# Patient Record
Sex: Male | Born: 1935 | Race: White | Hispanic: No | Marital: Married | Smoking: Former smoker
Health system: Southern US, Community
[De-identification: ages and names within clinical notes are randomized; demographics above are authoritative.]

## PROBLEM LIST (undated history)

## (undated) DIAGNOSIS — G4733 Obstructive sleep apnea (adult) (pediatric): Secondary | ICD-10-CM

## (undated) DIAGNOSIS — C8590 Non-Hodgkin lymphoma, unspecified, unspecified site: Secondary | ICD-10-CM

## (undated) DIAGNOSIS — G4730 Sleep apnea, unspecified: Secondary | ICD-10-CM

## (undated) DIAGNOSIS — H8110 Benign paroxysmal vertigo, unspecified ear: Secondary | ICD-10-CM

## (undated) DIAGNOSIS — N35919 Unspecified urethral stricture, male, unspecified site: Secondary | ICD-10-CM

## (undated) DIAGNOSIS — E119 Type 2 diabetes mellitus without complications: Secondary | ICD-10-CM

## (undated) DIAGNOSIS — F419 Anxiety disorder, unspecified: Secondary | ICD-10-CM

## (undated) DIAGNOSIS — R590 Localized enlarged lymph nodes: Secondary | ICD-10-CM

## (undated) DIAGNOSIS — D649 Anemia, unspecified: Secondary | ICD-10-CM

## (undated) DIAGNOSIS — E785 Hyperlipidemia, unspecified: Secondary | ICD-10-CM

## (undated) DIAGNOSIS — I10 Essential (primary) hypertension: Secondary | ICD-10-CM

## (undated) HISTORY — PX: PROSTATE SURGERY: SHX751

## (undated) HISTORY — PX: TONSILLECTOMY: SUR1361

## (undated) HISTORY — PX: BACK SURGERY: SHX140

---

## 2011-02-15 ENCOUNTER — Encounter: Payer: Self-pay | Admitting: *Deleted

## 2011-02-15 ENCOUNTER — Emergency Department (HOSPITAL_BASED_OUTPATIENT_CLINIC_OR_DEPARTMENT_OTHER)
Admission: EM | Admit: 2011-02-15 | Discharge: 2011-02-15 | Disposition: A | Discharge status: Home / Self Care | Payer: Medicare Other | Attending: Emergency Medicine | Admitting: Emergency Medicine

## 2011-02-15 DIAGNOSIS — F411 Generalized anxiety disorder: Secondary | ICD-10-CM | POA: Insufficient documentation

## 2011-02-15 DIAGNOSIS — I10 Essential (primary) hypertension: Secondary | ICD-10-CM | POA: Insufficient documentation

## 2011-02-15 DIAGNOSIS — N390 Urinary tract infection, site not specified: Secondary | ICD-10-CM | POA: Insufficient documentation

## 2011-02-15 DIAGNOSIS — E785 Hyperlipidemia, unspecified: Secondary | ICD-10-CM | POA: Insufficient documentation

## 2011-02-15 DIAGNOSIS — R300 Dysuria: Secondary | ICD-10-CM | POA: Insufficient documentation

## 2011-02-15 DIAGNOSIS — Z79899 Other long term (current) drug therapy: Secondary | ICD-10-CM | POA: Insufficient documentation

## 2011-02-15 HISTORY — DX: Hyperlipidemia, unspecified: E78.5

## 2011-02-15 HISTORY — DX: Anxiety disorder, unspecified: F41.9

## 2011-02-15 HISTORY — DX: Essential (primary) hypertension: I10

## 2011-02-15 LAB — URINE MICROSCOPIC-ADD ON

## 2011-02-15 LAB — URINALYSIS, ROUTINE W REFLEX MICROSCOPIC
Glucose, UA: NEGATIVE mg/dL
Ketones, ur: NEGATIVE mg/dL
Nitrite: POSITIVE — AB
Specific Gravity, Urine: 1.021 (ref 1.005–1.030)
pH: 5.5 (ref 5.0–8.0)

## 2011-02-15 MED ORDER — CIPROFLOXACIN HCL 500 MG PO TABS: 500.0000 mg | ORAL_TABLET | Freq: Two times a day (BID) | ORAL | Status: AC

## 2011-02-15 MED ORDER — CIPROFLOXACIN HCL 500 MG PO TABS
500.0000 mg | ORAL_TABLET | Freq: Once | ORAL | Status: AC
  Administered 2011-02-15: 500 mg via ORAL
  Filled 2011-02-15: qty 1

## 2011-02-15 NOTE — ED Notes (Signed)
Pt c/o painful urination.  

## 2011-02-15 NOTE — ED Provider Notes (Signed)
History     CSN: [id number] Arrival date & time: 02/15/2011 10:22 PM   First MD Initiated Contact with Patient 02/15/11 2243      Chief Complaint  Patient presents with  . Dysuria    (Consider location/radiation/quality/duration/timing/severity/associated sxs/prior treatment) HPI This is an 77 year old white male with a one-day history of burning with urination. He said he has some mild symptoms yesterday but they worsened today. He had a fever to 124 this afternoon which was relieved with an over-the-counter antipyretic the symptoms are moderate to severe. He has a history of urinary tract infections in the past but with the use of decreased in frequency following prostate surgery. He has had chills but no nausea, vomiting, diarrhea, flank pain, back pain or mental status changes.  Past Medical History  Diagnosis Date  . Hypertension   . Hyperlipemia   . Anxiety     Past Surgical History  Procedure Date  . Back surgery   . Prostate surgery   . Tonsillectomy     History reviewed. No pertinent family history.  History  Substance Use Topics  . Smoking status: Never Smoker   . Smokeless tobacco: Not on file  . Alcohol Use: No      Review of Systems  All other systems reviewed and are negative.    Allergies  Caffeine; Morphine and related; and Penicillins  Home Medications   Current Outpatient Rx  Name Route Sig Dispense Refill  . AMLODIPINE BESYLATE 10 MG PO TABS Oral Take 5 mg by mouth daily.      . ASPIRIN 81 MG PO TABS Oral Take 81 mg by mouth daily.      Marland Kitchen CLORAZEPATE DIPOTASSIUM 7.5 MG PO TABS Oral Take 7.5 mg by mouth daily.      Marland Kitchen LISINOPRIL 20 MG PO TABS Oral Take 20 mg by mouth daily.      Marland Kitchen METOPROLOL TARTRATE 50 MG PO TABS Oral Take 25 mg by mouth 2 (two) times daily.      Carma Leaven M PLUS PO TABS Oral Take 1 tablet by mouth daily.      . NYSTATIN 100000 UNIT/GM EX CREA Topical Apply 1 application topically 2 (two) times daily as needed. For jock  itch      . SIMVASTATIN PO Oral Take 1 tablet by mouth daily.        BP 130/57  Pulse 78  Temp(Src) 99 F (37.2 C) (Oral)  Resp 18  Ht 5\' 7"  (1.702 m)  Wt 180 lb (81.647 kg)  BMI 28.19 kg/m2  SpO2 100%  Physical Exam General: Well-developed, well-nourished male in no acute distress; appears younger than age of record HENT: normocephalic, atraumatic Eyes: pupils equal round and reactive to light; extraocular muscles intact Neck: supple Heart: regular rate and rhythm Lungs: clear to auscultation bilaterally Abdomen: soft; nontender; nondistended;  bowel sounds present GU: no CVA tenderness Extremities: No deformity; full range of motion; pulses normal; no edema Neurologic: Awake, alert and oriented; motor function intact in all extremities and symmetric; no facial droop Skin: Warm and dry Psychiatric: Normal mood and affect    ED Course  Procedures (including critical care time)     MDM   Nursing notes and vitals signs, including pulse oximetry, reviewed.  Summary of this visit's results, reviewed by myself:  Labs:  Results for orders placed during the hospital encounter of 02/15/11  URINALYSIS, ROUTINE W REFLEX MICROSCOPIC      Component Value Range   Color, Urine AMBER (*)  YELLOW    Appearance TURBID (*) CLEAR    Specific Gravity, Urine 1.021  1.005 - 1.030    pH 5.5  5.0 - 8.0    Glucose, UA NEGATIVE  NEGATIVE (mg/dL)   Hgb urine dipstick LARGE (*) NEGATIVE    Bilirubin Urine NEGATIVE  NEGATIVE    Ketones, ur NEGATIVE  NEGATIVE (mg/dL)   Protein, ur 161 (*) NEGATIVE (mg/dL)   Urobilinogen, UA 0.2  0.0 - 1.0 (mg/dL)   Nitrite POSITIVE (*) NEGATIVE    Leukocytes, UA LARGE (*) NEGATIVE   URINE MICROSCOPIC-ADD ON      Component Value Range   Squamous Epithelial / LPF FEW (*) RARE    WBC, UA TOO NUMEROUS TO COUNT  <3 (WBC/hpf)   RBC / HPF TOO NUMEROUS TO COUNT  <3 (RBC/hpf)   Bacteria, UA MANY (*) RARE              Hanley Seamen, MD 02/15/11  [phone]

## 2011-02-18 LAB — URINE CULTURE: Culture  Setup Time: 201211030637

## 2011-02-19 NOTE — ED Notes (Signed)
+   Urine Patient treated with Cipro-sensitive to same-chart appended per protocol MD. 

## 2014-01-05 ENCOUNTER — Encounter (HOSPITAL_COMMUNITY): Payer: Self-pay | Admitting: Emergency Medicine

## 2014-01-05 ENCOUNTER — Emergency Department (HOSPITAL_COMMUNITY): Payer: Medicare Other

## 2014-01-05 ENCOUNTER — Inpatient Hospital Stay (HOSPITAL_COMMUNITY)
Admission: EM | Admit: 2014-01-05 | Discharge: 2014-01-12 | DRG: 193 | Disposition: A | Discharge status: Home Health | Payer: Medicare Other | Attending: Internal Medicine | Admitting: Internal Medicine

## 2014-01-05 DIAGNOSIS — T380X5A Adverse effect of glucocorticoids and synthetic analogues, initial encounter: Secondary | ICD-10-CM | POA: Diagnosis not present

## 2014-01-05 DIAGNOSIS — E883 Tumor lysis syndrome: Secondary | ICD-10-CM | POA: Diagnosis not present

## 2014-01-05 DIAGNOSIS — N058 Unspecified nephritic syndrome with other morphologic changes: Secondary | ICD-10-CM | POA: Diagnosis present

## 2014-01-05 DIAGNOSIS — C8589 Other specified types of non-Hodgkin lymphoma, extranodal and solid organ sites: Secondary | ICD-10-CM | POA: Diagnosis present

## 2014-01-05 DIAGNOSIS — Z7982 Long term (current) use of aspirin: Secondary | ICD-10-CM

## 2014-01-05 DIAGNOSIS — R7402 Elevation of levels of lactic acid dehydrogenase (LDH): Secondary | ICD-10-CM | POA: Diagnosis present

## 2014-01-05 DIAGNOSIS — R740 Nonspecific elevation of levels of transaminase and lactic acid dehydrogenase [LDH]: Secondary | ICD-10-CM

## 2014-01-05 DIAGNOSIS — E785 Hyperlipidemia, unspecified: Secondary | ICD-10-CM | POA: Diagnosis present

## 2014-01-05 DIAGNOSIS — N183 Chronic kidney disease, stage 3 unspecified: Secondary | ICD-10-CM | POA: Diagnosis present

## 2014-01-05 DIAGNOSIS — M7989 Other specified soft tissue disorders: Secondary | ICD-10-CM | POA: Diagnosis present

## 2014-01-05 DIAGNOSIS — D72829 Elevated white blood cell count, unspecified: Secondary | ICD-10-CM | POA: Diagnosis present

## 2014-01-05 DIAGNOSIS — E119 Type 2 diabetes mellitus without complications: Secondary | ICD-10-CM

## 2014-01-05 DIAGNOSIS — D6959 Other secondary thrombocytopenia: Secondary | ICD-10-CM | POA: Diagnosis present

## 2014-01-05 DIAGNOSIS — J90 Pleural effusion, not elsewhere classified: Secondary | ICD-10-CM | POA: Diagnosis present

## 2014-01-05 DIAGNOSIS — E875 Hyperkalemia: Secondary | ICD-10-CM | POA: Diagnosis present

## 2014-01-05 DIAGNOSIS — N35919 Unspecified urethral stricture, male, unspecified site: Secondary | ICD-10-CM | POA: Diagnosis present

## 2014-01-05 DIAGNOSIS — F411 Generalized anxiety disorder: Secondary | ICD-10-CM | POA: Diagnosis present

## 2014-01-05 DIAGNOSIS — Z79899 Other long term (current) drug therapy: Secondary | ICD-10-CM | POA: Diagnosis not present

## 2014-01-05 DIAGNOSIS — G4733 Obstructive sleep apnea (adult) (pediatric): Secondary | ICD-10-CM | POA: Diagnosis present

## 2014-01-05 DIAGNOSIS — J9601 Acute respiratory failure with hypoxia: Secondary | ICD-10-CM | POA: Diagnosis present

## 2014-01-05 DIAGNOSIS — R634 Abnormal weight loss: Secondary | ICD-10-CM | POA: Diagnosis present

## 2014-01-05 DIAGNOSIS — T8089XA Other complications following infusion, transfusion and therapeutic injection, initial encounter: Secondary | ICD-10-CM | POA: Diagnosis not present

## 2014-01-05 DIAGNOSIS — R0902 Hypoxemia: Secondary | ICD-10-CM

## 2014-01-05 DIAGNOSIS — C8590 Non-Hodgkin lymphoma, unspecified, unspecified site: Secondary | ICD-10-CM

## 2014-01-05 DIAGNOSIS — R7401 Elevation of levels of liver transaminase levels: Secondary | ICD-10-CM | POA: Diagnosis present

## 2014-01-05 DIAGNOSIS — J189 Pneumonia, unspecified organism: Secondary | ICD-10-CM | POA: Diagnosis present

## 2014-01-05 DIAGNOSIS — Y849 Medical procedure, unspecified as the cause of abnormal reaction of the patient, or of later complication, without mention of misadventure at the time of the procedure: Secondary | ICD-10-CM | POA: Diagnosis not present

## 2014-01-05 DIAGNOSIS — I129 Hypertensive chronic kidney disease with stage 1 through stage 4 chronic kidney disease, or unspecified chronic kidney disease: Secondary | ICD-10-CM | POA: Diagnosis present

## 2014-01-05 DIAGNOSIS — E1129 Type 2 diabetes mellitus with other diabetic kidney complication: Secondary | ICD-10-CM | POA: Diagnosis present

## 2014-01-05 DIAGNOSIS — J9600 Acute respiratory failure, unspecified whether with hypoxia or hypercapnia: Secondary | ICD-10-CM | POA: Diagnosis present

## 2014-01-05 DIAGNOSIS — I4891 Unspecified atrial fibrillation: Secondary | ICD-10-CM | POA: Diagnosis present

## 2014-01-05 DIAGNOSIS — C8330 Diffuse large B-cell lymphoma, unspecified site: Secondary | ICD-10-CM | POA: Diagnosis present

## 2014-01-05 DIAGNOSIS — N179 Acute kidney failure, unspecified: Secondary | ICD-10-CM | POA: Diagnosis present

## 2014-01-05 DIAGNOSIS — Z6829 Body mass index (BMI) 29.0-29.9, adult: Secondary | ICD-10-CM

## 2014-01-05 DIAGNOSIS — R0602 Shortness of breath: Secondary | ICD-10-CM | POA: Diagnosis present

## 2014-01-05 DIAGNOSIS — I10 Essential (primary) hypertension: Secondary | ICD-10-CM

## 2014-01-05 HISTORY — DX: Unspecified urethral stricture, male, unspecified site: N35.919

## 2014-01-05 HISTORY — DX: Type 2 diabetes mellitus without complications: E11.9

## 2014-01-05 HISTORY — DX: Anemia, unspecified: D64.9

## 2014-01-05 HISTORY — DX: Localized enlarged lymph nodes: R59.0

## 2014-01-05 HISTORY — DX: Obstructive sleep apnea (adult) (pediatric): G47.33

## 2014-01-05 HISTORY — DX: Benign paroxysmal vertigo, unspecified ear: H81.10

## 2014-01-05 HISTORY — DX: Non-Hodgkin lymphoma, unspecified, unspecified site: C85.90

## 2014-01-05 LAB — COMPREHENSIVE METABOLIC PANEL
ALK PHOS: 100 U/L (ref 39–117)
ALT: 20 U/L (ref 0–53)
ANION GAP: 13 (ref 5–15)
AST: 75 U/L — ABNORMAL HIGH (ref 0–37)
Albumin: 2.3 g/dL — ABNORMAL LOW (ref 3.5–5.2)
BILIRUBIN TOTAL: 0.4 mg/dL (ref 0.3–1.2)
BUN: 38 mg/dL — AB (ref 6–23)
CO2: 23 meq/L (ref 19–32)
Calcium: 8.4 mg/dL (ref 8.4–10.5)
Chloride: 101 mEq/L (ref 96–112)
Creatinine, Ser: 2.23 mg/dL — ABNORMAL HIGH (ref 0.50–1.35)
GFR, EST AFRICAN AMERICAN: 29 mL/min — AB (ref >90)
GFR, EST NON AFRICAN AMERICAN: 25 mL/min — AB (ref >90)
GLUCOSE: 135 mg/dL — AB (ref 70–99)
POTASSIUM: 5.5 meq/L — AB (ref 3.7–5.3)
Sodium: 137 mEq/L (ref 137–147)
TOTAL PROTEIN: 6 g/dL (ref 6.0–8.3)

## 2014-01-05 LAB — CBC WITH DIFFERENTIAL/PLATELET
BASOS ABS: 0.2 10*3/uL — AB (ref 0.0–0.1)
Basophils Relative: 1 % (ref 0–1)
EOS PCT: 1 % (ref 0–5)
Eosinophils Absolute: 0.2 10*3/uL (ref 0.0–0.7)
HCT: 35.3 % — ABNORMAL LOW (ref 39.0–52.0)
Hemoglobin: 11.8 g/dL — ABNORMAL LOW (ref 13.0–17.0)
LYMPHS ABS: 7.3 10*3/uL — AB (ref 0.7–4.0)
Lymphocytes Relative: 35 % (ref 12–46)
MCH: 29.4 pg (ref 26.0–34.0)
MCHC: 33.4 g/dL (ref 30.0–36.0)
MCV: 87.8 fL (ref 78.0–100.0)
MONO ABS: 1.7 10*3/uL — AB (ref 0.1–1.0)
Monocytes Relative: 8 % (ref 3–12)
Neutro Abs: 11.5 10*3/uL — ABNORMAL HIGH (ref 1.7–7.7)
Neutrophils Relative %: 55 % (ref 43–77)
PLATELETS: 156 10*3/uL (ref 150–400)
RBC: 4.02 MIL/uL — ABNORMAL LOW (ref 4.22–5.81)
RDW: 15 % (ref 11.5–15.5)
WBC: 20.9 10*3/uL — ABNORMAL HIGH (ref 4.0–10.5)

## 2014-01-05 LAB — PRO B NATRIURETIC PEPTIDE: PRO B NATRI PEPTIDE: 343.7 pg/mL (ref 0–450)

## 2014-01-05 LAB — APTT: aPTT: 26 seconds (ref 24–37)

## 2014-01-05 LAB — BLOOD GAS, ARTERIAL
ACID-BASE EXCESS: 0.4 mmol/L (ref 0.0–2.0)
BICARBONATE: 24.5 meq/L — AB (ref 20.0–24.0)
Drawn by: 232811
FIO2: 0.21 %
O2 Saturation: 87.2 %
PCO2 ART: 40 mmHg (ref 35.0–45.0)
PO2 ART: 54.2 mmHg — AB (ref 80.0–100.0)
Patient temperature: 98.6
TCO2: 22.3 mmol/L (ref 0–100)
pH, Arterial: 7.405 (ref 7.350–7.450)

## 2014-01-05 LAB — PROTIME-INR
INR: 1.17 (ref 0.00–1.49)
Prothrombin Time: 14.9 seconds (ref 11.6–15.2)

## 2014-01-05 NOTE — ED Provider Notes (Signed)
CSN: [id number]     Arrival date & time 01/05/14  1952 History   First MD Initiated Contact with Patient 01/05/14 2014     Chief Complaint  Patient presents with  . Weakness  . Shortness of Breath     (Consider location/radiation/quality/duration/timing/severity/associated sxs/prior Treatment) HPI Patient reports he was having abdominal pain. During the course of his evaluation he had a CT of his abdomen  done in [facility] and he reports he had lymphadenopathy in his abdomen and also in his chest. He had a biopsy done of the lymph nodes in his abdomen which showed he has diffuse large B cell lymphoma. He reports he just got his diagnosis yesterday. He reports having night sweats,  progressive weakness to the point he is having trouble walking, dyspnea on exertion and  even dyspnea at rest. He has had decreased appetite. He has had abdominal distention. He denies nausea, vomiting, or diarrhea. He has a mild cough and did cough up some yellow mucus today once. He had a fever last week up to 100.2, he denies chills. He denies dysuria. He states he does have trouble starting to urinate. He had a TURP done in 2001. Patient states he has his first oncology appointment tomorrow with Dr. Alvy Bimler    PCP Dr Shirlean Schlein in [facility]  Past Medical History  Diagnosis Date  . Hypertension   . Hyperlipemia   . Anxiety   . Lymphadenopathy, abdominal   . Urethral stricture   . Anemia   . BPPV (benign paroxysmal positional vertigo)   . Lymphoma   . Diabetes mellitus without complication   . Obstructive sleep apnea    Past Surgical History  Procedure Laterality Date  . Back surgery    . Prostate surgery    . Tonsillectomy     History reviewed. No pertinent family history. History  Substance Use Topics  . Smoking status: Never Smoker   . Smokeless tobacco: Not on file  . Alcohol Use: No  lives at home Lives with spouse  Review of Systems  All other systems reviewed and are  negative.     Allergies  Caffeine; Morphine and related; and Penicillins  Home Medications   Prior to Admission medications   Medication Sig Start Date End Date Taking? Authorizing Provider  amLODipine (NORVASC) 10 MG tablet Take 5 mg by mouth daily.     Yes Historical Provider, MD  aspirin 81 MG tablet Take 81 mg by mouth daily.     Yes Historical Provider, MD  clorazepate (TRANXENE) 7.5 MG tablet Take 7.5 mg by mouth 2 (two) times daily as needed for anxiety or sleep.   Yes Historical Provider, MD  clotrimazole-betamethasone (LOTRISONE) cream Apply 1 application topically 2 (two) times daily.   Yes Historical Provider, MD  lisinopril (PRINIVIL,ZESTRIL) 20 MG tablet Take 20 mg by mouth daily.     Yes Historical Provider, MD  metoprolol (LOPRESSOR) 50 MG tablet Take 25 mg by mouth 2 (two) times daily.     Yes Historical Provider, MD  simvastatin (ZOCOR) 20 MG tablet Take 20 mg by mouth daily.   Yes Historical Provider, MD  traMADol (ULTRAM) 50 MG tablet Take 50 mg by mouth every 6 (six) hours as needed for moderate pain.   Yes Historical Provider, MD   BP 106/53  Pulse 85  Temp(Src) 98.2 F (36.8 C) (Oral)  Resp 19  Ht 5\' 7"  (1.702 m)  Wt 188 lb (85.276 kg)  BMI 29.44 kg/m2  SpO2 90%  Vital signs normal   Physical Exam  Nursing note and vitals reviewed. Constitutional: He is oriented to person, place, and time. He appears well-developed and well-nourished.  Non-toxic appearance. He does not appear ill. No distress.  HENT:  Head: Normocephalic and atraumatic.  Right Ear: External ear normal.  Left Ear: External ear normal.  Nose: Nose normal. No mucosal edema or rhinorrhea.  Mouth/Throat: Oropharynx is clear and moist and mucous membranes are normal. No dental abscesses or uvula swelling.  Eyes: Conjunctivae and EOM are normal. Pupils are equal, round, and reactive to light.  Neck: Normal range of motion and full passive range of motion without pain. Neck supple.   Cardiovascular: Normal rate, regular rhythm and normal heart sounds.  Exam reveals no gallop and no friction rub.   No murmur heard. Pulmonary/Chest: He is in respiratory distress. He has decreased breath sounds. He has no wheezes. He has no rhonchi. He has no rales. He exhibits no tenderness and no crepitus.  Patient's pulse ox varies from 89-91% while talking and at rest  Abdominal: Soft. Normal appearance and bowel sounds are normal. He exhibits distension. There is no tenderness. There is no rebound and no guarding.  Musculoskeletal: Normal range of motion. He exhibits no edema and no tenderness.  Moves all extremities well.   Neurological: He is alert and oriented to person, place, and time. He has normal strength. No cranial nerve deficit.  Skin: Skin is warm, dry and intact. No rash noted. No erythema. No pallor.  Psychiatric: He has a normal mood and affect. His speech is normal and behavior is normal. His mood appears not anxious.    ED Course  Procedures (including critical care time)  Medications - No data to display   D/W Radiology, he is able to see the AP CT from 8/31. States no need to repeat the AP CT, we were going to do CT angio chest but patient now has renal insufficiency. He was able to have contrast with his CT done on the 31st so the renal insuffiencey must be new. CT scan of chest without contrast ordered.   Pt placed on [place] oxygen 2 lpm [place].   23:28 Dr Posey Pronto, admit to tele, team 8  Pt and family given test results. Pt is feeling better on [place] oxygen. They are unaware of his renal insufficiency, but were aware of him having anemia. His son has a problem list that doesn't list renal insuffiencey.   Labs Review Results for orders placed during the hospital encounter of 01/05/14  CBC WITH DIFFERENTIAL      Result Value Ref Range   WBC 20.9 (*) 4.0 - 10.5 K/uL   RBC 4.02 (*) 4.22 - 5.81 MIL/uL   Hemoglobin 11.8 (*) 13.0 - 17.0 g/dL   HCT 35.3 (*) 39.0 - 52.0 %    MCV 87.8  78.0 - 100.0 fL   MCH 29.4  26.0 - 34.0 pg   MCHC 33.4  30.0 - 36.0 g/dL   RDW 15.0  11.5 - 15.5 %   Platelets 156  150 - 400 K/uL   Neutrophils Relative % 55  43 - 77 %   Lymphocytes Relative 35  12 - 46 %   Monocytes Relative 8  3 - 12 %   Eosinophils Relative 1  0 - 5 %   Basophils Relative 1  0 - 1 %   Neutro Abs 11.5 (*) 1.7 - 7.7 K/uL   Lymphs Abs 7.3 (*) 0.7 - 4.0  K/uL   Monocytes Absolute 1.7 (*) 0.1 - 1.0 K/uL   Eosinophils Absolute 0.2  0.0 - 0.7 K/uL   Basophils Absolute 0.2 (*) 0.0 - 0.1 K/uL   RBC Morphology POLYCHROMASIA PRESENT     WBC Morphology VACUOLATED NEUTROPHILS     Smear Review LARGE PLATELETS PRESENT    COMPREHENSIVE METABOLIC PANEL      Result Value Ref Range   Sodium 137  137 - 147 mEq/L   Potassium 5.5 (*) 3.7 - 5.3 mEq/L   Chloride 101  96 - 112 mEq/L   CO2 23  19 - 32 mEq/L   Glucose, Bld 135 (*) 70 - 99 mg/dL   BUN 38 (*) 6 - 23 mg/dL   Creatinine, Ser 2.23 (*) 0.50 - 1.35 mg/dL   Calcium 8.4  8.4 - 10.5 mg/dL   Total Protein 6.0  6.0 - 8.3 g/dL   Albumin 2.3 (*) 3.5 - 5.2 g/dL   AST 75 (*) 0 - 37 U/L   ALT 20  0 - 53 U/L   Alkaline Phosphatase 100  39 - 117 U/L   Total Bilirubin 0.4  0.3 - 1.2 mg/dL   GFR calc non Af Amer 25 (*) >90 mL/min   GFR calc Af Amer 29 (*) >90 mL/min   Anion gap 13  5 - 15  APTT      Result Value Ref Range   aPTT 26  24 - 37 seconds  PROTIME-INR      Result Value Ref Range   Prothrombin Time 14.9  11.6 - 15.2 seconds   INR 1.17  0.00 - 1.49  BLOOD GAS, ARTERIAL      Result Value Ref Range   FIO2 0.21     Delivery systems ROOM AIR     pH, Arterial 7.405  7.350 - 7.450   pCO2 arterial 40.0  35.0 - 45.0 mmHg   pO2, Arterial 54.2 (*) 80.0 - 100.0 mmHg   Bicarbonate 24.5 (*) 20.0 - 24.0 mEq/L   TCO2 22.3  0 - 100 mmol/L   Acid-Base Excess 0.4  0.0 - 2.0 mmol/L   O2 Saturation 87.2     Patient temperature 98.6     Collection site RIGHT RADIAL     Drawn by [id number]     Sample type ARTERIAL     Allens  test (pass/fail) PASS  PASS   Laboratory interpretation all normal except hypoxia on his blood gas on room air, renal insufficiency with mild hyperkalemia, leukocytosis     Imaging Review Dg Chest 2 View  01/05/2014   CLINICAL DATA:  Shortness of breath.  EXAM: CHEST  2 VIEW  COMPARISON:  None available.  FINDINGS: The cardiac silhouette, mediastinal and hilar contours are somewhat prominent. The upper mediastinum is slightly widened and the right pulmonary hila is prominent. There are small bilateral pleural effusions and overlying atelectasis. No edema or pneumothorax. The bony thorax is grossly intact.  IMPRESSION: Prominent superior mediastinal contours and prominent right hilum. Chest CT may be helpful for further evaluation.  Bilateral pleural effusions and bibasilar atelectasis.   Electronically Signed   By: Kalman Jewels M.D.   On: 01/05/2014 22:32   Ct Chest Wo Contrast  01/05/2014   CLINICAL DATA:  Chest pain.  Recent diagnosis of lymphoma.  EXAM: CT CHEST WITHOUT CONTRAST  TECHNIQUE: Multidetector CT imaging of the chest was performed following the standard protocol without IV contrast.  COMPARISON:  None.  FINDINGS: THORACIC INLET/BODY WALL:  Adenopathy at  the thoracic inlet, with a node posterior to the left IJ measuring 15 mm short axis. Bilateral axillary lymphadenopathy, with a solid node on the left measuring 22 mm in short axis on image 18.  MEDIASTINUM:  No cardiomegaly or pericardial effusion. Diffuse mediastinal lymphadenopathy, present in all stations. Index node in the sub- carina region measures 28 mm short axis, larger than on abdominal imaging. Index left anterior juxtadiaphragmatic node is also larger at 14 mm (previously 10 mm). Bilateral hilar lymphadenopathy. Negative esophagus. Diffuse atherosclerosis, including the coronary arteries. No evidence of acute vascular finding.  LUNG WINDOWS:  Small, layering water density pleural effusions bilaterally. There is atelectasis in  both lower lobes.  7 mm nodule in the right middle lobe. This will be re-assesed at surveillance imaging.  Spiculated density at the right apex favors pleural/parenchymal scarring.  No evidence of edema, pneumothorax, or pneumonia.  UPPER ABDOMEN:  Diffuse adenopathy in the upper abdominal ligaments. No definitive progression since recent abdominal CT. The spleen is partially imaged but clearly enlarged.  OSSEOUS:  No acute fracture.  No suspicious lytic or blastic lesions.  IMPRESSION: 1. Diffuse lymphoma, with intrathoracic progression since 12/13/2013. 2. Small bilateral pleural effusions with lower lobe atelectasis.   Electronically Signed   By: [name] M.D.   On: 01/05/2014 23:03     EKG Interpretation   Date/Time:  Wednesday January 05 2014 20:22:00 EDT Ventricular Rate:  76 PR Interval:  153 QRS Duration: 86 QT Interval:  377 QTC Calculation: 424 R Axis:   6 Text Interpretation:  Sinus rhythm Baseline wander in lead(s) III aVF  Otherwise within normal limits No old tracing to compare Confirmed by  Julez Huseby  MD-I, Shantel Wesely ([id number]) on 01/05/2014 10:55:03 PM      MDM   Final diagnoses:  Hypoxia  Pleural effusion  Lymphoma    Plan admission   Rolland Porter, MD, Alanson Aly, MD 01/05/14 2354

## 2014-01-05 NOTE — ED Notes (Signed)
Pt presents with generalized weakness  Pt is a cancer pt and family states he has become progressively more weak  Decreased appetite  Pt has been becoming more short of breath over the past week

## 2014-01-06 ENCOUNTER — Inpatient Hospital Stay (HOSPITAL_COMMUNITY): Payer: Medicare Other

## 2014-01-06 DIAGNOSIS — I4891 Unspecified atrial fibrillation: Secondary | ICD-10-CM | POA: Diagnosis present

## 2014-01-06 DIAGNOSIS — R0602 Shortness of breath: Secondary | ICD-10-CM

## 2014-01-06 DIAGNOSIS — J9600 Acute respiratory failure, unspecified whether with hypoxia or hypercapnia: Secondary | ICD-10-CM

## 2014-01-06 DIAGNOSIS — M7989 Other specified soft tissue disorders: Secondary | ICD-10-CM

## 2014-01-06 DIAGNOSIS — I369 Nonrheumatic tricuspid valve disorder, unspecified: Secondary | ICD-10-CM

## 2014-01-06 DIAGNOSIS — N35919 Unspecified urethral stricture, male, unspecified site: Secondary | ICD-10-CM | POA: Diagnosis present

## 2014-01-06 DIAGNOSIS — E119 Type 2 diabetes mellitus without complications: Secondary | ICD-10-CM | POA: Diagnosis present

## 2014-01-06 DIAGNOSIS — I10 Essential (primary) hypertension: Secondary | ICD-10-CM | POA: Diagnosis present

## 2014-01-06 DIAGNOSIS — C8330 Diffuse large B-cell lymphoma, unspecified site: Secondary | ICD-10-CM | POA: Diagnosis present

## 2014-01-06 DIAGNOSIS — N183 Chronic kidney disease, stage 3 unspecified: Secondary | ICD-10-CM | POA: Diagnosis present

## 2014-01-06 DIAGNOSIS — G4733 Obstructive sleep apnea (adult) (pediatric): Secondary | ICD-10-CM | POA: Diagnosis present

## 2014-01-06 DIAGNOSIS — J9601 Acute respiratory failure with hypoxia: Secondary | ICD-10-CM | POA: Diagnosis present

## 2014-01-06 LAB — COMPREHENSIVE METABOLIC PANEL
ALBUMIN: 2.1 g/dL — AB (ref 3.5–5.2)
ALK PHOS: 99 U/L (ref 39–117)
ALT: 18 U/L (ref 0–53)
AST: 66 U/L — ABNORMAL HIGH (ref 0–37)
Anion gap: 12 (ref 5–15)
BUN: 41 mg/dL — ABNORMAL HIGH (ref 6–23)
CO2: 23 mEq/L (ref 19–32)
Calcium: 8.1 mg/dL — ABNORMAL LOW (ref 8.4–10.5)
Chloride: 104 mEq/L (ref 96–112)
Creatinine, Ser: 2.14 mg/dL — ABNORMAL HIGH (ref 0.50–1.35)
GFR calc Af Amer: 31 mL/min — ABNORMAL LOW (ref >90)
GFR calc non Af Amer: 26 mL/min — ABNORMAL LOW (ref >90)
Glucose, Bld: 95 mg/dL (ref 70–99)
POTASSIUM: 4.6 meq/L (ref 3.7–5.3)
Sodium: 139 mEq/L (ref 137–147)
TOTAL PROTEIN: 5.8 g/dL — AB (ref 6.0–8.3)
Total Bilirubin: 0.4 mg/dL (ref 0.3–1.2)

## 2014-01-06 LAB — CBC WITH DIFFERENTIAL/PLATELET
Basophils Absolute: 0.2 10*3/uL — ABNORMAL HIGH (ref 0.0–0.1)
Basophils Relative: 1 % (ref 0–1)
EOS ABS: 0.4 10*3/uL (ref 0.0–0.7)
Eosinophils Relative: 2 % (ref 0–5)
HCT: 33.1 % — ABNORMAL LOW (ref 39.0–52.0)
Hemoglobin: 11.1 g/dL — ABNORMAL LOW (ref 13.0–17.0)
Lymphocytes Relative: 42 % (ref 12–46)
Lymphs Abs: 8.7 10*3/uL — ABNORMAL HIGH (ref 0.7–4.0)
MCH: 28.8 pg (ref 26.0–34.0)
MCHC: 33.5 g/dL (ref 30.0–36.0)
MCV: 86 fL (ref 78.0–100.0)
Monocytes Absolute: 2.3 10*3/uL — ABNORMAL HIGH (ref 0.1–1.0)
Monocytes Relative: 11 % (ref 3–12)
NEUTROS PCT: 44 % (ref 43–77)
Neutro Abs: 9 10*3/uL — ABNORMAL HIGH (ref 1.7–7.7)
PLATELETS: 160 10*3/uL (ref 150–400)
RBC: 3.85 MIL/uL — ABNORMAL LOW (ref 4.22–5.81)
RDW: 15.1 % (ref 11.5–15.5)
WBC: 20.6 10*3/uL — ABNORMAL HIGH (ref 4.0–10.5)

## 2014-01-06 LAB — PROTIME-INR
INR: 1.15 (ref 0.00–1.49)
Prothrombin Time: 14.7 seconds (ref 11.6–15.2)

## 2014-01-06 LAB — TROPONIN I

## 2014-01-06 LAB — PATHOLOGIST SMEAR REVIEW

## 2014-01-06 LAB — STREP PNEUMONIAE URINARY ANTIGEN: STREP PNEUMO URINARY ANTIGEN: NEGATIVE

## 2014-01-06 LAB — HEMOGLOBIN A1C
HEMOGLOBIN A1C: 6.4 % — AB (ref <5.7)
MEAN PLASMA GLUCOSE: 137 mg/dL — AB (ref <117)

## 2014-01-06 MED ORDER — TECHNETIUM TO 99M ALBUMIN AGGREGATED
5.0000 | Freq: Once | INTRAVENOUS | Status: AC | PRN
  Administered 2014-01-06: 5 via INTRAVENOUS

## 2014-01-06 MED ORDER — TRAMADOL HCL 50 MG PO TABS
50.0000 mg | ORAL_TABLET | Freq: Four times a day (QID) | ORAL | Status: DC | PRN
  Administered 2014-01-06 – 2014-01-11 (×2): 50 mg via ORAL
  Filled 2014-01-06 (×2): qty 1

## 2014-01-06 MED ORDER — ENSURE COMPLETE PO LIQD: 237.0000 mL | ORAL | Status: DC

## 2014-01-06 MED ORDER — ACETAMINOPHEN 325 MG PO TABS: 650.0000 mg | ORAL_TABLET | Freq: Four times a day (QID) | ORAL | Status: DC | PRN

## 2014-01-06 MED ORDER — CHLORHEXIDINE GLUCONATE 0.12 % MT SOLN
15.0000 mL | Freq: Two times a day (BID) | OROMUCOSAL | Status: DC
  Administered 2014-01-06 – 2014-01-12 (×13): 15 mL via OROMUCOSAL
  Filled 2014-01-06 (×18): qty 15

## 2014-01-06 MED ORDER — ASPIRIN 81 MG PO CHEW
81.0000 mg | CHEWABLE_TABLET | Freq: Every day | ORAL | Status: DC
  Administered 2014-01-06 – 2014-01-07 (×2): 81 mg via ORAL
  Filled 2014-01-06 (×2): qty 1

## 2014-01-06 MED ORDER — DILTIAZEM LOAD VIA INFUSION
10.0000 mg | Freq: Once | INTRAVENOUS | Status: AC
  Administered 2014-01-06: 10 mg via INTRAVENOUS
  Filled 2014-01-06: qty 10

## 2014-01-06 MED ORDER — LEVOFLOXACIN IN D5W 750 MG/150ML IV SOLN
750.0000 mg | INTRAVENOUS | Status: DC
  Administered 2014-01-06 – 2014-01-10 (×3): 750 mg via INTRAVENOUS
  Filled 2014-01-06 (×4): qty 150

## 2014-01-06 MED ORDER — TECHNETIUM TC 99M DIETHYLENETRIAME-PENTAACETIC ACID: 41.0000 | Freq: Once | INTRAVENOUS | Status: AC | PRN

## 2014-01-06 MED ORDER — AMLODIPINE BESYLATE 5 MG PO TABS
5.0000 mg | ORAL_TABLET | Freq: Every day | ORAL | Status: DC
  Administered 2014-01-06: 5 mg via ORAL
  Filled 2014-01-06 (×2): qty 1

## 2014-01-06 MED ORDER — ONDANSETRON HCL 4 MG PO TABS: 4.0000 mg | ORAL_TABLET | Freq: Four times a day (QID) | ORAL | Status: DC | PRN

## 2014-01-06 MED ORDER — METOPROLOL TARTRATE 25 MG PO TABS
25.0000 mg | ORAL_TABLET | Freq: Two times a day (BID) | ORAL | Status: DC
  Administered 2014-01-06 – 2014-01-10 (×9): 25 mg via ORAL
  Filled 2014-01-06 (×10): qty 1

## 2014-01-06 MED ORDER — HEPARIN SODIUM (PORCINE) 5000 UNIT/ML IJ SOLN
5000.0000 [IU] | Freq: Three times a day (TID) | INTRAMUSCULAR | Status: DC
  Administered 2014-01-06 – 2014-01-09 (×9): 5000 [IU] via SUBCUTANEOUS
  Filled 2014-01-06 (×13): qty 1

## 2014-01-06 MED ORDER — SODIUM CHLORIDE 0.9 % IJ SOLN
3.0000 mL | Freq: Two times a day (BID) | INTRAMUSCULAR | Status: DC
  Administered 2014-01-08 – 2014-01-10 (×3): 3 mL via INTRAVENOUS

## 2014-01-06 MED ORDER — ATORVASTATIN CALCIUM 10 MG PO TABS
10.0000 mg | ORAL_TABLET | Freq: Every day | ORAL | Status: DC
  Administered 2014-01-06 – 2014-01-07 (×2): 10 mg via ORAL
  Filled 2014-01-06 (×3): qty 1

## 2014-01-06 MED ORDER — SIMVASTATIN 20 MG PO TABS
20.0000 mg | ORAL_TABLET | Freq: Every day | ORAL | Status: DC
  Filled 2014-01-06: qty 1

## 2014-01-06 MED ORDER — ONDANSETRON HCL 4 MG/2ML IJ SOLN: 4.0000 mg | Freq: Four times a day (QID) | INTRAMUSCULAR | Status: DC | PRN

## 2014-01-06 MED ORDER — SODIUM CHLORIDE 0.9 % IV SOLN
INTRAVENOUS | Status: DC
  Administered 2014-01-06: 15:00:00 via INTRAVENOUS

## 2014-01-06 MED ORDER — ALLOPURINOL 300 MG PO TABS
300.0000 mg | ORAL_TABLET | Freq: Every day | ORAL | Status: DC
  Administered 2014-01-06 – 2014-01-12 (×7): 300 mg via ORAL
  Filled 2014-01-06 (×7): qty 1

## 2014-01-06 MED ORDER — DILTIAZEM HCL 100 MG IV SOLR
5.0000 mg/h | INTRAVENOUS | Status: DC
  Administered 2014-01-06: 5 mg/h via INTRAVENOUS
  Filled 2014-01-06: qty 100

## 2014-01-06 MED ORDER — ACETAMINOPHEN 650 MG RE SUPP: 650.0000 mg | Freq: Four times a day (QID) | RECTAL | Status: DC | PRN

## 2014-01-06 MED ORDER — CLORAZEPATE DIPOTASSIUM 7.5 MG PO TABS: 7.5000 mg | ORAL_TABLET | Freq: Two times a day (BID) | ORAL | Status: DC | PRN

## 2014-01-06 MED ORDER — CETYLPYRIDINIUM CHLORIDE 0.05 % MT LIQD
7.0000 mL | Freq: Two times a day (BID) | OROMUCOSAL | Status: DC
  Administered 2014-01-06 – 2014-01-12 (×13): 7 mL via OROMUCOSAL

## 2014-01-06 MED ORDER — ENSURE COMPLETE PO LIQD
237.0000 mL | ORAL | Status: DC
Start: 2014-01-06 — End: 2014-01-12
  Administered 2014-01-06 – 2014-01-09 (×3): 237 mL via ORAL

## 2014-01-06 NOTE — H&P (Signed)
[facility] History and Physical  Patient: Erik Ramirez  PYP:[id number]  DOB: 13-Jan-1926  DOS: the patient was seen and examined on 01/05/2014 PCP: No primary provider on file.  Chief Complaint: Shortness of breath  HPI: Erik Ramirez is a 80 y.o. male with Past medical history of hypertension, dyslipidemia, bladder outlet obstruction with prostate surgery, diabetes mellitus, sleep apnea on CPAP. Patient presents with complaints of shortness of breath progressively worsening over last 2 days associated with cough and fever. Patient mentions that since last 5-6 weeks he has been having diffuse abdominal pain with distention. Along with that he had weight loss, night sweats, low-grade fever. With this he went to see his PCP was alert and CT scan of his abdomen at which time he was found to have multiple lymph nodes and which were suspicious for lymphoma. He underwent a biopsy on August 31 which resulted in diffuse large B-cell lymphoma and patient was scheduled to see oncology at [facility]. Since last 3-4 days the patient has been having progressively worsening shortness of breath. Patient denies any chest pain chest heaviness chest tightness dizziness lightheadedness. Patient has chronic leg swelling which is unchanged and patient denies any leg tenderness. Patient also denies any recent change in his medication. No orthopnea or PND he is also reported.  The patient is coming from home. And at his baseline independent for most of his ADL.  Review of Systems: as mentioned in the history of present illness.  A Comprehensive review of the other systems is negative.  Past Medical History  Diagnosis Date  . Hypertension   . Hyperlipemia   . Anxiety   . Lymphadenopathy, abdominal   . Urethral stricture   . Anemia   . BPPV (benign paroxysmal positional vertigo)   . Lymphoma   . Diabetes mellitus without complication   . Obstructive sleep apnea    Past Surgical History   Procedure Laterality Date  . Back surgery    . Prostate surgery    . Tonsillectomy     Social History:  reports that he has never smoked. He does not have any smokeless tobacco history on file. He reports that he does not drink alcohol or use illicit drugs.  Allergies  Allergen Reactions  . Caffeine Other (See Comments)    Makes patient feel like he's going to die  . Morphine And Related Other (See Comments)    excitability  . Penicillins Rash    History reviewed. No pertinent family history.  Prior to Admission medications   Medication Sig Start Date End Date Taking? Authorizing Provider  amLODipine (NORVASC) 10 MG tablet Take 5 mg by mouth daily.     Yes Historical Provider, MD  aspirin 81 MG tablet Take 81 mg by mouth daily.     Yes Historical Provider, MD  clorazepate (TRANXENE) 7.5 MG tablet Take 7.5 mg by mouth 2 (two) times daily as needed for anxiety or sleep.   Yes Historical Provider, MD  clotrimazole-betamethasone (LOTRISONE) cream Apply 1 application topically 2 (two) times daily.   Yes Historical Provider, MD  lisinopril (PRINIVIL,ZESTRIL) 20 MG tablet Take 20 mg by mouth daily.     Yes Historical Provider, MD  metoprolol (LOPRESSOR) 50 MG tablet Take 25 mg by mouth 2 (two) times daily.     Yes Historical Provider, MD  simvastatin (ZOCOR) 20 MG tablet Take 20 mg by mouth daily.   Yes Historical Provider, MD  traMADol (ULTRAM) 50 MG tablet Take 50 mg by mouth every  6 (six) hours as needed for moderate pain.   Yes Historical Provider, MD    Physical Exam: Filed Vitals:   01/05/14 2004  BP: 106/53  Pulse: 85  Temp: 98.2 F (36.8 C)  TempSrc: Oral  Resp: 19  Height: 5\' 7"  (1.702 m)  Weight: 85.276 kg (188 lb)  SpO2: 90%    General: Alert, Awake and Oriented to Time, Place and Person. Appear in mild distress Eyes: PERRL ENT: Oral Mucosa clear moist. Neck: no  JVD Cardiovascular: S1 and S2 Present, aortic systolic  Murmur, Peripheral Pulses  Present Respiratory: Bilateral Air entry equal and Decreased, Clear to Auscultation,  no Crackles, no  wheezes Abdomen: Bowel Sound Present , Soft and distended, Non tender Skin:  no  Rash Extremities:  bilateral  Pedal edema,  no  calf tenderness Neurologic: Grossly no focal neuro deficit.  Labs on Admission:  CBC:  Recent Labs Lab 01/05/14 2142  WBC 20.9*  NEUTROABS 11.5*  HGB 11.8*  HCT 35.3*  MCV 87.8  PLT 156    CMP     Component Value Date/Time   NA 137 01/05/2014 2142   K 5.5* 01/05/2014 2142   CL 101 01/05/2014 2142   CO2 23 01/05/2014 2142   GLUCOSE 135* 01/05/2014 2142   BUN 38* 01/05/2014 2142   CREATININE 2.23* 01/05/2014 2142   CALCIUM 8.4 01/05/2014 2142   PROT 6.0 01/05/2014 2142   ALBUMIN 2.3* 01/05/2014 2142   AST 75* 01/05/2014 2142   ALT 20 01/05/2014 2142   ALKPHOS 100 01/05/2014 2142   BILITOT 0.4 01/05/2014 2142   GFRNONAA 25* 01/05/2014 2142   GFRAA 29* 01/05/2014 2142    No results found for this basename: LIPASE, AMYLASE,  in the last 168 hours No results found for this basename: AMMONIA,  in the last 168 hours  No results found for this basename: CKTOTAL, CKMB, CKMBINDEX, TROPONINI,  in the last 168 hours BNP (last 3 results)  Recent Labs  01/05/14 2142  PROBNP 343.7    Radiological Exams on Admission: Dg Chest 2 View  01/05/2014   CLINICAL DATA:  Shortness of breath.  EXAM: CHEST  2 VIEW  COMPARISON:  None available.  FINDINGS: The cardiac silhouette, mediastinal and hilar contours are somewhat prominent. The upper mediastinum is slightly widened and the right pulmonary hila is prominent. There are small bilateral pleural effusions and overlying atelectasis. No edema or pneumothorax. The bony thorax is grossly intact.  IMPRESSION: Prominent superior mediastinal contours and prominent right hilum. Chest CT may be helpful for further evaluation.  Bilateral pleural effusions and bibasilar atelectasis.   Electronically Signed   By: Kalman Jewels M.D.    On: 01/05/2014 22:32   Ct Chest Wo Contrast  01/05/2014   CLINICAL DATA:  Chest pain.  Recent diagnosis of lymphoma.  EXAM: CT CHEST WITHOUT CONTRAST  TECHNIQUE: Multidetector CT imaging of the chest was performed following the standard protocol without IV contrast.  COMPARISON:  None.  FINDINGS: THORACIC INLET/BODY WALL:  Adenopathy at the thoracic inlet, with a node posterior to the left IJ measuring 15 mm short axis. Bilateral axillary lymphadenopathy, with a solid node on the left measuring 22 mm in short axis on image 18.  MEDIASTINUM:  No cardiomegaly or pericardial effusion. Diffuse mediastinal lymphadenopathy, present in all stations. Index node in the sub- carina region measures 28 mm short axis, larger than on abdominal imaging. Index left anterior juxtadiaphragmatic node is also larger at 14 mm (previously 10 mm). Bilateral hilar  lymphadenopathy. Negative esophagus. Diffuse atherosclerosis, including the coronary arteries. No evidence of acute vascular finding.  LUNG WINDOWS:  Small, layering water density pleural effusions bilaterally. There is atelectasis in both lower lobes.  7 mm nodule in the right middle lobe. This will be re-assesed at surveillance imaging.  Spiculated density at the right apex favors pleural/parenchymal scarring.  No evidence of edema, pneumothorax, or pneumonia.  UPPER ABDOMEN:  Diffuse adenopathy in the upper abdominal ligaments. No definitive progression since recent abdominal CT. The spleen is partially imaged but clearly enlarged.  OSSEOUS:  No acute fracture.  No suspicious lytic or blastic lesions.  IMPRESSION: 1. Diffuse lymphoma, with intrathoracic progression since 12/13/2013. 2. Small bilateral pleural effusions with lower lobe atelectasis.   Electronically Signed   By: [name] M.D.   On: 01/05/2014 23:03    EKG: Independently reviewed. normal sinus rhythm, nonspecific ST and T waves changes. Assessment/Plan Principal Problem:   Acute respiratory  failure with hypoxia Active Problems:   Hypertension   Urethral stricture   Diabetes mellitus without complication   Obstructive sleep apnea   DLBCL (diffuse large B cell lymphoma)   1. Acute respiratory failure with hypoxia  the patient is presenting with complaints of shortness of breath on exertion. He is found to have bilateral pleural effusion with worsening lymphadenopathy on a CT scan. He is recently diagnosed with diffuse large recent fall not on any treatment at present. With this probable etiology of patient's current presentation is worsening lymphoma along with pleural effusions. Due to the presence of fever cough and shortness of breath I would treat him with IV levofloxacin and follow sputum cultures as well as urine antigens. Possibility of PE: Not be ruled out although patient denies any chest pain chest heaviness, is not tachycardic, no new leg swelling reported. We get VQ scan as well as lower extremity Doppler in the morning as well as an echocardiogram in the morning. Oncology will be consulted for patient's diffuse large B-cell lymphoma for further management. Patient will be kept n.p.o. for any procedure in the morning.  2. hypertension. Continue with his home medications. Stable at present.  3. acute kidney injury. In May patient's serum creatinine was 1 today patient's serum creatinine is 2+. Patient's son mentions that patient did have elevated serum creatinine in the past. Also patient had renal ultrasound done in the past. At present I would continue to monitor patient's serum creatinine hold lisinopril and if there is no further improvement in patient's intake and then obtain renal ultrasound for further workup.  4. dyslipidemia Continue simvastatin.  5.Anxiety. Continue home medication.  Consults:  Oncology in morning   DVT Prophylaxis: subcutaneous Heparin Nutrition:  n.p.o.   Code Status:  full  Family Communication:  family  was present at  bedside, opportunity was given to ask question and all questions were answered satisfactorily at the time of interview. Disposition: Admitted to inpatient in telemetry unit.  Author: Berle Mull, MD [facility] Pager: [phone]   If 7PM-7AM, please contact night-coverage [email or web address] Password [email or web address]

## 2014-01-06 NOTE — Progress Notes (Signed)
  Echocardiogram 2D Echocardiogram has been performed.  Erik Ramirez M 01/06/2014, 3:04 PM

## 2014-01-06 NOTE — Consult Note (Signed)
[facility]  Telephone:([phone]) [phone] [phone]     ID: Erik Ramirez DOB: 10/17/1925  Erik#: [id number]  QTM#:[id number]  Ramirez Care Team: Valaria Good. [name], MD as PCP - General (Internal Medicine) PCP: [name], MD GYN: SU:  OTHER MD:  CHIEF COMPLAINT: "large B-cell lymphoma"  CURRENT TREATMENT: awaiting initial therapy   HPI: Erik Ramirez developed abdominal pain and distension sometime mid August 2015. Erik Ramirez thought this was diverticular disease. Eventually Erik Ramirez brought these concerns to Dr [name] and per Ramirez's report and chart review a CT of Erik abd/pelvis was obtained which showed significant adenopathy. Biopsy 12/13/2013 at [facility] reportedly showed a large cell, B-cell lymphoma. Erik Ramirez has developed "B" symptoms including unexplained fatigue, fevers and drenching sweats. Erik Ramirez presented to [facility] 01/05/2014 with worsening shortness of breath. Workup here included a chest CT 01/05/2014 which showed diffuse upper abdominal and mediastinal adenopathy, with bilateral pleural effusions. V/Q scan was low probability for PE and bilateral LE dopples were negative for DVT. Echo 01/06/2014 showed an EF or 55-60%. Erik Ramirez developed A-fib 01/06/2014 and has been started on cardizem. Other problems include acute renal injury, DM, HTN, dyslipidemia with atherosclerosis noted on CT scans and OSA requiring norcturnal C-pap.  Erik Ramirez was evaluated 01/06/2014 with multiple family memebrs in Erik room  REVIEW OF SYSTEMS: Short of breath with any activity but no cough, phlegm production or pleurisy; no CP/P; no unusual headaches, visual changes, N/V. No change in bowel/ bladder habits. Abd pain controlled on current meds  PAST MEDICAL HISTORY:;  Past Medical History  Diagnosis Date  . Hypertension   . Hyperlipemia   . Anxiety   . Lymphadenopathy, abdominal   . Urethral stricture   . Anemia   . BPPV (benign paroxysmal positional vertigo)   . Lymphoma   .  Diabetes mellitus without complication   . Obstructive sleep apnea     PAST SURGICAL HISTORY: Past Surgical History  Procedure Laterality Date  . Back surgery    . Prostate surgery    . Tonsillectomy      FAMILY HISTORY History reviewed. No pertinent family history. Ramirez's father had a "muscle disease" and died from unclear causes age 45. Ramirez's mother died from heartproblems age 68. Erik Ramirez had 1 brother, 5 sisters. 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 Erik Ramirez and his wife Erik Ramirez have ben married 58 years. Son Erik Ramirez lives in [place] and works in Press photographer. Son Erik Ramirez lives in [place] and is retired (disabled). Son Erik Ramirez runs a maintenance shop in [place], where Erik Ramirez lives. Son Erik Ramirez works in telephones in [place]. Erik Ramirez's wife, Erik Ramirez, is an Therapist, sports and worked for [employer] and [employer] many years. She now works for [employer].Erik Ramirez has 8 gch and 6 ggch. 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 Erik Ramirez tells me "if they can't do anything for me" Erik Ramirez would not want resuscitation or life support. His wife Erik Ramirez is his health care POA, with son Erik Ramirez listed second.   HEALTH MAINTENANCE: History  Substance Use Topics  . Smoking status: Never Smoker   . Smokeless tobacco: Not on file  . Alcohol Use: No   Allergies  Allergen Reactions  . Caffeine Other (See Comments)    Makes Ramirez feel like Erik Ramirez's going to die  . Morphine And Related Other (See Comments)    excitability  . Penicillins Rash  Current Facility-Administered Medications  Medication Dose Route Frequency Provider Last Rate Last Dose  . 0.9 %  sodium chloride infusion   Intravenous Continuous [name], MD 50 mL/hr at 01/06/14 1505    . acetaminophen (TYLENOL) tablet 650 mg  650 mg Oral Q6H PRN Berle Mull, MD       Or  . acetaminophen (TYLENOL) suppository 650 mg  650 mg Rectal Q6H PRN Berle Mull, MD       . amLODipine (NORVASC) tablet 5 mg  5 mg Oral Daily Berle Mull, MD   5 mg at 01/06/14 1207  . antiseptic oral rinse (CPC / CETYLPYRIDINIUM CHLORIDE 0.05%) solution 7 mL  7 mL Mouth Rinse q12n4p Berle Mull, MD   7 mL at 01/06/14 1600  . aspirin chewable tablet 81 mg  81 mg Oral Daily Berle Mull, MD   81 mg at 01/06/14 1207  . atorvastatin (LIPITOR) tablet 10 mg  10 mg Oral q1800 [name], MD   10 mg at 01/06/14 1758  . chlorhexidine (PERIDEX) 0.12 % solution 15 mL  15 mL Mouth Rinse BID Berle Mull, MD   15 mL at 01/06/14 1206  . clorazepate (TRANXENE) tablet 7.5 mg  7.5 mg Oral BID PRN Berle Mull, MD      . diltiazem (CARDIZEM) 100 mg in dextrose 5 % 100 mL (1 mg/mL) infusion  5-15 mg/hr Intravenous Continuous [name], MD 5 mL/hr at 01/06/14 1743 5 mg/hr at 01/06/14 1743  . feeding supplement (ENSURE COMPLETE) (ENSURE COMPLETE) liquid 237 mL  237 mL Oral Q24H Hazle Coca, RD   237 mL at 01/06/14 1208  . heparin injection 5,000 Units  5,000 Units Subcutaneous 3 times per day Berle Mull, MD   5,000 Units at 01/06/14 1409  . levofloxacin (LEVAQUIN) IVPB 750 mg  750 mg Intravenous Q48H Dorrene German, RPH   750 mg at 01/06/14 0157  . metoprolol tartrate (LOPRESSOR) tablet 25 mg  25 mg Oral BID Berle Mull, MD   25 mg at 01/06/14 1207  . ondansetron (ZOFRAN) tablet 4 mg  4 mg Oral Q6H PRN Berle Mull, MD       Or  . ondansetron (ZOFRAN) injection 4 mg  4 mg Intravenous Q6H PRN Berle Mull, MD      . sodium chloride 0.9 % injection 3 mL  3 mL Intravenous Q12H Berle Mull, MD      . technetium TC 54M diethylenetriame-pentaacetic acid (DTPA) injection 41 milli Curie  41 milli Curie Intravenous Once PRN Medication Radiologist, MD      . traMADol (ULTRAM) tablet 50 mg  50 mg Oral Q6H PRN Berle Mull, MD   50 mg at 01/06/14 0057    OBJECTIVE: elderly white male examined in bed Filed Vitals:   01/06/14 1736  BP: 108/77  Pulse: 124  Temp: 97.4 F (36.3 C)    Resp: 20     Body mass index is 29.12 kg/(m^2).    ECOG FS:2 - Symptomatic, <50% confined to bed  Full exam pending  LAB RESULTS:  CMP     Component Value Date/Time   NA 139 01/06/2014 0645   K 4.6 01/06/2014 0645   CL 104 01/06/2014 0645   CO2 23 01/06/2014 0645   GLUCOSE 95 01/06/2014 0645   BUN 41* 01/06/2014 0645   CREATININE 2.14* 01/06/2014 0645   CALCIUM 8.1* 01/06/2014 0645   PROT 5.8* 01/06/2014 0645   ALBUMIN 2.1* 01/06/2014 0645   AST 66* 01/06/2014  0645   ALT 18 01/06/2014 0645   ALKPHOS 99 01/06/2014 0645   BILITOT 0.4 01/06/2014 0645   GFRNONAA 26* 01/06/2014 0645   GFRAA 31* 01/06/2014 0645    I No results found for this basename: SPEP, UPEP,  kappa and lambda light chains    Lab Results  Component Value Date   WBC 20.6* 01/06/2014   NEUTROABS 9.0* 01/06/2014   HGB 11.1* 01/06/2014   HCT 33.1* 01/06/2014   MCV 86.0 01/06/2014   PLT 160 01/06/2014    @LASTCHEMISTRY @  No results found for this basename: LABCA2    No components found with this basename: LABCA125     Recent Labs Lab 01/06/14 0645  INR 1.15    Urinalysis    Component Value Date/Time   COLORURINE AMBER* 02/15/2011 2216   APPEARANCEUR TURBID* 02/15/2011 2216   LABSPEC 1.021 02/15/2011 2216   PHURINE 5.5 02/15/2011 [place] 02/15/2011 2216   HGBUR LARGE* 02/15/2011 2216   BILIRUBINUR NEGATIVE 02/15/2011 2216   KETONESUR NEGATIVE 02/15/2011 2216   PROTEINUR 100* 02/15/2011 2216   UROBILINOGEN 0.2 02/15/2011 2216   NITRITE POSITIVE* 02/15/2011 2216   LEUKOCYTESUR LARGE* 02/15/2011 2216    STUDIES: Dg Chest 2 View  01/05/2014   CLINICAL DATA:  Shortness of breath.  EXAM: CHEST  2 VIEW  COMPARISON:  None available.  FINDINGS: Erik cardiac silhouette, mediastinal and hilar contours are somewhat prominent. Erik upper mediastinum is slightly widened and Erik right pulmonary hila is prominent. There are small bilateral pleural effusions and overlying atelectasis. No edema or pneumothorax. Erik  bony thorax is grossly intact.  IMPRESSION: Prominent superior mediastinal contours and prominent right hilum. Chest CT may be helpful for further evaluation.  Bilateral pleural effusions and bibasilar atelectasis.   Electronically Signed   By: Kalman Jewels M.D.   On: 01/05/2014 22:32   Ct Chest Wo Contrast  01/05/2014   CLINICAL DATA:  Chest pain.  Recent diagnosis of lymphoma.  EXAM: CT CHEST WITHOUT CONTRAST  TECHNIQUE: Multidetector CT imaging of Erik chest was performed following Erik standard protocol without IV contrast.  COMPARISON:  None.  FINDINGS: THORACIC INLET/BODY WALL:  Adenopathy at Erik thoracic inlet, with a node posterior to Erik left IJ measuring 15 mm short axis. Bilateral axillary lymphadenopathy, with a solid node on Erik left measuring 22 mm in short axis on image 18.  MEDIASTINUM:  No cardiomegaly or pericardial effusion. Diffuse mediastinal lymphadenopathy, present in all stations. Index node in Erik sub- carina region measures 28 mm short axis, larger than on abdominal imaging. Index left anterior juxtadiaphragmatic node is also larger at 14 mm (previously 10 mm). Bilateral hilar lymphadenopathy. Negative esophagus. Diffuse atherosclerosis, including Erik coronary arteries. No evidence of acute vascular finding.  LUNG WINDOWS:  Small, layering water density pleural effusions bilaterally. There is atelectasis in both lower lobes.  7 mm nodule in Erik right middle lobe. This will be re-assesed at surveillance imaging.  Spiculated density at Erik right apex favors pleural/parenchymal scarring.  No evidence of edema, pneumothorax, or pneumonia.  UPPER ABDOMEN:  Diffuse adenopathy in Erik upper abdominal ligaments. No definitive progression since recent abdominal CT. Erik spleen is partially imaged but clearly enlarged.  OSSEOUS:  No acute fracture.  No suspicious lytic or blastic lesions.  IMPRESSION: 1. Diffuse lymphoma, with intrathoracic progression since 12/13/2013. 2. Small bilateral pleural  effusions with lower lobe atelectasis.   Electronically Signed   By: [name] M.D.   On: 01/05/2014 23:03  Nm Pulmonary Perf And Vent  01/06/2014   CLINICAL DATA:  Shortness of breath question pulmonary embolism, history hypertension, diabetes, lymphoma  EXAM: NUCLEAR MEDICINE VENTILATION - PERFUSION LUNG SCAN  TECHNIQUE: Ventilation images were obtained in multiple projections using inhaled aerosol technetium 99 M DTPA. Perfusion images were obtained in multiple projections after intravenous injection of Tc-13m MAA.  RADIOPHARMACEUTICALS:  41.0 mCi Tc-4m DTPA aerosol inhalation and 5.0 mCi Tc-28m MAA IV  COMPARISON:  None; correlation chest radiograph and CT chest exams of 01/05/2014  FINDINGS: Ventilation: Mild central airway deposition of tracer. Minimal peripheral regularity of peripheral ventilation in both lungs.  Perfusion: Irregular areas of nonsegmental diminished perfusion at Erik lateral and posterolateral RIGHT lung and at Erik posterior LEFT lung, corresponding to pleural effusions identified on chest CT. Small subsegmental perfusion defect RIGHT lower lobe. No additional segmental or subsegmental perfusion defects identified.  Chest CT demonstrates small to moderate-sized pleural effusions bilaterally larger on RIGHT with compressive atelectasis of BILATERAL lower lobes also greater on RIGHT.  IMPRESSION: BILATERAL pleural effusions causing compression of Erik lungs bilaterally.  Single subsegmental perfusion defect RIGHT lower lobe.  Findings represent a low probability for pulmonary embolism.   Electronically Signed   By: Lavonia Dana M.D.   On: 01/06/2014 12:19    ASSESSMENT: 80 y.o. [name] s/p lymph node biopsy reportedly showing diffuse large B-cell lymphoma  PLAN: I spent approximately one hour with Erik Ramirez and family reviewing his situation. They understand I do not have a copy of his pathology report and so do not feel I can initiate therapy. We will try to get that  report in AM. We discussed Erik fact that diffuse large B-cell lymphomas, if that is indeed what we are dealing with, are potentially curable and require chemotherapy and immunotherapy. We discussed sme of Erik potential side effects, complications and toxicities of these agents.  I will obtain baseline labs in AM and start allopurinol. We may be able to start treatment tomorrow.  Erik Ramirez has a good understanding of Erik overall plan. Erik Ramirez agrees with it. Erik Ramirez knows Erik goal of treatment in her case is cure. Marland Kitchen    Chauncey Cruel, MD   01/06/2014 7:19 PM

## 2014-01-06 NOTE — Progress Notes (Addendum)
Progress Note   Erik Ramirez CHY:[id number] DOB: 1925/10/06 DOA: 01/05/2014 PCP: [name], MD   Brief Narrative:   Erik Ramirez is an 80 y.o. male with PMH of hypertension, dyslipidemia, diabetes, OSA on nocturnal CPAP and recently diagnosed large B cell lymphoma who was admitted on 01/05/14 with chief complaint of a two-day history of progressively worsening dyspnea associated with cough and fever. Upon initial evaluation in the ED, the patient was found to have bilateral pleural effusions with worsening lymphadenopathy on CT scan without contrast. Oncology consultation is pending. On 01/06/14, the patient went into atrial fibrillation with RVR and a Cardizem drip was initiated.  Assessment/Plan:   Principal Problem:   Acute respiratory failure with hypoxia  Broad differential with progressive lymphoma/lymphadenopathy, community-acquired pneumonia, pulmonary embolism: The differential.  Oncology consultation requested to evaluate lymphoma.  On empiric antibiotics to address possible community-acquired pneumonia. Strep pneumonia/Legionella and sputum cultures requested.  VQ scan and lower extremity Dopplers negative, so pulmonary embolism ruled out. 2-D echo ordered as well.  Continue supportive care with oxygen as needed to maintain saturations.  Active Problems:   Atrial fibrillation with rapid ventricular response  Patient is being monitored on telemetry, and on 01/06/14, he converted from in NSR to atrial fibrillation with RVR. Cardizem 10 mg bolus followed by continuous infusion ordered.  Cycle cardiac markers q. 6 hours x3.  Continue aspirin therapy for now, consider therapeutic anticoagulation.    Stage III chronic kidney disease  Current GFR 25-26.    Hypertension  Continue Norvasc and Lopressor. Lisinopril on hold secondary to impaired renal function.    Diabetes mellitus with renal complications  Diet controlled, borderline per patient.  Check  hemoglobin A1c.    Obstructive sleep apnea  Continue nocturnal CPAP.    DLBCL (diffuse large B cell lymphoma)  Oncology consultation requested.    Dyslipidemia  Continue simvastatin      DVT Prophylaxis  Continue heparin.  Code Status: Full. Family Communication: Wife and son at bedside. Disposition Plan: Home when stable.   IV Access:    Peripheral IV   Procedures and diagnostic studies:   Dg Chest 2 View 01/05/2014: Prominent superior mediastinal contours and prominent right hilum. Chest CT may be helpful for further evaluation.  Bilateral pleural effusions and bibasilar atelectasis.     Ct Chest Wo Contrast 01/05/2014: 1. Diffuse lymphoma, with intrathoracic progression since 12/13/2013. 2. Small bilateral pleural effusions with lower lobe atelectasis.   Electronically Signed   By: [name] M.D.   On: 01/05/2014 23:03   Nm Pulmonary Perf And Vent 01/06/2014: BILATERAL pleural effusions causing compression of the lungs bilaterally.  Single subsegmental perfusion defect RIGHT lower lobe.  Findings represent a low probability for pulmonary embolism.      Medical Consultants:    Dr. [name], Oncology.   Other Consultants:    None.   Anti-Infectives:    Levaquin 01/05/14--->  Subjective:    Erik Ramirez says he is a little less short of breath.  No current complaints of abdominal pain.  Last BM was 24-48 hours ago.  No nausea or vomiting.  No chest pain.   Objective:    Filed Vitals:   01/06/14 0030 01/06/14 0039 01/06/14 0119 01/06/14 0550  BP:  111/61  120/66  Pulse: 72 73 72 75  Temp:  98.3 F (36.8 C)  98.1 F (36.7 C)  TempSrc:  Oral  Oral  Resp:  20 20 21   Height:  5\' 7"  (1.702 m)  Weight:  84.369 kg (186 lb)    SpO2:  95% 95% 98%    Intake/Output Summary (Last 24 hours) at 01/06/14 1228 Last data filed at 01/06/14 1000  Gross per 24 hour  Intake    240 ml  Output      0 ml  Net    240 ml    Exam: Gen:  NAD,  weak Cardiovascular:  RRR, No M/R/G Respiratory:  Lungs CTAB, diminished in the bases Gastrointestinal:  Abdomen soft, NT/ND, + BS Extremities:  Trace edema   Data Reviewed:    Labs: Basic Metabolic Panel:  Recent Labs Lab 01/05/14 2142 01/06/14 0645  NA 137 139  K 5.5* 4.6  CL 101 104  CO2 23 23  GLUCOSE 135* 95  BUN 38* 41*  CREATININE 2.23* 2.14*  CALCIUM 8.4 8.1*   GFR Estimated Creatinine Clearance: 26.2 ml/min (by C-G formula based on Cr of 2.14). Liver Function Tests:  Recent Labs Lab 01/05/14 2142 01/06/14 0645  AST 75* 66*  ALT 20 18  ALKPHOS 100 99  BILITOT 0.4 0.4  PROT 6.0 5.8*  ALBUMIN 2.3* 2.1*   Coagulation profile  Recent Labs Lab 01/05/14 2142 01/06/14 0645  INR 1.17 1.15    CBC:  Recent Labs Lab 01/05/14 2142 01/06/14 0645  WBC 20.9* 20.6*  NEUTROABS 11.5* 9.0*  HGB 11.8* 11.1*  HCT 35.3* 33.1*  MCV 87.8 86.0  PLT 156 160   BNP (last 3 results)  Recent Labs  01/05/14 2142  PROBNP 343.7   Microbiology No results found for this or any previous visit (from the past 240 hour(s)).   Medications:   . amLODipine  5 mg Oral Daily  . antiseptic oral rinse  7 mL Mouth Rinse q12n4p  . aspirin  81 mg Oral Daily  . chlorhexidine  15 mL Mouth Rinse BID  . feeding supplement (ENSURE COMPLETE)  237 mL Oral Q24H  . heparin  5,000 Units Subcutaneous 3 times per day  . levofloxacin (LEVAQUIN) IV  750 mg Intravenous Q48H  . metoprolol  25 mg Oral BID  . simvastatin  20 mg Oral Daily  . sodium chloride  3 mL Intravenous Q12H   Continuous Infusions:   Time spent: 35 minutes with > 50% of time discussing current diagnostic test results, clinical impression and plan of care.    LOS: 1 day   [name]  [facility] Pager [phone]. If unable to reach me by pager, please call my cell phone at [phone].  *Please refer to [email or web address], password [email or web address] to get updated schedule on who will round on this patient, as hospitalists  switch teams weekly. If 7PM-7AM, please contact night-coverage at [email or web address], password [email or web address] for any overnight needs.  01/06/2014, 12:28 PM

## 2014-01-06 NOTE — Progress Notes (Signed)
Utilization review completed.  

## 2014-01-06 NOTE — Progress Notes (Signed)
Patient converted to A.fibrillation with RVR and was confirmed by EKG. Patient asymptomatic. VS obtained. BP 108/77, pulse 120s-140s, respiration 20, temp 97.4, and oxygen saturation 94% on 2 L/M [place]. MD was paged with these findings.  Cardizem gtt ordered and started with bolus and rate at 5mg /hr. Will continue to monitor patient.

## 2014-01-06 NOTE — Progress Notes (Signed)
ANTIBIOTIC CONSULT NOTE - INITIAL  Pharmacy Consult for Levaquin Indication: Acute respiratory failure with hypoxia  Allergies  Allergen Reactions  . Caffeine Other (See Comments)    Makes patient feel like he's going to die  . Morphine And Related Other (See Comments)    excitability  . Penicillins Rash    Patient Measurements: Height: 5\' 7"  (170.2 cm) Weight: 186 lb (84.369 kg) IBW/kg (Calculated) : 66.1   Vital Signs: Temp: 98.3 F (36.8 C) (09/24 0039) Temp src: Oral (09/24 0039) BP: 111/61 mmHg (09/24 0039) Pulse Rate: 72 (09/24 0119) Intake/Output from previous day:   Intake/Output from this shift:    Labs:  Recent Labs  01/05/14 2142  WBC 20.9*  HGB 11.8*  PLT 156  CREATININE 2.23*   Estimated Creatinine Clearance: 25.1 ml/min (by C-G formula based on Cr of 2.23). No results found for this basename: VANCOTROUGH, VANCOPEAK, VANCORANDOM, GENTTROUGH, GENTPEAK, GENTRANDOM, TOBRATROUGH, TOBRAPEAK, TOBRARND, AMIKACINPEAK, AMIKACINTROU, AMIKACIN,  in the last 72 hours   Microbiology: No results found for this or any previous visit (from the past 720 hour(s)).  Medical History: Past Medical History  Diagnosis Date  . Hypertension   . Hyperlipemia   . Anxiety   . Lymphadenopathy, abdominal   . Urethral stricture   . Anemia   . BPPV (benign paroxysmal positional vertigo)   . Lymphoma   . Diabetes mellitus without complication   . Obstructive sleep apnea     Medications:  Scheduled:  . amLODipine  5 mg Oral Daily  . antiseptic oral rinse  7 mL Mouth Rinse q12n4p  . aspirin  81 mg Oral Daily  . chlorhexidine  15 mL Mouth Rinse BID  . heparin  5,000 Units Subcutaneous 3 times per day  . levofloxacin (LEVAQUIN) IV  750 mg Intravenous Q48H  . metoprolol  25 mg Oral BID  . simvastatin  20 mg Oral Daily  . sodium chloride  3 mL Intravenous Q12H   Infusions:   Assessment: 14 yoM c/o SOB associated with cough and fever. Levaquin per Rx for acute  respiratory failure with hypoxia.   Goal of Therapy:  Treat infection  Plan:   Levaquin 750mg  IV q48h  F/u Scr/cultures as needed  Lawana Pai R 01/06/2014,4:58 AM

## 2014-01-06 NOTE — Progress Notes (Signed)
VASCULAR LAB PRELIMINARY  PRELIMINARY  PRELIMINARY  PRELIMINARY  Bilateral lower extremity venous duplex  completed.    Preliminary report:  Bilateral:  No evidence of DVT, superficial thrombosis, or Baker's Cyst.    Shaneeka Scarboro, RVT 01/06/2014, 9:30 AM

## 2014-01-06 NOTE — Progress Notes (Signed)
INITIAL NUTRITION ASSESSMENT  DOCUMENTATION CODES Per approved criteria  -Not Applicable   INTERVENTION: -Recommend one Ensure Complete daily (strawberry or vanilla) -Provided nutrition supplement coupons -Continue to recommend regular diet to encourage PO intake -RD to continue to monitor  NUTRITION DIAGNOSIS: Inadequate oral intake related to abd pain/decreased appetite as evidenced by decreased PO intake, wt loss.   Goal: Pt to meet >/= 90% of their estimated nutrition needs    Monitor:  Total protein/energy intake, labs, weights, GI profile  Reason for Assessment: MST  80 y.o. male  Admitting Dx: Acute respiratory failure with hypoxia  ASSESSMENT: Erik Ramirez is a 80 y.o. male with Past medical history of hypertension, dyslipidemia, bladder outlet obstruction with prostate surgery, diabetes mellitus, sleep apnea on CPAP. Patient presents with complaints of shortness of breath progressively worsening over last 2 days associated with cough and fever  -Pt reported decreased appetite for past 5-6 weeks d/t feelings of early satiety and abd distension.  -Diet recall indicates pt would consume 2-3 meals daily; however had been decreasing portion sizes and amount of foods he would eat at each meal. Wife had been encouraging Boost if pt did not have appetite for full meals -Endorsed weight loss. Usual body weight around 195 lbs. Was unable to determine when weight loss began, he noted it was likely over 1-2 months. -Pt eating well during admit, consumed 100% of breakfast -MD noted pt possible for lymphoma. Will order vanilla or strawberry Ensure once daily as pt is allergic to caffeine -Provided pt's wife with supplement coupon to assist with compliance to supplement regimen upon d/c   Height: Ht Readings from Last 1 Encounters:  01/06/14 5\' 7"  (1.702 m)    Weight: Wt Readings from Last 1 Encounters:  01/06/14 186 lb (84.369 kg)    Ideal Body Weight: 148 lbs  % Ideal  Body Weight: 125%  Wt Readings from Last 10 Encounters:  01/06/14 186 lb (84.369 kg)  02/15/11 180 lb (81.647 kg)    Usual Body Weight: 195 lb  % Usual Body Weight: 95%  BMI:  Body mass index is 29.12 kg/(m^2). overweight  Estimated Nutritional Needs: Kcal: 1700-1900 Protein: 85-100 gram Fluid: >/=1700 ml/daily  Skin: WDL  Diet Order: General  EDUCATION NEEDS: -Education needs addressed   Intake/Output Summary (Last 24 hours) at 01/06/14 1132 Last data filed at 01/06/14 1000  Gross per 24 hour  Intake    240 ml  Output      0 ml  Net    240 ml    Last BM: 9/23   Labs:   Recent Labs Lab 01/05/14 2142 01/06/14 0645  NA 137 139  K 5.5* 4.6  CL 101 104  CO2 23 23  BUN 38* 41*  CREATININE 2.23* 2.14*  CALCIUM 8.4 8.1*  GLUCOSE 135* 95    CBG (last 3)  No results found for this basename: GLUCAP,  in the last 72 hours  Scheduled Meds: . amLODipine  5 mg Oral Daily  . antiseptic oral rinse  7 mL Mouth Rinse q12n4p  . aspirin  81 mg Oral Daily  . chlorhexidine  15 mL Mouth Rinse BID  . feeding supplement (ENSURE COMPLETE)  237 mL Oral Q24H  . heparin  5,000 Units Subcutaneous 3 times per day  . levofloxacin (LEVAQUIN) IV  750 mg Intravenous Q48H  . metoprolol  25 mg Oral BID  . simvastatin  20 mg Oral Daily  . sodium chloride  3 mL Intravenous Q12H  Continuous Infusions:   Past Medical History  Diagnosis Date  . Hypertension   . Hyperlipemia   . Anxiety   . Lymphadenopathy, abdominal   . Urethral stricture   . Anemia   . BPPV (benign paroxysmal positional vertigo)   . Lymphoma   . Diabetes mellitus without complication   . Obstructive sleep apnea     Past Surgical History  Procedure Laterality Date  . Back surgery    . Prostate surgery    . Tonsillectomy      Chester [place] [employer] Clinical Dietitian [phone]

## 2014-01-06 NOTE — Progress Notes (Signed)
Spoke with Pt in regards to CPAP.  Pt stated that he would have RN notify RT after night time meds are administered.  RN notified to call RT.  RT to monitor and assess as needed.

## 2014-01-06 NOTE — Progress Notes (Addendum)
Patient has not voided since last night and urine sample needed to be obtained. After attempting to stand to urinate with no results, I bladder scanned patient which yielded a max of 159cc.  Patient has been NPO since last night. Diet has been advanced.  Will continue to monitor patient.

## 2014-01-07 ENCOUNTER — Other Ambulatory Visit: Payer: Self-pay | Admitting: Oncology

## 2014-01-07 DIAGNOSIS — I4891 Unspecified atrial fibrillation: Secondary | ICD-10-CM

## 2014-01-07 DIAGNOSIS — C8299 Follicular lymphoma, unspecified, extranodal and solid organ sites: Secondary | ICD-10-CM

## 2014-01-07 LAB — CBC WITH DIFFERENTIAL/PLATELET
BASOS ABS: 0.2 10*3/uL — AB (ref 0.0–0.1)
Basophils Relative: 1 % (ref 0–1)
Eosinophils Absolute: 0.4 10*3/uL (ref 0.0–0.7)
Eosinophils Relative: 2 % (ref 0–5)
HEMATOCRIT: 35.9 % — AB (ref 39.0–52.0)
Hemoglobin: 11.6 g/dL — ABNORMAL LOW (ref 13.0–17.0)
LYMPHS ABS: 7.5 10*3/uL — AB (ref 0.7–4.0)
LYMPHS PCT: 43 % (ref 12–46)
MCH: 28.5 pg (ref 26.0–34.0)
MCHC: 32.3 g/dL (ref 30.0–36.0)
MCV: 88.2 fL (ref 78.0–100.0)
MONO ABS: 1.6 10*3/uL — AB (ref 0.1–1.0)
Monocytes Relative: 9 % (ref 3–12)
NEUTROS ABS: 7.8 10*3/uL — AB (ref 1.7–7.7)
Neutrophils Relative %: 45 % (ref 43–77)
Platelets: 148 10*3/uL — ABNORMAL LOW (ref 150–400)
RBC: 4.07 MIL/uL — ABNORMAL LOW (ref 4.22–5.81)
RDW: 15.1 % (ref 11.5–15.5)
WBC: 17.5 10*3/uL — ABNORMAL HIGH (ref 4.0–10.5)

## 2014-01-07 LAB — COMPREHENSIVE METABOLIC PANEL
ALT: 18 U/L (ref 0–53)
ANION GAP: 12 (ref 5–15)
AST: 55 U/L — ABNORMAL HIGH (ref 0–37)
Albumin: 2 g/dL — ABNORMAL LOW (ref 3.5–5.2)
Alkaline Phosphatase: 112 U/L (ref 39–117)
BUN: 39 mg/dL — AB (ref 6–23)
CO2: 24 mEq/L (ref 19–32)
CREATININE: 1.8 mg/dL — AB (ref 0.50–1.35)
Calcium: 8.2 mg/dL — ABNORMAL LOW (ref 8.4–10.5)
Chloride: 102 mEq/L (ref 96–112)
GFR calc Af Amer: 38 mL/min — ABNORMAL LOW (ref >90)
GFR calc non Af Amer: 33 mL/min — ABNORMAL LOW (ref >90)
Glucose, Bld: 108 mg/dL — ABNORMAL HIGH (ref 70–99)
POTASSIUM: 4.7 meq/L (ref 3.7–5.3)
Sodium: 138 mEq/L (ref 137–147)
TOTAL PROTEIN: 5.6 g/dL — AB (ref 6.0–8.3)
Total Bilirubin: 0.4 mg/dL (ref 0.3–1.2)

## 2014-01-07 LAB — HEPATITIS B SURFACE ANTIGEN: HEP B S AG: NEGATIVE

## 2014-01-07 LAB — TROPONIN I: Troponin I: <0.3 ng/mL (ref <0.30)

## 2014-01-07 LAB — LEGIONELLA ANTIGEN, URINE: Legionella Antigen, Urine: NEGATIVE

## 2014-01-07 LAB — LACTATE DEHYDROGENASE: LDH: 354 U/L — ABNORMAL HIGH (ref 94–250)

## 2014-01-07 LAB — GLUCOSE, CAPILLARY
GLUCOSE-CAPILLARY: 138 mg/dL — AB (ref 70–99)
GLUCOSE-CAPILLARY: 198 mg/dL — AB (ref 70–99)
Glucose-Capillary: 130 mg/dL — ABNORMAL HIGH (ref 70–99)

## 2014-01-07 LAB — HEPATITIS B CORE ANTIBODY, IGM: Hep B C IgM: NONREACTIVE

## 2014-01-07 MED ORDER — ASPIRIN 81 MG PO CHEW
325.0000 mg | CHEWABLE_TABLET | Freq: Every day | ORAL | Status: DC
  Administered 2014-01-09 – 2014-01-10 (×2): 325 mg via ORAL
  Administered 2014-01-11: 324 mg via ORAL
  Filled 2014-01-07 (×6): qty 4

## 2014-01-07 MED ORDER — DILTIAZEM HCL ER COATED BEADS 120 MG PO CP24
120.0000 mg | ORAL_CAPSULE | Freq: Every day | ORAL | Status: DC
  Administered 2014-01-07 – 2014-01-12 (×6): 120 mg via ORAL
  Filled 2014-01-07 (×6): qty 1

## 2014-01-07 MED ORDER — SODIUM CHLORIDE 0.9 % IJ SOLN: 3.0000 mL | INTRAMUSCULAR | Status: DC | PRN

## 2014-01-07 MED ORDER — SODIUM CHLORIDE 0.9 % IV SOLN
Freq: Once | INTRAVENOUS | Status: AC
  Administered 2014-01-07: 15:00:00 via INTRAVENOUS

## 2014-01-07 MED ORDER — ALTEPLASE 2 MG IJ SOLR
2.0000 mg | Freq: Once | INTRAMUSCULAR | Status: AC | PRN
  Filled 2014-01-07: qty 2

## 2014-01-07 MED ORDER — DIPHENHYDRAMINE HCL 50 MG/ML IJ SOLN
25.0000 mg | Freq: Once | INTRAMUSCULAR | Status: AC | PRN
  Administered 2014-01-07: 25 mg via INTRAVENOUS

## 2014-01-07 MED ORDER — ACETAMINOPHEN 325 MG PO TABS
650.0000 mg | ORAL_TABLET | Freq: Once | ORAL | Status: AC
  Administered 2014-01-07: 650 mg via ORAL
  Filled 2014-01-07: qty 2

## 2014-01-07 MED ORDER — DIPHENHYDRAMINE HCL 50 MG/ML IJ SOLN
50.0000 mg | Freq: Once | INTRAMUSCULAR | Status: AC | PRN
  Administered 2014-01-07: 25 mg via INTRAVENOUS

## 2014-01-07 MED ORDER — SODIUM CHLORIDE 0.9 % IV SOLN
Freq: Once | INTRAVENOUS | Status: AC | PRN
Start: 2014-01-07 — End: 2014-01-07

## 2014-01-07 MED ORDER — METHYLPREDNISOLONE SODIUM SUCC 125 MG IJ SOLR
125.0000 mg | Freq: Once | INTRAMUSCULAR | Status: AC | PRN
  Administered 2014-01-07: 125 mg via INTRAVENOUS
  Filled 2014-01-07: qty 2

## 2014-01-07 MED ORDER — EPINEPHRINE HCL 1 MG/ML IJ SOLN
0.5000 mg | Freq: Once | INTRAMUSCULAR | Status: AC | PRN
  Filled 2014-01-07: qty 1

## 2014-01-07 MED ORDER — HEPARIN SOD (PORK) LOCK FLUSH 100 UNIT/ML IV SOLN
250.0000 [IU] | Freq: Once | INTRAVENOUS | Status: AC | PRN
  Filled 2014-01-07: qty 3

## 2014-01-07 MED ORDER — SODIUM CHLORIDE 0.9 % IJ SOLN: 10.0000 mL | INTRAMUSCULAR | Status: DC | PRN

## 2014-01-07 MED ORDER — HEPARIN SOD (PORK) LOCK FLUSH 100 UNIT/ML IV SOLN
500.0000 [IU] | Freq: Once | INTRAVENOUS | Status: AC | PRN
  Filled 2014-01-07: qty 5

## 2014-01-07 MED ORDER — EPINEPHRINE HCL 0.1 MG/ML IJ SOSY
0.2500 mg | PREFILLED_SYRINGE | Freq: Once | INTRAMUSCULAR | Status: AC | PRN
  Administered 2014-01-07: 0.25 mg via INTRAVENOUS
  Filled 2014-01-07: qty 10

## 2014-01-07 MED ORDER — DIPHENHYDRAMINE HCL 25 MG PO CAPS
25.0000 mg | ORAL_CAPSULE | Freq: Once | ORAL | Status: AC
  Administered 2014-01-07: 25 mg via ORAL
  Filled 2014-01-07: qty 1

## 2014-01-07 MED ORDER — EPINEPHRINE HCL 0.1 MG/ML IJ SOSY
0.2500 mg | PREFILLED_SYRINGE | Freq: Once | INTRAMUSCULAR | Status: AC | PRN
Start: 2014-01-07 — End: 2014-01-07
  Administered 2014-01-07: 0.25 mg via INTRAVENOUS
  Filled 2014-01-07: qty 10

## 2014-01-07 MED ORDER — INSULIN ASPART 100 UNIT/ML ~~LOC~~ SOLN
0.0000 [IU] | Freq: Three times a day (TID) | SUBCUTANEOUS | Status: DC
  Administered 2014-01-07 (×2): 3 [IU] via SUBCUTANEOUS
  Administered 2014-01-08 (×3): 7 [IU] via SUBCUTANEOUS
  Administered 2014-01-09: 3 [IU] via SUBCUTANEOUS
  Administered 2014-01-09: 4 [IU] via SUBCUTANEOUS
  Administered 2014-01-09: 7 [IU] via SUBCUTANEOUS
  Administered 2014-01-10: 3 [IU] via SUBCUTANEOUS
  Administered 2014-01-10: 4 [IU] via SUBCUTANEOUS
  Administered 2014-01-10: 7 [IU] via SUBCUTANEOUS
  Administered 2014-01-11: 3 [IU] via SUBCUTANEOUS
  Administered 2014-01-11: 4 [IU] via SUBCUTANEOUS
  Administered 2014-01-11: 7 [IU] via SUBCUTANEOUS
  Administered 2014-01-12: 11 [IU] via SUBCUTANEOUS
  Administered 2014-01-12: 4 [IU] via SUBCUTANEOUS

## 2014-01-07 MED ORDER — ALBUTEROL SULFATE (2.5 MG/3ML) 0.083% IN NEBU
2.5000 mg | INHALATION_SOLUTION | Freq: Once | RESPIRATORY_TRACT | Status: AC | PRN
  Administered 2014-01-07: 2.5 mg via RESPIRATORY_TRACT

## 2014-01-07 MED ORDER — FAMOTIDINE IN NACL 20-0.9 MG/50ML-% IV SOLN
20.0000 mg | Freq: Once | INTRAVENOUS | Status: AC | PRN
  Administered 2014-01-07: 20 mg via INTRAVENOUS
  Filled 2014-01-07: qty 50

## 2014-01-07 MED ORDER — FAMOTIDINE IN NACL 20-0.9 MG/50ML-% IV SOLN
20.0000 mg | Freq: Once | INTRAVENOUS | Status: AC
  Administered 2014-01-07: 20 mg via INTRAVENOUS
  Filled 2014-01-07: qty 50

## 2014-01-07 MED ORDER — SODIUM CHLORIDE 0.9 % IV SOLN
375.0000 mg/m2 | Freq: Once | INTRAVENOUS | Status: AC
  Administered 2014-01-07: 800 mg via INTRAVENOUS
  Filled 2014-01-07: qty 80

## 2014-01-07 MED ORDER — PREDNISONE 50 MG PO TABS
60.0000 mg | ORAL_TABLET | Freq: Every day | ORAL | Status: AC
  Administered 2014-01-07 – 2014-01-10 (×4): 60 mg via ORAL
  Filled 2014-01-07 (×5): qty 1

## 2014-01-07 NOTE — Progress Notes (Signed)
Patient's reaction now fully resolved.  Rituxan resumed at 18:34 per Dr. Virgie Dad order.  Rituxan only to run at 50 mg/hr and will complete in approximately 14 hours.  Inpatient Oncology RN, Lottie Dawson, will follow me at 7 pm and assume monitoring of patient until at least 11 pm.

## 2014-01-07 NOTE — Progress Notes (Signed)
Pt converted to NSR around 2140. Current HR 75-77. Pt remains on Cardizem drip at 5mg /hr.

## 2014-01-07 NOTE — Progress Notes (Signed)
Rituxan was initiated at 50 mg/hr at 1613.  Patient's vitals prior to Rituxan were stable.  After 30 minutes, vitals were still stable and Rituxan was titrated to 100 mg/hr.  At the end of 30 minutes, patient's condition rapidly declined.  He had acute onset of rigors and upper respiratory expiratory wheezing.  Patient's heart rate also increased into the 120's-130's.  Rituxan stopped immediately and Normal saline infusion resumed.  Hypersensitivity protocol initiated.  Patient given 20 mg Pepcid, 25 mg Benadryl, 0.25 mg Epinephrine, 125 mg Solumedrol, and Albuterol inhaler.  Dr. Jana Hakim arrived during early portion of reaction and remained at patient's bedside.  Reaction did not resolve.  Administered another 0.25 mg Epinephrine and 25 mg IV Benadryl per protocol.  Reaction slowly subsiding.  Rigors now resolved but wheezing continues.  Dr. Jana Hakim stated to wait 30 minutes to 1 hour to see if reaction will resolve.  If it does, will restart Rituxan at very slow rate for longer infusion.  If reaction does not resolve soon, will not resume Rituxan.  This RN will remain at bedside until reaction resolves completely.  Zandra Abts [facility]  01/07/2014  6:06 PM

## 2014-01-07 NOTE — Progress Notes (Signed)
Wylan Dang   DOB:1975/04/04   HK#:[id number]   VFI#:[id number]  Subjective: patient is feeling better today; still very SOB with short walks; no pain; family in room   Objective: elderly white man examined in bed Filed Vitals:   01/07/14 0538  BP: 132/54  Pulse: 73  Temp: 97.6 F (36.4 C)  Resp: 16    Body mass index is 29.94 kg/(m^2).  Intake/Output Summary (Last 24 hours) at 01/07/14 1058 Last data filed at 01/07/14 0835  Gross per 24 hour  Intake 1208.75 ml  Output      0 ml  Net 1208.75 ml     CBG (last 3)  No results found for this basename: GLUCAP,  in the last 72 hours   Labs:  Lab Results  Component Value Date   WBC 17.5* 01/07/2014   HGB 11.6* 01/07/2014   HCT 35.9* 01/07/2014   MCV 88.2 01/07/2014   PLT 148* 01/07/2014   NEUTROABS 7.8* 01/07/2014    @LASTCHEMISTRY @  Urine Studies No results found for this basename: UACOL, UAPR, USPG, UPH, UTP, UGL, UKET, UBIL, UHGB, UNIT, UROB, ULEU, UEPI, UWBC, URBC, UBAC, CAST, CRYS, UCOM, BILUA,  in the last 72 hours  Basic Metabolic Panel:  Recent Labs Lab 01/05/14 2142 01/06/14 0645 01/07/14 0438  NA 137 139 138  K 5.5* 4.6 4.7  CL 101 104 102  CO2 23 23 24   GLUCOSE 135* 95 108*  BUN 38* 41* 39*  CREATININE 2.23* 2.14* 1.80*  CALCIUM 8.4 8.1* 8.2*   GFR Estimated Creatinine Clearance: 31.5 ml/min (by C-G formula based on Cr of 1.8). Liver Function Tests:  Recent Labs Lab 01/05/14 2142 01/06/14 0645 01/07/14 0438  AST 75* 66* 55*  ALT 20 18 18   ALKPHOS 100 99 112  BILITOT 0.4 0.4 0.4  PROT 6.0 5.8* 5.6*  ALBUMIN 2.3* 2.1* 2.0*   No results found for this basename: LIPASE, AMYLASE,  in the last 168 hours No results found for this basename: AMMONIA,  in the last 168 hours Coagulation profile  Recent Labs Lab 01/05/14 2142 01/06/14 0645  INR 1.17 1.15    CBC:  Recent Labs Lab 01/05/14 2142 01/06/14 0645 01/07/14 0438  WBC 20.9* 20.6* 17.5*  NEUTROABS 11.5* 9.0* 7.8*  HGB 11.8*  11.1* 11.6*  HCT 35.3* 33.1* 35.9*  MCV 87.8 86.0 88.2  PLT 156 160 148*   Cardiac Enzymes:  Recent Labs Lab 01/06/14 1709 01/06/14 2310 01/07/14 0438  TROPONINI <0.30 <0.30 <0.30   BNP: No components found with this basename: POCBNP,  CBG: No results found for this basename: GLUCAP,  in the last 168 hours D-Dimer No results found for this basename: DDIMER,  in the last 72 hours Hgb A1c  Recent Labs  01/06/14 0445  HGBA1C 6.4*   Lipid Profile No results found for this basename: CHOL, HDL, LDLCALC, TRIG, CHOLHDL, LDLDIRECT,  in the last 72 hours Thyroid function studies No results found for this basename: TSH, T4TOTAL, FREET3, T3FREE, THYROIDAB,  in the last 72 hours Anemia work up No results found for this basename: VITAMINB12, FOLATE, FERRITIN, TIBC, IRON, RETICCTPCT,  in the last 72 hours Microbiology No results found for this or any previous visit (from the past 240 hour(s)).    Studies:  Dg Chest 2 View  01/05/2014   CLINICAL DATA:  Shortness of breath.  EXAM: CHEST  2 VIEW  COMPARISON:  None available.  FINDINGS: The cardiac silhouette, mediastinal and hilar contours are somewhat prominent. The upper mediastinum is slightly  widened and the right pulmonary hila is prominent. There are small bilateral pleural effusions and overlying atelectasis. No edema or pneumothorax. The bony thorax is grossly intact.  IMPRESSION: Prominent superior mediastinal contours and prominent right hilum. Chest CT may be helpful for further evaluation.  Bilateral pleural effusions and bibasilar atelectasis.   Electronically Signed   By: Kalman Jewels M.D.   On: 01/05/2014 22:32   Ct Chest Wo Contrast  01/05/2014   CLINICAL DATA:  Chest pain.  Recent diagnosis of lymphoma.  EXAM: CT CHEST WITHOUT CONTRAST  TECHNIQUE: Multidetector CT imaging of the chest was performed following the standard protocol without IV contrast.  COMPARISON:  None.  FINDINGS: THORACIC INLET/BODY WALL:  Adenopathy at  the thoracic inlet, with a node posterior to the left IJ measuring 15 mm short axis. Bilateral axillary lymphadenopathy, with a solid node on the left measuring 22 mm in short axis on image 18.  MEDIASTINUM:  No cardiomegaly or pericardial effusion. Diffuse mediastinal lymphadenopathy, present in all stations. Index node in the sub- carina region measures 28 mm short axis, larger than on abdominal imaging. Index left anterior juxtadiaphragmatic node is also larger at 14 mm (previously 10 mm). Bilateral hilar lymphadenopathy. Negative esophagus. Diffuse atherosclerosis, including the coronary arteries. No evidence of acute vascular finding.  LUNG WINDOWS:  Small, layering water density pleural effusions bilaterally. There is atelectasis in both lower lobes.  7 mm nodule in the right middle lobe. This will be re-assesed at surveillance imaging.  Spiculated density at the right apex favors pleural/parenchymal scarring.  No evidence of edema, pneumothorax, or pneumonia.  UPPER ABDOMEN:  Diffuse adenopathy in the upper abdominal ligaments. No definitive progression since recent abdominal CT. The spleen is partially imaged but clearly enlarged.  OSSEOUS:  No acute fracture.  No suspicious lytic or blastic lesions.  IMPRESSION: 1. Diffuse lymphoma, with intrathoracic progression since 12/13/2013. 2. Small bilateral pleural effusions with lower lobe atelectasis.   Electronically Signed   By: [name] M.D.   On: 01/05/2014 23:03   Nm Pulmonary Perf And Vent  01/06/2014   CLINICAL DATA:  Shortness of breath question pulmonary embolism, history hypertension, diabetes, lymphoma  EXAM: NUCLEAR MEDICINE VENTILATION - PERFUSION LUNG SCAN  TECHNIQUE: Ventilation images were obtained in multiple projections using inhaled aerosol technetium 99 M DTPA. Perfusion images were obtained in multiple projections after intravenous injection of Tc-49m MAA.  RADIOPHARMACEUTICALS:  41.0 mCi Tc-58m DTPA aerosol inhalation and 5.0 mCi  Tc-7m MAA IV  COMPARISON:  None; correlation chest radiograph and CT chest exams of 01/05/2014  FINDINGS: Ventilation: Mild central airway deposition of tracer. Minimal peripheral regularity of peripheral ventilation in both lungs.  Perfusion: Irregular areas of nonsegmental diminished perfusion at the lateral and posterolateral RIGHT lung and at the posterior LEFT lung, corresponding to pleural effusions identified on chest CT. Small subsegmental perfusion defect RIGHT lower lobe. No additional segmental or subsegmental perfusion defects identified.  Chest CT demonstrates small to moderate-sized pleural effusions bilaterally larger on RIGHT with compressive atelectasis of BILATERAL lower lobes also greater on RIGHT.  IMPRESSION: BILATERAL pleural effusions causing compression of the lungs bilaterally.  Single subsegmental perfusion defect RIGHT lower lobe.  Findings represent a low probability for pulmonary embolism.   Electronically Signed   By: Lavonia Dana M.D.   On: 01/06/2014 12:19    Assessment: 80 y.o. [name] with a new diagnosis of diffuse large-cell, B-cell non-Hodgkin's lymphoma    Plan: I have obtained a copy of the apth report  and given it to the patient for his records. At this point I think it is best to start treatment, which has 3 components:  1-- steroids: will start those today; will need to monitors glc closely  2--rituximab: will give that today as well  3--chemotherapy--will hold off on chemo until 01/10/2014 (modified [facility])  I have discussed the possible side effects, toxicities and complications with Mr Sharrar and his son; he understands the plan and agrees to it.  I will ask one of my partners to drop by and see Mr Holtsclaw tomorrow just to make sure there are no problems from his treatment.  Discussed w Dr Humberto Leep, MD 01/07/2014  10:58 AM

## 2014-01-07 NOTE — Progress Notes (Signed)
Dr. Tyrell Antonio notified of patient's reaction to Rituxan and resolution of reaction.  Also notified that Rituxan has been restarted at a slow rate and to continue until at least 10 am tomorrow.  Zandra Abts [facility]  01/07/2014  7:07 PM

## 2014-01-07 NOTE — Progress Notes (Signed)
Progress Note   Erik Ramirez WNI:[id number] DOB: 1925-11-03 DOA: 01/05/2014 PCP: [name], MD   Brief Narrative:   Erik Ramirez is an 80 y.o. male with PMH of hypertension, dyslipidemia, diabetes, OSA on nocturnal CPAP and recently diagnosed large B cell lymphoma who was admitted on 01/05/14 with chief complaint of a two-day history of progressively worsening dyspnea associated with cough and fever. Upon initial evaluation in the ED, the patient was found to have bilateral pleural effusions with worsening lymphadenopathy on CT scan without contrast. Oncology consultation is pending. On 01/06/14, the patient went into atrial fibrillation with RVR and a Cardizem drip was initiated.   Assessment/Plan:      Acute respiratory failure with hypoxia  Broad differential with progressive lymphoma/lymphadenopathy, community-acquired pneumonia, pulmonary embolism: The differential.  On empiric antibiotics to address possible community-acquired pneumonia. Strep pneumonia negative/Legionella pending.  Continue with Levaquin day, 2.  VQ scan and lower extremity Dopplers negative, so pulmonary embolism ruled out.   2-D echo: Ef 55 %, diastolic dysfunction grade 1.   Continue supportive care with oxygen as needed to maintain saturations.  Repeat chest x ray 9-26 to follow up effusion.      Atrial fibrillation with rapid ventricular response  Converted to sinus rhythm. Rate controlled. Transition from Cardizem Gtt to oral Cardizem.    will monitor on telemetry for 24 hours.   Continue aspirin therapy for now, consider therapeutic anticoagulation. Check chest x ray might need thoracentesis, continue with aspirin for now.     Stage III chronic kidney disease  Current GFR 25-26.  Cr trending down; 2 ---1.8  No prior records.   Strict I and o. Hold IV fluids to avoid pulmonary edema.     Hypertension  Continue  Lopressor. Lisinopril on hold secondary to impaired renal  function.  DC Norvasc, patient now on Cardizem.     Diabetes mellitus with renal complications  Diet controlled, borderline per patient.  hemoglobin A1c 6.4    Obstructive sleep apnea  Continue nocturnal CPAP.    DLBCL (diffuse large B cell lymphoma)  Dr Jana Hakim following. Plan to start prednisone and rituximab today.   CHOP on Monday.     Dyslipidemia  Continue simvastatin      DVT Prophylaxis  Continue heparin.  Code Status: Full. Family Communication: Wife and son at bedside. Disposition Plan: Home when stable.   IV Access:    Peripheral IV   Procedures and diagnostic studies:   Dg Chest 2 View 01/05/2014: Prominent superior mediastinal contours and prominent right hilum. Chest CT may be helpful for further evaluation.  Bilateral pleural effusions and bibasilar atelectasis.     Ct Chest Wo Contrast 01/05/2014: 1. Diffuse lymphoma, with intrathoracic progression since 12/13/2013. 2. Small bilateral pleural effusions with lower lobe atelectasis.   Electronically Signed   By: [name] M.D.   On: 01/05/2014 23:03   Nm Pulmonary Perf And Vent 01/06/2014: BILATERAL pleural effusions causing compression of the lungs bilaterally.  Single subsegmental perfusion defect RIGHT lower lobe.  Findings represent a low probability for pulmonary embolism.      Medical Consultants:    Dr. [name], Oncology.   Other Consultants:    None.   Anti-Infectives:    Levaquin 01/05/14--->  Subjective:    Erik Ramirez feeling better, was able to ambulate on the hall today. Got SOB after walking. Breathing better.   Objective:    Filed Vitals:   01/06/14 1736 01/06/14 2109 01/06/14 2128 01/07/14 3818  BP: 108/77 108/70  132/54  Pulse: 124 112 72 73  Temp: 97.4 F (36.3 C) 98 F (36.7 C)  97.6 F (36.4 C)  TempSrc: Oral Oral  Oral  Resp: 20 20 20 16   Height:      Weight:    86.728 kg (191 lb 3.2 oz)  SpO2: 94% 94% 95% 90%    Intake/Output Summary  (Last 24 hours) at 01/07/14 1241 Last data filed at 01/07/14 0835  Gross per 24 hour  Intake 1208.75 ml  Output      0 ml  Net 1208.75 ml    Exam: Gen:  NAD, weak Cardiovascular:  RRR, No M/R/G Respiratory:  Lungs CTAB, diminished in the bases Gastrointestinal:  Abdomen soft, NT/ND, + BS Extremities:  Trace edema   Data Reviewed:    Labs: Basic Metabolic Panel:  Recent Labs Lab 01/05/14 2142 01/06/14 0645 01/07/14 0438  NA 137 139 138  K 5.5* 4.6 4.7  CL 101 104 102  CO2 23 23 24   GLUCOSE 135* 95 108*  BUN 38* 41* 39*  CREATININE 2.23* 2.14* 1.80*  CALCIUM 8.4 8.1* 8.2*   GFR Estimated Creatinine Clearance: 31.5 ml/min (by C-G formula based on Cr of 1.8). Liver Function Tests:  Recent Labs Lab 01/05/14 2142 01/06/14 0645 01/07/14 0438  AST 75* 66* 55*  ALT 20 18 18   ALKPHOS 100 99 112  BILITOT 0.4 0.4 0.4  PROT 6.0 5.8* 5.6*  ALBUMIN 2.3* 2.1* 2.0*   Coagulation profile  Recent Labs Lab 01/05/14 2142 01/06/14 0645  INR 1.17 1.15    CBC:  Recent Labs Lab 01/05/14 2142 01/06/14 0645 01/07/14 0438  WBC 20.9* 20.6* 17.5*  NEUTROABS 11.5* 9.0* 7.8*  HGB 11.8* 11.1* 11.6*  HCT 35.3* 33.1* 35.9*  MCV 87.8 86.0 88.2  PLT 156 160 148*   BNP (last 3 results)  Recent Labs  01/05/14 2142  PROBNP 343.7   Microbiology No results found for this or any previous visit (from the past 240 hour(s)).   Medications:   . allopurinol  300 mg Oral Daily  . antiseptic oral rinse  7 mL Mouth Rinse q12n4p  . [START ON 01/08/2014] aspirin  325 mg Oral Daily  . atorvastatin  10 mg Oral q1800  . chlorhexidine  15 mL Mouth Rinse BID  . diltiazem  120 mg Oral Daily  . feeding supplement (ENSURE COMPLETE)  237 mL Oral Q24H  . heparin  5,000 Units Subcutaneous 3 times per day  . insulin aspart  0-20 Units Subcutaneous TID WC  . levofloxacin (LEVAQUIN) IV  750 mg Intravenous Q48H  . metoprolol  25 mg Oral BID  . predniSONE  60 mg Oral Q breakfast  .  sodium chloride  3 mL Intravenous Q12H   Continuous Infusions:   Time spent: 35 minutes with > 50% of time discussing current diagnostic test results, clinical impression and plan of care.    LOS: 2 days   Niel Hummer A  [facility] Pager [phone]  *Please refer to [email or web address], password [email or web address] to get updated schedule on who will round on this patient, as hospitalists switch teams weekly. If 7PM-7AM, please contact night-coverage at [email or web address], password [email or web address] for any overnight needs.  01/07/2014, 12:41 PM

## 2014-01-07 NOTE — Evaluation (Signed)
Physical Therapy Evaluation Patient Details Name: Erik Ramirez MRN: [id number] DOB: 04-Jun-1925 Today's Date: 01/07/2014   History of Present Illness  80 y.o. male with PMHx of hypertension, dyslipidemia, diabetes, OSA on nocturnal CPAP and recently diagnosed large B cell lymphoma who was admitted on 01/05/14 with progressively worsening dyspnea.  Upon workup pt found to have bilateral pleural effusions with worsening lymphadenopathy on CT scan without contrast.  On 01/06/14, the patient went into atrial fibrillation with RVR, Cardizem drip was initiated, and pt currently rate controlled.  V/Q scan and dopplers negative.  Clinical Impression  Pt currently with functional limitations due to the deficits listed below (see PT Problem List). Pt will benefit from skilled PT to increase their independence and safety with mobility to allow discharge to the venue listed below.  Pt ambulated in hallway with occasional increased anterior lean and speed requiring assist to correct and avoid fall.  Recommend nsg ambulate with pt using gait belt and pt agreeable not to get up alone for safety.  Son present on evaluation and reports pt will have 24/7 assist upon return home.     Follow Up Recommendations Home health PT;Supervision for mobility/OOB    Equipment Recommendations  Rolling walker with 5" wheels (to be further assessed)    Recommendations for Other Services       Precautions / Restrictions Precautions Precautions: Fall      Mobility  Bed Mobility Overal bed mobility: Needs Assistance Bed Mobility: Supine to Sit;Sit to Supine     Supine to sit: Min assist Sit to supine: Supervision   General bed mobility comments: slight assist for trunk upright  Transfers Overall transfer level: Needs assistance Equipment used: None Transfers: Sit to/from Stand Sit to Stand: Min guard         General transfer comment: verbal cues for safe technique  Ambulation/Gait Ambulation/Gait  assistance: Min assist Ambulation Distance (Feet): 400 Feet Assistive device: None Gait Pattern/deviations: Step-through pattern;Trunk flexed Gait velocity: varying pace   General Gait Details: pt tends to increase speed with anterior lean occasionally requiring min assist to correct and cues to slow down, presents with decreased LE control during gait however pt reports close to baseline  Stairs            Wheelchair Mobility    Modified Rankin (Stroke Patients Only)       Balance Overall balance assessment:  (denies falls)                                           Pertinent Vitals/Pain Pain Assessment: No/denies pain SpO2 on room air during gait at lowest 89% Reapplied O2 [place] upon return to room HR in 90's during gait.    Home Living Family/patient expects to be discharged to:: Private residence Living Arrangements: Spouse/significant other Available Help at Discharge: Family;Available 24 hours/day Type of Home: House Home Access: Level entry     Home Layout: Laundry or work area in [place]: Bedside commode      Prior Function Level of Independence: Independent               Hand Dominance        Extremity/Trunk Assessment               Lower Extremity Assessment: Generalized weakness         Communication   Communication: No difficulties  Cognition  Arousal/Alertness: Awake/alert Behavior During Therapy: WFL for tasks assessed/performed Overall Cognitive Status: Within Functional Limits for tasks assessed                      General Comments      Exercises        Assessment/Plan    PT Assessment Patient needs continued PT services  PT Diagnosis Generalized weakness   PT Problem List Decreased strength;Decreased activity tolerance;Decreased mobility  PT Treatment Interventions DME instruction;Gait training;Functional mobility training;Patient/family education;Therapeutic  activities;Therapeutic exercise;Balance training   PT Goals (Current goals can be found in the Care Plan section) Acute Rehab PT Goals PT Goal Formulation: With patient/family Time For Goal Achievement: 01/14/14 Potential to Achieve Goals: Good    Frequency Min 3X/week   Barriers to discharge        Co-evaluation               End of Session Equipment Utilized During Treatment: Gait belt Activity Tolerance: Patient limited by fatigue Patient left: in bed;with call bell/phone within reach;with bed alarm set           Time: 3903-0092 PT Time Calculation (min): 16 min   Charges:   PT Evaluation $Initial PT Evaluation Tier I: 1 Procedure PT Treatments $Gait Training: 8-22 mins   PT G Codes:          TRUE Garciamartinez,KATHrine E 01/07/2014, 1:28 PM Carmelia Bake, PT, DPT 01/07/2014 Pager: [phone]

## 2014-01-08 ENCOUNTER — Inpatient Hospital Stay (HOSPITAL_COMMUNITY): Payer: Medicare Other

## 2014-01-08 DIAGNOSIS — E875 Hyperkalemia: Secondary | ICD-10-CM

## 2014-01-08 DIAGNOSIS — R0602 Shortness of breath: Secondary | ICD-10-CM

## 2014-01-08 DIAGNOSIS — D72829 Elevated white blood cell count, unspecified: Secondary | ICD-10-CM

## 2014-01-08 LAB — GLUCOSE, CAPILLARY
GLUCOSE-CAPILLARY: 212 mg/dL — AB (ref 70–99)
GLUCOSE-CAPILLARY: 158 mg/dL — AB (ref 70–99)
Glucose-Capillary: 216 mg/dL — ABNORMAL HIGH (ref 70–99)
Glucose-Capillary: 201 mg/dL — ABNORMAL HIGH (ref 70–99)

## 2014-01-08 LAB — CBC WITH DIFFERENTIAL/PLATELET
BASOS PCT: 0 % (ref 0–1)
Basophils Absolute: 0 10*3/uL (ref 0.0–0.1)
EOS PCT: 0 % (ref 0–5)
Eosinophils Absolute: 0 10*3/uL (ref 0.0–0.7)
HEMATOCRIT: 34.6 % — AB (ref 39.0–52.0)
HEMOGLOBIN: 11.6 g/dL — AB (ref 13.0–17.0)
LYMPHS ABS: 2.3 10*3/uL (ref 0.7–4.0)
Lymphocytes Relative: 9 % — ABNORMAL LOW (ref 12–46)
MCH: 28.7 pg (ref 26.0–34.0)
MCHC: 33.5 g/dL (ref 30.0–36.0)
MCV: 85.6 fL (ref 78.0–100.0)
Monocytes Absolute: 0.8 10*3/uL (ref 0.1–1.0)
Monocytes Relative: 3 % (ref 3–12)
NEUTROS ABS: 22.4 10*3/uL — AB (ref 1.7–7.7)
Neutrophils Relative %: 88 % — ABNORMAL HIGH (ref 43–77)
Platelets: 64 10*3/uL — ABNORMAL LOW (ref 150–400)
RBC: 4.04 MIL/uL — AB (ref 4.22–5.81)
RDW: 15 % (ref 11.5–15.5)
WBC: 25.5 10*3/uL — ABNORMAL HIGH (ref 4.0–10.5)

## 2014-01-08 LAB — COMPREHENSIVE METABOLIC PANEL
ALBUMIN: 2 g/dL — AB (ref 3.5–5.2)
ALK PHOS: 138 U/L — AB (ref 39–117)
ALT: 21 U/L (ref 0–53)
ANION GAP: 12 (ref 5–15)
AST: 149 U/L — ABNORMAL HIGH (ref 0–37)
BILIRUBIN TOTAL: 0.3 mg/dL (ref 0.3–1.2)
BUN: 44 mg/dL — ABNORMAL HIGH (ref 6–23)
CHLORIDE: 101 meq/L (ref 96–112)
CO2: 21 mEq/L (ref 19–32)
Calcium: 8 mg/dL — ABNORMAL LOW (ref 8.4–10.5)
Creatinine, Ser: 1.62 mg/dL — ABNORMAL HIGH (ref 0.50–1.35)
GFR calc Af Amer: 43 mL/min — ABNORMAL LOW (ref >90)
GFR calc non Af Amer: 37 mL/min — ABNORMAL LOW (ref >90)
GLUCOSE: 258 mg/dL — AB (ref 70–99)
POTASSIUM: 5.7 meq/L — AB (ref 3.7–5.3)
SODIUM: 134 meq/L — AB (ref 137–147)
Total Protein: 5.9 g/dL — ABNORMAL LOW (ref 6.0–8.3)

## 2014-01-08 LAB — LACTATE DEHYDROGENASE: LDH: 1059 U/L — ABNORMAL HIGH (ref 94–250)

## 2014-01-08 LAB — BODY FLUID CELL COUNT WITH DIFFERENTIAL
EOS FL: 0 %
Lymphs, Fluid: 73 %
Monocyte-Macrophage-Serous Fluid: 22 % — ABNORMAL LOW (ref 50–90)
Neutrophil Count, Fluid: 5 % (ref 0–25)
Total Nucleated Cell Count, Fluid: 3604 cu mm — ABNORMAL HIGH (ref 0–1000)

## 2014-01-08 LAB — BASIC METABOLIC PANEL
Anion gap: 16 — ABNORMAL HIGH (ref 5–15)
BUN: 52 mg/dL — ABNORMAL HIGH (ref 6–23)
CO2: 20 mEq/L (ref 19–32)
Calcium: 8 mg/dL — ABNORMAL LOW (ref 8.4–10.5)
Chloride: 104 mEq/L (ref 96–112)
Creatinine, Ser: 1.89 mg/dL — ABNORMAL HIGH (ref 0.50–1.35)
GFR calc Af Amer: 36 mL/min — ABNORMAL LOW (ref >90)
GFR calc non Af Amer: 31 mL/min — ABNORMAL LOW (ref >90)
GLUCOSE: 207 mg/dL — AB (ref 70–99)
Potassium: 4.5 mEq/L (ref 3.7–5.3)
Sodium: 140 mEq/L (ref 137–147)

## 2014-01-08 LAB — URIC ACID: Uric Acid, Serum: 7.9 mg/dL — ABNORMAL HIGH (ref 4.0–7.8)

## 2014-01-08 LAB — HEMOGLOBIN A1C
Hgb A1c MFr Bld: 6.5 % — ABNORMAL HIGH (ref <5.7)
Mean Plasma Glucose: 140 mg/dL — ABNORMAL HIGH (ref <117)

## 2014-01-08 MED ORDER — SODIUM POLYSTYRENE SULFONATE 15 GM/60ML PO SUSP
15.0000 g | Freq: Once | ORAL | Status: AC
  Administered 2014-01-08: 15 g via ORAL
  Filled 2014-01-08: qty 60

## 2014-01-08 MED ORDER — SODIUM CHLORIDE 0.9 % IV SOLN
6.0000 mg | Freq: Once | INTRAVENOUS | Status: AC
  Administered 2014-01-08: 6 mg via INTRAVENOUS
  Filled 2014-01-08: qty 4

## 2014-01-08 NOTE — Progress Notes (Signed)
Back from thoracentesis, pt reports breathing easier.  02 at 3liters with sats of 95%

## 2014-01-08 NOTE — Progress Notes (Signed)
Around 0320, patient's HR was up to 130's sustained for about 15 min, sinus tachy. No respiratory distress noted. No wheezing or rigor when auscultated. Patient was diaphoretic, that caused for his peripheral IV to come off. IV Rituxan was interrupted from 0330 until a new  IV site was placed and resumed at 0445. Chemo RN Adline Peals was notified. Patient remained alert with some confusion, no complaints of pain, not in respiratory distress. He did on the other hand, complained of difficulty voiding, unable to start urine. Bladder scan done and revealed 570 ml. Kathline Magic NP was made aware, In and out cath done without difficulty. Patient is now resting in bed, IV chemotherapy ongoing, Vital signs stable, HR at 70-80's. We will continue to monitor patient.

## 2014-01-08 NOTE — Progress Notes (Signed)
Progress Note   Erik Ramirez AJG:[id number] DOB: 04-21-1925 DOA: 01/05/2014 PCP: [name], MD   Brief Narrative:   Erik Ramirez is an 80 y.o. male with PMH of hypertension, dyslipidemia, diabetes, OSA on nocturnal CPAP and recently diagnosed large B cell lymphoma who was admitted on 01/05/14 with chief complaint of a two-day history of progressively worsening dyspnea associated with cough and fever. Upon initial evaluation in the ED, the patient was found to have bilateral pleural effusions with worsening lymphadenopathy on CT scan without contrast. Oncology consultation is pending. On 01/06/14, the patient went into atrial fibrillation with RVR and a Cardizem drip was initiated.   Assessment/Plan:      Acute respiratory failure with hypoxia  Broad differential with progressive lymphoma/lymphadenopathy, community-acquired pneumonia, pulmonary embolism: The differential.  On empiric antibiotics to address possible community-acquired pneumonia. Strep pneumonia negative/Legionella negative.  Continue with Levaquin day, 3.  VQ scan and lower extremity Dopplers negative, so pulmonary embolism ruled out.   2-D echo: Ef 55 %, diastolic dysfunction grade 1.   Continue supportive care with oxygen as needed to maintain saturations.  Repeat chest x ray 9-26 showed worsening left opacification and  pleural effusion. Requiring more oxygen, 5 L.  IR to do thoracentesis today.   Hyperkalemia: Will give one dose of kayexalate. Repeat B-met tonight.     Atrial fibrillation with rapid ventricular response  Converted to sinus rhythm. Rate controlled. Transition from Cardizem Gtt to oral Cardizem.    will monitor on telemetry for 24 hours.   Continue aspirin therapy for now, consider therapeutic anticoagulation.    Stage III chronic kidney disease  Current GFR 25-26.  Cr trending down; 2 ---1.8---1.6  No prior records.   Strict I and o. Hold IV fluids to avoid pulmonary edema.       Hypertension  Continue  Lopressor. Lisinopril on hold secondary to impaired renal function.  DC Norvasc, patient now on Cardizem.     Diabetes mellitus with renal complications  Diet controlled, borderline per patient.  hemoglobin A1c 6.4    Obstructive sleep apnea  Continue nocturnal CPAP.    DLBCL (diffuse large B cell lymphoma)  Dr Jana Hakim following. Received prednisone and rituximab 9-25.   He developed reaction to rituximab. He received IV solumedrol, Pepcid, epinephrine. Reaction resolved after that.   He finished slow infusion of rituximab 9-26.  CHOP on Monday.     Dyslipidemia  Will hold statin due to increase LFT.       DVT Prophylaxis  Continue heparin.  Code Status: Full. Family Communication: Wife and son at bedside. Disposition Plan: Home when stable.   IV Access:    Peripheral IV   Procedures and diagnostic studies:   Dg Chest 2 View 01/05/2014: Prominent superior mediastinal contours and prominent right hilum. Chest CT may be helpful for further evaluation.  Bilateral pleural effusions and bibasilar atelectasis.     Ct Chest Wo Contrast 01/05/2014: 1. Diffuse lymphoma, with intrathoracic progression since 12/13/2013. 2. Small bilateral pleural effusions with lower lobe atelectasis.   Electronically Signed   By: [name] M.D.   On: 01/05/2014 23:03   Nm Pulmonary Perf And Vent 01/06/2014: BILATERAL pleural effusions causing compression of the lungs bilaterally.  Single subsegmental perfusion defect RIGHT lower lobe.  Findings represent a low probability for pulmonary embolism.      Medical Consultants:    Dr. [name], Oncology.   Other Consultants:    None.   Anti-Infectives:    Levaquin  01/05/14--->  Subjective:    Erik Ramirez over all feels better than when he came. He is SOB. Denies abdominal pain.   Objective:    Filed Vitals:   01/08/14 0203 01/08/14 0228 01/08/14 0606 01/08/14 1100  BP: 118/85 129/69  128/75   Pulse: 125 89 78   Temp: 97.7 F (36.5 C) 97.8 F (36.6 C) 97.8 F (36.6 C)   TempSrc: Oral Axillary Oral   Resp:  20 22   Height:      Weight:   85.276 kg (188 lb)   SpO2: 93% 92% 91% 95%    Intake/Output Summary (Last 24 hours) at 01/08/14 1150 Last data filed at 01/08/14 1035  Gross per 24 hour  Intake    860 ml  Output    600 ml  Net    260 ml    Exam: Gen:  NAD, weak Cardiovascular:  RRR, No M/R/G Respiratory:   Diminished bilaterally.  Gastrointestinal:  Abdomen soft, NT/ND, + BS Extremities:  Trace edema   Data Reviewed:    Labs: Basic Metabolic Panel:  Recent Labs Lab 01/05/14 2142 01/06/14 0645 01/07/14 0438 01/08/14 0517  NA 137 139 138 134*  K 5.5* 4.6 4.7 5.7*  CL 101 104 102 101  CO2 _0 GLUCOSE 135* 95 108* 258*  BUN 38* 41* 39* 44*  CREATININE 2.23* 2.14* 1.80* 1.62*  CALCIUM 8.4 8.1* 8.2* 8.0*   GFR Estimated Creatinine Clearance: 34.8 ml/min (by C-G formula based on Cr of 1.62). Liver Function Tests:  Recent Labs Lab 01/05/14 2142 01/06/14 0645 01/07/14 0438 01/08/14 0517  AST 75* 66* 55* 149*  ALT _1 ALKPHOS 100 99 112 138*  BILITOT 0.4 0.4 0.4 0.3  PROT 6.0 5.8* 5.6* 5.9*  ALBUMIN 2.3* 2.1* 2.0* 2.0*   Coagulation profile  Recent Labs Lab 01/05/14 2142 01/06/14 0645  INR 1.17 1.15    CBC:  Recent Labs Lab 01/05/14 2142 01/06/14 0645 01/07/14 0438 01/08/14 0517  WBC 20.9* 20.6* 17.5* 25.5*  NEUTROABS 11.5* 9.0* 7.8* 22.4*  HGB 11.8* 11.1* 11.6* 11.6*  HCT 35.3* 33.1* 35.9* 34.6*  MCV 87.8 86.0 88.2 85.6  PLT 156 160 148* 64*   BNP (last 3 results)  Recent Labs  01/05/14 2142  PROBNP 343.7   Microbiology No results found for this or any previous visit (from the past 240 hour(s)).   Medications:   . allopurinol  300 mg Oral Daily  . antiseptic oral rinse  7 mL Mouth Rinse q12n4p  . aspirin  325 mg Oral Daily  . atorvastatin  10 mg Oral q1800  . chlorhexidine  15 mL  Mouth Rinse BID  . diltiazem  120 mg Oral Daily  . feeding supplement (ENSURE COMPLETE)  237 mL Oral Q24H  . heparin  5,000 Units Subcutaneous 3 times per day  . insulin aspart  0-20 Units Subcutaneous TID WC  . levofloxacin (LEVAQUIN) IV  750 mg Intravenous Q48H  . metoprolol  25 mg Oral BID  . predniSONE  60 mg Oral Q breakfast  . rasburicase (ELITEK) IV infusion  6 mg Intravenous Once  . sodium chloride  3 mL Intravenous Q12H   Continuous Infusions:   Time spent: 35 minutes with > 50% of time discussing current diagnostic test results, clinical impression and plan of care.    LOS: 3 days   Niel Hummer A  [facility] Pager [phone]  *Please refer to [email or web address], password [email or web address] to get updated  schedule on who will round on this patient, as hospitalists switch teams weekly. If 7PM-7AM, please contact night-coverage at [email or web address], password [email or web address] for any overnight needs.  01/08/2014, 11:50 AM

## 2014-01-08 NOTE — Procedures (Addendum)
Small to moderate effusion on left, moderate effusion on right Successful US guided right thoracentesis. Yielded 1 L of bloody pleural fluid. Pt tolerated procedure well. No immediate complications.  Specimen was sent for labs. CXR ordered.  Ascencion Dike PA-C 01/08/2014 12:31 PM

## 2014-01-08 NOTE — Progress Notes (Signed)
Pt refuses CPAP at this time. He will call RT if he wants it later.

## 2014-01-08 NOTE — Progress Notes (Signed)
SUBJECTIVE: Breathing continues to be labored and a  OBJECTIVE PHYSICAL EXAMINATION: ECOG PERFORMANCE STATUS: 3 - Symptomatic, >50% confined to bed  Filed Vitals:   01/08/14 0606  BP: 128/75  Pulse: 78  Temp: 97.8 F (36.6 C)  Resp: 22   Filed Weights   01/06/14 0039 01/07/14 0538 01/08/14 0606  Weight: 186 lb (84.369 kg) 191 lb 3.2 oz (86.728 kg) 188 lb (85.276 kg)    GENERAL:alert, shortness of breath even to talk SKIN: skin color, texture, turgor are normal, no rashes or significant lesions EYES: normal, Conjunctiva are pink and non-injected, sclera clear OROPHARYNX:no exudate, no erythema and lips, buccal mucosa, and tongue normal  NECK: supple, thyroid normal size, non-tender, without nodularity LYMPH:  no palpable lymphadenopathy in the cervical, axillary or inguinal LUNGS: Diminished breath sounds in both lung bases and fine crackles HEART: regular rate & rhythm and no murmurs and no lower extremity edema ABDOMEN:abdomen soft, non-tender and normal bowel sounds Musculoskeletal:no cyanosis of digits and no clubbing  NEURO: alert & oriented x 3 with fluent speech, no focal motor/sensory deficits  LABORATORY DATA:  I have reviewed the data as listed @LASTCHEMISTRY @  Lab Results  Component Value Date   WBC 25.5* 01/08/2014   HGB 11.6* 01/08/2014   HCT 34.6* 01/08/2014   MCV 85.6 01/08/2014   PLT 64* 01/08/2014   NEUTROABS 22.4* 01/08/2014    ASSESSMENT AND PLAN:  1. persistent shortness of breath: Reviewed the chest x-ray from this morning with the patient, it appears that the pleural effusions appear to be worse than on 01/05/2014. I recommended that he should undergo thoracentesis. Patient appears to be in agreement. I will request interventional radiology for their assistance in thoracentesis for therapeutic purposes.  2. diffuse large B-cell lymphoma: Status post Rituxan and prednisone. With the plan to initiate CHOP chemotherapy on Monday. Patient had Rituxan  infusion reaction. Infusion rate had to be slowed down but he was able to tolerate the rest of the treatment.  3. hyperkalemia with elevated uric acid of 7.9. I would like to give him a dose of Rasburicase today.  4. leukocytosis: Related to prednisone therapy.  5. thrombocytopenia: Probably related to lymphoma but it had declined since yesterday. We will need to watch and monitor this for the time being. Transfuse platelets if needed for bleeding symptoms.

## 2014-01-08 NOTE — Progress Notes (Signed)
RN placed patient on his CPAP. RT checked CPAP setting. Pressure is 10 cmH2O. Sterile water added to water chamber. Patient seems to be tolerating well. 5 liters oxygen is bleed into tubing. RT will continue to monitor.

## 2014-01-09 ENCOUNTER — Inpatient Hospital Stay (HOSPITAL_COMMUNITY): Payer: Medicare Other

## 2014-01-09 DIAGNOSIS — R7989 Other specified abnormal findings of blood chemistry: Secondary | ICD-10-CM

## 2014-01-09 DIAGNOSIS — N189 Chronic kidney disease, unspecified: Secondary | ICD-10-CM

## 2014-01-09 LAB — CBC WITH DIFFERENTIAL/PLATELET
BASOS ABS: 0 10*3/uL (ref 0.0–0.1)
Basophils Relative: 0 % (ref 0–1)
EOS PCT: 0 % (ref 0–5)
Eosinophils Absolute: 0 10*3/uL (ref 0.0–0.7)
HEMATOCRIT: 30.8 % — AB (ref 39.0–52.0)
Hemoglobin: 10.4 g/dL — ABNORMAL LOW (ref 13.0–17.0)
Lymphocytes Relative: 10 % — ABNORMAL LOW (ref 12–46)
Lymphs Abs: 1.7 10*3/uL (ref 0.7–4.0)
MCH: 28.6 pg (ref 26.0–34.0)
MCHC: 33.8 g/dL (ref 30.0–36.0)
MCV: 84.6 fL (ref 78.0–100.0)
MONO ABS: 0.7 10*3/uL (ref 0.1–1.0)
Monocytes Relative: 4 % (ref 3–12)
NEUTROS ABS: 14.6 10*3/uL — AB (ref 1.7–7.7)
Neutrophils Relative %: 86 % — ABNORMAL HIGH (ref 43–77)
Platelets: 62 10*3/uL — ABNORMAL LOW (ref 150–400)
RBC: 3.64 MIL/uL — ABNORMAL LOW (ref 4.22–5.81)
RDW: 15.1 % (ref 11.5–15.5)
WBC: 17 10*3/uL — ABNORMAL HIGH (ref 4.0–10.5)

## 2014-01-09 LAB — GLUCOSE, CAPILLARY
Glucose-Capillary: 191 mg/dL — ABNORMAL HIGH (ref 70–99)
Glucose-Capillary: 184 mg/dL — ABNORMAL HIGH (ref 70–99)
Glucose-Capillary: 244 mg/dL — ABNORMAL HIGH (ref 70–99)
Glucose-Capillary: 183 mg/dL — ABNORMAL HIGH (ref 70–99)

## 2014-01-09 LAB — COMPREHENSIVE METABOLIC PANEL
ALBUMIN: 2 g/dL — AB (ref 3.5–5.2)
ALT: 19 U/L (ref 0–53)
AST: 137 U/L — ABNORMAL HIGH (ref 0–37)
Alkaline Phosphatase: 121 U/L — ABNORMAL HIGH (ref 39–117)
Anion gap: 13 (ref 5–15)
BUN: 58 mg/dL — ABNORMAL HIGH (ref 6–23)
CALCIUM: 7.9 mg/dL — AB (ref 8.4–10.5)
CO2: 23 mEq/L (ref 19–32)
CREATININE: 1.92 mg/dL — AB (ref 0.50–1.35)
Chloride: 107 mEq/L (ref 96–112)
GFR calc Af Amer: 35 mL/min — ABNORMAL LOW (ref >90)
GFR calc non Af Amer: 30 mL/min — ABNORMAL LOW (ref >90)
Glucose, Bld: 217 mg/dL — ABNORMAL HIGH (ref 70–99)
Potassium: 4.8 mEq/L (ref 3.7–5.3)
Sodium: 143 mEq/L (ref 137–147)
Total Bilirubin: 0.4 mg/dL (ref 0.3–1.2)
Total Protein: 5.6 g/dL — ABNORMAL LOW (ref 6.0–8.3)

## 2014-01-09 LAB — LACTATE DEHYDROGENASE: LDH: 826 U/L — AB (ref 94–250)

## 2014-01-09 LAB — URIC ACID: URIC ACID, SERUM: 2.8 mg/dL — AB (ref 4.0–7.8)

## 2014-01-09 MED ORDER — SODIUM CHLORIDE 0.9 % IV SOLN
INTRAVENOUS | Status: DC
  Administered 2014-01-09 – 2014-01-12 (×2): via INTRAVENOUS

## 2014-01-09 NOTE — Progress Notes (Addendum)
Progress Note   Jennie Bolar ONG:[id number] DOB: 06-22-1925 DOA: 01/05/2014 PCP: [name], MD   Brief Narrative:   Erik Ramirez is an 80 y.o. male with PMH of hypertension, dyslipidemia, diabetes, OSA on nocturnal CPAP and recently diagnosed large B cell lymphoma who was admitted on 01/05/14 with chief complaint of a two-day history of progressively worsening dyspnea associated with cough and fever. Upon initial evaluation in the ED, the patient was found to have bilateral pleural effusions with worsening lymphadenopathy on CT scan without contrast.  On 01/06/14, the patient went into atrial fibrillation with RVR and a Cardizem drip was initiated. Oncology consulted.   Patient was transition from IV Cardizem to oral. He converted to sinus rhythm. Oncology consulted, and patient was started on Rituximab.He developed reaction to rituximab infusion. He received IV solumedrol, Pepcid, epinephrine. Reaction resolved after that. He was able to complete rituximab infusion at low rate. Due to increase uric acid  he received Rasburicase.   On 9-26 patient  developed worsening respiratory failure. Requiring  5 L of oxygen. . A chest x ray showed worsening left opacification and  pleural effusion. He underwent left side thoracentesis, which improved hypoxic respiratory failure.   Assessment/Plan:      Acute respiratory failure with hypoxia  Broad differential with progressive lymphoma/lymphadenopathy, community-acquired pneumonia, pulmonary embolism: The differential.  On empiric antibiotics to address possible community-acquired pneumonia. Strep pneumonia negative/Legionella negative.  Continue with Levaquin day, 4.  VQ scan and lower extremity Dopplers negative, so pulmonary embolism ruled out.   2-D echo: Ef 55 %, diastolic dysfunction grade 1.   Continue supportive care with oxygen as needed to maintain saturations.  Repeat chest x ray 9-26 showed worsening left opacification and   pleural effusion. Requiring more oxygen, 5 L.  Patient S/P  thoracentesis 9-26 yielding 1 L of bloody pleural fluid. Pleural fluid culture no growth to date. Cytology pending.   Will repeat Chest xray on 9-28.     Atrial fibrillation with rapid ventricular response  Converted to sinus rhythm. Rate controlled. Transition from Cardizem Gtt to oral Cardizem.    Will monitor on telemetry.   Continue aspirin therapy for now, consider therapeutic anticoagulation.    Stage III chronic kidney disease  Current GFR 25-26.  Cr trend; 2 ---1.8---1.6 --1.9  No prior records. Will request record.   Strict I and o. gentle hydration.     Hypertension  Continue  Lopressor. Lisinopril on hold secondary to impaired renal function.  DC Norvasc, patient now on Cardizem.     Diabetes mellitus with renal complications  Diet controlled, borderline per patient.  hemoglobin A1c 6.4    Obstructive sleep apnea  Continue nocturnal CPAP.    DLBCL (diffuse large B cell lymphoma)  Dr Jana Hakim following. Received prednisone and rituximab 9-25.   He developed reaction to rituximab. He received IV solumedrol, Pepcid, epinephrine. Reaction resolved after that.   He finished slow infusion of rituximab 9-26.  He received Rasburicase due to increase uric acid.   CHOP on Monday.     Dyslipidemia  Will hold statin due to increase LFT.       DVT Prophylaxis  Addendum: hold  Heparin due to thrombocytopenia, start SCD. Resume heparin when ok by oncologist.   Hyperkalemia: resolved after Kayexalate.    Code Status: Full. Family Communication: Wife and son at bedside. Disposition Plan: Home when stable.   IV Access:    Peripheral IV   Procedures and diagnostic studies:   Dg  Chest 2 View 01/05/2014: Prominent superior mediastinal contours and prominent right hilum. Chest CT may be helpful for further evaluation.  Bilateral pleural effusions and bibasilar atelectasis.     Ct Chest Wo  Contrast 01/05/2014: 1. Diffuse lymphoma, with intrathoracic progression since 12/13/2013. 2. Small bilateral pleural effusions with lower lobe atelectasis.   Electronically Signed   By: [name] M.D.   On: 01/05/2014 23:03   Nm Pulmonary Perf And Vent 01/06/2014: BILATERAL pleural effusions causing compression of the lungs bilaterally.  Single subsegmental perfusion defect RIGHT lower lobe.  Findings represent a low probability for pulmonary embolism.      Medical Consultants:    Dr. [name], Oncology.   Other Consultants:    None.   Anti-Infectives:    Levaquin 01/05/14--->  Subjective:    Erik Ramirez feels better. He is breathing better after thoracentesis.  He denies chest pain, abdominal pain.   Objective:    Filed Vitals:   01/08/14 2051 01/08/14 2126 01/09/14 0624 01/09/14 0626  BP: 153/72   143/63  Pulse: 75 75  72  Temp: 97.5 F (36.4 C)   97.6 F (36.4 C)  TempSrc: Oral   Oral  Resp: 20 20  18   Height:      Weight:   83.734 kg (184 lb 9.6 oz)   SpO2: 94% 96%  94%    Intake/Output Summary (Last 24 hours) at 01/09/14 0955 Last data filed at 01/09/14 0850  Gross per 24 hour  Intake   1110 ml  Output    400 ml  Net    710 ml    Exam: Gen:  NAD, weak Cardiovascular:  RRR, No M/R/G Respiratory:   Diminished bilaterally. No wheezing. Bilateral fine crackles/  Gastrointestinal:  Abdomen soft, NT/ND, + BS Extremities:  Trace edema   Data Reviewed:    Labs: Basic Metabolic Panel:  Recent Labs Lab 01/06/14 0645 01/07/14 0438 01/08/14 0517 01/08/14 1703 01/09/14 0435  NA 139 138 134* 140 143  K 4.6 4.7 5.7* 4.5 4.8  CL 104 102 101 104 107  CO2 23 24 21 20 23   GLUCOSE 95 108* 258* 207* 217*  BUN 41* 39* 44* 52* 58*  CREATININE 2.14* 1.80* 1.62* 1.89* 1.92*  CALCIUM 8.1* 8.2* 8.0* 8.0* 7.9*   GFR Estimated Creatinine Clearance: 29.1 ml/min (by C-G formula based on Cr of 1.92). Liver Function Tests:  Recent Labs Lab  01/05/14 2142 01/06/14 0645 01/07/14 0438 01/08/14 0517 01/09/14 0435  AST 75* 66* 55* 149* 137*  ALT 20 18 18 21 19   ALKPHOS 100 99 112 138* 121*  BILITOT 0.4 0.4 0.4 0.3 0.4  PROT 6.0 5.8* 5.6* 5.9* 5.6*  ALBUMIN 2.3* 2.1* 2.0* 2.0* 2.0*   Coagulation profile  Recent Labs Lab 01/05/14 2142 01/06/14 0645  INR 1.17 1.15    CBC:  Recent Labs Lab 01/05/14 2142 01/06/14 0645 01/07/14 0438 01/08/14 0517 01/09/14 0435  WBC 20.9* 20.6* 17.5* 25.5* 17.0*  NEUTROABS 11.5* 9.0* 7.8* 22.4* 14.6*  HGB 11.8* 11.1* 11.6* 11.6* 10.4*  HCT 35.3* 33.1* 35.9* 34.6* 30.8*  MCV 87.8 86.0 88.2 85.6 84.6  PLT 156 160 148* 64* 62*   BNP (last 3 results)  Recent Labs  01/05/14 2142  PROBNP 343.7   Microbiology Recent Results (from the past 240 hour(s))  BODY FLUID CULTURE     Status: None   Collection Time    01/08/14 12:33 PM      Result Value Ref Range Status  Specimen Description PLEURAL RIGHT   Final   Special Requests NONE   Final   Gram Stain     Final   Value: FEW WBC PRESENT,BOTH PMN AND MONONUCLEAR     NO ORGANISMS SEEN     Performed at [facility]   Culture PENDING   Incomplete   Report Status PENDING   Incomplete     Medications:   . allopurinol  300 mg Oral Daily  . antiseptic oral rinse  7 mL Mouth Rinse q12n4p  . aspirin  325 mg Oral Daily  . chlorhexidine  15 mL Mouth Rinse BID  . diltiazem  120 mg Oral Daily  . feeding supplement (ENSURE COMPLETE)  237 mL Oral Q24H  . heparin  5,000 Units Subcutaneous 3 times per day  . insulin aspart  0-20 Units Subcutaneous TID WC  . levofloxacin (LEVAQUIN) IV  750 mg Intravenous Q48H  . metoprolol  25 mg Oral BID  . predniSONE  60 mg Oral Q breakfast  . sodium chloride  3 mL Intravenous Q12H   Continuous Infusions:   Time spent: 35 minutes with > 50% of time discussing current diagnostic test results, clinical impression and plan of care.    LOS: 4 days   Niel Hummer A  [facility] Pager [phone]  *Please refer to [email or web address], password [email or web address] to get updated schedule on who will round on this patient, as hospitalists switch teams weekly. If 7PM-7AM, please contact night-coverage at [email or web address], password [email or web address] for any overnight needs.  01/09/2014, 9:55 AM

## 2014-01-09 NOTE — Progress Notes (Signed)
ANTIBIOTIC CONSULT NOTE - FOLLOW-UP  Pharmacy Consult for Levaquin Indication: Acute respiratory failure with hypoxia  Assessment 87 yoM c/o SOB. PMH DLBCL, DM.  Noted to have bilateral pleural effusions and lymphadenopathy on CT.  Levaquin per Rx for acute respiratory failure with hypoxia. CXR showed increasing L pleural effusion and thoracentesis done 9/26.  9/24 >> levaquin >>  Tmax: except for fever 100.5 during Rituxan infusion, afebrile throughout admission WBCs: elevated & vacillating (lymphoma) Renal: presented with AKI; was resolving but now trending upward again.  CrCl now 29 ml/min CG  9/25 sputum: no result 9/26 pleural fluid: no org on Gm stain; Cx pending  Goals of Therapy Eradication of infection Appropriate renal dosing of antibiotics   Plan  Continue Levaquin 750 mg IV q48hr.  Breathing appears to have improved after thoracentesis.  If other parameters remain stable, would consider switching to PO before next dose    Allergies  Allergen Reactions  . Rituximab Other (See Comments)    Developed wheezing & rigors when infusion rate increased to 100 mg/hr.  Tolerates rate of 50 mg/hr.  . Caffeine Other (See Comments)    Makes patient feel like he's going to die  . Morphine And Related Other (See Comments)    excitability  . Penicillins Rash    Patient Measurements: Height: 5\' 7"  (170.2 cm) Weight: 184 lb 9.6 oz (83.734 kg) IBW/kg (Calculated) : 66.1   Vital Signs: Temp: 97.6 F (36.4 C) (09/27 0626) Temp src: Oral (09/27 0626) BP: 143/63 mmHg (09/27 0626) Pulse Rate: 72 (09/27 0626) Intake/Output from previous day: 09/26 0701 - 09/27 0700 In: 750 [P.O.:600; IV Piggyback:150] Out: -  Intake/Output from this shift: Total I/O In: 360 [P.O.:360] Out: -   Labs:  Recent Labs  01/07/14 0438 01/08/14 0517 01/08/14 1703 01/09/14 0435  WBC 17.5* 25.5*  --  17.0*  HGB 11.6* 11.6*  --  10.4*  PLT 148* 64*  --  62*  CREATININE 1.80* 1.62* 1.89*  1.92*   Estimated Creatinine Clearance: 29.1 ml/min (by C-G formula based on Cr of 1.92). No results found for this basename: VANCOTROUGH, Corlis Leak, VANCORANDOM, [place], [place], GENTRANDOM, [place], TOBRAPEAK, TOBRARND, AMIKACINPEAK, AMIKACINTROU, AMIKACIN,  in the last 72 hours   Microbiology: Recent Results (from the past 720 hour(s))  BODY FLUID CULTURE     Status: None   Collection Time    01/08/14 12:33 PM      Result Value Ref Range Status   Specimen Description PLEURAL RIGHT   Final   Special Requests NONE   Final   Gram Stain     Final   Value: FEW WBC PRESENT,BOTH PMN AND MONONUCLEAR     NO ORGANISMS SEEN     Performed at [facility]   Culture PENDING   Incomplete   Report Status PENDING   Incomplete    Medical History: Past Medical History  Diagnosis Date  . Hypertension   . Hyperlipemia   . Anxiety   . Lymphadenopathy, abdominal   . Urethral stricture   . Anemia   . BPPV (benign paroxysmal positional vertigo)   . Lymphoma   . Diabetes mellitus without complication   . Obstructive sleep apnea     Medications:  Scheduled:  . allopurinol  300 mg Oral Daily  . antiseptic oral rinse  7 mL Mouth Rinse q12n4p  . aspirin  325 mg Oral Daily  . chlorhexidine  15 mL Mouth Rinse BID  . diltiazem  120 mg Oral Daily  .  feeding supplement (ENSURE COMPLETE)  237 mL Oral Q24H  . heparin  5,000 Units Subcutaneous 3 times per day  . insulin aspart  0-20 Units Subcutaneous TID WC  . levofloxacin (LEVAQUIN) IV  750 mg Intravenous Q48H  . metoprolol  25 mg Oral BID  . predniSONE  60 mg Oral Q breakfast  . sodium chloride  3 mL Intravenous Q12H   Infusions:    Reuel Boom, PharmD Pager: [phone] 01/09/2014, 8:07 AM

## 2014-01-09 NOTE — Progress Notes (Signed)
SUBJECTIVE: Breathing has improved markedly after 1 L right-sided thoracentesis  OBJECTIVE: Awake alert oriented PHYSICAL EXAMINATION: ECOG PERFORMANCE STATUS: 3 - Symptomatic, >50% confined to bed  Filed Vitals:   01/09/14 0626  BP: 143/63  Pulse: 72  Temp: 97.6 F (36.4 C)  Resp: 18   Filed Weights   01/07/14 0538 01/08/14 0606 01/09/14 0624  Weight: 191 lb 3.2 oz (86.728 kg) 188 lb (85.276 kg) 184 lb 9.6 oz (83.734 kg)    GENERAL:alert, mild distress and much more comfortable than before SKIN: skin color, texture, turgor are normal, no rashes or significant lesions EYES: normal, Conjunctiva are pink and non-injected, sclera clear OROPHARYNX:no exudate, no erythema and lips, buccal mucosa, and tongue normal  LUNGS: Diminished breath sounds at both lung bases HEART: regular rate & rhythm and no murmurs and no lower extremity edema ABDOMEN:abdomen soft, non-tender and normal bowel sounds Musculoskeletal:no cyanosis of digits and no clubbing  NEURO: alert & oriented x 3 with fluent speech, no focal motor/sensory deficits  LABORATORY DATA:  I have reviewed the data as listed CMP     Component Value Date/Time   NA 143 01/09/2014 0435   K 4.8 01/09/2014 0435   CL 107 01/09/2014 0435   CO2 23 01/09/2014 0435   GLUCOSE 217* 01/09/2014 0435   BUN 58* 01/09/2014 0435   CREATININE 1.92* 01/09/2014 0435   CALCIUM 7.9* 01/09/2014 0435   PROT 5.6* 01/09/2014 0435   ALBUMIN 2.0* 01/09/2014 0435   AST 137* 01/09/2014 0435   ALT 19 01/09/2014 0435   ALKPHOS 121* 01/09/2014 0435   BILITOT 0.4 01/09/2014 0435   GFRNONAA 30* 01/09/2014 0435   GFRAA 35* 01/09/2014 0435   Lab Results  Component Value Date   WBC 17.0* 01/09/2014   HGB 10.4* 01/09/2014   HCT 30.8* 01/09/2014   MCV 84.6 01/09/2014   PLT 62* 01/09/2014   NEUTROABS 14.6* 01/09/2014    ASSESSMENT AND PLAN: 1. diffuse large B-cell lymphoma: Status post Rituxan and prednisone. Patient developed tumor lysis after treatment. I gave him  Rasburicase and that improved his uric acid level. His potassium level was brought down by Kayexalate. His LDH is trending downwards in the 800s. 2. chronic renal failure: His creatinine continued to climb upwards. He will need to be gently hydrated. I discussed with him that if his creatinine clearance drops below 10, then Cytoxan dose may need to be decreased. 3. elevated AST: This will need to be monitored daily. 4.Thrombocytopenia: Unchanged from yesterday platelets remaining at 62. 5. Leukocytosis: Secondary to being on prednisone. Once the patient received CHOP chemotherapy, he will need to start him daily Neupogen injections. Dr. Jana Hakim or Dr. Alvy Bimler and will be assuming his care tomorrow. Please do not hesitate to call with any questions or concerns.

## 2014-01-09 NOTE — Progress Notes (Signed)
Patient refuses CPAP tonight.  Patient aware to have RN call RT if he changes his mind.

## 2014-01-10 DIAGNOSIS — D696 Thrombocytopenia, unspecified: Secondary | ICD-10-CM

## 2014-01-10 DIAGNOSIS — Z5111 Encounter for antineoplastic chemotherapy: Secondary | ICD-10-CM

## 2014-01-10 LAB — CBC WITH DIFFERENTIAL/PLATELET
BASOS ABS: 0 10*3/uL (ref 0.0–0.1)
BASOS PCT: 0 % (ref 0–1)
EOS PCT: 0 % (ref 0–5)
Eosinophils Absolute: 0 10*3/uL (ref 0.0–0.7)
HEMATOCRIT: 32.8 % — AB (ref 39.0–52.0)
Hemoglobin: 10.8 g/dL — ABNORMAL LOW (ref 13.0–17.0)
Lymphocytes Relative: 10 % — ABNORMAL LOW (ref 12–46)
Lymphs Abs: 1.4 10*3/uL (ref 0.7–4.0)
MCH: 28.5 pg (ref 26.0–34.0)
MCHC: 32.9 g/dL (ref 30.0–36.0)
MCV: 86.5 fL (ref 78.0–100.0)
MONOS PCT: 6 % (ref 3–12)
Monocytes Absolute: 0.9 10*3/uL (ref 0.1–1.0)
Neutro Abs: 11.6 10*3/uL — ABNORMAL HIGH (ref 1.7–7.7)
Neutrophils Relative %: 83 % — ABNORMAL HIGH (ref 43–77)
Platelets: 57 10*3/uL — ABNORMAL LOW (ref 150–400)
RBC: 3.79 MIL/uL — ABNORMAL LOW (ref 4.22–5.81)
RDW: 15.5 % (ref 11.5–15.5)
WBC: 13.9 10*3/uL — ABNORMAL HIGH (ref 4.0–10.5)

## 2014-01-10 LAB — COMPREHENSIVE METABOLIC PANEL
ALT: 21 U/L (ref 0–53)
ANION GAP: 12 (ref 5–15)
AST: 50 U/L — AB (ref 0–37)
Albumin: 2.1 g/dL — ABNORMAL LOW (ref 3.5–5.2)
Alkaline Phosphatase: 122 U/L — ABNORMAL HIGH (ref 39–117)
BUN: 53 mg/dL — AB (ref 6–23)
CALCIUM: 8.1 mg/dL — AB (ref 8.4–10.5)
CO2: 22 mEq/L (ref 19–32)
Chloride: 106 mEq/L (ref 96–112)
Creatinine, Ser: 1.59 mg/dL — ABNORMAL HIGH (ref 0.50–1.35)
GFR, EST AFRICAN AMERICAN: 44 mL/min — AB (ref >90)
GFR, EST NON AFRICAN AMERICAN: 38 mL/min — AB (ref >90)
Glucose, Bld: 187 mg/dL — ABNORMAL HIGH (ref 70–99)
Potassium: 4.2 mEq/L (ref 3.7–5.3)
Sodium: 140 mEq/L (ref 137–147)
Total Bilirubin: 0.5 mg/dL (ref 0.3–1.2)
Total Protein: 5.9 g/dL — ABNORMAL LOW (ref 6.0–8.3)

## 2014-01-10 LAB — GLUCOSE, CAPILLARY
GLUCOSE-CAPILLARY: 210 mg/dL — AB (ref 70–99)
Glucose-Capillary: 146 mg/dL — ABNORMAL HIGH (ref 70–99)
Glucose-Capillary: 178 mg/dL — ABNORMAL HIGH (ref 70–99)
Glucose-Capillary: 210 mg/dL — ABNORMAL HIGH (ref 70–99)

## 2014-01-10 LAB — BETA 2 MICROGLOBULIN, SERUM: BETA 2 MICROGLOBULIN: 12.5 mg/L — AB (ref <=2.51)

## 2014-01-10 LAB — URIC ACID: URIC ACID, SERUM: 1.9 mg/dL — AB (ref 4.0–7.8)

## 2014-01-10 LAB — LACTATE DEHYDROGENASE: LDH: 439 U/L — AB (ref 94–250)

## 2014-01-10 MED ORDER — ALTEPLASE 2 MG IJ SOLR
2.0000 mg | Freq: Once | INTRAMUSCULAR | Status: AC | PRN
  Filled 2014-01-10: qty 2

## 2014-01-10 MED ORDER — METOPROLOL TARTRATE 25 MG PO TABS
25.0000 mg | ORAL_TABLET | Freq: Two times a day (BID) | ORAL | Status: DC
  Administered 2014-01-10 – 2014-01-12 (×4): 25 mg via ORAL
  Filled 2014-01-10 (×5): qty 1

## 2014-01-10 MED ORDER — METOPROLOL TARTRATE 50 MG PO TABS
50.0000 mg | ORAL_TABLET | Freq: Two times a day (BID) | ORAL | Status: DC
  Filled 2014-01-10: qty 1

## 2014-01-10 MED ORDER — SODIUM CHLORIDE 0.9 % IV SOLN
400.0000 mg/m2 | Freq: Once | INTRAVENOUS | Status: AC
  Administered 2014-01-10: 800 mg via INTRAVENOUS
  Filled 2014-01-10: qty 40

## 2014-01-10 MED ORDER — SODIUM CHLORIDE 0.9 % IV SOLN
Freq: Once | INTRAVENOUS | Status: AC
  Administered 2014-01-10: 16 mg via INTRAVENOUS
  Filled 2014-01-10: qty 8

## 2014-01-10 MED ORDER — VINCRISTINE SULFATE CHEMO INJECTION 1 MG/ML
1.0000 mg | Freq: Once | INTRAVENOUS | Status: AC
  Administered 2014-01-10: 1 mg via INTRAVENOUS
  Filled 2014-01-10: qty 1

## 2014-01-10 MED ORDER — HEPARIN SOD (PORK) LOCK FLUSH 100 UNIT/ML IV SOLN: 250.0000 [IU] | Freq: Once | INTRAVENOUS | Status: AC | PRN

## 2014-01-10 MED ORDER — DOXORUBICIN HCL CHEMO IV INJECTION 2 MG/ML
25.0000 mg/m2 | Freq: Once | INTRAVENOUS | Status: AC
  Administered 2014-01-10: 50 mg via INTRAVENOUS
  Filled 2014-01-10: qty 25

## 2014-01-10 MED ORDER — HEPARIN SOD (PORK) LOCK FLUSH 100 UNIT/ML IV SOLN: 500.0000 [IU] | Freq: Once | INTRAVENOUS | Status: AC | PRN

## 2014-01-10 MED ORDER — SODIUM CHLORIDE 0.9 % IJ SOLN: 3.0000 mL | INTRAMUSCULAR | Status: DC | PRN

## 2014-01-10 MED ORDER — SODIUM CHLORIDE 0.9 % IJ SOLN: 10.0000 mL | INTRAMUSCULAR | Status: DC | PRN

## 2014-01-10 MED ORDER — SODIUM CHLORIDE 0.9 % IV SOLN
Freq: Once | INTRAVENOUS | Status: AC
  Administered 2014-01-10: 15:00:00 via INTRAVENOUS

## 2014-01-10 MED ORDER — HOT PACK MISC ONCOLOGY
1.0000 | Freq: Once | Status: AC | PRN
  Filled 2014-01-10: qty 1

## 2014-01-10 MED ORDER — COLD PACK MISC ONCOLOGY
1.0000 | Freq: Once | Status: AC | PRN
  Filled 2014-01-10: qty 1

## 2014-01-10 NOTE — Progress Notes (Signed)
Erik Ramirez   DOB:12/25/1925   HM#:[id number]   ZMO#:[id number]  Subjective:. Weekend events reviewed. I was here Fri Pm while pt had his Rituxan reaction. He was able however to complete the infusion at a slower rate. He then had a R thoracentesis SAT, with good clinical response (improved breathing). Over the weekend he had a "mini-tumor lysis" with rise in LDH and uric acid, and received rasburicase. His platelets also dropped and his lovenox was stopped. He tells me this AM he "feels better." Is eating breakfast in bed with da in law in room   Objective: elderly white man examined in bed Filed Vitals:   01/10/14 0602  BP: 149/80  Pulse: 68  Temp: 97.8 F (36.6 C)  Resp: 20    Body mass index is 28.09 kg/(m^2).  Intake/Output Summary (Last 24 hours) at 01/10/14 0816 Last data filed at 01/10/14 0300  Gross per 24 hour  Intake    930 ml  Output   1000 ml  Net    -70 ml   Lungs no rales or wheezes, auscultated anterolaterally Heart RRR abd soft, NT, +BS  CBG (last 3)   Recent Labs  01/09/14 1643 01/09/14 2241 01/10/14 0734  GLUCAP 244* 183* 146*     Labs:  Lab Results  Component Value Date   WBC 13.9* 01/10/2014   HGB 10.8* 01/10/2014   HCT 32.8* 01/10/2014   MCV 86.5 01/10/2014   PLT 57* 01/10/2014   NEUTROABS 11.6* 01/10/2014    @LASTCHEMISTRY @  Urine Studies No results found for this basename: UACOL, UAPR, USPG, UPH, UTP, UGL, UKET, UBIL, UHGB, UNIT, UROB, ULEU, UEPI, UWBC, URBC, UBAC, CAST, CRYS, UCOM, BILUA,  in the last 72 hours  Basic Metabolic Panel:  Recent Labs Lab 01/07/14 0438 01/08/14 0517 01/08/14 1703 01/09/14 0435 01/10/14 0432  NA 138 134* 140 143 140  K 4.7 5.7* 4.5 4.8 4.2  CL 102 101 104 107 106  CO2 24 21 20 23 22   GLUCOSE 108* 258* 207* 217* 187*  BUN 39* 44* 52* 58* 53*  CREATININE 1.80* 1.62* 1.89* 1.92* 1.59*  CALCIUM 8.2* 8.0* 8.0* 7.9* 8.1*   GFR Estimated Creatinine Clearance: 34.7 ml/min (by C-G formula based on Cr of  1.59). Liver Function Tests:  Recent Labs Lab 01/06/14 0645 01/07/14 0438 01/08/14 0517 01/09/14 0435 01/10/14 0432  AST 66* 55* 149* 137* 50*  ALT 18 18 21 19 21   ALKPHOS 99 112 138* 121* 122*  BILITOT 0.4 0.4 0.3 0.4 0.5  PROT 5.8* 5.6* 5.9* 5.6* 5.9*  ALBUMIN 2.1* 2.0* 2.0* 2.0* 2.1*   No results found for this basename: LIPASE, AMYLASE,  in the last 168 hours No results found for this basename: AMMONIA,  in the last 168 hours Coagulation profile  Recent Labs Lab 01/05/14 2142 01/06/14 0645  INR 1.17 1.15    CBC:  Recent Labs Lab 01/06/14 0645 01/07/14 0438 01/08/14 0517 01/09/14 0435 01/10/14 0432  WBC 20.6* 17.5* 25.5* 17.0* 13.9*  NEUTROABS 9.0* 7.8* 22.4* 14.6* 11.6*  HGB 11.1* 11.6* 11.6* 10.4* 10.8*  HCT 33.1* 35.9* 34.6* 30.8* 32.8*  MCV 86.0 88.2 85.6 84.6 86.5  PLT 160 148* 64* 62* 57*   Cardiac Enzymes:  Recent Labs Lab 01/06/14 1709 01/06/14 2310 01/07/14 0438  TROPONINI <0.30 <0.30 <0.30   BNP: No components found with this basename: POCBNP,  CBG:  Recent Labs Lab 01/09/14 0726 01/09/14 1148 01/09/14 1643 01/09/14 2241 01/10/14 0734  GLUCAP 191* 184* 244* 183* 146*  D-Dimer No results found for this basename: DDIMER,  in the last 72 hours Hgb A1c  Recent Labs  01/08/14 0517  HGBA1C 6.5*   Lipid Profile No results found for this basename: CHOL, HDL, LDLCALC, TRIG, CHOLHDL, LDLDIRECT,  in the last 72 hours Thyroid function studies No results found for this basename: TSH, T4TOTAL, FREET3, T3FREE, THYROIDAB,  in the last 72 hours Anemia work up No results found for this basename: VITAMINB12, FOLATE, FERRITIN, TIBC, IRON, RETICCTPCT,  in the last 72 hours Microbiology Recent Results (from the past 240 hour(s))  BODY FLUID CULTURE     Status: None   Collection Time    01/08/14 12:33 PM      Result Value Ref Range Status   Specimen Description PLEURAL RIGHT   Final   Special Requests NONE   Final   Gram Stain     Final    Value: FEW WBC PRESENT,BOTH PMN AND MONONUCLEAR     NO ORGANISMS SEEN     Performed at [facility]   Culture     Final   Value: NO GROWTH 1 DAY     Performed at [facility] PENDING   Incomplete   Results for Erik Ramirez, Erik Ramirez (MRN [id number]) as of 01/10/2014 08:05  Ref. Range 01/08/2014 05:17 01/09/2014 04:35 01/10/2014 04:32  Uric Acid, Serum Latest Range: 4.0-7.8 mg/dL 7.9 (H) 2.8 (L) 1.9 (L)   Results for Erik Ramirez, Erik Ramirez (MRN [id number]) as of 01/10/2014 08:05  Ref. Range 01/07/2014 04:38 01/08/2014 05:17 01/09/2014 04:35 01/10/2014 04:32  LDH Latest Range: 94-250 U/L 354 (H) 1059 (H) 826 (H) 439 (H)  Results for Erik Ramirez, Erik Ramirez (MRN [id number]) as of 01/10/2014 08:05  Ref. Range 01/09/2014 07:26 01/09/2014 11:48 01/09/2014 16:43 01/09/2014 22:41 01/10/2014 07:34  Glucose-Capillary Latest Range: 70-99 mg/dL 191 (H) 184 (H) 244 (H) 183 (H) 146 (H)    Studies:  Dg Chest 1 View  01/08/2014   CLINICAL DATA:  Post thoracentesis.  EXAM: CHEST - 1 VIEW  COMPARISON:  01/08/2014  FINDINGS: Improved aeration at the right lung base consistent with a recent thoracentesis. There is decreased right pleural fluid. Persistent densities at the left lung base suggest pleural fluid and atelectasis. Heart size is grossly stable. The trachea is midline.  IMPRESSION: Negative for a pneumothorax following the right thoracentesis. Decreased right pleural effusion.  Left basilar densities are compatible with pleural fluid.   Electronically Signed   By: Markus Daft M.D.   On: 01/08/2014 13:48   Dg Chest 2 View  01/09/2014   CLINICAL DATA:  Pleural effusion.  EXAM: CHEST  2 VIEW  COMPARISON:  January 08, 2014.  FINDINGS: Stable cardiomediastinal silhouette. No pneumothorax is noted. Minimal right pleural effusion is noted. Otherwise right lung is clear. Stable mild left pleural effusion is noted with possible underlying atelectasis.  IMPRESSION: Stable bilateral pleural effusions with left  greater than right.   Electronically Signed   By: Sabino Dick M.D.   On: 01/09/2014 15:46   Dg Chest 2 View  01/08/2014   CLINICAL DATA:  Followup effusion. Cough, shortness of breath and weakness.  EXAM: CHEST  2 VIEW  COMPARISON:  01/05/2014 as well as chest CT 01/05/2014  FINDINGS: Lungs are hypoinflated with persistent hazy right basilar opacification compatible with known small effusion and atelectasis. Slight worsening opacification the left base compatible known moderate effusions/atelectasis. Cannot completely exclude infection in the lung bases. Prominence of the right hilar region compatible with known adenopathy in  this patient with a history of lymphoma. Cardiomediastinal silhouette and remainder the exam is unchanged.  IMPRESSION: Stable right base opacification compatible with known small effusion and associated atelectasis. Slight worsening left base opacification compatible with moderate left effusion and atelectasis. Cannot exclude infection in the lung bases.  Prominent right hilum compatible is known adenopathy in this patient with history of lymphoma.   Electronically Signed   By: Marin Olp M.D.   On: 01/08/2014 10:18   US Thoracentesis Asp Pleural Space W/img Guide  01/08/2014   CLINICAL DATA:  Shortness of breath, bilateral pleural effusions. History of lymphoma. Request diagnostic and therapeutic thoracentesis.  EXAM: ULTRASOUND GUIDED RIGHT THORACENTESIS  COMPARISON:  None  PROCEDURE: An ultrasound guided thoracentesis was thoroughly discussed with the patient and questions answered. The benefits, risks, alternatives and complications were also discussed. The patient understands and wishes to proceed with the procedure. Written consent was obtained.  Ultrasound of the chest finds small to moderate size left pleural effusion and a moderate to large right pleural effusion. Right side is chosen for thoracentesis.  Ultrasound was performed to localize and mark an adequate pocket of  fluid in the right chest. The area was then prepped and draped in the normal sterile fashion. 1% Lidocaine was used for local anesthesia. Under ultrasound guidance a 19 gauge Yueh catheter was introduced. Thoracentesis was performed. The catheter was removed and a dressing applied.  Complications:  None immediate  FINDINGS: A total of approximately 1 L of bloody pleural fluid was removed. A fluid sample wassent for laboratory analysis.  IMPRESSION: Successful ultrasound guided right thoracentesis yielding 1 L of pleural fluid.  Read by: Ascencion Dike PA-C   Electronically Signed   By: Daryll Brod M.D.   On: 01/08/2014 12:59    Assessment: 80 y.o. [name] with a new diagnosis of diffuse large-cell, B-cell non-Hodgkin's lymphoma (1) rituximab given 01/07/2014, with initia; infusion reaction but able to receive entire dose that evening; to be repeated every 21 days (2) prednisone started 01/07/2014, continu total 5 days (3) dose-reduced CHOP to proceed 01/10/2014    Plan: I have obtained a copy of the apth report and given it to the patient for his records. At this point I think it is best to start treatment, which has 3 components:  1-- steroids: will start those today; will need to monitors glc closely  2--rituximab: will give that today as well  3--chemotherapy--will hold off on chemo until 01/10/2014 (modified CHOP)  OTHER PROBLEMS: (a) thrombocytopenia: would hold anticoagulation for next 48 hours or so if OK w cardiology; his platelet count is likely to fall furthe with today's chemo; there is no contraindication to platelet transfusion if bleeding becomes an issue  (b) tumor lysis syndrome: very favorable initial reaction to treatment; need to continue to monitor LDH, uric acid, electrolytes daily  (c) AKI: creatinin is improving  (d) A-fib-- has converted to SR-- further management per cardiology  Today I again discussed the Dx, Px and Rx with the patient and his da-in-law. The  goal of treatment in his case is cure; however we will not be able to give him full-dose CHOP and that will affect %s. He is aware of the possible toxicities, side effects and complications of these agents. He agrees to proceed.  Hopefully if he becmoes more ambulatory by [facility] or so we might be looking at d/c with close outpatient follow-up in our clinic by the end of this week  Appreciate Drs Tyrell Antonio and Zamora's  help!  Chauncey Cruel, MD 01/10/2014  8:16 AM

## 2014-01-10 NOTE — Progress Notes (Signed)
Physical Therapy Treatment Patient Details Name: Erik Ramirez MRN: [id number] DOB: [birth year] Today's Date: 01/10/2014    History of Present Illness 80 y.o. male with PMHx of hypertension, dyslipidemia, diabetes, OSA on nocturnal CPAP and recently diagnosed large B cell lymphoma who was admitted on 01/05/14 with progressively worsening dyspnea.  Upon workup pt found to have bilateral pleural effusions with worsening lymphadenopathy on CT scan without contrast.  On 01/06/14, the patient went into atrial fibrillation with RVR, Cardizem drip was initiated, and pt currently rate controlled.  V/Q scan and dopplers negative.  01/08/14 pt had thoracentesis.    PT Comments    *Pt ambulated 400' with min hand held assist for balance, he is declining trial of RW.  SaO2 95% on 6L O2 walking. HR 60. **  Follow Up Recommendations  Home health PT;Supervision for mobility/OOB     Equipment Recommendations  Rolling walker with 5" wheels (to be further assessed)    Recommendations for Other Services       Precautions / Restrictions Precautions Precautions: Fall Restrictions Weight Bearing Restrictions: No    Mobility  Bed Mobility Overal bed mobility: Needs Assistance Bed Mobility: Supine to Sit;Sit to Supine     Supine to sit: Min assist     General bed mobility comments: slight assist for trunk upright  Transfers Overall transfer level: Needs assistance Equipment used: None Transfers: Sit to/from Stand Sit to Stand: Min guard         General transfer comment: verbal cues for safe technique  Ambulation/Gait Ambulation/Gait assistance: Min assist;Min guard Ambulation Distance (Feet): 400 Feet Assistive device: 1 person hand held assist Gait Pattern/deviations: Step-through pattern Gait velocity: varying pace   General Gait Details: intermittent min HHA for balance, pt tended to lean left, pt refusing RW   Stairs            Wheelchair Mobility    Modified Rankin  (Stroke Patients Only)       Balance Overall balance assessment: Needs assistance   Sitting balance-Leahy Scale: Good       Standing balance-Leahy Scale: Fair                      Cognition Arousal/Alertness: Awake/alert Behavior During Therapy: WFL for tasks assessed/performed Overall Cognitive Status: Within Functional Limits for tasks assessed                      Exercises      General Comments        Pertinent Vitals/Pain Pain Assessment: No/denies pain    Home Living                      Prior Function            PT Goals (current goals can now be found in the care plan section) Acute Rehab PT Goals PT Goal Formulation: With patient/family Time For Goal Achievement: 01/14/14 Potential to Achieve Goals: Good Progress towards PT goals: Progressing toward goals    Frequency  Min 3X/week    PT Plan Current plan remains appropriate    Co-evaluation             End of Session Equipment Utilized During Treatment: Gait belt;Oxygen Activity Tolerance: Patient limited by fatigue Patient left: with call bell/phone within reach;in chair;with family/visitor present     Time: 3754-3606 PT Time Calculation (min): 19 min  Charges:  $Gait Training: 8-22 mins  G Codes:      Philomena Doheny 01/10/2014, 3:23 PM [phone]

## 2014-01-10 NOTE — Progress Notes (Signed)
Report called to Charlsie Merles, RN.  Pt taken to 1328 via bed.

## 2014-01-10 NOTE — Progress Notes (Signed)
Patient continues to refuse nocturnal CPAP. No equipment in room at this time. Patient states that his family has taken his home equipment back home since he has not been using it this admission. RN aware.

## 2014-01-10 NOTE — Progress Notes (Signed)
Progress Note   Erik Ramirez LGX:[id number] DOB: November 29, 1925 DOA: 01/05/2014 PCP: [name], MD   Brief Narrative:   Erik Ramirez is an 80 y.o. male with PMH of hypertension, dyslipidemia, diabetes, OSA on nocturnal CPAP and recently diagnosed large B cell lymphoma who was admitted on 01/05/14 with chief complaint of a two-day history of progressively worsening dyspnea associated with cough and fever. Upon initial evaluation in the ED, the patient was found to have bilateral pleural effusions with worsening lymphadenopathy on CT scan without contrast.  On 01/06/14, the patient went into atrial fibrillation with RVR and a Cardizem drip was initiated. Oncology consulted.   Patient was transition from IV Cardizem to oral. He converted to sinus rhythm. Oncology consulted, and patient was started on Rituximab.He developed reaction to rituximab infusion. He received IV solumedrol, Pepcid, epinephrine. Reaction resolved after that. He was able to complete rituximab infusion at low rate. Due to increase uric acid  he received Rasburicase.   On 9-26 patient  developed worsening respiratory failure. Requiring  5 L of oxygen. . A chest x ray showed worsening left opacification and  pleural effusion. He underwent left side thoracentesis, which improved hypoxic respiratory failure.   Assessment/Plan:      Acute respiratory failure with hypoxia  Broad differential with progressive lymphoma/lymphadenopathy, community-acquired pneumonia, pulmonary embolism: The differential.  On empiric antibiotics to address possible community-acquired pneumonia. Strep pneumonia negative/Legionella negative.  Continue with Levaquin day, 4.  VQ scan and lower extremity Dopplers negative, so pulmonary embolism ruled out.   2-D echo: Ef 55 %, diastolic dysfunction grade 1.   Continue supportive care with oxygen as needed to maintain saturations.  Repeat chest x ray 9-26 showed worsening left opacification and   pleural effusion. Requiring more oxygen, 5 L.  Patient S/P  thoracentesis 9-26 yielding 1 L of bloody pleural fluid. Pleural fluid culture no growth to date. Cytology pending.   Repeat chest x-ray on 01/09/2014 showing stable bilateral pleural effusions, left greater than right    Atrial fibrillation with rapid ventricular response  Converted to sinus rhythm. Rate controlled. Transition from Cardizem Gtt to oral Cardizem.    Will monitor on telemetry.   Continue aspirin therapy for now, not candidate for anticoagulation given thrombocytopenia    Stage III chronic kidney disease  Current GFR 25-26.  Cr trend; 2 ---1.8---1.6 --1.9  No prior records. Will request record.   Strict I and o. gentle hydration.     Hypertension  Continue  Lopressor. Lisinopril on hold secondary to impaired renal function.  DC Norvasc, patient now on Cardizem.     Diabetes mellitus with renal complications  Diet controlled, borderline per patient.  hemoglobin A1c 6.4    Obstructive sleep apnea  Continue nocturnal CPAP.    DLBCL (diffuse large B cell lymphoma)  Dr Erik Ramirez following. Received prednisone and rituximab 9-25.   He developed reaction to rituximab. He received IV solumedrol, Pepcid, epinephrine. Reaction resolved after that.   He finished slow infusion of rituximab 9-26.  He received Rasburicase due to increase uric acid.   Plan for chemotherapy on 01/02/2014     Dyslipidemia  Will hold statin due to increase LFT.       DVT Prophylaxis  Addendum: hold  Heparin due to thrombocytopenia, start SCD. Resume heparin when ok by oncologist.   Hyperkalemia: resolved after Kayexalate.    Code Status: Full. Family Communication: Wife and son at bedside. Disposition Plan: Home when stable.   IV Access:  Peripheral IV   Procedures and diagnostic studies:   Dg Chest 2 View 01/05/2014: Prominent superior mediastinal contours and prominent right hilum. Chest CT may be  helpful for further evaluation.  Bilateral pleural effusions and bibasilar atelectasis.     Ct Chest Wo Contrast 01/05/2014: 1. Diffuse lymphoma, with intrathoracic progression since 12/13/2013. 2. Small bilateral pleural effusions with lower lobe atelectasis.   Electronically Signed   By: Erik Ramirez M.D.   On: 01/05/2014 23:03   Nm Pulmonary Perf And Vent 01/06/2014: BILATERAL pleural effusions causing compression of the lungs bilaterally.  Single subsegmental perfusion defect RIGHT lower lobe.  Findings represent a low probability for pulmonary embolism.      Medical Consultants:    Dr. Heath Ramirez, Oncology.   Other Consultants:    None.   Anti-Infectives:    Levaquin 01/05/14--->  Subjective:    Erik Ramirez feels better. He is breathing better after thoracentesis.  He denies chest pain, abdominal pain.   Objective:    Filed Vitals:   01/09/14 2242 01/10/14 0602 01/10/14 1029 01/10/14 1100  BP: 158/56 149/80  164/75  Pulse: 70 68  65  Temp: 97.6 F (36.4 C) 97.8 F (36.6 C)  97.6 F (36.4 C)  TempSrc: Oral Oral  Oral  Resp: 18 20  16   Height:    5\' 7"  (1.702 m)  Weight:  81.375 kg (179 lb 6.4 oz) 87.227 kg (192 lb 4.8 oz) 84.006 kg (185 lb 3.2 oz)  SpO2: 95% 92%  94%    Intake/Output Summary (Last 24 hours) at 01/10/14 1245 Last data filed at 01/10/14 1100  Gross per 24 hour  Intake   1170 ml  Output    900 ml  Net    270 ml    Exam: Gen:  NAD, weak Cardiovascular:  RRR, No M/R/G Respiratory:   Diminished bilaterally. No wheezing. Bilateral fine crackles/  Gastrointestinal:  Abdomen soft, NT/ND, + BS Extremities:  Trace edema   Data Reviewed:    Labs: Basic Metabolic Panel:  Recent Labs Lab 01/07/14 0438 01/08/14 0517 01/08/14 1703 01/09/14 0435 01/10/14 0432  NA 138 134* 140 143 140  K 4.7 5.7* 4.5 4.8 4.2  CL 102 101 104 107 106  CO2 24 21 20 23 22   GLUCOSE 108* 258* 207* 217* 187*  BUN 39* 44* 52* 58* 53*  CREATININE 1.80*  1.62* 1.89* 1.92* 1.59*  CALCIUM 8.2* 8.0* 8.0* 7.9* 8.1*   GFR Estimated Creatinine Clearance: 35.2 ml/min (by C-G formula based on Cr of 1.59). Liver Function Tests:  Recent Labs Lab 01/06/14 0645 01/07/14 0438 01/08/14 0517 01/09/14 0435 01/10/14 0432  AST 66* 55* 149* 137* 50*  ALT 18 18 21 19 21   ALKPHOS 99 112 138* 121* 122*  BILITOT 0.4 0.4 0.3 0.4 0.5  PROT 5.8* 5.6* 5.9* 5.6* 5.9*  ALBUMIN 2.1* 2.0* 2.0* 2.0* 2.1*   Coagulation profile  Recent Labs Lab 01/05/14 2142 01/06/14 0645  INR 1.17 1.15    CBC:  Recent Labs Lab 01/06/14 0645 01/07/14 0438 01/08/14 0517 01/09/14 0435 01/10/14 0432  WBC 20.6* 17.5* 25.5* 17.0* 13.9*  NEUTROABS 9.0* 7.8* 22.4* 14.6* 11.6*  HGB 11.1* 11.6* 11.6* 10.4* 10.8*  HCT 33.1* 35.9* 34.6* 30.8* 32.8*  MCV 86.0 88.2 85.6 84.6 86.5  PLT 160 148* 64* 62* 57*   BNP (last 3 results)  Recent Labs  01/05/14 2142  PROBNP 343.7   Microbiology Recent Results (from the past 240 hour(s))  BODY FLUID CULTURE  Status: None   Collection Time    01/08/14 12:33 PM      Result Value Ref Range Status   Specimen Description PLEURAL RIGHT   Final   Special Requests NONE   Final   Gram Stain     Final   Value: FEW WBC PRESENT,BOTH PMN AND MONONUCLEAR     NO ORGANISMS SEEN     Performed at [facility]   Culture     Final   Value: NO GROWTH 2 DAYS     Performed at [facility]   Report Status PENDING   Incomplete     Medications:   . sodium chloride   Intravenous Once  . allopurinol  300 mg Oral Daily  . antiseptic oral rinse  7 mL Mouth Rinse q12n4p  . aspirin  325 mg Oral Daily  . chlorhexidine  15 mL Mouth Rinse BID  . cyclophosphamide  400 mg/m2 (Treatment Plan Actual) Intravenous Once  . diltiazem  120 mg Oral Daily  . DOXOrubicin  25 mg/m2 (Treatment Plan Actual) Intravenous Once  . feeding supplement (ENSURE COMPLETE)  237 mL Oral Q24H  . insulin aspart  0-20 Units Subcutaneous TID WC  .  levofloxacin (LEVAQUIN) IV  750 mg Intravenous Q48H  . metoprolol  50 mg Oral BID  . ondansetron (ZOFRAN) with dexamethasone (DECADRON) IV   Intravenous Once  . predniSONE  60 mg Oral Q breakfast  . sodium chloride  3 mL Intravenous Q12H  . vinCRIStine (ONCOVIN) CHEMO IV infusion  1 mg Intravenous Once   Continuous Infusions: . sodium chloride 40 mL/hr at 01/09/14 1832    Time spent: 25 min    LOS: 5 days   Kelvin Cellar  [facility] Pager [phone]  *Please refer to [email or web address], password [email or web address] to get updated schedule on who will round on this patient, as hospitalists switch teams weekly. If 7PM-7AM, please contact night-coverage at [email or web address], password [email or web address] for any overnight needs.  01/10/2014, 12:45 PM

## 2014-01-11 ENCOUNTER — Inpatient Hospital Stay (HOSPITAL_COMMUNITY): Payer: Medicare Other

## 2014-01-11 DIAGNOSIS — E883 Tumor lysis syndrome: Secondary | ICD-10-CM

## 2014-01-11 LAB — COMPREHENSIVE METABOLIC PANEL
ALT: 37 U/L (ref 0–53)
AST: 47 U/L — AB (ref 0–37)
Albumin: 2.2 g/dL — ABNORMAL LOW (ref 3.5–5.2)
Alkaline Phosphatase: 119 U/L — ABNORMAL HIGH (ref 39–117)
Anion gap: 11 (ref 5–15)
BUN: 44 mg/dL — ABNORMAL HIGH (ref 6–23)
CHLORIDE: 100 meq/L (ref 96–112)
CO2: 24 mEq/L (ref 19–32)
Calcium: 7.7 mg/dL — ABNORMAL LOW (ref 8.4–10.5)
Creatinine, Ser: 1.14 mg/dL (ref 0.50–1.35)
GFR calc Af Amer: 66 mL/min — ABNORMAL LOW (ref >90)
GFR calc non Af Amer: 57 mL/min — ABNORMAL LOW (ref >90)
Glucose, Bld: 179 mg/dL — ABNORMAL HIGH (ref 70–99)
Potassium: 4.6 mEq/L (ref 3.7–5.3)
Sodium: 135 mEq/L — ABNORMAL LOW (ref 137–147)
TOTAL PROTEIN: 5.6 g/dL — AB (ref 6.0–8.3)
Total Bilirubin: 0.5 mg/dL (ref 0.3–1.2)

## 2014-01-11 LAB — BODY FLUID CULTURE: Culture: NO GROWTH

## 2014-01-11 LAB — CBC WITH DIFFERENTIAL/PLATELET
BASOS ABS: 0 10*3/uL (ref 0.0–0.1)
BASOS PCT: 0 % (ref 0–1)
EOS ABS: 0 10*3/uL (ref 0.0–0.7)
Eosinophils Relative: 0 % (ref 0–5)
HEMATOCRIT: 31.1 % — AB (ref 39.0–52.0)
Hemoglobin: 10.6 g/dL — ABNORMAL LOW (ref 13.0–17.0)
Lymphocytes Relative: 10 % — ABNORMAL LOW (ref 12–46)
Lymphs Abs: 1.2 10*3/uL (ref 0.7–4.0)
MCH: 28.9 pg (ref 26.0–34.0)
MCHC: 34.1 g/dL (ref 30.0–36.0)
MCV: 84.7 fL (ref 78.0–100.0)
MONO ABS: 0.7 10*3/uL (ref 0.1–1.0)
Monocytes Relative: 6 % (ref 3–12)
NEUTROS ABS: 9.7 10*3/uL — AB (ref 1.7–7.7)
Neutrophils Relative %: 84 % — ABNORMAL HIGH (ref 43–77)
Platelets: 68 10*3/uL — ABNORMAL LOW (ref 150–400)
RBC: 3.67 MIL/uL — ABNORMAL LOW (ref 4.22–5.81)
RDW: 15.2 % (ref 11.5–15.5)
WBC: 11.6 10*3/uL — ABNORMAL HIGH (ref 4.0–10.5)

## 2014-01-11 LAB — URIC ACID: Uric Acid, Serum: 1.8 mg/dL — ABNORMAL LOW (ref 4.0–7.8)

## 2014-01-11 LAB — GLUCOSE, CAPILLARY
GLUCOSE-CAPILLARY: 167 mg/dL — AB (ref 70–99)
Glucose-Capillary: 145 mg/dL — ABNORMAL HIGH (ref 70–99)
Glucose-Capillary: 219 mg/dL — ABNORMAL HIGH (ref 70–99)
Glucose-Capillary: 210 mg/dL — ABNORMAL HIGH (ref 70–99)

## 2014-01-11 LAB — LACTATE DEHYDROGENASE: LDH: 313 U/L — AB (ref 94–250)

## 2014-01-11 MED ORDER — VANCOMYCIN HCL IN DEXTROSE 1-5 GM/200ML-% IV SOLN
1000.0000 mg | INTRAVENOUS | Status: AC
  Administered 2014-01-12: 1000 mg via INTRAVENOUS
  Filled 2014-01-11: qty 200

## 2014-01-11 MED ORDER — PREDNISONE 50 MG PO TABS
60.0000 mg | ORAL_TABLET | Freq: Every day | ORAL | Status: AC
  Administered 2014-01-11: 60 mg via ORAL
  Filled 2014-01-11: qty 1

## 2014-01-11 MED ORDER — ONDANSETRON HCL 8 MG PO TABS
8.0000 mg | ORAL_TABLET | Freq: Two times a day (BID) | ORAL | Status: DC
  Administered 2014-01-11 – 2014-01-12 (×3): 8 mg via ORAL
  Filled 2014-01-11 (×4): qty 1

## 2014-01-11 NOTE — Progress Notes (Signed)
Erik Ramirez   DOB:Aug 16, 1925   TM#:[id number]   GXQ#:[id number]  Subjective: Erik Ramirez tolerated yesterday's chemotherapy w/o event-- speifically denies N/V, phlebitis. He has been drinking quite a bit and goig to the BR to urinate frequently-- denies dizzyness or gait imbalance. Has some soreness L upper anterior chest, no other pain. BM yesterday per his account. No family in room   Objective: elderly white man examined in bed Filed Vitals:   01/11/14 0547  BP: 172/75  Pulse: 62  Temp:   Resp: 16    Body mass index is 29.16 kg/(m^2).  Intake/Output Summary (Last 24 hours) at 01/11/14 0819 Last data filed at 01/10/14 2258  Gross per 24 hour  Intake    565 ml  Output   1750 ml  Net  -1185 ml   Lungs no rales or rhonchi, auscultated anterolaterally Heart RRR, no murmur appreciated abd soft, NT, +BS  CBG (last 3)   Recent Labs  01/10/14 1657 01/10/14 2118 01/11/14 0722  GLUCAP 210* 210* 145*     Labs:  Lab Results  Component Value Date   WBC 11.6* 01/11/2014   HGB 10.6* 01/11/2014   HCT 31.1* 01/11/2014   MCV 84.7 01/11/2014   PLT 68* 01/11/2014   NEUTROABS 9.7* 01/11/2014    @LASTCHEMISTRY @  Urine Studies No results found for this basename: UACOL, UAPR, USPG, UPH, UTP, UGL, UKET, UBIL, UHGB, UNIT, UROB, ULEU, UEPI, UWBC, URBC, UBAC, CAST, CRYS, UCOM, BILUA,  in the last 72 hours  Basic Metabolic Panel:  Recent Labs Lab 01/08/14 0517 01/08/14 1703 01/09/14 0435 01/10/14 0432 01/11/14 0347  NA 134* 140 143 140 135*  K 5.7* 4.5 4.8 4.2 4.6  CL 101 104 107 106 100  CO2 21 20 23 22 24   GLUCOSE 258* 207* 217* 187* 179*  BUN 44* 52* 58* 53* 44*  CREATININE 1.62* 1.89* 1.92* 1.59* 1.14  CALCIUM 8.0* 8.0* 7.9* 8.1* 7.7*   GFR Estimated Creatinine Clearance: 49.3 ml/min (by C-G formula based on Cr of 1.14). Liver Function Tests:  Recent Labs Lab 01/07/14 0438 01/08/14 0517 01/09/14 0435 01/10/14 0432 01/11/14 0347  AST 55* 149* 137* 50* 47*  ALT 18  21 19 21  37  ALKPHOS 112 138* 121* 122* 119*  BILITOT 0.4 0.3 0.4 0.5 0.5  PROT 5.6* 5.9* 5.6* 5.9* 5.6*  ALBUMIN 2.0* 2.0* 2.0* 2.1* 2.2*   No results found for this basename: LIPASE, AMYLASE,  in the last 168 hours No results found for this basename: AMMONIA,  in the last 168 hours Coagulation profile  Recent Labs Lab 01/05/14 2142 01/06/14 0645  INR 1.17 1.15    CBC:  Recent Labs Lab 01/07/14 0438 01/08/14 0517 01/09/14 0435 01/10/14 0432 01/11/14 0347  WBC 17.5* 25.5* 17.0* 13.9* 11.6*  NEUTROABS 7.8* 22.4* 14.6* 11.6* 9.7*  HGB 11.6* 11.6* 10.4* 10.8* 10.6*  HCT 35.9* 34.6* 30.8* 32.8* 31.1*  MCV 88.2 85.6 84.6 86.5 84.7  PLT 148* 64* 62* 57* 68*   Cardiac Enzymes:  Recent Labs Lab 01/06/14 1709 01/06/14 2310 01/07/14 0438  TROPONINI <0.30 <0.30 <0.30   BNP: No components found with this basename: POCBNP,  CBG:  Recent Labs Lab 01/10/14 0734 01/10/14 1230 01/10/14 1657 01/10/14 2118 01/11/14 0722  GLUCAP 146* 178* 210* 210* 145*   D-Dimer No results found for this basename: DDIMER,  in the last 72 hours Hgb A1c No results found for this basename: HGBA1C,  in the last 72 hours Lipid Profile No results found for this basename:  CHOL, HDL, LDLCALC, TRIG, CHOLHDL, LDLDIRECT,  in the last 72 hours Thyroid function studies No results found for this basename: TSH, T4TOTAL, FREET3, T3FREE, THYROIDAB,  in the last 72 hours Anemia work up No results found for this basename: VITAMINB12, FOLATE, FERRITIN, TIBC, IRON, RETICCTPCT,  in the last 72 hours Microbiology Recent Results (from the past 240 hour(s))  BODY FLUID CULTURE     Status: None   Collection Time    01/08/14 12:33 PM      Result Value Ref Range Status   Specimen Description PLEURAL RIGHT   Final   Special Requests NONE   Final   Gram Stain     Final   Value: FEW WBC PRESENT,BOTH PMN AND MONONUCLEAR     NO ORGANISMS SEEN     Performed at [facility]   Culture     Final    Value: NO GROWTH 2 DAYS     Performed at [facility] PENDING   Incomplete  Results for Erik, Ramirez (MRN [id number]) as of 01/11/2014 08:21  Ref. Range 01/10/2014 07:34 01/10/2014 12:30 01/10/2014 16:57 01/10/2014 21:18 01/11/2014 07:22  Glucose-Capillary Latest Range: 70-99 mg/dL 146 (H) 178 (H) 210 (H) 210 (H) 145 (H)  Results for Erik, Ramirez (MRN [id number]) as of 01/11/2014 08:21  Ref. Range 01/08/2014 05:17 01/09/2014 04:35 01/10/2014 04:32 01/11/2014 03:47  Uric Acid, Serum Latest Range: 4.0-7.8 mg/dL 7.9 (H) 2.8 (L) 1.9 (L) 1.8 (L)  Results for Erik, Ramirez (MRN [id number]) as of 01/11/2014 08:21  Ref. Range 01/07/2014 04:38 01/08/2014 05:17 01/09/2014 04:35 01/10/2014 04:32 01/11/2014 03:47  LDH Latest Range: 94-250 U/L 354 (H) 1059 (H) 826 (H) 439 (H) 313 (H)  Results for Erik, Ramirez (MRN [id number]) as of 01/11/2014 08:21  Ref. Range 01/07/2014 04:38 01/08/2014 05:17 01/09/2014 04:35 01/10/2014 04:32 01/11/2014 03:47  Platelets Latest Range: 150-400 K/uL 148 (L) 64 (L) 62 (L) 57 (L) 68 (L)   . Studies:  Dg Chest 2 View  01/09/2014   CLINICAL DATA:  Pleural effusion.  EXAM: CHEST  2 VIEW  COMPARISON:  January 08, 2014.  FINDINGS: Stable cardiomediastinal silhouette. No pneumothorax is noted. Minimal right pleural effusion is noted. Otherwise right lung is clear. Stable mild left pleural effusion is noted with possible underlying atelectasis.  IMPRESSION: Stable bilateral pleural effusions with left greater than right.   Electronically Signed   By: Erik Ramirez M.D.   On: 01/09/2014 15:46    Assessment: 80 y.o. [name] with a new diagnosis of diffuse large-cell, B-cell non-Hodgkin's lymphoma (1) rituximab given 01/07/2014, with initial infusion reaction but able to receive entire dose that evening; to be repeated every 21 days (2) prednisone started 01/07/2014, last dose 01/11/2014-- cbg's acceptable (on SSI) (3) dose-reduced CHOP 01/10/2014--to be repeated every  21 days  OTHER PROBLEMS: (a) thrombocytopenia: would hold anticoagulation for next 48 hours or so if OK w cardiology; his platelet count is likely to fall furthe with today's chemo; there is no contraindication to platelet transfusion if bleeding becomes an issue  (b) tumor lysis syndrome: very favorable initial reaction to treatment; need to continue to monitor LDH, uric acid, electrolytes daily  (c) AKI: creatinin is improving with gentle hydration  (d) A-fib-- has converted to SR-- further management per cardiology  Plan:  Patient is day 2 cycle 1 CHOP chemotherapy; completes prednisone today. I have d/c'd his levaquin, as all Cxs are negative and he is AF and non-neutropenic. Have written for PT eval. Have  written for port placement if it can be done with 60K platelets. Have written for CXR to assess effusions. Otherwise if patient is sufficiently amblatory and capable of ADL he could be d/c's tomorrow at hospitalist's discretion. I will arrange for follow up at the cancer center next week to check counts and set him up for his next cycle of chemotherapy.  Appreciate Drs Tyrell Antonio and Zamora's help!  Chauncey Cruel, MD 01/11/2014  8:19 AM

## 2014-01-11 NOTE — Consult Note (Signed)
Reason for consult: port a cath placement  Referring Physician(s): Dr. Darnell Level Magrinat  History of Present Illness: Erik Ramirez is a 80 y.o. male with recently diagnosed NHL (diffuse large B cell lymphoma). Request has been made by oncology for port a cath placement for chemotherapy. PMH also sig for recent Afib, HTN, DM, OSA (on CPAP), CKD stage III, bilat pleural effusions.  Past Medical History  Diagnosis Date  . Hypertension   . Hyperlipemia   . Anxiety   . Lymphadenopathy, abdominal   . Urethral stricture   . Anemia   . BPPV (benign paroxysmal positional vertigo)   . Lymphoma   . Diabetes mellitus without complication   . Obstructive sleep apnea     Past Surgical History  Procedure Laterality Date  . Back surgery    . Prostate surgery    . Tonsillectomy      Allergies: Rituximab; Caffeine; Morphine and related; and Penicillins  Medications: Prior to Admission medications   Medication Sig Start Date End Date Taking? Authorizing Provider  amLODipine (NORVASC) 10 MG tablet Take 5 mg by mouth daily.     Yes Historical Provider, MD  aspirin 81 MG tablet Take 81 mg by mouth daily.     Yes Historical Provider, MD  clorazepate (TRANXENE) 7.5 MG tablet Take 7.5 mg by mouth 2 (two) times daily as needed for anxiety or sleep.   Yes Historical Provider, MD  clotrimazole-betamethasone (LOTRISONE) cream Apply 1 application topically 2 (two) times daily.   Yes Historical Provider, MD  lisinopril (PRINIVIL,ZESTRIL) 20 MG tablet Take 20 mg by mouth daily.     Yes Historical Provider, MD  metoprolol (LOPRESSOR) 50 MG tablet Take 25 mg by mouth 2 (two) times daily.     Yes Historical Provider, MD  simvastatin (ZOCOR) 20 MG tablet Take 20 mg by mouth daily.   Yes Historical Provider, MD  traMADol (ULTRAM) 50 MG tablet Take 50 mg by mouth every 6 (six) hours as needed for moderate pain.   Yes Historical Provider, MD    History reviewed. No pertinent family history.  History    Social History  . Marital Status: Married    Spouse Name: N/A    Number of Children: N/A  . Years of Education: N/A   Social History Main Topics  . Smoking status: Never Smoker   . Smokeless tobacco: None  . Alcohol Use: No  . Drug Use: No  . Sexual Activity: No   Other Topics Concern  . None   Social History Narrative  . None       Review of Systems    Constitutional: Positive for fatigue. Negative for fever and chills.  Respiratory:       Occ cough, dyspnea  Cardiovascular:       Occ left lateral chest discomfort   Gastrointestinal: Negative for nausea, vomiting, abdominal pain and blood in stool.  Genitourinary: Negative for hematuria.  Musculoskeletal: Negative for back pain.  Neurological: Negative for headaches.  Hematological: Bruises/bleeds easily.    Vital Signs: BP 172/75  Pulse 62  Temp(Src) 97.8 F (36.6 C) (Oral)  Resp 16  Ht 5\' 7"  (1.702 m)  Wt 186 lb 3.2 oz (84.46 kg)  BMI 29.16 kg/m2  SpO2 94%  Physical Exam  Constitutional: He is oriented to person, place, and time. He appears well-developed and well-nourished.  Cardiovascular: Normal rate and regular rhythm.   Pulmonary/Chest: Effort normal.  Dim BS bases, left greater than right  Abdominal: Soft. Bowel  sounds are normal. There is no tenderness.  Neurological: He is alert and oriented to person, place, and time.    Imaging: Dg Chest 1 View  01/08/2014   CLINICAL DATA:  Post thoracentesis.  EXAM: CHEST - 1 VIEW  COMPARISON:  01/08/2014  FINDINGS: Improved aeration at the right lung base consistent with a recent thoracentesis. There is decreased right pleural fluid. Persistent densities at the left lung base suggest pleural fluid and atelectasis. Heart size is grossly stable. The trachea is midline.  IMPRESSION: Negative for a pneumothorax following the right thoracentesis. Decreased right pleural effusion.  Left basilar densities are compatible with pleural fluid.   Electronically Signed    By: Markus Daft M.D.   On: 01/08/2014 13:48   Dg Chest 2 View  01/11/2014   CLINICAL DATA:  Shortness of breath.  Follow-up effusions.  EXAM: CHEST  2 VIEW  COMPARISON:  01/09/2014  FINDINGS: Heart is upper limits normal in size. Bilateral pleural effusions noted, left greater than right, similar to prior study. Bibasilar atelectasis. No acute bony abnormality.  IMPRESSION: Stable bilateral effusions, left slightly greater than right. Bibasilar atelectasis.   Electronically Signed   By: Rolm Baptise M.D.   On: 01/11/2014 12:04   Dg Chest 2 View  01/09/2014   CLINICAL DATA:  Pleural effusion.  EXAM: CHEST  2 VIEW  COMPARISON:  January 08, 2014.  FINDINGS: Stable cardiomediastinal silhouette. No pneumothorax is noted. Minimal right pleural effusion is noted. Otherwise right lung is clear. Stable mild left pleural effusion is noted with possible underlying atelectasis.  IMPRESSION: Stable bilateral pleural effusions with left greater than right.   Electronically Signed   By: Sabino Dick M.D.   On: 01/09/2014 15:46   Dg Chest 2 View  01/08/2014   CLINICAL DATA:  Followup effusion. Cough, shortness of breath and weakness.  EXAM: CHEST  2 VIEW  COMPARISON:  01/05/2014 as well as chest CT 01/05/2014  FINDINGS: Lungs are hypoinflated with persistent hazy right basilar opacification compatible with known small effusion and atelectasis. Slight worsening opacification the left base compatible known moderate effusions/atelectasis. Cannot completely exclude infection in the lung bases. Prominence of the right hilar region compatible with known adenopathy in this patient with a history of lymphoma. Cardiomediastinal silhouette and remainder the exam is unchanged.  IMPRESSION: Stable right base opacification compatible with known small effusion and associated atelectasis. Slight worsening left base opacification compatible with moderate left effusion and atelectasis. Cannot exclude infection in the lung bases.   Prominent right hilum compatible is known adenopathy in this patient with history of lymphoma.   Electronically Signed   By: Marin Olp M.D.   On: 01/08/2014 10:18   Dg Chest 2 View  01/05/2014   CLINICAL DATA:  Shortness of breath.  EXAM: CHEST  2 VIEW  COMPARISON:  None available.  FINDINGS: The cardiac silhouette, mediastinal and hilar contours are somewhat prominent. The upper mediastinum is slightly widened and the right pulmonary hila is prominent. There are small bilateral pleural effusions and overlying atelectasis. No edema or pneumothorax. The bony thorax is grossly intact.  IMPRESSION: Prominent superior mediastinal contours and prominent right hilum. Chest CT may be helpful for further evaluation.  Bilateral pleural effusions and bibasilar atelectasis.   Electronically Signed   By: Kalman Jewels M.D.   On: 01/05/2014 22:32   Ct Chest Wo Contrast  01/05/2014   CLINICAL DATA:  Chest pain.  Recent diagnosis of lymphoma.  EXAM: CT CHEST WITHOUT CONTRAST  TECHNIQUE:  Multidetector CT imaging of the chest was performed following the standard protocol without IV contrast.  COMPARISON:  None.  FINDINGS: THORACIC INLET/BODY WALL:  Adenopathy at the thoracic inlet, with a node posterior to the left IJ measuring 15 mm short axis. Bilateral axillary lymphadenopathy, with a solid node on the left measuring 22 mm in short axis on image 18.  MEDIASTINUM:  No cardiomegaly or pericardial effusion. Diffuse mediastinal lymphadenopathy, present in all stations. Index node in the sub- carina region measures 28 mm short axis, larger than on abdominal imaging. Index left anterior juxtadiaphragmatic node is also larger at 14 mm (previously 10 mm). Bilateral hilar lymphadenopathy. Negative esophagus. Diffuse atherosclerosis, including the coronary arteries. No evidence of acute vascular finding.  LUNG WINDOWS:  Small, layering water density pleural effusions bilaterally. There is atelectasis in both lower lobes.  7 mm  nodule in the right middle lobe. This will be re-assesed at surveillance imaging.  Spiculated density at the right apex favors pleural/parenchymal scarring.  No evidence of edema, pneumothorax, or pneumonia.  UPPER ABDOMEN:  Diffuse adenopathy in the upper abdominal ligaments. No definitive progression since recent abdominal CT. The spleen is partially imaged but clearly enlarged.  OSSEOUS:  No acute fracture.  No suspicious lytic or blastic lesions.  IMPRESSION: 1. Diffuse lymphoma, with intrathoracic progression since 12/13/2013. 2. Small bilateral pleural effusions with lower lobe atelectasis.   Electronically Signed   By: [name] M.D.   On: 01/05/2014 23:03   Nm Pulmonary Perf And Vent  01/06/2014   CLINICAL DATA:  Shortness of breath question pulmonary embolism, history hypertension, diabetes, lymphoma  EXAM: NUCLEAR MEDICINE VENTILATION - PERFUSION LUNG SCAN  TECHNIQUE: Ventilation images were obtained in multiple projections using inhaled aerosol technetium 99 M DTPA. Perfusion images were obtained in multiple projections after intravenous injection of Tc-93m MAA.  RADIOPHARMACEUTICALS:  41.0 mCi Tc-24m DTPA aerosol inhalation and 5.0 mCi Tc-17m MAA IV  COMPARISON:  None; correlation chest radiograph and CT chest exams of 01/05/2014  FINDINGS: Ventilation: Mild central airway deposition of tracer. Minimal peripheral regularity of peripheral ventilation in both lungs.  Perfusion: Irregular areas of nonsegmental diminished perfusion at the lateral and posterolateral RIGHT lung and at the posterior LEFT lung, corresponding to pleural effusions identified on chest CT. Small subsegmental perfusion defect RIGHT lower lobe. No additional segmental or subsegmental perfusion defects identified.  Chest CT demonstrates small to moderate-sized pleural effusions bilaterally larger on RIGHT with compressive atelectasis of BILATERAL lower lobes also greater on RIGHT.  IMPRESSION: BILATERAL pleural effusions  causing compression of the lungs bilaterally.  Single subsegmental perfusion defect RIGHT lower lobe.  Findings represent a low probability for pulmonary embolism.   Electronically Signed   By: Lavonia Dana M.D.   On: 01/06/2014 12:19   US Thoracentesis Asp Pleural Space W/img Guide  01/08/2014   CLINICAL DATA:  Shortness of breath, bilateral pleural effusions. History of lymphoma. Request diagnostic and therapeutic thoracentesis.  EXAM: ULTRASOUND GUIDED RIGHT THORACENTESIS  COMPARISON:  None  PROCEDURE: An ultrasound guided thoracentesis was thoroughly discussed with the patient and questions answered. The benefits, risks, alternatives and complications were also discussed. The patient understands and wishes to proceed with the procedure. Written consent was obtained.  Ultrasound of the chest finds small to moderate size left pleural effusion and a moderate to large right pleural effusion. Right side is chosen for thoracentesis.  Ultrasound was performed to localize and mark an adequate pocket of fluid in the right chest. The area was then prepped  and draped in the normal sterile fashion. 1% Lidocaine was used for local anesthesia. Under ultrasound guidance a 19 gauge Yueh catheter was introduced. Thoracentesis was performed. The catheter was removed and a dressing applied.  Complications:  None immediate  FINDINGS: A total of approximately 1 L of bloody pleural fluid was removed. A fluid sample wassent for laboratory analysis.  IMPRESSION: Successful ultrasound guided right thoracentesis yielding 1 L of pleural fluid.  Read by: Ascencion Dike PA-C   Electronically Signed   By: Daryll Brod M.D.   On: 01/08/2014 12:59    Labs: Lab Results  Component Value Date   WBC 11.6* 01/11/2014   HCT 31.1* 01/11/2014   MCV 84.7 01/11/2014   PLT 68* 01/11/2014   NA 135* 01/11/2014   K 4.6 01/11/2014   CL 100 01/11/2014   CO2 24 01/11/2014   GLUCOSE 179* 01/11/2014   BUN 44* 01/11/2014   CREATININE 1.14 01/11/2014    CALCIUM 7.7* 01/11/2014   PROT 5.6* 01/11/2014   ALBUMIN 2.2* 01/11/2014   AST 47* 01/11/2014   ALT 37 01/11/2014   ALKPHOS 119* 01/11/2014   BILITOT 0.5 01/11/2014   GFRNONAA 57* 01/11/2014   GFRAA 66* 01/11/2014   INR 1.15 01/06/2014    Assessment and Plan: Garvis Mccomas is a 80 y.o. male with recently diagnosed NHL (diffuse large B cell lymphoma). Request has been made by oncology for port a cath placement for chemotherapy. Pt ate lunch today and will receive IV conscious sedation therefore will schedule case for 9/30.   Details/risks of procedure d/w pt/family with their understanding and consent. Pt with thrombocytopenia, currently 68k. Will recheck in am.          I spent a total of 20 minutes face to face in clinical consultation, greater than 50% of which was counseling/coordinating care for port a cath placement.   Signed: Autumn Messing 01/11/2014, 2:24 PM

## 2014-01-11 NOTE — Care Management Note (Signed)
CARE MANAGEMENT NOTE 01/11/2014  Patient:  Erik Ramirez,Erik Ramirez   Account Number:  [id number]  Date Initiated:  01/11/2014  Documentation initiated by:  Marney Doctor  Subjective/Objective Assessment:   80 yo admitted with dyspnea.  recently diagnosed large B cell lymphoma who was admitted on 01/05/14     Action/Plan:   From home with wife   Anticipated DC Date:  01/12/2014   Anticipated DC Plan:  [place]  CM consult      Choice offered to / List presented to:  C-1 Patient        [place] arranged  [facility]      [place].   Status of service:  In process, will continue to follow Medicare Important Message given?   (If response is "NO", the following Medicare IM given date fields will be blank) Date Medicare IM given:   Medicare IM given by:   Date Additional Medicare IM given:   Additional Medicare IM given by:    Discharge Disposition:    Per UR Regulation:  Reviewed for med. necessity/level of care/duration of stay  If discussed at [place] of Stay Meetings, dates discussed:   01/11/2014    Comments:  01/11/14 Marney Doctor RN,BSN,NCM PT recommended HHPT and RW.  Pt given choice and he chose [facility].  He also states that he doesn't need a walker because his wife has one he can use.  [facility] contacted and given referral.  Unsure at this time if pt will need home O2.  CM will continue to follow.

## 2014-01-11 NOTE — Progress Notes (Signed)
Pt placed on CPAP QHS per orders.  Pt brought in his home equipment.  Pts home equipment appeared to be in good working order with no frays or cuts in the cord.  The Pt has been on O2 while in the hospital and an adapter was added to the Pt's set-up to allo for O2 bleed in to maintain O2 saturation.  Pt's machine was plugged into a red outlet and did not spark when it was plugged in.  Service response notified.

## 2014-01-11 NOTE — Progress Notes (Signed)
Progress Note   Erik Ramirez XVQ:[id number] DOB: [birth year] DOA: 01/05/2014 PCP: [name], MD   Brief Narrative:   Erik Ramirez is an 80 y.o. male with PMH of hypertension, dyslipidemia, diabetes, OSA on nocturnal CPAP and recently diagnosed large B cell lymphoma who was admitted on 01/05/14 with chief complaint of a two-day history of progressively worsening dyspnea associated with cough and fever. Upon initial evaluation in the ED, the patient was found to have bilateral pleural effusions with worsening lymphadenopathy on CT scan without contrast.  On 01/06/14, the patient went into atrial fibrillation with RVR and a Cardizem drip was initiated. Oncology consulted.   Patient was transition from IV Cardizem to oral. He converted to sinus rhythm. Oncology consulted, and patient was started on Rituximab.He developed reaction to rituximab infusion. He received IV solumedrol, Pepcid, epinephrine. Reaction resolved after that. He was able to complete rituximab infusion at low rate. Due to increase uric acid  he received Rasburicase.   On 9-26 patient  developed worsening respiratory failure. Requiring  5 L of oxygen. . A chest x ray showed worsening left opacification and  pleural effusion. He underwent left side thoracentesis, which improved hypoxic respiratory failure.   Assessment/Plan:      Acute respiratory failure with hypoxia  Broad differential with progressive lymphoma/lymphadenopathy, community-acquired pneumonia, pulmonary embolism: The differential.  On empiric antibiotics to address possible community-acquired pneumonia. Strep pneumonia negative/Legionella negative.    VQ scan and lower extremity Dopplers negative, so pulmonary embolism ruled out.   2-D echo: Ef 55 %, diastolic dysfunction grade 1.   Continue supportive care with oxygen as needed to maintain saturations.  Repeat chest x ray 9-26 showed worsening left opacification and  pleural effusion. Requiring more  oxygen, 5 L.  Patient S/P  thoracentesis 9-26 yielding 1 L of bloody pleural fluid. Pleural fluid culture no growth to date.   Repeat chest x-ray on 01/09/2014 showing stable bilateral pleural effusions, left greater than right    Atrial fibrillation with rapid ventricular response  Converted to sinus rhythm. Rate controlled. Transition from Cardizem Gtt to oral Cardizem.    Will monitor on telemetry.   Continue aspirin therapy for now, not candidate for anticoagulation given thrombocytopenia    Stage III chronic kidney disease  Current GFR 25-26.  Cr trend; 2 ---1.8---1.6 --1.9  Patient tolerating PO intake    Hypertension  Continue  Lopressor. Lisinopril on hold secondary to impaired renal function.  DC Norvasc, patient now on Cardizem.     Diabetes mellitus with renal complications  Diet controlled, borderline per patient.  hemoglobin A1c 6.4    Obstructive sleep apnea  Continue nocturnal CPAP.    DLBCL (diffuse large B cell lymphoma)  Dr Jana Hakim following. Received prednisone and rituximab 9-25.   He developed reaction to rituximab. He received IV solumedrol, Pepcid, epinephrine. Reaction resolved after that.   He finished slow infusion of rituximab 9-26.  He received Rasburicase due to increase uric acid.   Plan for chemotherapy on 01/02/2014     Dyslipidemia  Will hold statin due to increase LFT.       DVT Prophylaxis  Addendum: hold  Heparin due to thrombocytopenia, start SCD. Resume heparin when ok by oncologist.   Hyperkalemia: resolved after Kayexalate.    Code Status: Full. Family Communication: Wife and son at bedside. Disposition Plan: Home when stable.   IV Access:    Peripheral IV   Procedures and diagnostic studies:   Dg Chest 2 View 01/05/2014: Prominent  superior mediastinal contours and prominent right hilum. Chest CT may be helpful for further evaluation.  Bilateral pleural effusions and bibasilar atelectasis.     Ct Chest  Wo Contrast 01/05/2014: 1. Diffuse lymphoma, with intrathoracic progression since 12/13/2013. 2. Small bilateral pleural effusions with lower lobe atelectasis.   Electronically Signed   By: [name] M.D.   On: 01/05/2014 23:03   Nm Pulmonary Perf And Vent 01/06/2014: BILATERAL pleural effusions causing compression of the lungs bilaterally.  Single subsegmental perfusion defect RIGHT lower lobe.  Findings represent a low probability for pulmonary embolism.      Medical Consultants:    Dr. [name], Oncology.   Other Consultants:    None.   Anti-Infectives:    Levaquin 01/05/14--->01/11/2014  Subjective:   Patient undergoing chemotherapy on 01/10/2014, tolerating it well.   Objective:    Filed Vitals:   01/10/14 1100 01/10/14 1520 01/10/14 2046 01/11/14 0547  BP: 164/75  172/90 172/75  Pulse: 65 60 66 62  Temp: 97.6 F (36.4 C)  97.8 F (36.6 C)   TempSrc: Oral  Oral   Resp: 16  16 16   Height: 5\' 7"  (1.702 m)     Weight: 84.006 kg (185 lb 3.2 oz)   84.46 kg (186 lb 3.2 oz)  SpO2: 94% 95% 96% 94%    Intake/Output Summary (Last 24 hours) at 01/11/14 1200 Last data filed at 01/11/14 0842  Gross per 24 hour  Intake    565 ml  Output   2050 ml  Net  -1485 ml    Exam: Gen:  NAD, weak Cardiovascular:  RRR, No M/R/G Respiratory:   Diminished bilaterally. No wheezing. Bilateral fine crackles/  Gastrointestinal:  Abdomen soft, NT/ND, + BS Extremities:  Trace edema   Data Reviewed:    Labs: Basic Metabolic Panel:  Recent Labs Lab 01/08/14 0517 01/08/14 1703 01/09/14 0435 01/10/14 0432 01/11/14 0347  NA 134* 140 143 140 135*  K 5.7* 4.5 4.8 4.2 4.6  CL 101 104 107 106 100  CO2 21 20 23 22 24   GLUCOSE 258* 207* 217* 187* 179*  BUN 44* 52* 58* 53* 44*  CREATININE 1.62* 1.89* 1.92* 1.59* 1.14  CALCIUM 8.0* 8.0* 7.9* 8.1* 7.7*   GFR Estimated Creatinine Clearance: 49.3 ml/min (by C-G formula based on Cr of 1.14). Liver Function Tests:  Recent  Labs Lab 01/07/14 0438 01/08/14 0517 01/09/14 0435 01/10/14 0432 01/11/14 0347  AST 55* 149* 137* 50* 47*  ALT 18 21 19 21  37  ALKPHOS 112 138* 121* 122* 119*  BILITOT 0.4 0.3 0.4 0.5 0.5  PROT 5.6* 5.9* 5.6* 5.9* 5.6*  ALBUMIN 2.0* 2.0* 2.0* 2.1* 2.2*   Coagulation profile  Recent Labs Lab 01/05/14 2142 01/06/14 0645  INR 1.17 1.15    CBC:  Recent Labs Lab 01/07/14 0438 01/08/14 0517 01/09/14 0435 01/10/14 0432 01/11/14 0347  WBC 17.5* 25.5* 17.0* 13.9* 11.6*  NEUTROABS 7.8* 22.4* 14.6* 11.6* 9.7*  HGB 11.6* 11.6* 10.4* 10.8* 10.6*  HCT 35.9* 34.6* 30.8* 32.8* 31.1*  MCV 88.2 85.6 84.6 86.5 84.7  PLT 148* 64* 62* 57* 68*   BNP (last 3 results)  Recent Labs  01/05/14 2142  PROBNP 343.7   Microbiology Recent Results (from the past 240 hour(s))  BODY FLUID CULTURE     Status: None   Collection Time    01/08/14 12:33 PM      Result Value Ref Range Status   Specimen Description PLEURAL RIGHT   Final  Special Requests NONE   Final   Gram Stain     Final   Value: FEW WBC PRESENT,BOTH PMN AND MONONUCLEAR     NO ORGANISMS SEEN     Performed at [facility]   Culture     Final   Value: NO GROWTH 3 DAYS     Performed at [facility]   Report Status 01/11/2014 FINAL   Final     Medications:   . allopurinol  300 mg Oral Daily  . antiseptic oral rinse  7 mL Mouth Rinse q12n4p  . aspirin  325 mg Oral Daily  . chlorhexidine  15 mL Mouth Rinse BID  . diltiazem  120 mg Oral Daily  . feeding supplement (ENSURE COMPLETE)  237 mL Oral Q24H  . insulin aspart  0-20 Units Subcutaneous TID WC  . metoprolol  25 mg Oral BID  . ondansetron  8 mg Oral Q12H  . sodium chloride  3 mL Intravenous Q12H   Continuous Infusions: . sodium chloride 40 mL/hr at 01/09/14 1832    Time spent: 25 min    LOS: 6 days   Kelvin Cellar  [facility] Pager [phone]  *Please refer to [email or web address], password [email or web address] to get updated schedule on who will round  on this patient, as hospitalists switch teams weekly. If 7PM-7AM, please contact night-coverage at [email or web address], password [email or web address] for any overnight needs.  01/11/2014, 12:00 PM

## 2014-01-12 ENCOUNTER — Telehealth: Payer: Self-pay | Admitting: Oncology

## 2014-01-12 ENCOUNTER — Telehealth: Payer: Self-pay | Admitting: *Deleted

## 2014-01-12 ENCOUNTER — Other Ambulatory Visit: Payer: Self-pay | Admitting: Oncology

## 2014-01-12 ENCOUNTER — Inpatient Hospital Stay (HOSPITAL_COMMUNITY): Payer: Medicare Other

## 2014-01-12 DIAGNOSIS — C8590 Non-Hodgkin lymphoma, unspecified, unspecified site: Secondary | ICD-10-CM

## 2014-01-12 DIAGNOSIS — D6959 Other secondary thrombocytopenia: Secondary | ICD-10-CM

## 2014-01-12 DIAGNOSIS — E119 Type 2 diabetes mellitus without complications: Secondary | ICD-10-CM

## 2014-01-12 LAB — GLUCOSE, CAPILLARY
Glucose-Capillary: 157 mg/dL — ABNORMAL HIGH (ref 70–99)
Glucose-Capillary: 268 mg/dL — ABNORMAL HIGH (ref 70–99)

## 2014-01-12 LAB — CBC WITH DIFFERENTIAL/PLATELET
Basophils Absolute: 0 10*3/uL (ref 0.0–0.1)
Basophils Relative: 0 % (ref 0–1)
Eosinophils Absolute: 0 10*3/uL (ref 0.0–0.7)
Eosinophils Relative: 0 % (ref 0–5)
HEMATOCRIT: 31.4 % — AB (ref 39.0–52.0)
HEMOGLOBIN: 10.6 g/dL — AB (ref 13.0–17.0)
LYMPHS PCT: 7 % — AB (ref 12–46)
Lymphs Abs: 0.7 10*3/uL (ref 0.7–4.0)
MCH: 28.7 pg (ref 26.0–34.0)
MCHC: 33.8 g/dL (ref 30.0–36.0)
MCV: 85.1 fL (ref 78.0–100.0)
MONO ABS: 1.1 10*3/uL — AB (ref 0.1–1.0)
MONOS PCT: 10 % (ref 3–12)
NEUTROS ABS: 8.5 10*3/uL — AB (ref 1.7–7.7)
NEUTROS PCT: 83 % — AB (ref 43–77)
Platelets: 79 10*3/uL — ABNORMAL LOW (ref 150–400)
RBC: 3.69 MIL/uL — ABNORMAL LOW (ref 4.22–5.81)
RDW: 15.2 % (ref 11.5–15.5)
WBC: 10.3 10*3/uL (ref 4.0–10.5)

## 2014-01-12 LAB — COMPREHENSIVE METABOLIC PANEL
ALK PHOS: 100 U/L (ref 39–117)
ALT: 31 U/L (ref 0–53)
AST: 29 U/L (ref 0–37)
Albumin: 2.1 g/dL — ABNORMAL LOW (ref 3.5–5.2)
Anion gap: 10 (ref 5–15)
BILIRUBIN TOTAL: 0.6 mg/dL (ref 0.3–1.2)
BUN: 46 mg/dL — ABNORMAL HIGH (ref 6–23)
CO2: 24 meq/L (ref 19–32)
CREATININE: 1.24 mg/dL (ref 0.50–1.35)
Calcium: 7.9 mg/dL — ABNORMAL LOW (ref 8.4–10.5)
Chloride: 104 mEq/L (ref 96–112)
GFR calc Af Amer: 59 mL/min — ABNORMAL LOW (ref >90)
GFR, EST NON AFRICAN AMERICAN: 51 mL/min — AB (ref >90)
Glucose, Bld: 183 mg/dL — ABNORMAL HIGH (ref 70–99)
POTASSIUM: 4.8 meq/L (ref 3.7–5.3)
Sodium: 138 mEq/L (ref 137–147)
Total Protein: 5.3 g/dL — ABNORMAL LOW (ref 6.0–8.3)

## 2014-01-12 LAB — URIC ACID: URIC ACID, SERUM: 2.5 mg/dL — AB (ref 4.0–7.8)

## 2014-01-12 LAB — LACTATE DEHYDROGENASE: LDH: 269 U/L — ABNORMAL HIGH (ref 94–250)

## 2014-01-12 MED ORDER — ASPIRIN 325 MG PO TABS: 325.0000 mg | ORAL_TABLET | Freq: Every day | ORAL | Status: DC

## 2014-01-12 MED ORDER — DILTIAZEM HCL ER COATED BEADS 120 MG PO CP24: 120.0000 mg | ORAL_CAPSULE | Freq: Every day | ORAL | Status: DC

## 2014-01-12 MED ORDER — MIDAZOLAM HCL 2 MG/2ML IJ SOLN
INTRAMUSCULAR | Status: AC
  Filled 2014-01-12: qty 2

## 2014-01-12 MED ORDER — HEPARIN SOD (PORK) LOCK FLUSH 100 UNIT/ML IV SOLN
INTRAVENOUS | Status: AC
  Filled 2014-01-12: qty 5

## 2014-01-12 MED ORDER — HEPARIN SOD (PORK) LOCK FLUSH 100 UNIT/ML IV SOLN: 500.0000 [IU] | INTRAVENOUS | Status: DC | PRN

## 2014-01-12 MED ORDER — ENSURE COMPLETE PO LIQD: 237.0000 mL | ORAL | Status: DC

## 2014-01-12 MED ORDER — ALLOPURINOL 300 MG PO TABS: 300.0000 mg | ORAL_TABLET | Freq: Every day | ORAL | Status: DC

## 2014-01-12 MED ORDER — MIDAZOLAM HCL 2 MG/2ML IJ SOLN
INTRAMUSCULAR | Status: AC | PRN
Start: 2014-01-12 — End: 2014-01-12
  Administered 2014-01-12: 1 mg via INTRAVENOUS

## 2014-01-12 MED ORDER — LIDOCAINE-EPINEPHRINE (PF) 2 %-1:200000 IJ SOLN
INTRAMUSCULAR | Status: AC
  Filled 2014-01-12: qty 20

## 2014-01-12 MED ORDER — HEPARIN SOD (PORK) LOCK FLUSH 100 UNIT/ML IV SOLN
500.0000 [IU] | Freq: Once | INTRAVENOUS | Status: DC
  Filled 2014-01-12: qty 5

## 2014-01-12 MED ORDER — FENTANYL CITRATE 0.05 MG/ML IJ SOLN
INTRAMUSCULAR | Status: AC | PRN
  Administered 2014-01-12: 50 ug via INTRAVENOUS

## 2014-01-12 MED ORDER — FENTANYL CITRATE 0.05 MG/ML IJ SOLN
INTRAMUSCULAR | Status: AC
  Filled 2014-01-12: qty 2

## 2014-01-12 MED ORDER — ASPIRIN 325 MG PO TABS
325.0000 mg | ORAL_TABLET | Freq: Every day | ORAL | Status: DC
  Administered 2014-01-12: 325 mg via ORAL
  Filled 2014-01-12: qty 1

## 2014-01-12 MED ORDER — ONDANSETRON HCL 8 MG PO TABS: 8.0000 mg | ORAL_TABLET | Freq: Two times a day (BID) | ORAL | Status: DC

## 2014-01-12 MED ORDER — INSULIN ASPART 100 UNIT/ML ~~LOC~~ SOLN: 0.0000 [IU] | Freq: Three times a day (TID) | SUBCUTANEOUS | Status: DC

## 2014-01-12 NOTE — Progress Notes (Signed)
Nursing Discharge Summary  Patient ID: Erik Ramirez MRN: [id number] DOB/AGE: 78-Mar-1927 78 y.o.  Admit date: 01/05/2014 Discharge date: 01/12/2014  Discharged Condition: good  Disposition: 01-Home or Self Care  Follow-up Information   Follow up with CABEZA,YURI, MD In 1 week.   Specialty:  Internal Medicine   Contact information:   [address] [place] [address] [phone]       Follow up with Chauncey Cruel, MD In 1 week.   Specialty:  Oncology   Contact information:   [place] [address] [phone]       Prescriptions Given: Prescriptions given for medications. Follow up appointments discussed and medications discussed. Patient and son both verbalized understanding. Patient's son stated that the patient's wife and daughter both have diabetes and were familiar with sliding scale and giving insulin so didn't need education.   Means of Discharge: patient to be transported downstairs via wheelchair to be discharged home   Signed: Buel Ream 01/12/2014, 2:03 PM

## 2014-01-12 NOTE — Progress Notes (Signed)
Erik Ramirez   DOB:02-25-1926   XO#:[id number]   AYO#:[id number]  Subjective: Erik Ramirez did well with port placement this AM. He has had no nausea/ vomiting, fever, bleeding, or phlebitis. He feels he "is making progress." No family in room   Objective: elderly white man examined in bed Filed Vitals:   01/12/14 0919  BP: 132/65  Pulse: 55  Temp:   Resp: 13    Body mass index is 29.47 kg/(m^2).  Intake/Output Summary (Last 24 hours) at 01/12/14 1229 Last data filed at 01/11/14 1839  Gross per 24 hour  Intake    480 ml  Output    500 ml  Net    -20 ml   Lungs no rales or rhonchi, auscultated anterolaterally Heart RRR, no murmur appreciated abd soft, NT, +BS  CBG (last 3)   Recent Labs  01/11/14 2144 01/12/14 0748 01/12/14 1209  GLUCAP 210* 157* 268*     Labs:  Lab Results  Component Value Date   WBC 10.3 01/12/2014   HGB 10.6* 01/12/2014   HCT 31.4* 01/12/2014   MCV 85.1 01/12/2014   PLT 79* 01/12/2014   NEUTROABS 8.5* 01/12/2014    _0 @  Urine Studies No results found for this basename: UACOL, UAPR, USPG, UPH, UTP, UGL, UKET, UBIL, UHGB, UNIT, UROB, ULEU, UEPI, UWBC, URBC, UBAC, CAST, CRYS, UCOM, BILUA,  in the last 72 hours  Basic Metabolic Panel:  Recent Labs Lab 01/08/14 1703 01/09/14 0435 01/10/14 0432 01/11/14 0347 01/12/14 0418  NA 140 143 140 135* 138  K 4.5 4.8 4.2 4.6 4.8  CL 104 107 106 100 104  CO2 _1 GLUCOSE 207* 217* 187* 179* 183*  BUN 52* 58* 53* 44* 46*  CREATININE 1.89* 1.92* 1.59* 1.14 1.24  CALCIUM 8.0* 7.9* 8.1* 7.7* 7.9*   GFR Estimated Creatinine Clearance: 45.5 ml/min (by C-G formula based on Cr of 1.24). Liver Function Tests:  Recent Labs Lab 01/08/14 0517 01/09/14 0435 01/10/14 0432 01/11/14 0347 01/12/14 0418  AST 149* 137* 50* 47* 29  ALT _2 37 31  ALKPHOS 138* 121* 122* 119* 100  BILITOT 0.3 0.4 0.5 0.5 0.6  PROT 5.9* 5.6* 5.9* 5.6* 5.3*  ALBUMIN 2.0* 2.0* 2.1* 2.2* 2.1*   No  results found for this basename: LIPASE, AMYLASE,  in the last 168 hours No results found for this basename: AMMONIA,  in the last 168 hours Coagulation profile  Recent Labs Lab 01/05/14 2142 01/06/14 0645  INR 1.17 1.15    CBC:  Recent Labs Lab 01/08/14 0517 01/09/14 0435 01/10/14 0432 01/11/14 0347 01/12/14 0418  WBC 25.5* 17.0* 13.9* 11.6* 10.3  NEUTROABS 22.4* 14.6* 11.6* 9.7* 8.5*  HGB 11.6* 10.4* 10.8* 10.6* 10.6*  HCT 34.6* 30.8* 32.8* 31.1* 31.4*  MCV 85.6 84.6 86.5 84.7 85.1  PLT 64* 62* 57* 68* 79*   Cardiac Enzymes:  Recent Labs Lab 01/06/14 1709 01/06/14 2310 01/07/14 0438  TROPONINI <0.30 <0.30 <0.30   BNP: No components found with this basename: POCBNP,  CBG:  Recent Labs Lab 01/11/14 1207 01/11/14 1806 01/11/14 2144 01/12/14 0748 01/12/14 1209  GLUCAP 219* 167* 210* 157* 268*   D-Dimer No results found for this basename: DDIMER,  in the last 72 hours Hgb A1c No results found for this basename: HGBA1C,  in the last 72 hours Lipid Profile No results found for this basename: CHOL, HDL, LDLCALC, TRIG, CHOLHDL, LDLDIRECT,  in the last 72 hours Thyroid function studies No results found  for this basename: TSH, T4TOTAL, FREET3, T3FREE, THYROIDAB,  in the last 72 hours Anemia work up No results found for this basename: VITAMINB12, FOLATE, FERRITIN, TIBC, IRON, RETICCTPCT,  in the last 72 hours Microbiology Recent Results (from the past 240 hour(s))  BODY FLUID CULTURE     Status: None   Collection Time    01/08/14 12:33 PM      Result Value Ref Range Status   Specimen Description PLEURAL RIGHT   Final   Special Requests NONE   Final   Gram Stain     Final   Value: FEW WBC PRESENT,BOTH PMN AND MONONUCLEAR     NO ORGANISMS SEEN     Performed at [facility]   Culture     Final   Value: NO GROWTH 3 DAYS     Performed at [facility]   Report Status 01/11/2014 FINAL   Final   Studies:  Dg Chest 2 View  01/11/2014    CLINICAL DATA:  Shortness of breath.  Follow-up effusions.  EXAM: CHEST  2 VIEW  COMPARISON:  01/09/2014  FINDINGS: Heart is upper limits normal in size. Bilateral pleural effusions noted, left greater than right, similar to prior study. Bibasilar atelectasis. No acute bony abnormality.  IMPRESSION: Stable bilateral effusions, left slightly greater than right. Bibasilar atelectasis.   Electronically Signed   By: Erik Ramirez M.D.   On: 01/11/2014 12:04   Ir Fluoro Guide Cv Line Right  01/12/2014   INDICATION: History lymphoma. In need of intravenous access for chemotherapy administration  EXAM: IMPLANTED PORT A CATH PLACEMENT WITH ULTRASOUND AND FLUOROSCOPIC GUIDANCE  COMPARISON:  None.  MEDICATIONS: Vancomycin 1 gm IV; The antibiotic was administered within an appropriate time interval prior to skin puncture.  ANESTHESIA/SEDATION: Versed 1 mg IV; Fentanyl 50 mcg IV;  Total Moderate Sedation Time  30  minutes.  CONTRAST:  None  FLUOROSCOPY TIME:  24 seconds.  COMPLICATIONS: None immediate  PROCEDURE: The procedure, risks, benefits, and alternatives were explained to the patient. Questions regarding the procedure were encouraged and answered. The patient understands and consents to the procedure.  The right neck and chest were prepped with chlorhexidine in a sterile fashion, and a sterile drape was applied covering the operative field. Maximum barrier sterile technique with sterile gowns and gloves were used for the procedure. A timeout was performed prior to the initiation of the procedure. Local anesthesia was provided with 1% lidocaine with epinephrine.  After creating a small venotomy incision, a micropuncture kit was utilized to access the internal jugular vein under direct, real-time ultrasound guidance. Ultrasound image documentation was performed. The microwire was kinked to measure appropriate catheter length.  A subcutaneous port pocket was then created along the upper chest wall utilizing a combination  of sharp and blunt dissection. The pocket was irrigated with sterile saline. A single lumen ISP power injectable port was chosen for placement. The 8 Fr catheter was tunneled from the port pocket site to the venotomy incision. The port was placed in the pocket. The external catheter was trimmed to appropriate length. At the venotomy, an 8 Fr peel-away sheath was placed over a guidewire under fluoroscopic guidance. The catheter was then placed through the sheath and the sheath was removed. Final catheter positioning was confirmed and documented with a fluoroscopic spot radiograph. The port was accessed with a Huber needle, aspirated and flushed with heparinized saline.  The venotomy site was closed with an interrupted 4-0 Vicryl suture. The port pocket incision  was closed with interrupted 2-0 Vicryl suture and the skin was opposed with a running subcuticular 4-0 Vicryl suture. Dermabond and Steri-strips were applied to both incisions. Dressings were placed. The patient tolerated the procedure well without immediate post procedural complication.  FINDINGS: After catheter placement, the tip lies within the superior cavoatrial junction. The catheter aspirates and flushes normally and is ready for immediate use.  IMPRESSION: Successful placement of a right internal jugular approach power injectable Port-A-Cath. The catheter is ready for immediate use.   Electronically Signed   By: Sandi Mariscal M.D.   On: 01/12/2014 09:49   Ir US Guide Vasc Access Right  01/12/2014   INDICATION: History lymphoma. In need of intravenous access for chemotherapy administration  EXAM: IMPLANTED PORT A CATH PLACEMENT WITH ULTRASOUND AND FLUOROSCOPIC GUIDANCE  COMPARISON:  None.  MEDICATIONS: Vancomycin 1 gm IV; The antibiotic was administered within an appropriate time interval prior to skin puncture.  ANESTHESIA/SEDATION: Versed 1 mg IV; Fentanyl 50 mcg IV;  Total Moderate Sedation Time  30  minutes.  CONTRAST:  None  FLUOROSCOPY TIME:  24  seconds.  COMPLICATIONS: None immediate  PROCEDURE: The procedure, risks, benefits, and alternatives were explained to the patient. Questions regarding the procedure were encouraged and answered. The patient understands and consents to the procedure.  The right neck and chest were prepped with chlorhexidine in a sterile fashion, and a sterile drape was applied covering the operative field. Maximum barrier sterile technique with sterile gowns and gloves were used for the procedure. A timeout was performed prior to the initiation of the procedure. Local anesthesia was provided with 1% lidocaine with epinephrine.  After creating a small venotomy incision, a micropuncture kit was utilized to access the internal jugular vein under direct, real-time ultrasound guidance. Ultrasound image documentation was performed. The microwire was kinked to measure appropriate catheter length.  A subcutaneous port pocket was then created along the upper chest wall utilizing a combination of sharp and blunt dissection. The pocket was irrigated with sterile saline. A single lumen ISP power injectable port was chosen for placement. The 8 Fr catheter was tunneled from the port pocket site to the venotomy incision. The port was placed in the pocket. The external catheter was trimmed to appropriate length. At the venotomy, an 8 Fr peel-away sheath was placed over a guidewire under fluoroscopic guidance. The catheter was then placed through the sheath and the sheath was removed. Final catheter positioning was confirmed and documented with a fluoroscopic spot radiograph. The port was accessed with a Huber needle, aspirated and flushed with heparinized saline.  The venotomy site was closed with an interrupted 4-0 Vicryl suture. The port pocket incision was closed with interrupted 2-0 Vicryl suture and the skin was opposed with a running subcuticular 4-0 Vicryl suture. Dermabond and Steri-strips were applied to both incisions. Dressings were  placed. The patient tolerated the procedure well without immediate post procedural complication.  FINDINGS: After catheter placement, the tip lies within the superior cavoatrial junction. The catheter aspirates and flushes normally and is ready for immediate use.  IMPRESSION: Successful placement of a right internal jugular approach power injectable Port-A-Cath. The catheter is ready for immediate use.   Electronically Signed   By: Sandi Mariscal M.D.   On: 01/12/2014 09:49    Assessment: 80 y.o. [name] with a new diagnosis of diffuse large-cell, B-cell non-Hodgkin's lymphoma (1) rituximab given 01/07/2014, with initial infusion reaction but able to receive entire dose that evening; to be repeated every 21  days (2) prednisone started 01/07/2014, last dose 01/11/2014-- cbg's acceptable (on SSI) (3) dose-reduced CHOP 01/10/2014--to be repeated every 21 days  OTHER PROBLEMS: (a) thrombocytopenia: would hold anticoagulation for next 48 hours or so if OK w cardiology; his platelet count is likely to fall furthe with today's chemo; there is no contraindication to platelet transfusion if bleeding becomes an issue  (b) tumor lysis syndrome: very favorable initial reaction to treatment; LDH, uric acid normalizing  (c) AKI: creatinine has normalized  (d) A-fib-- has converted to SR-- further management per cardiology  Plan:  Patient is day 3 cycle 1 CHOP chemotherapy. Next chemotherapy 18 days from now  Plans for d/c noted. He should continue on allopurinol as outpatient. I have made him a return appt at the [facility] 2 PM OCT 7.  Appreciate Drs Tyrell Antonio and Zamora's help!  Chauncey Cruel, MD 01/12/2014  12:29 PM

## 2014-01-12 NOTE — Discharge Summary (Signed)
Physician Discharge Summary  Erik Ramirez VZC:[id number] DOB: [birth year] DOA: 01/05/2014  PCP: Erik Channel, MD  Admit date: 01/05/2014 Discharge date: 01/12/2014  Time spent: 35 minutes  Recommendations for Outpatient Follow-up:  1. Please follow up on blood sugars, he was hyperglycemic after receiving steroids during this hospitalization 2. Follow up on his respiratory status, undergoing thoracentesis during this hospitalization 3. Repeat BMP and CBC on hospital followup 4. Followup on his blood pressures, patient discharged on Cardizem and metoprolol 5. Patient was set up with home health services prior to discharge  Discharge Diagnoses:  Principal Problem:   Acute respiratory failure with hypoxia Active Problems:   Hypertension   Urethral stricture   Diabetes mellitus without complication   Obstructive sleep apnea   DLBCL (diffuse large B cell lymphoma)   CKD (chronic kidney disease), stage III   Atrial fibrillation with RVR   Discharge Condition: Stable/improved  Diet recommendation: Regular diet  Filed Weights   01/10/14 1100 01/11/14 0547 01/12/14 0420  Weight: 84.006 kg (185 lb 3.2 oz) 84.46 kg (186 lb 3.2 oz) 85.367 kg (188 lb 3.2 oz)    History of present illness:  Erik Ramirez is a 80 y.o. male with Past medical history of hypertension, dyslipidemia, bladder outlet obstruction with prostate surgery, diabetes mellitus, sleep apnea on CPAP.  Patient presents with complaints of shortness of breath progressively worsening over last 2 days associated with cough and fever.  Patient mentions that since last 5-6 weeks he has been having diffuse abdominal pain with distention. Along with that he had weight loss, night sweats, low-grade fever. With this he went to see his PCP was alert and CT scan of his abdomen at which time he was found to have multiple lymph nodes and which were suspicious for lymphoma.  He underwent a biopsy on August 31 which resulted in diffuse large  B-cell lymphoma and patient was scheduled to see oncology at [facility] long.  Since last 3-4 days the patient has been having progressively worsening shortness of breath. Patient denies any chest pain chest heaviness chest tightness dizziness lightheadedness.  Patient has chronic leg swelling which is unchanged and patient denies any leg tenderness.  Patient also denies any recent change in his medication.  No orthopnea or PND he is also reported.   Hospital Course:  Patient is a pleasant 80 year old gentleman with a past medical history of hypertension, dyslipidemia, diabetes, stroke for sleep apnea, large B cell lymphoma who was admitted to the medicine service on 01/05/2014 presenting with increasing shortness of breath associated with cough and fever. Initial workup revealed the presence of bilateral pleural effusions along with worsening lymphadenopathy seen on the CT scan. Hospitalization complicated by the development of atrial fibrillation with rapid ventricular response as he was treated with IV Cardizem initially, then transition to oral Cardizem after he converted to sinus rhythm. During this hospitalization he was seen and evaluated by medical oncology as he was started on Rituximab. After receiving IV steroids he was found to be hyperglycemic, or which she was given sliding scale insulin coverage. By 01/12/2014 he had been doing fairly well, was satting 93% on room air. Interventional radiology placing a right IJ Port-A-Cath with tip at the superior caval atrial junction, he tolerated procedure well there are no immediate complications. Home health services for RN and PT were set up prior to discharge.   Acute respiratory failure with hypoxia  Broad differential with progressive lymphoma/lymphadenopathy, community-acquired pneumonia, pulmonary embolism: The differential.  On empiric antibiotics to address possible  community-acquired pneumonia. Strep pneumonia negative/Legionella negative.  VQ  scan and lower extremity Dopplers negative 2-D echo: Ef 55 %, diastolic dysfunction grade 1.  Continue supportive care with oxygen as needed to maintain saturations.  Repeat chest x ray 9-26 showed worsening left opacification and pleural effusion. Requiring more oxygen, 5 L.  Patient S/P thoracentesis 9-26 yielding 1 L of bloody pleural fluid. Repeat chest x-ray on 01/09/2014 showing stable bilateral pleural effusions, left greater than right Atrial fibrillation with rapid ventricular response  Converted to sinus rhythm. Rate controlled. Will discharge on Cardizem and metoprolol  Continue 325 mg of aspirin therapy for now, not candidate for anticoagulation given thrombocytopenia Stage III chronic kidney disease  Current GFR 25-26.  Cr trend; 2 ---1.8---1.6 --1.9  Patient tolerating PO intake Hypertension  Continue Lopressor. Lisinopril stopped secondary to acute kidney injury DC Norvasc, patient now on Cardizem.  Diabetes mellitus with renal complications  Diet controlled, borderline per patient. Given the presence of hyperglycemia after the administration of IV steroids will discharge patient on sliding scale insulin. Obstructive sleep apnea  Continue nocturnal CPAP. DLBCL (diffuse large B cell lymphoma)  Dr Erik Ramirez following. Received prednisone and rituximab 9-25.  He developed reaction to rituximab. He received IV solumedrol, Pepcid, epinephrine. Reaction resolved after that.  He finished slow infusion of rituximab 9-26.  He received Rasburicase due to increase uric acid.  Patient to followup with medical oncology at the office  Procedures:  Right IJ Port-A-Cath placement performed on 01/12/2014 by interventional radiology  Consultations:  Medical oncology  Interventional radiology  Case manager  Social work  Discharge Exam: Filed Vitals:   01/12/14 0919  BP: 132/65  Pulse: 55  Temp:   Resp: 13    General: Patient states feeling fine, no acute distress,  tolerating by mouth intake Cardiovascular: Regular rate rhythm normal S1-S2 no murmurs rubs or gallops, has bilateral extremity pitting edema Respiratory: Has a few bibasilar crackles otherwise normal respiratory effort, off of supplemental oxygen Abdomen: Soft nontender nondistended  Discharge Instructions You were cared for by a hospitalist during your hospital stay. If you have any questions about your discharge medications or the care you received while you were in the hospital after you are discharged, you can call the unit and asked to speak with the hospitalist on call if the hospitalist that took care of you is not available. Once you are discharged, your primary care physician will handle any further medical issues. Please note that NO REFILLS for any discharge medications will be authorized once you are discharged, as it is imperative that you return to your primary care physician (or establish a relationship with a primary care physician if you do not have one) for your aftercare needs so that they can reassess your need for medications and monitor your lab values.  Discharge Instructions   Call MD for:  difficulty breathing, headache or visual disturbances    Complete by:  As directed      Call MD for:  extreme fatigue    Complete by:  As directed      Call MD for:  hives    Complete by:  As directed      Call MD for:  persistant dizziness or light-headedness    Complete by:  As directed      Call MD for:  persistant nausea and vomiting    Complete by:  As directed      Call MD for:  redness, tenderness, or signs of infection (pain, swelling,  redness, odor or green/yellow discharge around incision site)    Complete by:  As directed      Call MD for:  severe uncontrolled pain    Complete by:  As directed      Call MD for:  temperature >100.4    Complete by:  As directed      Diet - low sodium heart healthy    Complete by:  As directed      Increase activity slowly    Complete  by:  As directed      [place]    Complete by:  As directed   Chemotherapy Appointment - 2 hr     TREATMENT CONDITIONS    Complete by:  As directed   Notify the MD for the following lab values: ANC < 1500, Hemoglobin < 8, PLT < 100,000,  Creatinine > 1.5, urine output < 200 ml prior to cisplatin, Total Bili > 1.5, ALT & AST > 80. If labs are abnormal OR no lab data is available, or if patient has unstable vital signs: Temperature > 38.5, SBP > 180 or < 90, RR > 30 or HR > 100 MD must be notified and order obtained to begin chemotherapy.          Current Discharge Medication List    START taking these medications   Details  allopurinol (ZYLOPRIM) 300 MG tablet Take 1 tablet (300 mg total) by mouth daily. Qty: 30 tablet, Refills: 1   Associated Diagnoses: DLBCL (diffuse large B cell lymphoma)    diltiazem (CARDIZEM CD) 120 MG 24 hr capsule Take 1 capsule (120 mg total) by mouth daily. Qty: 30 capsule, Refills: 1    feeding supplement, ENSURE COMPLETE, (ENSURE COMPLETE) LIQD Take 237 mLs by mouth daily. Qty: 20 Bottle, Refills: 1    insulin aspart (NOVOLOG) 100 UNIT/ML injection Inject 0-20 Units into the skin 3 (three) times daily with meals. Qty: 10 mL, Refills: 11   Associated Diagnoses: DLBCL (diffuse large B cell lymphoma)    ondansetron (ZOFRAN) 8 MG tablet Take 1 tablet (8 mg total) by mouth every 12 (twelve) hours. Qty: 20 tablet, Refills: 0   Associated Diagnoses: DLBCL (diffuse large B cell lymphoma); Acute respiratory failure with hypoxia      CONTINUE these medications which have CHANGED   Details  aspirin 325 MG tablet Take 1 tablet (325 mg total) by mouth daily. Qty: 30 tablet, Refills: 0      CONTINUE these medications which have NOT CHANGED   Details  clorazepate (TRANXENE) 7.5 MG tablet Take 7.5 mg by mouth 2 (two) times daily as needed for anxiety or sleep.    clotrimazole-betamethasone (LOTRISONE) cream Apply 1 application topically 2 (two)  times daily.    metoprolol (LOPRESSOR) 50 MG tablet Take 25 mg by mouth 2 (two) times daily.      simvastatin (ZOCOR) 20 MG tablet Take 20 mg by mouth daily.    traMADol (ULTRAM) 50 MG tablet Take 50 mg by mouth every 6 (six) hours as needed for moderate pain.      STOP taking these medications     amLODipine (NORVASC) 10 MG tablet      lisinopril (PRINIVIL,ZESTRIL) 20 MG tablet        Allergies  Allergen Reactions  . Rituximab Other (See Comments)    Developed wheezing & rigors when infusion rate increased to 100 mg/hr.  Tolerates rate of 50 mg/hr.  . Caffeine Other (See Comments)    Makes patient  feel like he's going to die  . Morphine And Related Other (See Comments)    excitability  . Penicillins Rash   Follow-up Information   Follow up with CABEZA,YURI, MD In 1 week.   Specialty:  Internal Medicine   Contact information:   [address] [place] [address] [phone]       Follow up with Chauncey Cruel, MD In 1 week.   Specialty:  Oncology   Contact information:   [place] [address] [phone]        The results of significant diagnostics from this hospitalization (including imaging, microbiology, ancillary and laboratory) are listed below for reference.    Significant Diagnostic Studies: Dg Chest 1 View  01/08/2014   CLINICAL DATA:  Post thoracentesis.  EXAM: CHEST - 1 VIEW  COMPARISON:  01/08/2014  FINDINGS: Improved aeration at the right lung base consistent with a recent thoracentesis. There is decreased right pleural fluid. Persistent densities at the left lung base suggest pleural fluid and atelectasis. Heart size is grossly stable. The trachea is midline.  IMPRESSION: Negative for a pneumothorax following the right thoracentesis. Decreased right pleural effusion.  Left basilar densities are compatible with pleural fluid.   Electronically Signed   By: Markus Daft M.D.   On: 01/08/2014 13:48   Dg Chest 2  View  01/11/2014   CLINICAL DATA:  Shortness of breath.  Follow-up effusions.  EXAM: CHEST  2 VIEW  COMPARISON:  01/09/2014  FINDINGS: Heart is upper limits normal in size. Bilateral pleural effusions noted, left greater than right, similar to prior study. Bibasilar atelectasis. No acute bony abnormality.  IMPRESSION: Stable bilateral effusions, left slightly greater than right. Bibasilar atelectasis.   Electronically Signed   By: Rolm Baptise M.D.   On: 01/11/2014 12:04   Dg Chest 2 View  01/09/2014   CLINICAL DATA:  Pleural effusion.  EXAM: CHEST  2 VIEW  COMPARISON:  January 08, 2014.  FINDINGS: Stable cardiomediastinal silhouette. No pneumothorax is noted. Minimal right pleural effusion is noted. Otherwise right lung is clear. Stable mild left pleural effusion is noted with possible underlying atelectasis.  IMPRESSION: Stable bilateral pleural effusions with left greater than right.   Electronically Signed   By: Sabino Dick M.D.   On: 01/09/2014 15:46   Dg Chest 2 View  01/08/2014   CLINICAL DATA:  Followup effusion. Cough, shortness of breath and weakness.  EXAM: CHEST  2 VIEW  COMPARISON:  01/05/2014 as well as chest CT 01/05/2014  FINDINGS: Lungs are hypoinflated with persistent hazy right basilar opacification compatible with known small effusion and atelectasis. Slight worsening opacification the left base compatible known moderate effusions/atelectasis. Cannot completely exclude infection in the lung bases. Prominence of the right hilar region compatible with known adenopathy in this patient with a history of lymphoma. Cardiomediastinal silhouette and remainder the exam is unchanged.  IMPRESSION: Stable right base opacification compatible with known small effusion and associated atelectasis. Slight worsening left base opacification compatible with moderate left effusion and atelectasis. Cannot exclude infection in the lung bases.  Prominent right hilum compatible is known adenopathy in this  patient with history of lymphoma.   Electronically Signed   By: Marin Olp M.D.   On: 01/08/2014 10:18   Dg Chest 2 View  01/05/2014   CLINICAL DATA:  Shortness of breath.  EXAM: CHEST  2 VIEW  COMPARISON:  None available.  FINDINGS: The cardiac silhouette, mediastinal and hilar contours are somewhat prominent.  The upper mediastinum is slightly widened and the right pulmonary hila is prominent. There are small bilateral pleural effusions and overlying atelectasis. No edema or pneumothorax. The bony thorax is grossly intact.  IMPRESSION: Prominent superior mediastinal contours and prominent right hilum. Chest CT may be helpful for further evaluation.  Bilateral pleural effusions and bibasilar atelectasis.   Electronically Signed   By: Kalman Jewels M.D.   On: 01/05/2014 22:32   Ct Chest Wo Contrast  01/05/2014   CLINICAL DATA:  Chest pain.  Recent diagnosis of lymphoma.  EXAM: CT CHEST WITHOUT CONTRAST  TECHNIQUE: Multidetector CT imaging of the chest was performed following the standard protocol without IV contrast.  COMPARISON:  None.  FINDINGS: THORACIC INLET/BODY WALL:  Adenopathy at the thoracic inlet, with a node posterior to the left IJ measuring 15 mm short axis. Bilateral axillary lymphadenopathy, with a solid node on the left measuring 22 mm in short axis on image 18.  MEDIASTINUM:  No cardiomegaly or pericardial effusion. Diffuse mediastinal lymphadenopathy, present in all stations. Index node in the sub- carina region measures 28 mm short axis, larger than on abdominal imaging. Index left anterior juxtadiaphragmatic node is also larger at 14 mm (previously 10 mm). Bilateral hilar lymphadenopathy. Negative esophagus. Diffuse atherosclerosis, including the coronary arteries. No evidence of acute vascular finding.  LUNG WINDOWS:  Small, layering water density pleural effusions bilaterally. There is atelectasis in both lower lobes.  7 mm nodule in the right middle lobe. This will be re-assesed at  surveillance imaging.  Spiculated density at the right apex favors pleural/parenchymal scarring.  No evidence of edema, pneumothorax, or pneumonia.  UPPER ABDOMEN:  Diffuse adenopathy in the upper abdominal ligaments. No definitive progression since recent abdominal CT. The spleen is partially imaged but clearly enlarged.  OSSEOUS:  No acute fracture.  No suspicious lytic or blastic lesions.  IMPRESSION: 1. Diffuse lymphoma, with intrathoracic progression since 12/13/2013. 2. Small bilateral pleural effusions with lower lobe atelectasis.   Electronically Signed   By: [name] M.D.   On: 01/05/2014 23:03   Nm Pulmonary Perf And Vent  01/06/2014   CLINICAL DATA:  Shortness of breath question pulmonary embolism, history hypertension, diabetes, lymphoma  EXAM: NUCLEAR MEDICINE VENTILATION - PERFUSION LUNG SCAN  TECHNIQUE: Ventilation images were obtained in multiple projections using inhaled aerosol technetium 99 M DTPA. Perfusion images were obtained in multiple projections after intravenous injection of Tc-38mMAA.  RADIOPHARMACEUTICALS:  41.0 mCi Tc-964mTPA aerosol inhalation and 5.0 mCi Tc-996mA IV  COMPARISON:  None; correlation chest radiograph and CT chest exams of 01/05/2014  FINDINGS: Ventilation: Mild central airway deposition of tracer. Minimal peripheral regularity of peripheral ventilation in both lungs.  Perfusion: Irregular areas of nonsegmental diminished perfusion at the lateral and posterolateral RIGHT lung and at the posterior LEFT lung, corresponding to pleural effusions identified on chest CT. Small subsegmental perfusion defect RIGHT lower lobe. No additional segmental or subsegmental perfusion defects identified.  Chest CT demonstrates small to moderate-sized pleural effusions bilaterally larger on RIGHT with compressive atelectasis of BILATERAL lower lobes also greater on RIGHT.  IMPRESSION: BILATERAL pleural effusions causing compression of the lungs bilaterally.  Single  subsegmental perfusion defect RIGHT lower lobe.  Findings represent a low probability for pulmonary embolism.   Electronically Signed   By: MarLavonia DanaD.   On: 01/06/2014 12:19   Ir Fluoro Guide Cv Line Right  01/12/2014   INDICATION: History lymphoma. In need of intravenous access for chemotherapy administration  EXAM: IMPLANTED  PORT A CATH PLACEMENT WITH ULTRASOUND AND FLUOROSCOPIC GUIDANCE  COMPARISON:  None.  MEDICATIONS: Vancomycin 1 gm IV; The antibiotic was administered within an appropriate time interval prior to skin puncture.  ANESTHESIA/SEDATION: Versed 1 mg IV; Fentanyl 50 mcg IV;  Total Moderate Sedation Time  30  minutes.  CONTRAST:  None  FLUOROSCOPY TIME:  24 seconds.  COMPLICATIONS: None immediate  PROCEDURE: The procedure, risks, benefits, and alternatives were explained to the patient. Questions regarding the procedure were encouraged and answered. The patient understands and consents to the procedure.  The right neck and chest were prepped with chlorhexidine in a sterile fashion, and a sterile drape was applied covering the operative field. Maximum barrier sterile technique with sterile gowns and gloves were used for the procedure. A timeout was performed prior to the initiation of the procedure. Local anesthesia was provided with 1% lidocaine with epinephrine.  After creating a small venotomy incision, a micropuncture kit was utilized to access the internal jugular vein under direct, real-time ultrasound guidance. Ultrasound image documentation was performed. The microwire was kinked to measure appropriate catheter length.  A subcutaneous port pocket was then created along the upper chest wall utilizing a combination of sharp and blunt dissection. The pocket was irrigated with sterile saline. A single lumen ISP power injectable port was chosen for placement. The 8 Fr catheter was tunneled from the port pocket site to the venotomy incision. The port was placed in the pocket. The external  catheter was trimmed to appropriate length. At the venotomy, an 8 Fr peel-away sheath was placed over a guidewire under fluoroscopic guidance. The catheter was then placed through the sheath and the sheath was removed. Final catheter positioning was confirmed and documented with a fluoroscopic spot radiograph. The port was accessed with a Huber needle, aspirated and flushed with heparinized saline.  The venotomy site was closed with an interrupted 4-0 Vicryl suture. The port pocket incision was closed with interrupted 2-0 Vicryl suture and the skin was opposed with a running subcuticular 4-0 Vicryl suture. Dermabond and Steri-strips were applied to both incisions. Dressings were placed. The patient tolerated the procedure well without immediate post procedural complication.  FINDINGS: After catheter placement, the tip lies within the superior cavoatrial junction. The catheter aspirates and flushes normally and is ready for immediate use.  IMPRESSION: Successful placement of a right internal jugular approach power injectable Port-A-Cath. The catheter is ready for immediate use.   Electronically Signed   By: Sandi Mariscal M.D.   On: 01/12/2014 09:49   Ir US Guide Vasc Access Right  01/12/2014   INDICATION: History lymphoma. In need of intravenous access for chemotherapy administration  EXAM: IMPLANTED PORT A CATH PLACEMENT WITH ULTRASOUND AND FLUOROSCOPIC GUIDANCE  COMPARISON:  None.  MEDICATIONS: Vancomycin 1 gm IV; The antibiotic was administered within an appropriate time interval prior to skin puncture.  ANESTHESIA/SEDATION: Versed 1 mg IV; Fentanyl 50 mcg IV;  Total Moderate Sedation Time  30  minutes.  CONTRAST:  None  FLUOROSCOPY TIME:  24 seconds.  COMPLICATIONS: None immediate  PROCEDURE: The procedure, risks, benefits, and alternatives were explained to the patient. Questions regarding the procedure were encouraged and answered. The patient understands and consents to the procedure.  The right neck and  chest were prepped with chlorhexidine in a sterile fashion, and a sterile drape was applied covering the operative field. Maximum barrier sterile technique with sterile gowns and gloves were used for the procedure. A timeout was performed prior to the initiation of  the procedure. Local anesthesia was provided with 1% lidocaine with epinephrine.  After creating a small venotomy incision, a micropuncture kit was utilized to access the internal jugular vein under direct, real-time ultrasound guidance. Ultrasound image documentation was performed. The microwire was kinked to measure appropriate catheter length.  A subcutaneous port pocket was then created along the upper chest wall utilizing a combination of sharp and blunt dissection. The pocket was irrigated with sterile saline. A single lumen ISP power injectable port was chosen for placement. The 8 Fr catheter was tunneled from the port pocket site to the venotomy incision. The port was placed in the pocket. The external catheter was trimmed to appropriate length. At the venotomy, an 8 Fr peel-away sheath was placed over a guidewire under fluoroscopic guidance. The catheter was then placed through the sheath and the sheath was removed. Final catheter positioning was confirmed and documented with a fluoroscopic spot radiograph. The port was accessed with a Huber needle, aspirated and flushed with heparinized saline.  The venotomy site was closed with an interrupted 4-0 Vicryl suture. The port pocket incision was closed with interrupted 2-0 Vicryl suture and the skin was opposed with a running subcuticular 4-0 Vicryl suture. Dermabond and Steri-strips were applied to both incisions. Dressings were placed. The patient tolerated the procedure well without immediate post procedural complication.  FINDINGS: After catheter placement, the tip lies within the superior cavoatrial junction. The catheter aspirates and flushes normally and is ready for immediate use.   IMPRESSION: Successful placement of a right internal jugular approach power injectable Port-A-Cath. The catheter is ready for immediate use.   Electronically Signed   By: Sandi Mariscal M.D.   On: 01/12/2014 09:49   US Thoracentesis Asp Pleural Space W/img Guide  01/08/2014   CLINICAL DATA:  Shortness of breath, bilateral pleural effusions. History of lymphoma. Request diagnostic and therapeutic thoracentesis.  EXAM: ULTRASOUND GUIDED RIGHT THORACENTESIS  COMPARISON:  None  PROCEDURE: An ultrasound guided thoracentesis was thoroughly discussed with the patient and questions answered. The benefits, risks, alternatives and complications were also discussed. The patient understands and wishes to proceed with the procedure. Written consent was obtained.  Ultrasound of the chest finds small to moderate size left pleural effusion and a moderate to large right pleural effusion. Right side is chosen for thoracentesis.  Ultrasound was performed to localize and mark an adequate pocket of fluid in the right chest. The area was then prepped and draped in the normal sterile fashion. 1% Lidocaine was used for local anesthesia. Under ultrasound guidance a 19 gauge Yueh catheter was introduced. Thoracentesis was performed. The catheter was removed and a dressing applied.  Complications:  None immediate  FINDINGS: A total of approximately 1 L of bloody pleural fluid was removed. A fluid sample wassent for laboratory analysis.  IMPRESSION: Successful ultrasound guided right thoracentesis yielding 1 L of pleural fluid.  Read by: Ascencion Dike PA-C   Electronically Signed   By: Daryll Brod M.D.   On: 01/08/2014 12:59    Microbiology: Recent Results (from the past 240 hour(s))  BODY FLUID CULTURE     Status: None   Collection Time    01/08/14 12:33 PM      Result Value Ref Range Status   Specimen Description PLEURAL RIGHT   Final   Special Requests NONE   Final   Gram Stain     Final   Value: FEW WBC PRESENT,BOTH PMN AND  MONONUCLEAR     NO ORGANISMS SEEN  Performed at [facility]     Final   Value: NO GROWTH 3 DAYS     Performed at [facility]   Report Status 01/11/2014 FINAL   Final     Labs: Basic Metabolic Panel:  Recent Labs Lab 01/08/14 1703 01/09/14 0435 01/10/14 0432 01/11/14 0347 01/12/14 0418  NA 140 143 140 135* 138  K 4.5 4.8 4.2 4.6 4.8  CL 104 107 106 100 104  CO2 20 23 22 24 24   GLUCOSE 207* 217* 187* 179* 183*  BUN 52* 58* 53* 44* 46*  CREATININE 1.89* 1.92* 1.59* 1.14 1.24  CALCIUM 8.0* 7.9* 8.1* 7.7* 7.9*   Liver Function Tests:  Recent Labs Lab 01/08/14 0517 01/09/14 0435 01/10/14 0432 01/11/14 0347 01/12/14 0418  AST 149* 137* 50* 47* 29  ALT 21 19 21  37 31  ALKPHOS 138* 121* 122* 119* 100  BILITOT 0.3 0.4 0.5 0.5 0.6  PROT 5.9* 5.6* 5.9* 5.6* 5.3*  ALBUMIN 2.0* 2.0* 2.1* 2.2* 2.1*   No results found for this basename: LIPASE, AMYLASE,  in the last 168 hours No results found for this basename: AMMONIA,  in the last 168 hours CBC:  Recent Labs Lab 01/08/14 0517 01/09/14 0435 01/10/14 0432 01/11/14 0347 01/12/14 0418  WBC 25.5* 17.0* 13.9* 11.6* 10.3  NEUTROABS 22.4* 14.6* 11.6* 9.7* 8.5*  HGB 11.6* 10.4* 10.8* 10.6* 10.6*  HCT 34.6* 30.8* 32.8* 31.1* 31.4*  MCV 85.6 84.6 86.5 84.7 85.1  PLT 64* 62* 57* 68* 79*   Cardiac Enzymes:  Recent Labs Lab 01/06/14 1709 01/06/14 2310 01/07/14 0438  TROPONINI <0.30 <0.30 <0.30   BNP: BNP (last 3 results)  Recent Labs  01/05/14 2142  PROBNP 343.7   CBG:  Recent Labs Lab 01/11/14 0722 01/11/14 1207 01/11/14 1806 01/11/14 2144 01/12/14 0748  GLUCAP 145* 219* 167* 210* 157*       Signed:  Cariana Karge  [facility] 01/12/2014, 12:00 PM

## 2014-01-12 NOTE — Telephone Encounter (Signed)
per pof to sch appt per GM-sent emailt o MW to sch trmt-will call pt once reply

## 2014-01-12 NOTE — Discharge Instructions (Signed)

## 2014-01-12 NOTE — Care Management Note (Signed)
CARE MANAGEMENT NOTE 01/12/2014  Patient:  Bugge,Kylie   Account Number:  [id number]  Date Initiated:  01/11/2014  Documentation initiated by:  Marney Doctor  Subjective/Objective Assessment:   80 yo admitted with dyspnea.  recently diagnosed large B cell lymphoma who was admitted on 01/05/14     Action/Plan:   From home with wife   Anticipated DC Date:  01/12/2014   Anticipated DC Plan:  [place]  CM consult      Choice offered to / List presented to:  C-1 Patient   DME arranged  Vassie Moselle      DME agency  [place] arranged  [place].   Status of service:  Completed, signed off Medicare Important Message given?  YES (If response is "NO", the following Medicare IM given date fields will be blank) Date Medicare IM given:  01/12/2014 Medicare IM given by:  Marney Doctor Date Additional Medicare IM given:   Additional Medicare IM given by:    Discharge Disposition:    Per UR Regulation:  Reviewed for med. necessity/level of care/duration of stay  If discussed at [place] of Stay Meetings, dates discussed:   01/11/2014    Comments:  01/12/14 Marney Doctor RN,BSN,NCM Was called to room by pts RN due to son wanting to speak with me.  Son is asking for a script for a glucometer and strips.  When informed that it is over the counter and insurance no longer pays for glucometers son is insistent that pts insurance will pay for it.  Dr. Coralyn Pear wrote script for glucometer, strips and lancets, and this was given to the son.  I told the son that if insurance would not pay for it then he could get a very cheap one a [facility] for $10.  Pts son is also requesting that pt have the rolling walker that pt stated he didn't need yesterday. [facility] [employer] rep to deliver rolling walker and order placed in computer.  No other DC needs noted.  01/11/14 Marney Doctor RN,BSN,NCM PT  recommended HHPT and RW.  Pt given choice and he chose [facility].  He also states that he doesn't need a walker because his wife has one he can use.  [place] contacted and given referral.  Unsure at this time if pt will need home O2.  CM will continue to follow.

## 2014-01-12 NOTE — Telephone Encounter (Signed)
Per staff phone call and POF I have tried to schedule appts. Scheduler advised need of clarification of orders.  JMW

## 2014-01-12 NOTE — Procedures (Signed)
Successful placement of right IJ approach port-a-cath with tip at the superior caval atrial junction. The catheter is ready for immediate use. No immediate post procedural complications. 

## 2014-01-13 ENCOUNTER — Telehealth: Payer: Self-pay | Admitting: *Deleted

## 2014-01-13 NOTE — Telephone Encounter (Signed)
Per staff messages and POF I have scheduled appts. Advised scheduler of appts. Advised scheduler that the patient needs to be in chemo earlier   JMW

## 2014-01-13 NOTE — Telephone Encounter (Signed)
This RN spoke with pt's son as well as the wife and pt per call regarding concern for constipation.  Pt states he has been constipated since being admitted to the hospital but having some stool passing early am 9/30.  He is passing gas.  Per discussion this RN informed family of use of miralax for constipation and advised on how to use it including good hydration.  Labs reviewed and discussion of use of suppository reviewed as well.  Pt and family understand to call if concern continues or other issues arise.

## 2014-01-14 ENCOUNTER — Other Ambulatory Visit: Payer: Self-pay | Admitting: *Deleted

## 2014-01-14 ENCOUNTER — Telehealth: Payer: Self-pay | Admitting: *Deleted

## 2014-01-14 ENCOUNTER — Telehealth: Payer: Self-pay | Admitting: Oncology

## 2014-01-14 ENCOUNTER — Ambulatory Visit: Payer: Medicare Other

## 2014-01-14 ENCOUNTER — Other Ambulatory Visit (HOSPITAL_BASED_OUTPATIENT_CLINIC_OR_DEPARTMENT_OTHER): Payer: Medicare Other | Admitting: Nurse Practitioner

## 2014-01-14 ENCOUNTER — Ambulatory Visit (HOSPITAL_BASED_OUTPATIENT_CLINIC_OR_DEPARTMENT_OTHER): Payer: Medicare Other | Admitting: Nurse Practitioner

## 2014-01-14 ENCOUNTER — Telehealth: Payer: Self-pay | Admitting: Nurse Practitioner

## 2014-01-14 VITALS — BP 154/65 | HR 63 | Temp 98.1°F | Resp 18 | Ht 67.0 in | Wt 188.8 lb

## 2014-01-14 DIAGNOSIS — K1231 Oral mucositis (ulcerative) due to antineoplastic therapy: Secondary | ICD-10-CM

## 2014-01-14 DIAGNOSIS — R21 Rash and other nonspecific skin eruption: Secondary | ICD-10-CM

## 2014-01-14 DIAGNOSIS — C8330 Diffuse large B-cell lymphoma, unspecified site: Secondary | ICD-10-CM

## 2014-01-14 DIAGNOSIS — E8809 Other disorders of plasma-protein metabolism, not elsewhere classified: Secondary | ICD-10-CM

## 2014-01-14 DIAGNOSIS — D696 Thrombocytopenia, unspecified: Secondary | ICD-10-CM

## 2014-01-14 LAB — COMPREHENSIVE METABOLIC PANEL (CC13)
ALBUMIN: 2 g/dL — AB (ref 3.5–5.0)
ALK PHOS: 85 U/L (ref 40–150)
ALT: 28 U/L (ref 0–55)
AST: 32 U/L (ref 5–34)
Anion Gap: 4 mEq/L (ref 3–11)
BUN: 33.3 mg/dL — ABNORMAL HIGH (ref 7.0–26.0)
CO2: 29 mEq/L (ref 22–29)
Calcium: 8.1 mg/dL — ABNORMAL LOW (ref 8.4–10.4)
Chloride: 106 mEq/L (ref 98–109)
Creatinine: 1.1 mg/dL (ref 0.7–1.3)
Glucose: 146 mg/dl — ABNORMAL HIGH (ref 70–140)
POTASSIUM: 4.1 meq/L (ref 3.5–5.1)
SODIUM: 140 meq/L (ref 136–145)
TOTAL PROTEIN: 4.9 g/dL — AB (ref 6.4–8.3)
Total Bilirubin: 0.74 mg/dL (ref 0.20–1.20)

## 2014-01-14 LAB — CBC WITH DIFFERENTIAL/PLATELET
BASO%: 0.3 % (ref 0.0–2.0)
Basophils Absolute: 0 10*3/uL (ref 0.0–0.1)
EOS ABS: 0.1 10*3/uL (ref 0.0–0.5)
EOS%: 1.8 % (ref 0.0–7.0)
HCT: 33.1 % — ABNORMAL LOW (ref 38.4–49.9)
HGB: 10.5 g/dL — ABNORMAL LOW (ref 13.0–17.1)
LYMPH%: 10 % — ABNORMAL LOW (ref 14.0–49.0)
MCH: 28.2 pg (ref 27.2–33.4)
MCHC: 31.8 g/dL — AB (ref 32.0–36.0)
MCV: 88.8 fL (ref 79.3–98.0)
MONO#: 0.4 10*3/uL (ref 0.1–0.9)
MONO%: 4.9 % (ref 0.0–14.0)
NEUT%: 83 % — ABNORMAL HIGH (ref 39.0–75.0)
NEUTROS ABS: 6.1 10*3/uL (ref 1.5–6.5)
Platelets: 82 10*3/uL — ABNORMAL LOW (ref 140–400)
RBC: 3.73 10*6/uL — AB (ref 4.20–5.82)
RDW: 14.7 % — AB (ref 11.0–14.6)
WBC: 7.4 10*3/uL (ref 4.0–10.3)
lymph#: 0.7 10*3/uL — ABNORMAL LOW (ref 0.9–3.3)

## 2014-01-14 MED ORDER — MAGIC MOUTHWASH: 5.0000 mL | Freq: Four times a day (QID) | ORAL | Status: DC | PRN

## 2014-01-14 NOTE — Telephone Encounter (Signed)
cld & spoke to pt son and gave pt sch-son understood

## 2014-01-14 NOTE — Telephone Encounter (Signed)
Received call from son Enis Slipper with couple concerns :   1.  Pt had new portacath inserted on 01/12/14.   Per son, pt had small amount of blood noted around port site, no swelling, but has bruises.  Aaron Edelman wanted to know if it is normal. 2.  Pt developed red rashes - whelps in clusters from back of trunk to around both inner thighs, up to front and both arms.  Aaron Edelman stated pt denied pain nor itching. Cindee, NP notified.   Spoke with Aaron Edelman and instructed Aaron Edelman to bring pt in now for symptom management NP to evaluate pt.  Aaron Edelman voiced understanding.

## 2014-01-14 NOTE — Telephone Encounter (Signed)
per 10/2 pof sent 12:28pm. please do labs before seeing CB. pt already seen by CB and sent back to lab. no other orders or additiional pof's.

## 2014-01-16 ENCOUNTER — Emergency Department (HOSPITAL_COMMUNITY): Payer: Medicare Other

## 2014-01-16 ENCOUNTER — Encounter (HOSPITAL_COMMUNITY): Payer: Self-pay | Admitting: Emergency Medicine

## 2014-01-16 ENCOUNTER — Emergency Department (HOSPITAL_COMMUNITY)
Admission: EM | Admit: 2014-01-16 | Discharge: 2014-01-17 | Disposition: A | Discharge status: Home / Self Care | Payer: Medicare Other | Attending: Emergency Medicine | Admitting: Emergency Medicine

## 2014-01-16 DIAGNOSIS — Z862 Personal history of diseases of the blood and blood-forming organs and certain disorders involving the immune mechanism: Secondary | ICD-10-CM | POA: Diagnosis not present

## 2014-01-16 DIAGNOSIS — Z8572 Personal history of non-Hodgkin lymphomas: Secondary | ICD-10-CM | POA: Insufficient documentation

## 2014-01-16 DIAGNOSIS — E119 Type 2 diabetes mellitus without complications: Secondary | ICD-10-CM | POA: Diagnosis not present

## 2014-01-16 DIAGNOSIS — Z8669 Personal history of other diseases of the nervous system and sense organs: Secondary | ICD-10-CM | POA: Insufficient documentation

## 2014-01-16 DIAGNOSIS — F419 Anxiety disorder, unspecified: Secondary | ICD-10-CM | POA: Insufficient documentation

## 2014-01-16 DIAGNOSIS — Z79899 Other long term (current) drug therapy: Secondary | ICD-10-CM | POA: Diagnosis not present

## 2014-01-16 DIAGNOSIS — Z794 Long term (current) use of insulin: Secondary | ICD-10-CM | POA: Insufficient documentation

## 2014-01-16 DIAGNOSIS — Z880 Allergy status to penicillin: Secondary | ICD-10-CM | POA: Diagnosis not present

## 2014-01-16 DIAGNOSIS — E785 Hyperlipidemia, unspecified: Secondary | ICD-10-CM | POA: Insufficient documentation

## 2014-01-16 DIAGNOSIS — R509 Fever, unspecified: Secondary | ICD-10-CM | POA: Insufficient documentation

## 2014-01-16 DIAGNOSIS — I10 Essential (primary) hypertension: Secondary | ICD-10-CM | POA: Diagnosis not present

## 2014-01-16 DIAGNOSIS — Z7982 Long term (current) use of aspirin: Secondary | ICD-10-CM | POA: Diagnosis not present

## 2014-01-16 DIAGNOSIS — Z87438 Personal history of other diseases of male genital organs: Secondary | ICD-10-CM | POA: Insufficient documentation

## 2014-01-16 MED ORDER — SODIUM CHLORIDE 0.9 % IV SOLN
Freq: Once | INTRAVENOUS | Status: AC
  Administered 2014-01-17: 01:00:00 via INTRAVENOUS

## 2014-01-16 NOTE — ED Notes (Signed)
Patient reports that he has had a fever since around 2000.  He is a currently receiving chemotherapy treatments.  Reports frequent bowel movements, but states they are not loose.

## 2014-01-16 NOTE — ED Provider Notes (Signed)
CSN: [id number]     Arrival date & time 01/16/14  2306 History   First MD Initiated Contact with Patient 01/16/14 2326     Chief Complaint  Patient presents with  . Fever     (Consider location/radiation/quality/duration/timing/severity/associated sxs/prior Treatment) Patient is a 80 y.o. male presenting with fever. The history is provided by the patient and a relative.  Fever Max temp prior to arrival:  102 Temp source:  Oral Severity:  Moderate Onset quality:  Gradual Duration:  2 hours Timing:  Constant Progression:  Unchanged Chronicity:  New Relieved by:  Nothing Ineffective treatments:  None tried Associated symptoms: no chest pain, no chills, no confusion, no congestion, no cough, no diarrhea, no dysuria, no ear pain, no headaches, no myalgias, no nausea, no rash, no rhinorrhea, no somnolence, no sore throat and no vomiting   Risk factors: hx of cancer and immunosuppression     Past Medical History  Diagnosis Date  . Hypertension   . Hyperlipemia   . Anxiety   . Lymphadenopathy, abdominal   . Urethral stricture   . Anemia   . BPPV (benign paroxysmal positional vertigo)   . Lymphoma   . Diabetes mellitus without complication   . Obstructive sleep apnea    Past Surgical History  Procedure Laterality Date  . Back surgery    . Prostate surgery    . Tonsillectomy     History reviewed. No pertinent family history. History  Substance Use Topics  . Smoking status: Never Smoker   . Smokeless tobacco: Not on file  . Alcohol Use: No    Review of Systems  Constitutional: Positive for fever. Negative for chills.  HENT: Negative for congestion, ear pain, rhinorrhea and sore throat.   Respiratory: Negative for cough.   Cardiovascular: Negative for chest pain.  Gastrointestinal: Negative for nausea, vomiting and diarrhea.  Genitourinary: Negative for dysuria.  Musculoskeletal: Negative for myalgias.  Skin: Negative for rash.  Neurological: Negative for headaches.   Psychiatric/Behavioral: Negative for confusion.  All other systems reviewed and are negative.     Allergies  Rituximab; Caffeine; Morphine and related; and Penicillins  Home Medications   Prior to Admission medications   Medication Sig Start Date End Date Taking? Authorizing Provider  allopurinol (ZYLOPRIM) 300 MG tablet Take 1 tablet (300 mg total) by mouth daily. 01/12/14  Yes Kelvin Cellar, MD  Alum & Mag Hydroxide-Simeth (MAGIC MOUTHWASH) SOLN Take 5 mLs by mouth 4 (four) times daily as needed for mouth pain. 01/14/14  Yes Drue Second, NP  aspirin 325 MG tablet Take 1 tablet (325 mg total) by mouth daily. 01/12/14  Yes Kelvin Cellar, MD  clorazepate (TRANXENE) 7.5 MG tablet Take 7.5 mg by mouth 2 (two) times daily as needed for anxiety or sleep.   Yes Historical Provider, MD  diltiazem (CARDIZEM CD) 120 MG 24 hr capsule Take 1 capsule (120 mg total) by mouth daily. 01/12/14  Yes Kelvin Cellar, MD  feeding supplement, ENSURE COMPLETE, (ENSURE COMPLETE) LIQD Take 237 mLs by mouth daily. 01/12/14  Yes Kelvin Cellar, MD  insulin aspart (NOVOLOG) 100 UNIT/ML injection Inject 0-20 Units into the skin 3 (three) times daily with meals. 01/12/14  Yes Kelvin Cellar, MD  metoprolol (LOPRESSOR) 50 MG tablet Take 25 mg by mouth 2 (two) times daily.     Yes Historical Provider, MD  ondansetron (ZOFRAN) 8 MG tablet Take 1 tablet (8 mg total) by mouth every 12 (twelve) hours. 01/12/14  Yes Kelvin Cellar, MD  ondansetron [facility])  8 MG tablet Take 8 mg by mouth 2 (two) times daily as needed for nausea or vomiting.   Yes Historical Provider, MD  simvastatin (ZOCOR) 20 MG tablet Take 20 mg by mouth daily.   Yes Historical Provider, MD  traMADol (ULTRAM) 50 MG tablet Take 50 mg by mouth every 6 (six) hours as needed for moderate pain.   Yes Historical Provider, MD  clotrimazole-betamethasone (LOTRISONE) cream Apply 1 application topically 2 (two) times daily.    Historical Provider, MD  naproxen  (NAPROSYN) 375 MG tablet Take 1 tablet (375 mg total) by mouth 3 (three) times daily with meals. 01/17/14   Garald Balding, NP   BP 130/58  Pulse 75  Temp(Src) 99.3 F (37.4 C) (Oral)  Resp 18  Wt 185 lb (83.915 kg)  SpO2 92% Physical Exam  Nursing note and vitals reviewed. Constitutional: He is oriented to person, place, and time. He appears well-developed and well-nourished. No distress.  HENT:  Head: Normocephalic and atraumatic.  Mouth/Throat: Uvula is midline. Oropharyngeal exudate and posterior oropharyngeal erythema present.  Thrush present on tongue  Eyes: Pupils are equal, round, and reactive to light.  Neck: Normal range of motion.  Cardiovascular: Normal rate and regular rhythm.   Pulmonary/Chest: Effort normal and breath sounds normal. No respiratory distress. He has no wheezes. He has no rales. He exhibits no tenderness.  New POC R upper chest with surrounding bruising no erythremia, drainage for surgical sites   Abdominal: Soft. Bowel sounds are normal. He exhibits no distension. There is no tenderness.  Musculoskeletal: Normal range of motion. He exhibits no edema and no tenderness.  Lymphadenopathy:    He has no cervical adenopathy.  Neurological: He is alert and oriented to person, place, and time.  Skin: Skin is warm. No rash noted. No erythema. There is pallor.    ED Course  Procedures (including critical care time) Labs Review Labs Reviewed  CBC - Abnormal; Notable for the following:    WBC 3.9 (*)    RBC 3.19 (*)    Hemoglobin 9.2 (*)    HCT 27.0 (*)    Platelets 102 (*)    All other components within normal limits  URINALYSIS, ROUTINE W REFLEX MICROSCOPIC - Abnormal; Notable for the following:    Leukocytes, UA TRACE (*)    All other components within normal limits  I-STAT CHEM 8, ED - Abnormal; Notable for the following:    Glucose, Bld 116 (*)    Calcium, Ion 1.08 (*)    Hemoglobin 9.5 (*)    HCT 28.0 (*)    All other components within normal  limits  CULTURE, BLOOD (ROUTINE X 2)  CULTURE, BLOOD (ROUTINE X 2)  URINE MICROSCOPIC-ADD ON  DIFFERENTIAL    Imaging Review Dg Abd Acute W/chest  01/17/2014   CLINICAL DATA:  Sudden onset of fever late last night, and diarrhea several hours ago. Generalized abdominal discomfort. Patient on chemotherapy for current history of lymphoma. Initial encounter.  EXAM: ACUTE ABDOMEN SERIES (ABDOMEN 2 VIEW & CHEST 1 VIEW)  COMPARISON:  Chest radiograph performed 01/11/2014  FINDINGS: The lungs are well-aerated. A small left pleural effusion is noted. Pulmonary vascularity is at the upper limits of normal. No pneumothorax is seen. The cardiomediastinal silhouette is within normal limits. A right-sided chest port is noted ending about the mid SVC.  The visualized bowel gas pattern is unremarkable. Scattered stool and air are seen within the colon; there is no evidence of small bowel dilatation to suggest  obstruction. No free intra-abdominal air is identified on the provided upright view.  No acute osseous abnormalities are seen; the sacroiliac joints are unremarkable in appearance.  IMPRESSION: 1. Unremarkable bowel gas pattern; no free intra-abdominal air seen. 2. Small left pleural effusion noted. Lungs otherwise grossly clear.   Electronically Signed   By: Garald Balding M.D.   On: 01/17/2014 01:26     EKG Interpretation None     No specific cause for temp. Most likely tumor fever to call oncologist in am  MDM   Final diagnoses:  Fever of unknown origin         Garald Balding, NP 01/17/14 [phone]

## 2014-01-17 ENCOUNTER — Telehealth: Payer: Self-pay | Admitting: *Deleted

## 2014-01-17 ENCOUNTER — Encounter: Payer: Self-pay | Admitting: Nurse Practitioner

## 2014-01-17 DIAGNOSIS — R509 Fever, unspecified: Secondary | ICD-10-CM | POA: Diagnosis not present

## 2014-01-17 DIAGNOSIS — D696 Thrombocytopenia, unspecified: Secondary | ICD-10-CM | POA: Insufficient documentation

## 2014-01-17 DIAGNOSIS — E8809 Other disorders of plasma-protein metabolism, not elsewhere classified: Secondary | ICD-10-CM | POA: Insufficient documentation

## 2014-01-17 DIAGNOSIS — R21 Rash and other nonspecific skin eruption: Secondary | ICD-10-CM | POA: Insufficient documentation

## 2014-01-17 DIAGNOSIS — K1231 Oral mucositis (ulcerative) due to antineoplastic therapy: Secondary | ICD-10-CM | POA: Insufficient documentation

## 2014-01-17 LAB — I-STAT CHEM 8, ED
BUN: 22 mg/dL (ref 6–23)
Calcium, Ion: 1.08 mmol/L — ABNORMAL LOW (ref 1.13–1.30)
Chloride: 98 mEq/L (ref 96–112)
Creatinine, Ser: 0.9 mg/dL (ref 0.50–1.35)
GLUCOSE: 116 mg/dL — AB (ref 70–99)
HEMATOCRIT: 28 % — AB (ref 39.0–52.0)
HEMOGLOBIN: 9.5 g/dL — AB (ref 13.0–17.0)
Potassium: 3.9 mEq/L (ref 3.7–5.3)
Sodium: 137 mEq/L (ref 137–147)
TCO2: 29 mmol/L (ref 0–100)

## 2014-01-17 LAB — DIFFERENTIAL
BASOS ABS: 0 10*3/uL (ref 0.0–0.1)
Basophils Relative: 0 % (ref 0–1)
EOS PCT: 4 % (ref 0–5)
Eosinophils Absolute: 0.2 10*3/uL (ref 0.0–0.7)
LYMPHS PCT: 21 % (ref 12–46)
Lymphs Abs: 0.8 10*3/uL (ref 0.7–4.0)
Monocytes Absolute: 0.1 10*3/uL (ref 0.1–1.0)
Monocytes Relative: 3 % (ref 3–12)
Neutro Abs: 2.7 10*3/uL (ref 1.7–7.7)
Neutrophils Relative %: 72 % (ref 43–77)

## 2014-01-17 LAB — URINALYSIS, ROUTINE W REFLEX MICROSCOPIC
Bilirubin Urine: NEGATIVE
Glucose, UA: NEGATIVE mg/dL
Hgb urine dipstick: NEGATIVE
KETONES UR: NEGATIVE mg/dL
NITRITE: NEGATIVE
PROTEIN: NEGATIVE mg/dL
Specific Gravity, Urine: 1.019 (ref 1.005–1.030)
Urobilinogen, UA: 1 mg/dL (ref 0.0–1.0)
pH: 5.5 (ref 5.0–8.0)

## 2014-01-17 LAB — CBC
HCT: 27 % — ABNORMAL LOW (ref 39.0–52.0)
Hemoglobin: 9.2 g/dL — ABNORMAL LOW (ref 13.0–17.0)
MCH: 28.8 pg (ref 26.0–34.0)
MCHC: 34.1 g/dL (ref 30.0–36.0)
MCV: 84.6 fL (ref 78.0–100.0)
PLATELETS: 102 10*3/uL — AB (ref 150–400)
RBC: 3.19 MIL/uL — ABNORMAL LOW (ref 4.22–5.81)
RDW: 14.2 % (ref 11.5–15.5)
WBC: 3.9 10*3/uL — AB (ref 4.0–10.5)

## 2014-01-17 LAB — URINE MICROSCOPIC-ADD ON

## 2014-01-17 MED ORDER — HEPARIN SOD (PORK) LOCK FLUSH 100 UNIT/ML IV SOLN
500.0000 [IU] | Freq: Once | INTRAVENOUS | Status: AC
  Administered 2014-01-17: 500 [IU]
  Filled 2014-01-17: qty 5

## 2014-01-17 MED ORDER — NAPROXEN 375 MG PO TABS: 375.0000 mg | ORAL_TABLET | Freq: Three times a day (TID) | ORAL | Status: DC

## 2014-01-17 MED ORDER — NAPROXEN 375 MG PO TABS
375.0000 mg | ORAL_TABLET | Freq: Once | ORAL | Status: AC
  Administered 2014-01-17: 375 mg via ORAL
  Filled 2014-01-17: qty 1

## 2014-01-17 NOTE — ED Provider Notes (Signed)
I personally performed the services described in this documentation, which was scribed in my presence. The recorded information has been reviewed and is accurate.  Carlisle Beers, MD 01/17/14 [phone]

## 2014-01-17 NOTE — Telephone Encounter (Signed)
Called to follow up on ER visit last night. Son reports his temp was 100.6 and they were concerned and took him in. Today he is afebrile and feeling well. Was up with his therapist today and had good session. Son asking if OK to leave him alone couple hours/day for his mom to run errand. Suggested he always have someone there at present time until he is safe to ambulate without fall risk. Informed him of of bleeding risk due to low platelets with his chemo treatment, so would be best to not be alone. Confirmed appointment for 01/19/14.

## 2014-01-17 NOTE — Assessment & Plan Note (Signed)
Albumin remains low.  Patient was encouraged to push protein is much as possible. 

## 2014-01-17 NOTE — Assessment & Plan Note (Signed)
Patient was hospitalized on September 23 through 01/12/2014 with initial diagnosis of diffuse large B-cell lymphoma.  Patient received his initial cycle of rituximab only portion on 01/07/2014 while admitted  He experienced ahypersensitivity reaction to the rituximab which was managed per hypersensitivity protocol.  He received the CHOP portion of his chemotherapy separately on 01/08/2014; and tolerated this fairly well.  Patient is scheduled to return for labs and a followup visit on 01/19/2014.  He'll be scheduled for his next chemotherapy on 01/31/2014.

## 2014-01-17 NOTE — Assessment & Plan Note (Signed)
Patient does appear to developed some mild rash to his tongue.  He denies any oral sensitivity or pain whatsoever.  After extensive discussion with both patient and his son-the patient prefers to have the nystatin only swish and spit prescribed.

## 2014-01-17 NOTE — Discharge Instructions (Signed)
Fever, Adult A fever is a temperature of 100.4 F (38 C) or above.  HOME CARE  Take fever medicine as told by your doctor. Do not  take aspirin for fever if you are younger than 78 years of age.  If you are given antibiotic medicine, take it as told. Finish the medicine even if you start to feel better.  Rest.  Drink enough fluids to keep your pee (urine) clear or pale yellow. Do not drink alcohol.  Take a bath or shower with room temperature water. Do not use ice water or alcohol sponge baths.  Wear lightweight, loose clothes. GET HELP RIGHT AWAY IF:   You are short of breath or have trouble breathing.  You are very weak.  You are dizzy or you pass out (faint).  You are very thirsty or are making little or no urine.  You have new pain.  You throw up (vomit) or have watery poop (diarrhea).  You keep throwing up or having watery poop for more than 1 to 2 days.  You have a stiff neck or light bothers your eyes.  You have a skin rash.  You have a fever or problems (symptoms) that last for more than 2 to 3 days.  You have a fever and your problems quickly get worse.  You keep throwing up the fluids you drink.  You do not feel better after 3 days.  You have new problems. MAKE SURE YOU:   Understand these instructions.  Will watch your condition.  Will get help right away if you are not doing well or get worse. Document Released: 01/09/2008 Document Revised: 06/24/2011 Document Reviewed: 01/31/2011 [facility] Patient Information 2015 [place], [place]. This information is not intended to replace advice given to you by your health care provider. Make sure you discuss any questions you have with your health care provider. You have been given a prescription for Naprosyn to control fever  This is specific for "tumor fever"  Please call your oncologist later today for further direction

## 2014-01-17 NOTE — Progress Notes (Signed)
[place]   Chief Complaint  Patient presents with  . Rash    HPI: Erik Ramirez 80 y.o. male diagnosed with diffuse large B-cell lymphoma.  Currently undergoing R.-CHOP chemotherapy regimen.  Patient was admitted to the hospital from 01/05/2014 to 01/12/2014 with initial diagnosis of lymphoma.  He received his R.-CHOP chemotherapy while admitted; with the rituximab be given on day 1 and the CHOP portion of the chemotherapy will abate.  Patient did develop a significant rituximab I reaction; and had to receive the rituximab at a very slow rate throughout the day.  Patient called today requesting urgent care visit.  He is concerned that he has a rash around his newly inserted Port-A-Cath site.  He also has developed some rash/hives to his bilateral lower extremities and up to his trunk.  He denies any shortness of breath, airway issues, or throat issues.  He denies any recent nausea, vomiting, or diarrhea.  He denies any recent fever or chills.  He arrived to the [facility] with his son today.     Rash    CURRENT THERAPY: Upcoming Treatment Dates - NON-HODGKINS LYMPHOMA CHOP q21d Days with orders from any treatment category:  01/31/2014      SCHEDULING COMMUNICATION      ondansetron (ZOFRAN) IVPB 16 mg      Dexamethasone Sodium Phosphate (DECADRON) injection 20 mg      DOXOrubicin (ADRIAMYCIN) chemo injection 50 mg      vinCRIStine (ONCOVIN) 1 mg in sodium chloride 0.9 % 50 mL chemo infusion      cyclophosphamide (CYTOXAN) 800 mg in sodium chloride 0.9 % 250 mL chemo infusion      sodium chloride 0.9 % injection 10 mL      heparin lock flush 100 unit/mL      heparin lock flush 100 unit/mL      alteplase (CATHFLO ACTIVASE) injection 2 mg      sodium chloride 0.9 % injection 3 mL      Cold Pack 1 packet      Hot Pack 1 packet      0.9 %  sodium chloride infusion      TREATMENT CONDITIONS 02/21/2014      SCHEDULING COMMUNICATION      ondansetron (ZOFRAN)  IVPB 16 mg      Dexamethasone Sodium Phosphate (DECADRON) injection 20 mg      DOXOrubicin (ADRIAMYCIN) chemo injection 50 mg      vinCRIStine (ONCOVIN) 1 mg in sodium chloride 0.9 % 50 mL chemo infusion      cyclophosphamide (CYTOXAN) 800 mg in sodium chloride 0.9 % 250 mL chemo infusion      sodium chloride 0.9 % injection 10 mL      heparin lock flush 100 unit/mL      heparin lock flush 100 unit/mL      alteplase (CATHFLO ACTIVASE) injection 2 mg      sodium chloride 0.9 % injection 3 mL      Cold Pack 1 packet      Hot Pack 1 packet      0.9 %  sodium chloride infusion      TREATMENT CONDITIONS 03/14/2014      SCHEDULING COMMUNICATION      ondansetron (ZOFRAN) IVPB 16 mg      Dexamethasone Sodium Phosphate (DECADRON) injection 20 mg      DOXOrubicin (ADRIAMYCIN) chemo injection 50 mg      vinCRIStine (ONCOVIN) 1 mg in sodium chloride 0.9 % 50  mL chemo infusion      cyclophosphamide (CYTOXAN) 800 mg in sodium chloride 0.9 % 250 mL chemo infusion      sodium chloride 0.9 % injection 10 mL      heparin lock flush 100 unit/mL      heparin lock flush 100 unit/mL      alteplase (CATHFLO ACTIVASE) injection 2 mg      sodium chloride 0.9 % injection 3 mL      Cold Pack 1 packet      Hot Pack 1 packet      0.9 %  sodium chloride infusion      TREATMENT CONDITIONS    Review of Systems  Skin: Positive for rash.    Past Medical History  Diagnosis Date  . Hypertension   . Hyperlipemia   . Anxiety   . Lymphadenopathy, abdominal   . Urethral stricture   . Anemia   . BPPV (benign paroxysmal positional vertigo)   . Lymphoma   . Diabetes mellitus without complication   . Obstructive sleep apnea     Past Surgical History  Procedure Laterality Date  . Back surgery    . Prostate surgery    . Tonsillectomy      has Hypoxia; Acute respiratory failure with hypoxia; Hypertension; Urethral stricture; Diabetes mellitus without complication; Obstructive sleep apnea; DLBCL (diffuse  large B cell lymphoma); CKD (chronic kidney disease), stage III; Atrial fibrillation with RVR; Rash; Thrombocytopenia; Hypoalbuminemia; and Mucositis due to antineoplastic therapy on his problem list.     is allergic to rituximab; caffeine; morphine and related; and penicillins.    Medication List       This list is accurate as of: 01/14/14 11:59 PM.  Always use your most recent med list.               allopurinol 300 MG tablet  Commonly known as:  ZYLOPRIM  Take 1 tablet (300 mg total) by mouth daily.     aspirin 325 MG tablet  Take 1 tablet (325 mg total) by mouth daily.     clorazepate 7.5 MG tablet  Commonly known as:  TRANXENE  Take 7.5 mg by mouth 2 (two) times daily as needed for anxiety or sleep.     clotrimazole-betamethasone cream  Commonly known as:  LOTRISONE  Apply 1 application topically 2 (two) times daily.     diltiazem 120 MG 24 hr capsule  Commonly known as:  CARDIZEM CD  Take 1 capsule (120 mg total) by mouth daily.     feeding supplement (ENSURE COMPLETE) Liqd  Take 237 mLs by mouth daily.     insulin aspart 100 UNIT/ML injection  Commonly known as:  novoLOG  Inject 0-20 Units into the skin 3 (three) times daily with meals.     metoprolol 50 MG tablet  Commonly known as:  LOPRESSOR  Take 25 mg by mouth 2 (two) times daily.     ondansetron 8 MG tablet  Commonly known as:  ZOFRAN  Take 1 tablet (8 mg total) by mouth every 12 (twelve) hours.     simvastatin 20 MG tablet  Commonly known as:  ZOCOR  Take 20 mg by mouth daily.     traMADol 50 MG tablet  Commonly known as:  ULTRAM  Take 50 mg by mouth every 6 (six) hours as needed for moderate pain.         PHYSICAL EXAMINATION  Blood pressure 154/65, pulse 63, temperature 98.1 F (36.7 C), temperature source  Oral, resp. rate 18, height 5\' 7"  (1.702 m), weight 188 lb 12.8 oz (85.639 kg), SpO2 97.00%.  Physical Exam  Nursing note and vitals reviewed. Constitutional: He is oriented to  person, place, and time and well-developed, well-nourished, and in no distress.  HENT:  Head: Normocephalic and atraumatic.  Patient does have a thin white coating to his tongue.  Known oral lesions noted.  No specific sensitivity to mouth.  Eyes: Conjunctivae and EOM are normal. Pupils are equal, round, and reactive to light. Right eye exhibits no discharge. Left eye exhibits no discharge. No scleral icterus.  Neck: Normal range of motion. Neck supple. No JVD present. No tracheal deviation present. No thyromegaly present.  Cardiovascular: Normal rate, regular rhythm, normal heart sounds and intact distal pulses.   Pulmonary/Chest: No respiratory distress. He has no wheezes.  Abdominal: Soft. Bowel sounds are normal. He exhibits no distension. There is no tenderness. There is no rebound.  Musculoskeletal: Normal range of motion. He exhibits no edema and no tenderness.  Lymphadenopathy:    He has no cervical adenopathy.  Neurological: He is alert and oriented to person, place, and time. Gait normal.  Skin: Skin is warm and dry. Rash noted. No erythema.  Patient does have some resolving hives to his bilateral lower extremities; and has trace of these hives/rash to his lower back.  No evidence of scratching or infection noted.  Also, area around patient right upper chest Port-A-Cath does appear to have tiny petechiae surrounding the edges of the previous bandage.  No evidence of hematoma or infection.  Psychiatric: Affect normal.    LABORATORY DATA:. CBC  Lab Results  Component Value Date   WBC 3.9* 01/17/2014   RBC 3.19* 01/17/2014   HGB 9.5* 01/17/2014   HCT 28.0* 01/17/2014   PLT 102* 01/17/2014   MCV 84.6 01/17/2014   MCH 28.8 01/17/2014   MCHC 34.1 01/17/2014   RDW 14.2 01/17/2014   LYMPHSABS 0.8 01/17/2014   MONOABS 0.1 01/17/2014   EOSABS 0.2 01/17/2014   BASOSABS 0.0 01/17/2014     CMET  Lab Results  Component Value Date   NA 137 01/17/2014   K 3.9 01/17/2014   CL 98 01/17/2014    CO2 29 01/14/2014   GLUCOSE 116* 01/17/2014   BUN 22 01/17/2014   CREATININE 0.90 01/17/2014   CALCIUM 8.1* 01/14/2014   PROT 4.9* 01/14/2014   ALBUMIN 2.0* 01/14/2014   AST 32 01/14/2014   ALT 28 01/14/2014   ALKPHOS 85 01/14/2014   BILITOT 0.74 01/14/2014   GFRNONAA 51* 01/12/2014   GFRAA 59* 01/12/2014    ASSESSMENT/PLAN:    DLBCL (diffuse large B cell lymphoma)  Assessment & Plan Patient was hospitalized on September 23 through 01/12/2014 with initial diagnosis of diffuse large B-cell lymphoma.  Patient received his initial cycle of rituximab only portion on 01/07/2014 while admitted  He experienced ahypersensitivity reaction to the rituximab which was managed per hypersensitivity protocol.  He received the CHOP portion of his chemotherapy separately on 01/08/2014; and tolerated this fairly well.  Patient is scheduled to return for labs and a followup visit on 01/19/2014.  He'll be scheduled for his next chemotherapy on 01/31/2014.    Rash  Assessment & Plan Patient does have some very fine high is to his bilateral lower extremities; and a trace of hives/rash to his lower back.  Patient denies any pruritus with this rash/hives whatsoever.  Patient's son states that his hives have steadily improved over the day.  This could  very well be a chemotherapy-induced rash versus a lymphoma rash.  Patient was encouraged to try Benadryl 25 mg every 6 hours and Pepcid 20 mg every 12 hours to see if this helps.  Patient was once again advised to return to the emergency department if he develops any worsening hypersensitivity reactions whatsoever.   Thrombocytopenia  Assessment & Plan Patient's platelet count remains stable at 82.  Previously his platelet count was 79.  Patient was concerned regarding a rash around his Port-A-Cath site.  The closer inspection-it does appear that there is some petechiae around the bandage site of the Port-A-Cath.  No obvious hematoma; no infection noted on  exam.   Hypoalbuminemia  Assessment & Plan Albumin remains low.  Patient was encouraged to push protein is much as possible.   Mucositis due to antineoplastic therapy  Assessment & Plan Patient does appear to developed some mild rash to his tongue.  He denies any oral sensitivity or pain whatsoever.  After extensive discussion with both patient and his son-the patient prefers to have the nystatin only swish and spit prescribed.    Patient stated understanding of all instructions; and was in agreement with this plan of care. The patient knows to call the clinic with any problems, questions or concerns.   Review/collaboration with Dr. Jana Hakim regarding all aspects of patient's visit today.   Total time spent with patient was 40 minutes;  with greater than 90 percent of that time spent in face to face counseling regarding his symptoms, extensive discussion regarding lab results, mouth care, chemotherapy plan, and coordination of care and follow up.  Disclaimer: This note was dictated with voice recognition software. Similar sounding words can inadvertently be transcribed and may not be corrected upon review.   Drue Second, NP 01/17/2014

## 2014-01-17 NOTE — ED Notes (Signed)
Patient transported to X-ray 

## 2014-01-17 NOTE — Assessment & Plan Note (Signed)
Patient's platelet count remains stable at 82.  Previously his platelet count was 79.  Patient was concerned regarding a rash around his Port-A-Cath site.  The closer inspection-it does appear that there is some petechiae around the bandage site of the Port-A-Cath.  No obvious hematoma; no infection noted on exam.

## 2014-01-17 NOTE — Assessment & Plan Note (Signed)
Patient does have some very fine high is to his bilateral lower extremities; and a trace of hives/rash to his lower back.  Patient denies any pruritus with this rash/hives whatsoever.  Patient's son states that his hives have steadily improved over the day.  This could very well be a chemotherapy-induced rash versus a lymphoma rash.  Patient was encouraged to try Benadryl 25 mg every 6 hours and Pepcid 20 mg every 12 hours to see if this helps.  Patient was once again advised to return to the emergency department if he develops any worsening hypersensitivity reactions whatsoever.

## 2014-01-19 ENCOUNTER — Ambulatory Visit (HOSPITAL_BASED_OUTPATIENT_CLINIC_OR_DEPARTMENT_OTHER): Payer: Medicare Other | Admitting: Nurse Practitioner

## 2014-01-19 ENCOUNTER — Telehealth: Payer: Self-pay | Admitting: Oncology

## 2014-01-19 ENCOUNTER — Other Ambulatory Visit (HOSPITAL_BASED_OUTPATIENT_CLINIC_OR_DEPARTMENT_OTHER): Payer: Medicare Other

## 2014-01-19 ENCOUNTER — Encounter: Payer: Self-pay | Admitting: Nurse Practitioner

## 2014-01-19 ENCOUNTER — Other Ambulatory Visit: Payer: Self-pay | Admitting: Oncology

## 2014-01-19 VITALS — BP 153/54 | HR 70 | Temp 98.5°F | Resp 18 | Ht 67.0 in | Wt 184.2 lb

## 2014-01-19 DIAGNOSIS — C8330 Diffuse large B-cell lymphoma, unspecified site: Secondary | ICD-10-CM

## 2014-01-19 DIAGNOSIS — D630 Anemia in neoplastic disease: Secondary | ICD-10-CM | POA: Insufficient documentation

## 2014-01-19 DIAGNOSIS — C8590 Non-Hodgkin lymphoma, unspecified, unspecified site: Secondary | ICD-10-CM

## 2014-01-19 DIAGNOSIS — D696 Thrombocytopenia, unspecified: Secondary | ICD-10-CM

## 2014-01-19 DIAGNOSIS — E8809 Other disorders of plasma-protein metabolism, not elsewhere classified: Secondary | ICD-10-CM

## 2014-01-19 DIAGNOSIS — C8299 Follicular lymphoma, unspecified, extranodal and solid organ sites: Secondary | ICD-10-CM

## 2014-01-19 LAB — CBC WITH DIFFERENTIAL/PLATELET
BASO%: 0.4 % (ref 0.0–2.0)
Basophils Absolute: 0 10*3/uL (ref 0.0–0.1)
EOS%: 2.4 % (ref 0.0–7.0)
Eosinophils Absolute: 0.1 10*3/uL (ref 0.0–0.5)
HEMATOCRIT: 26.8 % — AB (ref 38.4–49.9)
HGB: 9 g/dL — ABNORMAL LOW (ref 13.0–17.1)
LYMPH#: 0.7 10*3/uL — AB (ref 0.9–3.3)
LYMPH%: 26.2 % (ref 14.0–49.0)
MCH: 28.3 pg (ref 27.2–33.4)
MCHC: 33.6 g/dL (ref 32.0–36.0)
MCV: 84.3 fL (ref 79.3–98.0)
MONO#: 0.2 10*3/uL (ref 0.1–0.9)
MONO%: 7.1 % (ref 0.0–14.0)
NEUT#: 1.6 10*3/uL (ref 1.5–6.5)
NEUT%: 63.9 % (ref 39.0–75.0)
Platelets: 109 10*3/uL — ABNORMAL LOW (ref 140–400)
RBC: 3.18 10*6/uL — AB (ref 4.20–5.82)
RDW: 14.1 % (ref 11.0–14.6)
WBC: 2.5 10*3/uL — ABNORMAL LOW (ref 4.0–10.3)

## 2014-01-19 LAB — COMPREHENSIVE METABOLIC PANEL (CC13)
ALK PHOS: 73 U/L (ref 40–150)
ALT: 20 U/L (ref 0–55)
AST: 22 U/L (ref 5–34)
Albumin: 2.1 g/dL — ABNORMAL LOW (ref 3.5–5.0)
Anion Gap: 4 mEq/L (ref 3–11)
BILIRUBIN TOTAL: 0.43 mg/dL (ref 0.20–1.20)
BUN: 19.3 mg/dL (ref 7.0–26.0)
CO2: 29 mEq/L (ref 22–29)
Calcium: 7.9 mg/dL — ABNORMAL LOW (ref 8.4–10.4)
Chloride: 103 mEq/L (ref 98–109)
Creatinine: 0.8 mg/dL (ref 0.7–1.3)
GLUCOSE: 135 mg/dL (ref 70–140)
Potassium: 4.1 mEq/L (ref 3.5–5.1)
Sodium: 136 mEq/L (ref 136–145)
Total Protein: 5 g/dL — ABNORMAL LOW (ref 6.4–8.3)

## 2014-01-19 LAB — URIC ACID (CC13): URIC ACID, SERUM: 2.4 mg/dL — AB (ref 2.6–7.4)

## 2014-01-19 LAB — LACTATE DEHYDROGENASE (CC13): LDH: 206 U/L (ref 125–245)

## 2014-01-19 NOTE — Telephone Encounter (Signed)
per pof to sch pt appt-sch & sent MW email to sch pt trmt-pt req to mail copy of sch

## 2014-01-19 NOTE — Progress Notes (Signed)
ID: Erik Ramirez OB: [birth year]  Erik#: [id number]  CSN#:[id number]  PCP: [name], MD GYN:   SU:  OTHER MD:  CHIEF COMPLAINT: large B-cell lymphoma CURRENT THERAPY: "mini-CHOP" and rituxan  HISTORY OF PRESENT ILLNESS: Erik Ramirez developed abdominal pain and distension sometime mid August 2015. He thought this was diverticular disease. Eventually he brought these concerns to Dr [name] and per patient's report and chart review a CT of the abd/pelvis was obtained which showed significant adenopathy. Biopsy 12/13/2013 at [facility] reportedly showed a large cell, B-cell lymphoma. The patient has developed "B" symptoms including unexplained fatigue, fevers and drenching sweats. He presented to [facility] 01/05/2014 with worsening shortness of breath. Workup here included a chest CT 01/05/2014 which showed diffuse upper abdominal and mediastinal adenopathy, with bilateral pleural effusions. V/Q scan was low probability for PE and bilateral LE dopples were negative for DVT. Echo 01/06/2014 showed an EF or 55-60%. He developed A-fib 01/06/2014 and has been started on cardizem. Other problems include acute renal injury, DM, HTN, dyslipidemia with atherosclerosis noted on CT scans and OSA requiring norcturnal C-pap.  INTERVAL HISTORY:  Erik Ramirez returns to clinic for follow up of his newly diagnosed b-cell lymphoma, accompanied by his son, Erik Ramirez. He was discharged from the hospital on 01/12/14, but then had to make a visit to the emergency department on 10/4 because of a fever. On 10/5, he visited our symptom management clinic with a rash around his port site and oral thrush. The rash is resolving, as well as the thrush now that he is using nystatin rinses.   REVIEW OF SYSTEMS: Today Erik Ramirez is afebrile, he denies chills. He was nauseous only once after treatment, and is chronically constipated. He is only using miralax, and I suggested he add stool softeners to his daily regimen. His appetite has  decreased, but he is taking in boost protein supplements daily. He is short of breath with exhaustion, and is working with physical therapy to regain his strength. He denies chest pain, palpitation, or cough. A detailed review of systems is otherwise noncontributory.   PAST MEDICAL HISTORY: Past Medical History  Diagnosis Date  . Hypertension   . Hyperlipemia   . Anxiety   . Lymphadenopathy, abdominal   . Urethral stricture   . Anemia   . BPPV (benign paroxysmal positional vertigo)   . Lymphoma   . Diabetes mellitus without complication   . Obstructive sleep apnea     PAST SURGICAL HISTORY: Past Surgical History  Procedure Laterality Date  . Back surgery    . Prostate surgery    . Tonsillectomy      FAMILY HISTORY History reviewed. No pertinent family history.  Patient's father had a "muscle disease" and died from unclear causes age 21. Patient's mother died from heartproblems age 78. The patient had 1 brother, 5 sisters. 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 He and his wife Erik Ramirez have ben married 84 years. Son Erik Ramirez lives in [place] and works in Press photographer. Son Erik Ramirez lives in [place] and is retired (disabled). Son Erik Ramirez runs a maintenance shop in [place], where he lives. Son Erik Ramirez works in telephones in [place]. Erik Ramirez's wife, Erik Ramirez, is an Therapist, sports and worked for [employer] many years. She now works for [employer].The patient has 8 gch and 6 ggch. He is a Psychologist, forensic    ADVANCED DIRECTIVES: in place. Erik Truett tells me "if they can't  do anything for me" he would not want resuscitation or life support. His wife Erik Ramirez is his health care POA, with son Erik Ramirez listed second.    HEALTH MAINTENANCE: History  Substance Use Topics  . Smoking status: Never Smoker   . Smokeless tobacco: Not on file  . Alcohol Use: No     Allergies  Allergen Reactions  . Rituximab Other (See Comments)     Developed wheezing & rigors when infusion rate increased to 100 mg/hr.  Tolerates rate of 50 mg/hr.  . Caffeine Other (See Comments)    Makes patient feel like he's going to die  . Morphine And Related Other (See Comments)    excitability  . Penicillins Rash    Current Outpatient Prescriptions  Medication Sig Dispense Refill  . allopurinol (ZYLOPRIM) 300 MG tablet Take 1 tablet (300 mg total) by mouth daily.  30 tablet  1  . aspirin 325 MG tablet Take 1 tablet (325 mg total) by mouth daily.  30 tablet  0  . clorazepate (TRANXENE) 7.5 MG tablet Take 7.5 mg by mouth 2 (two) times daily as needed for anxiety or sleep.      . clotrimazole-betamethasone (LOTRISONE) cream Apply 1 application topically 2 (two) times daily.      Marland Kitchen diltiazem (CARDIZEM CD) 120 MG 24 hr capsule Take 1 capsule (120 mg total) by mouth daily.  30 capsule  1  . feeding supplement, ENSURE COMPLETE, (ENSURE COMPLETE) LIQD Take 237 mLs by mouth daily.  20 Bottle  1  . insulin aspart (NOVOLOG) 100 UNIT/ML injection Inject 0-20 Units into the skin 3 (three) times daily with meals.  10 mL  11  . metoprolol (LOPRESSOR) 50 MG tablet Take 25 mg by mouth 2 (two) times daily.        . naproxen (NAPROSYN) 375 MG tablet Take 1 tablet (375 mg total) by mouth 3 (three) times daily with meals.  20 tablet  0  . nystatin (MYCOSTATIN) 100000 UNIT/ML suspension       . ondansetron (ZOFRAN) 8 MG tablet Take 1 tablet (8 mg total) by mouth every 12 (twelve) hours.  20 tablet  0  . simvastatin (ZOCOR) 20 MG tablet Take 20 mg by mouth daily.      . traMADol (ULTRAM) 50 MG tablet Take 50 mg by mouth every 6 (six) hours as needed for moderate pain.      Marland Kitchen ondansetron (ZOFRAN) 8 MG tablet Take 8 mg by mouth 2 (two) times daily as needed for nausea or vomiting.       No current facility-administered medications for this visit.    OBJECTIVE: Filed Vitals:   01/19/14 1513  BP: 153/54  Pulse: 70  Temp: 98.5 F (36.9 C)  Resp: 18     Body  mass index is 28.84 kg/(m^2).   ROS   Physical Exam  ECOG FS:1 - Symptomatic but completely ambulatory  PHYSICAL EXAM: White male who appears stated age  Skin: warm, dry, diffuse bruising on abdomen and bilateral forearms HEENT: sclerae anicteric, conjunctivae pink, oropharynx clear. No thrush or mucositis.  Lymph Nodes: No cervical or supraclavicular lymphadenopathy  Lungs: clear to auscultation bilaterally, no rales, wheezes, or rhonci  Heart: regular rate and rhythm  Abdomen: round, soft, non tender, positive bowel sounds  Musculoskeletal: No focal spinal tenderness, no peripheral edema, bilateral lower extremity weakness, but ambulated on own Neuro: non focal, well oriented, positive affect     LAB RESULTS:   Chemistry  Component Value Date/Time   NA 136 01/19/2014 1457   NA 137 01/17/2014 0021   K 4.1 01/19/2014 1457   K 3.9 01/17/2014 0021   CL 98 01/17/2014 0021   CO2 29 01/19/2014 1457   CO2 24 01/12/2014 0418   BUN 19.3 01/19/2014 1457   BUN 22 01/17/2014 0021   CREATININE 0.8 01/19/2014 1457   CREATININE 0.90 01/17/2014 0021      Component Value Date/Time   CALCIUM 7.9* 01/19/2014 1457   CALCIUM 7.9* 01/12/2014 0418   ALKPHOS 73 01/19/2014 1457   ALKPHOS 100 01/12/2014 0418   AST 22 01/19/2014 1457   AST 29 01/12/2014 0418   ALT 20 01/19/2014 1457   ALT 31 01/12/2014 0418   BILITOT 0.43 01/19/2014 1457   BILITOT 0.6 01/12/2014 0418     Lab Results  Component Value Date   WBC 2.5* 01/19/2014   HGB 9.0* 01/19/2014   HCT 26.8* 01/19/2014   MCV 84.3 01/19/2014   PLT 109* 01/19/2014    Urinalysis    Component Value Date/Time   COLORURINE YELLOW 01/17/2014 0029   APPEARANCEUR CLEAR 01/17/2014 0029   LABSPEC 1.019 01/17/2014 0029   PHURINE 5.5 01/17/2014 0029   GLUCOSEU NEGATIVE 01/17/2014 0029   HGBUR NEGATIVE 01/17/2014 0029   BILIRUBINUR NEGATIVE 01/17/2014 0029   KETONESUR NEGATIVE 01/17/2014 0029   PROTEINUR NEGATIVE 01/17/2014 0029   UROBILINOGEN 1.0 01/17/2014 0029    NITRITE NEGATIVE 01/17/2014 0029   LEUKOCYTESUR TRACE* 01/17/2014 0029    STUDIES: Dg Chest 1 View  01/08/2014   CLINICAL DATA:  Post thoracentesis.  EXAM: CHEST - 1 VIEW  COMPARISON:  01/08/2014  FINDINGS: Improved aeration at the right lung base consistent with a recent thoracentesis. There is decreased right pleural fluid. Persistent densities at the left lung base suggest pleural fluid and atelectasis. Heart size is grossly stable. The trachea is midline.  IMPRESSION: Negative for a pneumothorax following the right thoracentesis. Decreased right pleural effusion.  Left basilar densities are compatible with pleural fluid.   Electronically Signed   By: Markus Daft M.D.   On: 01/08/2014 13:48   Dg Chest 2 View  01/11/2014   CLINICAL DATA:  Shortness of breath.  Follow-up effusions.  EXAM: CHEST  2 VIEW  COMPARISON:  01/09/2014  FINDINGS: Heart is upper limits normal in size. Bilateral pleural effusions noted, left greater than right, similar to prior study. Bibasilar atelectasis. No acute bony abnormality.  IMPRESSION: Stable bilateral effusions, left slightly greater than right. Bibasilar atelectasis.   Electronically Signed   By: Rolm Baptise M.D.   On: 01/11/2014 12:04   Dg Chest 2 View  01/09/2014   CLINICAL DATA:  Pleural effusion.  EXAM: CHEST  2 VIEW  COMPARISON:  January 08, 2014.  FINDINGS: Stable cardiomediastinal silhouette. No pneumothorax is noted. Minimal right pleural effusion is noted. Otherwise right lung is clear. Stable mild left pleural effusion is noted with possible underlying atelectasis.  IMPRESSION: Stable bilateral pleural effusions with left greater than right.   Electronically Signed   By: Sabino Dick M.D.   On: 01/09/2014 15:46   Dg Chest 2 View  01/08/2014   CLINICAL DATA:  Followup effusion. Cough, shortness of breath and weakness.  EXAM: CHEST  2 VIEW  COMPARISON:  01/05/2014 as well as chest CT 01/05/2014  FINDINGS: Lungs are hypoinflated with persistent hazy right  basilar opacification compatible with known small effusion and atelectasis. Slight worsening opacification the left base compatible known moderate effusions/atelectasis. Cannot completely exclude  infection in the lung bases. Prominence of the right hilar region compatible with known adenopathy in this patient with a history of lymphoma. Cardiomediastinal silhouette and remainder the exam is unchanged.  IMPRESSION: Stable right base opacification compatible with known small effusion and associated atelectasis. Slight worsening left base opacification compatible with moderate left effusion and atelectasis. Cannot exclude infection in the lung bases.  Prominent right hilum compatible is known adenopathy in this patient with history of lymphoma.   Electronically Signed   By: Marin Olp M.D.   On: 01/08/2014 10:18   Dg Chest 2 View  01/05/2014   CLINICAL DATA:  Shortness of breath.  EXAM: CHEST  2 VIEW  COMPARISON:  None available.  FINDINGS: The cardiac silhouette, mediastinal and hilar contours are somewhat prominent. The upper mediastinum is slightly widened and the right pulmonary hila is prominent. There are small bilateral pleural effusions and overlying atelectasis. No edema or pneumothorax. The bony thorax is grossly intact.  IMPRESSION: Prominent superior mediastinal contours and prominent right hilum. Chest CT may be helpful for further evaluation.  Bilateral pleural effusions and bibasilar atelectasis.   Electronically Signed   By: Kalman Jewels M.D.   On: 01/05/2014 22:32   Ct Chest Wo Contrast  01/05/2014   CLINICAL DATA:  Chest pain.  Recent diagnosis of lymphoma.  EXAM: CT CHEST WITHOUT CONTRAST  TECHNIQUE: Multidetector CT imaging of the chest was performed following the standard protocol without IV contrast.  COMPARISON:  None.  FINDINGS: THORACIC INLET/BODY WALL:  Adenopathy at the thoracic inlet, with a node posterior to the left IJ measuring 15 mm short axis. Bilateral axillary  lymphadenopathy, with a solid node on the left measuring 22 mm in short axis on image 18.  MEDIASTINUM:  No cardiomegaly or pericardial effusion. Diffuse mediastinal lymphadenopathy, present in all stations. Index node in the sub- carina region measures 28 mm short axis, larger than on abdominal imaging. Index left anterior juxtadiaphragmatic node is also larger at 14 mm (previously 10 mm). Bilateral hilar lymphadenopathy. Negative esophagus. Diffuse atherosclerosis, including the coronary arteries. No evidence of acute vascular finding.  LUNG WINDOWS:  Small, layering water density pleural effusions bilaterally. There is atelectasis in both lower lobes.  7 mm nodule in the right middle lobe. This will be re-assesed at surveillance imaging.  Spiculated density at the right apex favors pleural/parenchymal scarring.  No evidence of edema, pneumothorax, or pneumonia.  UPPER ABDOMEN:  Diffuse adenopathy in the upper abdominal ligaments. No definitive progression since recent abdominal CT. The spleen is partially imaged but clearly enlarged.  OSSEOUS:  No acute fracture.  No suspicious lytic or blastic lesions.  IMPRESSION: 1. Diffuse lymphoma, with intrathoracic progression since 12/13/2013. 2. Small bilateral pleural effusions with lower lobe atelectasis.   Electronically Signed   By: [name] M.D.   On: 01/05/2014 23:03   Nm Pulmonary Perf And Vent  01/06/2014   CLINICAL DATA:  Shortness of breath question pulmonary embolism, history hypertension, diabetes, lymphoma  EXAM: NUCLEAR MEDICINE VENTILATION - PERFUSION LUNG SCAN  TECHNIQUE: Ventilation images were obtained in multiple projections using inhaled aerosol technetium 99 M DTPA. Perfusion images were obtained in multiple projections after intravenous injection of Tc-7mMAA.  RADIOPHARMACEUTICALS:  41.0 mCi Tc-959mTPA aerosol inhalation and 5.0 mCi Tc-993mA IV  COMPARISON:  None; correlation chest radiograph and CT chest exams of 01/05/2014   FINDINGS: Ventilation: Mild central airway deposition of tracer. Minimal peripheral regularity of peripheral ventilation in both lungs.  Perfusion: Irregular areas  of nonsegmental diminished perfusion at the lateral and posterolateral RIGHT lung and at the posterior LEFT lung, corresponding to pleural effusions identified on chest CT. Small subsegmental perfusion defect RIGHT lower lobe. No additional segmental or subsegmental perfusion defects identified.  Chest CT demonstrates small to moderate-sized pleural effusions bilaterally larger on RIGHT with compressive atelectasis of BILATERAL lower lobes also greater on RIGHT.  IMPRESSION: BILATERAL pleural effusions causing compression of the lungs bilaterally.  Single subsegmental perfusion defect RIGHT lower lobe.  Findings represent a low probability for pulmonary embolism.   Electronically Signed   By: Lavonia Dana M.D.   On: 01/06/2014 12:19   Ir Fluoro Guide Cv Line Right  01/12/2014   INDICATION: History lymphoma. In need of intravenous access for chemotherapy administration  EXAM: IMPLANTED PORT A CATH PLACEMENT WITH ULTRASOUND AND FLUOROSCOPIC GUIDANCE  COMPARISON:  None.  MEDICATIONS: Vancomycin 1 gm IV; The antibiotic was administered within an appropriate time interval prior to skin puncture.  ANESTHESIA/SEDATION: Versed 1 mg IV; Fentanyl 50 mcg IV;  Total Moderate Sedation Time  30  minutes.  CONTRAST:  None  FLUOROSCOPY TIME:  24 seconds.  COMPLICATIONS: None immediate  PROCEDURE: The procedure, risks, benefits, and alternatives were explained to the patient. Questions regarding the procedure were encouraged and answered. The patient understands and consents to the procedure.  The right neck and chest were prepped with chlorhexidine in a sterile fashion, and a sterile drape was applied covering the operative field. Maximum barrier sterile technique with sterile gowns and gloves were used for the procedure. A timeout was performed prior to the initiation  of the procedure. Local anesthesia was provided with 1% lidocaine with epinephrine.  After creating a small venotomy incision, a micropuncture kit was utilized to access the internal jugular vein under direct, real-time ultrasound guidance. Ultrasound image documentation was performed. The microwire was kinked to measure appropriate catheter length.  A subcutaneous port pocket was then created along the upper chest wall utilizing a combination of sharp and blunt dissection. The pocket was irrigated with sterile saline. A single lumen ISP power injectable port was chosen for placement. The 8 Fr catheter was tunneled from the port pocket site to the venotomy incision. The port was placed in the pocket. The external catheter was trimmed to appropriate length. At the venotomy, an 8 Fr peel-away sheath was placed over a guidewire under fluoroscopic guidance. The catheter was then placed through the sheath and the sheath was removed. Final catheter positioning was confirmed and documented with a fluoroscopic spot radiograph. The port was accessed with a Huber needle, aspirated and flushed with heparinized saline.  The venotomy site was closed with an interrupted 4-0 Vicryl suture. The port pocket incision was closed with interrupted 2-0 Vicryl suture and the skin was opposed with a running subcuticular 4-0 Vicryl suture. Dermabond and Steri-strips were applied to both incisions. Dressings were placed. The patient tolerated the procedure well without immediate post procedural complication.  FINDINGS: After catheter placement, the tip lies within the superior cavoatrial junction. The catheter aspirates and flushes normally and is ready for immediate use.  IMPRESSION: Successful placement of a right internal jugular approach power injectable Port-A-Cath. The catheter is ready for immediate use.   Electronically Signed   By: Sandi Mariscal M.D.   On: 01/12/2014 09:49   Ir US Guide Vasc Access Right  01/12/2014   INDICATION:  History lymphoma. In need of intravenous access for chemotherapy administration  EXAM: IMPLANTED PORT A CATH PLACEMENT WITH ULTRASOUND AND  FLUOROSCOPIC GUIDANCE  COMPARISON:  None.  MEDICATIONS: Vancomycin 1 gm IV; The antibiotic was administered within an appropriate time interval prior to skin puncture.  ANESTHESIA/SEDATION: Versed 1 mg IV; Fentanyl 50 mcg IV;  Total Moderate Sedation Time  30  minutes.  CONTRAST:  None  FLUOROSCOPY TIME:  24 seconds.  COMPLICATIONS: None immediate  PROCEDURE: The procedure, risks, benefits, and alternatives were explained to the patient. Questions regarding the procedure were encouraged and answered. The patient understands and consents to the procedure.  The right neck and chest were prepped with chlorhexidine in a sterile fashion, and a sterile drape was applied covering the operative field. Maximum barrier sterile technique with sterile gowns and gloves were used for the procedure. A timeout was performed prior to the initiation of the procedure. Local anesthesia was provided with 1% lidocaine with epinephrine.  After creating a small venotomy incision, a micropuncture kit was utilized to access the internal jugular vein under direct, real-time ultrasound guidance. Ultrasound image documentation was performed. The microwire was kinked to measure appropriate catheter length.  A subcutaneous port pocket was then created along the upper chest wall utilizing a combination of sharp and blunt dissection. The pocket was irrigated with sterile saline. A single lumen ISP power injectable port was chosen for placement. The 8 Fr catheter was tunneled from the port pocket site to the venotomy incision. The port was placed in the pocket. The external catheter was trimmed to appropriate length. At the venotomy, an 8 Fr peel-away sheath was placed over a guidewire under fluoroscopic guidance. The catheter was then placed through the sheath and the sheath was removed. Final catheter  positioning was confirmed and documented with a fluoroscopic spot radiograph. The port was accessed with a Huber needle, aspirated and flushed with heparinized saline.  The venotomy site was closed with an interrupted 4-0 Vicryl suture. The port pocket incision was closed with interrupted 2-0 Vicryl suture and the skin was opposed with a running subcuticular 4-0 Vicryl suture. Dermabond and Steri-strips were applied to both incisions. Dressings were placed. The patient tolerated the procedure well without immediate post procedural complication.  FINDINGS: After catheter placement, the tip lies within the superior cavoatrial junction. The catheter aspirates and flushes normally and is ready for immediate use.  IMPRESSION: Successful placement of a right internal jugular approach power injectable Port-A-Cath. The catheter is ready for immediate use.   Electronically Signed   By: Sandi Mariscal M.D.   On: 01/12/2014 09:49   Dg Abd Acute W/chest  01/17/2014   CLINICAL DATA:  Sudden onset of fever late last night, and diarrhea several hours ago. Generalized abdominal discomfort. Patient on chemotherapy for current history of lymphoma. Initial encounter.  EXAM: ACUTE ABDOMEN SERIES (ABDOMEN 2 VIEW & CHEST 1 VIEW)  COMPARISON:  Chest radiograph performed 01/11/2014  FINDINGS: The lungs are well-aerated. A small left pleural effusion is noted. Pulmonary vascularity is at the upper limits of normal. No pneumothorax is seen. The cardiomediastinal silhouette is within normal limits. A right-sided chest port is noted ending about the mid SVC.  The visualized bowel gas pattern is unremarkable. Scattered stool and air are seen within the colon; there is no evidence of small bowel dilatation to suggest obstruction. No free intra-abdominal air is identified on the provided upright view.  No acute osseous abnormalities are seen; the sacroiliac joints are unremarkable in appearance.  IMPRESSION: 1. Unremarkable bowel gas pattern; no  free intra-abdominal air seen. 2. Small left pleural effusion noted. Lungs otherwise  grossly clear.   Electronically Signed   By: Garald Balding M.D.   On: 01/17/2014 01:26   US Thoracentesis Asp Pleural Space W/img Guide  01/08/2014   CLINICAL DATA:  Shortness of breath, bilateral pleural effusions. History of lymphoma. Request diagnostic and therapeutic thoracentesis.  EXAM: ULTRASOUND GUIDED RIGHT THORACENTESIS  COMPARISON:  None  PROCEDURE: An ultrasound guided thoracentesis was thoroughly discussed with the patient and questions answered. The benefits, risks, alternatives and complications were also discussed. The patient understands and wishes to proceed with the procedure. Written consent was obtained.  Ultrasound of the chest finds small to moderate size left pleural effusion and a moderate to large right pleural effusion. Right side is chosen for thoracentesis.  Ultrasound was performed to localize and mark an adequate pocket of fluid in the right chest. The area was then prepped and draped in the normal sterile fashion. 1% Lidocaine was used for local anesthesia. Under ultrasound guidance a 19 gauge Yueh catheter was introduced. Thoracentesis was performed. The catheter was removed and a dressing applied.  Complications:  None immediate  FINDINGS: A total of approximately 1 L of bloody pleural fluid was removed. A fluid sample wassent for laboratory analysis.  IMPRESSION: Successful ultrasound guided right thoracentesis yielding 1 L of pleural fluid.  Read by: Ascencion Dike PA-C   Electronically Signed   By: Daryll Brod M.D.   On: 01/08/2014 12:59    ASSESSMENT: 80 y.o.. [name] with a new diagnosis of diffuse large-cell, B-cell non-Hodgkin's lymphoma   (1) rituximab started 01/07/2014, with initial infusion reaction but able to receive entire dose that evening; to be repeated every 21 days  (2) dose-reduced CHOP started 01/10/2014--to be repeated every 21 days   (3) tumor lysis syndrome  while hospitalized: very favorable initial reaction to treatment; LDH, uric acid normalizing  (4) AKI while hospitalized: creatinine has normalized   PLAN:  Obinna is gaining some of his strength back. The labs were reviewed in detail. His LDH and uric acid continue to normalize. His hgb is down to 9, but he is asymptomatic at this time. He has diffuse bruising, but his platelet count is rising.   We spent some time going over his diet, and he will push proteins to get his albumin level back up. I talked to him about nausea and constipation medications available to him and he understands   Roshawn will return in 2 weeks for the start of his next round of chemo. He will have dose-reduced CHOP on 10/19, with rituximab the follow day. On day 3 he will receive neulasta. Tasheem will be followed up the very next week  to ascertain his performance with these drugs, and we will continue the treatments every 3 weeks as tolerated. Triton and his son understand and agree with this plan. They have been encouraged to call with any issues that might arise before their next visit here.    Marcelino Duster, NP  01/19/2014 5:47 PM

## 2014-01-20 ENCOUNTER — Other Ambulatory Visit: Payer: Self-pay | Admitting: Oncology

## 2014-01-20 ENCOUNTER — Telehealth: Payer: Self-pay | Admitting: Oncology

## 2014-01-20 ENCOUNTER — Telehealth: Payer: Self-pay | Admitting: *Deleted

## 2014-01-20 LAB — HEPATITIS B CORE ANTIBODY, IGM: Hep B C IgM: NONREACTIVE

## 2014-01-20 LAB — HEPATITIS B SURFACE ANTIGEN: HEP B S AG: NEGATIVE

## 2014-01-20 NOTE — Telephone Encounter (Signed)
per pof to sch pt appt-per req mailed copy of sch

## 2014-01-20 NOTE — Telephone Encounter (Signed)
Per staff message and POF I have scheduled appts. Advised scheduler of appts. JMW  

## 2014-01-23 LAB — CULTURE, BLOOD (ROUTINE X 2)
CULTURE: NO GROWTH
Culture: NO GROWTH

## 2014-01-24 ENCOUNTER — Telehealth: Payer: Self-pay | Admitting: *Deleted

## 2014-01-24 ENCOUNTER — Other Ambulatory Visit: Payer: Self-pay | Admitting: *Deleted

## 2014-01-24 MED ORDER — CIPROFLOXACIN HCL 500 MG PO TABS
500.0000 mg | ORAL_TABLET | Freq: Two times a day (BID) | ORAL | Status: DC
Start: 2014-01-24 — End: 2014-01-25

## 2014-01-24 NOTE — Telephone Encounter (Signed)
NO ENTRY 

## 2014-01-24 NOTE — Telephone Encounter (Signed)
Call received from pt's son- Erik Ramirez stating per his visit this am- pt is complaining of " starting of a cold ".  Erik Ramirez is having sinus drainage- clear or yellow. Cough Mild sore throat Temperature ranging from 98.6 to 100.6.  Next scheduled appointment is 10/19.  This RN discussed use of OTC cold meds as pt's usual and need to push liquids.  This note will be given to MD for review for any additional recommendations.

## 2014-01-25 ENCOUNTER — Other Ambulatory Visit: Payer: Self-pay | Admitting: *Deleted

## 2014-01-25 MED ORDER — CIPROFLOXACIN HCL 500 MG PO TABS
500.0000 mg | ORAL_TABLET | Freq: Two times a day (BID) | ORAL | Status: DC
Start: 2014-01-25 — End: 2014-04-18

## 2014-01-27 ENCOUNTER — Other Ambulatory Visit: Payer: Self-pay | Admitting: *Deleted

## 2014-01-27 MED ORDER — LIDOCAINE-PRILOCAINE 2.5-2.5 % EX CREA: 1.0000 "application " | TOPICAL_CREAM | CUTANEOUS | Status: DC | PRN

## 2014-01-28 ENCOUNTER — Other Ambulatory Visit: Payer: Self-pay | Admitting: *Deleted

## 2014-01-28 DIAGNOSIS — C8330 Diffuse large B-cell lymphoma, unspecified site: Secondary | ICD-10-CM

## 2014-01-31 ENCOUNTER — Ambulatory Visit: Payer: No Typology Code available for payment source | Admitting: Nurse Practitioner

## 2014-01-31 ENCOUNTER — Other Ambulatory Visit: Payer: Self-pay | Admitting: *Deleted

## 2014-01-31 ENCOUNTER — Ambulatory Visit (HOSPITAL_BASED_OUTPATIENT_CLINIC_OR_DEPARTMENT_OTHER): Payer: Medicare Other

## 2014-01-31 ENCOUNTER — Ambulatory Visit (HOSPITAL_BASED_OUTPATIENT_CLINIC_OR_DEPARTMENT_OTHER): Payer: Medicare Other | Admitting: Adult Health

## 2014-01-31 ENCOUNTER — Other Ambulatory Visit (HOSPITAL_BASED_OUTPATIENT_CLINIC_OR_DEPARTMENT_OTHER): Payer: Medicare Other

## 2014-01-31 ENCOUNTER — Encounter: Payer: Self-pay | Admitting: Adult Health

## 2014-01-31 ENCOUNTER — Other Ambulatory Visit: Payer: No Typology Code available for payment source

## 2014-01-31 ENCOUNTER — Other Ambulatory Visit: Payer: Self-pay | Admitting: Adult Health

## 2014-01-31 VITALS — BP 134/64 | HR 71 | Temp 97.4°F | Resp 16

## 2014-01-31 VITALS — BP 135/64 | HR 87 | Temp 98.6°F | Resp 18 | Ht 67.0 in | Wt 173.7 lb

## 2014-01-31 DIAGNOSIS — I4891 Unspecified atrial fibrillation: Secondary | ICD-10-CM

## 2014-01-31 DIAGNOSIS — C8330 Diffuse large B-cell lymphoma, unspecified site: Secondary | ICD-10-CM

## 2014-01-31 DIAGNOSIS — Z5112 Encounter for antineoplastic immunotherapy: Secondary | ICD-10-CM | POA: Diagnosis not present

## 2014-01-31 DIAGNOSIS — D630 Anemia in neoplastic disease: Secondary | ICD-10-CM

## 2014-01-31 DIAGNOSIS — C8590 Non-Hodgkin lymphoma, unspecified, unspecified site: Secondary | ICD-10-CM

## 2014-01-31 DIAGNOSIS — Z5111 Encounter for antineoplastic chemotherapy: Secondary | ICD-10-CM

## 2014-01-31 LAB — COMPREHENSIVE METABOLIC PANEL (CC13)
ALK PHOS: 64 U/L (ref 40–150)
ALT: 18 U/L (ref 0–55)
ANION GAP: 8 meq/L (ref 3–11)
AST: 22 U/L (ref 5–34)
Albumin: 2.4 g/dL — ABNORMAL LOW (ref 3.5–5.0)
BILIRUBIN TOTAL: 0.5 mg/dL (ref 0.20–1.20)
BUN: 21.6 mg/dL (ref 7.0–26.0)
CO2: 27 meq/L (ref 22–29)
CREATININE: 1.1 mg/dL (ref 0.7–1.3)
Calcium: 8.8 mg/dL (ref 8.4–10.4)
Chloride: 104 mEq/L (ref 98–109)
GLUCOSE: 115 mg/dL (ref 70–140)
Potassium: 4 mEq/L (ref 3.5–5.1)
Sodium: 138 mEq/L (ref 136–145)
Total Protein: 5.8 g/dL — ABNORMAL LOW (ref 6.4–8.3)

## 2014-01-31 LAB — CBC WITH DIFFERENTIAL/PLATELET
BASO%: 0.5 % (ref 0.0–2.0)
BASOS ABS: 0 10*3/uL (ref 0.0–0.1)
EOS ABS: 0.1 10*3/uL (ref 0.0–0.5)
EOS%: 1 % (ref 0.0–7.0)
HEMATOCRIT: 32.9 % — AB (ref 38.4–49.9)
HEMOGLOBIN: 10.7 g/dL — AB (ref 13.0–17.1)
LYMPH%: 26.2 % (ref 14.0–49.0)
MCH: 27.5 pg (ref 27.2–33.4)
MCHC: 32.4 g/dL (ref 32.0–36.0)
MCV: 85 fL (ref 79.3–98.0)
MONO#: 1.3 10*3/uL — AB (ref 0.1–0.9)
MONO%: 19.2 % — AB (ref 0.0–14.0)
NEUT%: 53.1 % (ref 39.0–75.0)
NEUTROS ABS: 3.6 10*3/uL (ref 1.5–6.5)
PLATELETS: 255 10*3/uL (ref 140–400)
RBC: 3.87 10*6/uL — ABNORMAL LOW (ref 4.20–5.82)
RDW: 14.4 % (ref 11.0–14.6)
WBC: 6.8 10*3/uL (ref 4.0–10.3)
lymph#: 1.8 10*3/uL (ref 0.9–3.3)

## 2014-01-31 LAB — LACTATE DEHYDROGENASE (CC13): LDH: 212 U/L (ref 125–245)

## 2014-01-31 LAB — URIC ACID (CC13): URIC ACID, SERUM: 3.5 mg/dL (ref 2.6–7.4)

## 2014-01-31 MED ORDER — SODIUM CHLORIDE 0.9 % IJ SOLN
10.0000 mL | INTRAMUSCULAR | Status: DC | PRN
  Administered 2014-01-31: 10 mL
  Filled 2014-01-31: qty 10

## 2014-01-31 MED ORDER — ALLOPURINOL 300 MG PO TABS: 300.0000 mg | ORAL_TABLET | Freq: Every day | ORAL | Status: DC

## 2014-01-31 MED ORDER — DOXORUBICIN HCL CHEMO IV INJECTION 2 MG/ML
25.0000 mg/m2 | Freq: Once | INTRAVENOUS | Status: AC
Start: 2014-01-31 — End: 2014-01-31
  Administered 2014-01-31: 50 mg via INTRAVENOUS
  Filled 2014-01-31: qty 25

## 2014-01-31 MED ORDER — DEXAMETHASONE SODIUM PHOSPHATE 20 MG/5ML IJ SOLN
INTRAMUSCULAR | Status: AC
  Filled 2014-01-31: qty 5

## 2014-01-31 MED ORDER — DIPHENHYDRAMINE HCL 25 MG PO CAPS
25.0000 mg | ORAL_CAPSULE | Freq: Once | ORAL | Status: AC
  Administered 2014-01-31: 25 mg via ORAL

## 2014-01-31 MED ORDER — SODIUM CHLORIDE 0.9 % IV SOLN
400.0000 mg/m2 | Freq: Once | INTRAVENOUS | Status: AC
  Administered 2014-01-31: 800 mg via INTRAVENOUS
  Filled 2014-01-31: qty 40

## 2014-01-31 MED ORDER — SODIUM CHLORIDE 0.9 % IV SOLN: 100.0000 mg | Freq: Once | INTRAVENOUS | Status: DC

## 2014-01-31 MED ORDER — DILTIAZEM HCL ER COATED BEADS 120 MG PO CP24: 120.0000 mg | ORAL_CAPSULE | Freq: Every day | ORAL | Status: DC

## 2014-01-31 MED ORDER — SODIUM CHLORIDE 0.9 % IV SOLN
Freq: Once | INTRAVENOUS | Status: AC
  Administered 2014-01-31: 10:00:00 via INTRAVENOUS

## 2014-01-31 MED ORDER — FAMOTIDINE IN NACL 20-0.9 MG/50ML-% IV SOLN
20.0000 mg | Freq: Two times a day (BID) | INTRAVENOUS | Status: DC
  Administered 2014-01-31: 20 mg via INTRAVENOUS

## 2014-01-31 MED ORDER — DEXAMETHASONE SODIUM PHOSPHATE 20 MG/5ML IJ SOLN
20.0000 mg | Freq: Once | INTRAMUSCULAR | Status: AC
  Administered 2014-01-31: 20 mg via INTRAVENOUS

## 2014-01-31 MED ORDER — NYSTATIN 100000 UNIT/ML MT SUSP: 5.0000 mL | Freq: Four times a day (QID) | OROMUCOSAL | Status: DC

## 2014-01-31 MED ORDER — VINCRISTINE SULFATE CHEMO INJECTION 1 MG/ML
1.0000 mg | Freq: Once | INTRAVENOUS | Status: AC
  Administered 2014-01-31: 1 mg via INTRAVENOUS
  Filled 2014-01-31: qty 1

## 2014-01-31 MED ORDER — ACETAMINOPHEN 325 MG PO TABS
650.0000 mg | ORAL_TABLET | Freq: Once | ORAL | Status: AC
  Administered 2014-01-31: 650 mg via ORAL

## 2014-01-31 MED ORDER — ACETAMINOPHEN 325 MG PO TABS
ORAL_TABLET | ORAL | Status: AC
  Filled 2014-01-31: qty 2

## 2014-01-31 MED ORDER — DIPHENHYDRAMINE HCL 25 MG PO CAPS
ORAL_CAPSULE | ORAL | Status: AC
  Filled 2014-01-31: qty 1

## 2014-01-31 MED ORDER — SODIUM CHLORIDE 0.9 % IV SOLN
100.0000 mg | Freq: Once | INTRAVENOUS | Status: AC
  Administered 2014-01-31: 100 mg via INTRAVENOUS
  Filled 2014-01-31: qty 10

## 2014-01-31 MED ORDER — HEPARIN SOD (PORK) LOCK FLUSH 100 UNIT/ML IV SOLN
500.0000 [IU] | Freq: Once | INTRAVENOUS | Status: AC | PRN
  Administered 2014-01-31: 500 [IU]
  Filled 2014-01-31: qty 5

## 2014-01-31 MED ORDER — ONDANSETRON 16 MG/50ML IVPB (CHCC)
16.0000 mg | Freq: Once | INTRAVENOUS | Status: AC
  Administered 2014-01-31: 16 mg via INTRAVENOUS

## 2014-01-31 MED ORDER — FAMOTIDINE IN NACL 20-0.9 MG/50ML-% IV SOLN
INTRAVENOUS | Status: AC
  Filled 2014-01-31: qty 50

## 2014-01-31 MED ORDER — ONDANSETRON 16 MG/50ML IVPB (CHCC)
INTRAVENOUS | Status: AC
  Filled 2014-01-31: qty 16

## 2014-01-31 NOTE — Progress Notes (Signed)
1315 Rituxan infused at 50 mg/hr for entire 100 mg. 1435 pt states "I feel good, let's restart the medicine". VSS. Restarted at 50mg /hr.

## 2014-01-31 NOTE — Patient Instructions (Signed)
[facility] Discharge Instructions for Patients Receiving Chemotherapy  Today you received the following chemotherapy agents ADRIAMYCIN, VINCRISTINE, CYTOXAN, RITUXAN  To help prevent nausea and vomiting after your treatment, we encourage you to take your nausea medication ZOFRAN 8 MG STARTING AT 10 PM THEN EVALUATE IN AM IF NEEDED.   If you develop nausea and vomiting that is not controlled by your nausea medication, call the clinic.   BELOW ARE SYMPTOMS THAT SHOULD BE REPORTED IMMEDIATELY:  *FEVER GREATER THAN 100.5 F  *CHILLS WITH OR WITHOUT FEVER  NAUSEA AND VOMITING THAT IS NOT CONTROLLED WITH YOUR NAUSEA MEDICATION  *UNUSUAL SHORTNESS OF BREATH  *UNUSUAL BRUISING OR BLEEDING  TENDERNESS IN MOUTH AND THROAT WITH OR WITHOUT PRESENCE OF ULCERS  *URINARY PROBLEMS  *BOWEL PROBLEMS  UNUSUAL RASH Items with * indicate a potential emergency and should be followed up as soon as possible.  Feel free to call the clinic you have any questions or concerns. The clinic phone number is [phone].

## 2014-01-31 NOTE — Progress Notes (Signed)
ID: Lorenso Courier OB: Aug 26, 1925  MR#: [id number]  CSN#:[id number]  PCP: [name], MD GYN:   SU:  OTHER MD:  CHIEF COMPLAINT: large B-cell lymphoma CURRENT THERAPY: "mini-CHOP" and rituxan  HISTORY OF PRESENT ILLNESS: Mr Brecheisen developed abdominal pain and distension sometime mid August 2015. He thought this was diverticular disease. Eventually he brought these concerns to Dr [name] and per patient's report and chart review a CT of the abd/pelvis was obtained which showed significant adenopathy. Biopsy 12/13/2013 at [facility] reportedly showed a large cell, B-cell lymphoma. The patient has developed "B" symptoms including unexplained fatigue, fevers and drenching sweats. He presented to [facility] 01/05/2014 with worsening shortness of breath. Workup here included a chest CT 01/05/2014 which showed diffuse upper abdominal and mediastinal adenopathy, with bilateral pleural effusions. V/Q scan was low probability for PE and bilateral LE dopples were negative for DVT. Echo 01/06/2014 showed an EF or 55-60%. He developed A-fib 01/06/2014 and has been started on cardizem. Other problems include acute renal injury, DM, HTN, dyslipidemia with atherosclerosis noted on CT scans and OSA requiring norcturnal C-pap.  INTERVAL HISTORY:  Chao returns to clinic for follow up of his newly diagnosed b-cell lymphoma, accompanied by his son, [name]. He is here to receive cycle 2 day 1 of R-CHOP therapy.  He receives CHOP on day 1 and Rituximab on day 2, with neulasta on day 3 for granulocyte support.  He receives this on a 21 day cycle.    Keishaun is doing well today.  He does have mild intermittent numbness in his fingertips that usually occurs while lying down.  He does have constipation that he manages with Miralax and Colace daily.  He is drinking two boost high protein drinks per day and tells me that he is really pushing his protein intake.  He has occasional fatigue.  He does have occasional low  grade fevers.  This is longstanding and has been attributed to treatment in the past.  His son questions the reliability of his thermometers.  He does have some difficulty bathing and would like a shower chair to assist with bathing better since he does have difficulty with this.    REVIEW OF SYSTEMS: Please see below    PAST MEDICAL HISTORY: Past Medical History  Diagnosis Date  . Hypertension   . Hyperlipemia   . Anxiety   . Lymphadenopathy, abdominal   . Urethral stricture   . Anemia   . BPPV (benign paroxysmal positional vertigo)   . Lymphoma   . Diabetes mellitus without complication   . Obstructive sleep apnea     PAST SURGICAL HISTORY: Past Surgical History  Procedure Laterality Date  . Back surgery    . Prostate surgery    . Tonsillectomy      FAMILY HISTORY History reviewed. No pertinent family history.  Patient's father had a "muscle disease" and died from unclear causes age 82. Patient's mother died from heartproblems age 70. The patient had 1 brother, 5 sisters. 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 He and his wife [name] have ben married 76 years. Son Laine lives in [place] and works in Press photographer. Son [name] lives in [place] and is retired (disabled). Son [name] runs a maintenance shop in [place], where he lives. Son [name] works in telephones in [place]. [name]'s wife, [name], is an Therapist, sports and worked for [employer] many years. She now works  for [employer]The patient has 8 gch and 6 ggch. He is a Psychologist, forensic    ADVANCED DIRECTIVES: in place. Mr Taubman tells me "if they can't do anything for me" he would not want resuscitation or life support. His wife [name] is his health care POA, with son [name] listed second.    HEALTH MAINTENANCE: History  Substance Use Topics  . Smoking status: Never Smoker   . Smokeless tobacco: Not on file  . Alcohol Use: No     Allergies   Allergen Reactions  . Rituximab Other (See Comments)    Developed wheezing & rigors when infusion rate increased to 100 mg/hr.  Tolerates rate of 50 mg/hr.  . Caffeine Other (See Comments)    Makes patient feel like he's going to die  . Morphine And Related Other (See Comments)    excitability  . Penicillins Rash    Current Outpatient Prescriptions  Medication Sig Dispense Refill  . allopurinol (ZYLOPRIM) 300 MG tablet Take 1 tablet (300 mg total) by mouth daily.  30 tablet  1  . aspirin 325 MG tablet Take 1 tablet (325 mg total) by mouth daily.  30 tablet  0  . ciprofloxacin (CIPRO) 500 MG tablet Take 1 tablet (500 mg total) by mouth 2 (two) times daily.  10 tablet  0  . clorazepate (TRANXENE) 7.5 MG tablet Take 7.5 mg by mouth 2 (two) times daily as needed for anxiety or sleep.      . clotrimazole-betamethasone (LOTRISONE) cream Apply 1 application topically 2 (two) times daily.      Marland Kitchen diltiazem (CARDIZEM CD) 120 MG 24 hr capsule Take 1 capsule (120 mg total) by mouth daily.  30 capsule  1  . feeding supplement, ENSURE COMPLETE, (ENSURE COMPLETE) LIQD Take 237 mLs by mouth daily.  20 Bottle  1  . insulin aspart (NOVOLOG) 100 UNIT/ML injection Inject 0-20 Units into the skin 3 (three) times daily with meals.  10 mL  11  . lidocaine-prilocaine (EMLA) cream Apply 1 application topically as needed. APPLY TO PORT SITE 1 HOUR PRIOR TO THERAPY. DO NOT RUB IN. COVER WITH SARAN WRAP.  30 g  2  . metoprolol (LOPRESSOR) 50 MG tablet Take 25 mg by mouth 2 (two) times daily.        . naproxen (NAPROSYN) 375 MG tablet Take 1 tablet (375 mg total) by mouth 3 (three) times daily with meals.  20 tablet  0  . nystatin (MYCOSTATIN) 100000 UNIT/ML suspension       . ondansetron (ZOFRAN) 8 MG tablet Take 1 tablet (8 mg total) by mouth every 12 (twelve) hours.  20 tablet  0  . ondansetron (ZOFRAN) 8 MG tablet Take 8 mg by mouth 2 (two) times daily as needed for nausea or vomiting.      . simvastatin (ZOCOR)  20 MG tablet Take 20 mg by mouth daily.      . traMADol (ULTRAM) 50 MG tablet Take 50 mg by mouth every 6 (six) hours as needed for moderate pain.       No current facility-administered medications for this visit.    OBJECTIVE: Filed Vitals:   01/31/14 0853  BP: 135/64  Pulse: 87  Temp: 98.6 F (37 C)  Resp: 18     Body mass index is 27.2 kg/(m^2).   Review of Systems  Constitutional: Positive for fever, weight loss and malaise/fatigue.  HENT: Negative.   Eyes: Negative.   Respiratory: Negative.   Cardiovascular: Positive for palpitations and leg  swelling. Negative for chest pain, orthopnea and claudication.       H/o atrial fibrillation   Gastrointestinal: Positive for constipation. Negative for nausea, vomiting and abdominal pain.  Genitourinary: Negative.   Musculoskeletal: Negative.   Skin: Negative.   Neurological: Positive for tingling and weakness. Negative for dizziness, tremors, sensory change, speech change, focal weakness and seizures.  Endo/Heme/Allergies: Negative.      Physical Exam  Constitutional: He is oriented to person, place, and time and well-developed, well-nourished, and in no distress. No distress.  HENT:  Head: Normocephalic and atraumatic.  Mouth/Throat: Oropharynx is clear and moist. No oropharyngeal exudate.  Eyes: No scleral icterus.  Neck: Neck supple.  Cardiovascular: Normal rate, regular rhythm, normal heart sounds and intact distal pulses.  Exam reveals no gallop and no friction rub.   No murmur heard. Pulmonary/Chest: Breath sounds normal. He has no wheezes. He has no rales. He exhibits no tenderness.  Abdominal: Soft. Bowel sounds are normal. He exhibits no mass. There is no rebound and no guarding.  Musculoskeletal: Normal range of motion.  Lymphadenopathy:    He has no cervical adenopathy.    He has no axillary adenopathy.       Right: No inguinal adenopathy present.  Neurological: He is alert and oriented to person, place, and  time.  Skin: Skin is warm and dry. No rash noted. He is not diaphoretic. No erythema.    ECOG FS:1 - Symptomatic but completely ambulatory       LAB RESULTS:   Chemistry      Component Value Date/Time   NA 138 01/31/2014 0811   NA 137 01/17/2014 0021   K 4.0 01/31/2014 0811   K 3.9 01/17/2014 0021   CL 98 01/17/2014 0021   CO2 27 01/31/2014 0811   CO2 24 01/12/2014 0418   BUN 21.6 01/31/2014 0811   BUN 22 01/17/2014 0021   CREATININE 1.1 01/31/2014 0811   CREATININE 0.90 01/17/2014 0021      Component Value Date/Time   CALCIUM 8.8 01/31/2014 0811   CALCIUM 7.9* 01/12/2014 0418   ALKPHOS 64 01/31/2014 0811   ALKPHOS 100 01/12/2014 0418   AST 22 01/31/2014 0811   AST 29 01/12/2014 0418   ALT 18 01/31/2014 0811   ALT 31 01/12/2014 0418   BILITOT 0.50 01/31/2014 0811   BILITOT 0.6 01/12/2014 0418     Lab Results  Component Value Date   WBC 6.8 01/31/2014   HGB 10.7* 01/31/2014   HCT 32.9* 01/31/2014   MCV 85.0 01/31/2014   PLT 255 01/31/2014    Urinalysis    Component Value Date/Time   COLORURINE YELLOW 01/17/2014 0029   APPEARANCEUR CLEAR 01/17/2014 0029   LABSPEC 1.019 01/17/2014 0029   PHURINE 5.5 01/17/2014 0029   GLUCOSEU NEGATIVE 01/17/2014 0029   HGBUR NEGATIVE 01/17/2014 0029   BILIRUBINUR NEGATIVE 01/17/2014 0029   KETONESUR NEGATIVE 01/17/2014 0029   PROTEINUR NEGATIVE 01/17/2014 0029   UROBILINOGEN 1.0 01/17/2014 0029   NITRITE NEGATIVE 01/17/2014 0029   LEUKOCYTESUR TRACE* 01/17/2014 0029    STUDIES: Dg Chest 1 View  01/08/2014   CLINICAL DATA:  Post thoracentesis.  EXAM: CHEST - 1 VIEW  COMPARISON:  01/08/2014  FINDINGS: Improved aeration at the right lung base consistent with a recent thoracentesis. There is decreased right pleural fluid. Persistent densities at the left lung base suggest pleural fluid and atelectasis. Heart size is grossly stable. The trachea is midline.  IMPRESSION: Negative for a pneumothorax following the right thoracentesis. Decreased  right  pleural effusion.  Left basilar densities are compatible with pleural fluid.   Electronically Signed   By: Markus Daft M.D.   On: 01/08/2014 13:48   Dg Chest 2 View  01/11/2014   CLINICAL DATA:  Shortness of breath.  Follow-up effusions.  EXAM: CHEST  2 VIEW  COMPARISON:  01/09/2014  FINDINGS: Heart is upper limits normal in size. Bilateral pleural effusions noted, left greater than right, similar to prior study. Bibasilar atelectasis. No acute bony abnormality.  IMPRESSION: Stable bilateral effusions, left slightly greater than right. Bibasilar atelectasis.   Electronically Signed   By: Rolm Baptise M.D.   On: 01/11/2014 12:04   Dg Chest 2 View  01/09/2014   CLINICAL DATA:  Pleural effusion.  EXAM: CHEST  2 VIEW  COMPARISON:  January 08, 2014.  FINDINGS: Stable cardiomediastinal silhouette. No pneumothorax is noted. Minimal right pleural effusion is noted. Otherwise right lung is clear. Stable mild left pleural effusion is noted with possible underlying atelectasis.  IMPRESSION: Stable bilateral pleural effusions with left greater than right.   Electronically Signed   By: Sabino Dick M.D.   On: 01/09/2014 15:46   Dg Chest 2 View  01/08/2014   CLINICAL DATA:  Followup effusion. Cough, shortness of breath and weakness.  EXAM: CHEST  2 VIEW  COMPARISON:  01/05/2014 as well as chest CT 01/05/2014  FINDINGS: Lungs are hypoinflated with persistent hazy right basilar opacification compatible with known small effusion and atelectasis. Slight worsening opacification the left base compatible known moderate effusions/atelectasis. Cannot completely exclude infection in the lung bases. Prominence of the right hilar region compatible with known adenopathy in this patient with a history of lymphoma. Cardiomediastinal silhouette and remainder the exam is unchanged.  IMPRESSION: Stable right base opacification compatible with known small effusion and associated atelectasis. Slight worsening left base opacification  compatible with moderate left effusion and atelectasis. Cannot exclude infection in the lung bases.  Prominent right hilum compatible is known adenopathy in this patient with history of lymphoma.   Electronically Signed   By: Marin Olp M.D.   On: 01/08/2014 10:18   Dg Chest 2 View  01/05/2014   CLINICAL DATA:  Shortness of breath.  EXAM: CHEST  2 VIEW  COMPARISON:  None available.  FINDINGS: The cardiac silhouette, mediastinal and hilar contours are somewhat prominent. The upper mediastinum is slightly widened and the right pulmonary hila is prominent. There are small bilateral pleural effusions and overlying atelectasis. No edema or pneumothorax. The bony thorax is grossly intact.  IMPRESSION: Prominent superior mediastinal contours and prominent right hilum. Chest CT may be helpful for further evaluation.  Bilateral pleural effusions and bibasilar atelectasis.   Electronically Signed   By: Kalman Jewels M.D.   On: 01/05/2014 22:32   Ct Chest Wo Contrast  01/05/2014   CLINICAL DATA:  Chest pain.  Recent diagnosis of lymphoma.  EXAM: CT CHEST WITHOUT CONTRAST  TECHNIQUE: Multidetector CT imaging of the chest was performed following the standard protocol without IV contrast.  COMPARISON:  None.  FINDINGS: THORACIC INLET/BODY WALL:  Adenopathy at the thoracic inlet, with a node posterior to the left IJ measuring 15 mm short axis. Bilateral axillary lymphadenopathy, with a solid node on the left measuring 22 mm in short axis on image 18.  MEDIASTINUM:  No cardiomegaly or pericardial effusion. Diffuse mediastinal lymphadenopathy, present in all stations. Index node in the sub- carina region measures 28 mm short axis, larger than on abdominal imaging. Index left anterior juxtadiaphragmatic  node is also larger at 14 mm (previously 10 mm). Bilateral hilar lymphadenopathy. Negative esophagus. Diffuse atherosclerosis, including the coronary arteries. No evidence of acute vascular finding.  LUNG WINDOWS:  Small,  layering water density pleural effusions bilaterally. There is atelectasis in both lower lobes.  7 mm nodule in the right middle lobe. This will be re-assesed at surveillance imaging.  Spiculated density at the right apex favors pleural/parenchymal scarring.  No evidence of edema, pneumothorax, or pneumonia.  UPPER ABDOMEN:  Diffuse adenopathy in the upper abdominal ligaments. No definitive progression since recent abdominal CT. The spleen is partially imaged but clearly enlarged.  OSSEOUS:  No acute fracture.  No suspicious lytic or blastic lesions.  IMPRESSION: 1. Diffuse lymphoma, with intrathoracic progression since 12/13/2013. 2. Small bilateral pleural effusions with lower lobe atelectasis.   Electronically Signed   By: [name] M.D.   On: 01/05/2014 23:03   Nm Pulmonary Perf And Vent  01/06/2014   CLINICAL DATA:  Shortness of breath question pulmonary embolism, history hypertension, diabetes, lymphoma  EXAM: NUCLEAR MEDICINE VENTILATION - PERFUSION LUNG SCAN  TECHNIQUE: Ventilation images were obtained in multiple projections using inhaled aerosol technetium 99 M DTPA. Perfusion images were obtained in multiple projections after intravenous injection of Tc-53mMAA.  RADIOPHARMACEUTICALS:  41.0 mCi Tc-971mTPA aerosol inhalation and 5.0 mCi Tc-9950mA IV  COMPARISON:  None; correlation chest radiograph and CT chest exams of 01/05/2014  FINDINGS: Ventilation: Mild central airway deposition of tracer. Minimal peripheral regularity of peripheral ventilation in both lungs.  Perfusion: Irregular areas of nonsegmental diminished perfusion at the lateral and posterolateral RIGHT lung and at the posterior LEFT lung, corresponding to pleural effusions identified on chest CT. Small subsegmental perfusion defect RIGHT lower lobe. No additional segmental or subsegmental perfusion defects identified.  Chest CT demonstrates small to moderate-sized pleural effusions bilaterally larger on RIGHT with compressive  atelectasis of BILATERAL lower lobes also greater on RIGHT.  IMPRESSION: BILATERAL pleural effusions causing compression of the lungs bilaterally.  Single subsegmental perfusion defect RIGHT lower lobe.  Findings represent a low probability for pulmonary embolism.   Electronically Signed   By: MarLavonia DanaD.   On: 01/06/2014 12:19   Ir Fluoro Guide Cv Line Right  01/12/2014   INDICATION: History lymphoma. In need of intravenous access for chemotherapy administration  EXAM: IMPLANTED PORT A CATH PLACEMENT WITH ULTRASOUND AND FLUOROSCOPIC GUIDANCE  COMPARISON:  None.  MEDICATIONS: Vancomycin 1 gm IV; The antibiotic was administered within an appropriate time interval prior to skin puncture.  ANESTHESIA/SEDATION: Versed 1 mg IV; Fentanyl 50 mcg IV;  Total Moderate Sedation Time  30  minutes.  CONTRAST:  None  FLUOROSCOPY TIME:  24 seconds.  COMPLICATIONS: None immediate  PROCEDURE: The procedure, risks, benefits, and alternatives were explained to the patient. Questions regarding the procedure were encouraged and answered. The patient understands and consents to the procedure.  The right neck and chest were prepped with chlorhexidine in a sterile fashion, and a sterile drape was applied covering the operative field. Maximum barrier sterile technique with sterile gowns and gloves were used for the procedure. A timeout was performed prior to the initiation of the procedure. Local anesthesia was provided with 1% lidocaine with epinephrine.  After creating a small venotomy incision, a micropuncture kit was utilized to access the internal jugular vein under direct, real-time ultrasound guidance. Ultrasound image documentation was performed. The microwire was kinked to measure appropriate catheter length.  A subcutaneous port pocket was then created along the  upper chest wall utilizing a combination of sharp and blunt dissection. The pocket was irrigated with sterile saline. A single lumen ISP power injectable port was  chosen for placement. The 8 Fr catheter was tunneled from the port pocket site to the venotomy incision. The port was placed in the pocket. The external catheter was trimmed to appropriate length. At the venotomy, an 8 Fr peel-away sheath was placed over a guidewire under fluoroscopic guidance. The catheter was then placed through the sheath and the sheath was removed. Final catheter positioning was confirmed and documented with a fluoroscopic spot radiograph. The port was accessed with a Huber needle, aspirated and flushed with heparinized saline.  The venotomy site was closed with an interrupted 4-0 Vicryl suture. The port pocket incision was closed with interrupted 2-0 Vicryl suture and the skin was opposed with a running subcuticular 4-0 Vicryl suture. Dermabond and Steri-strips were applied to both incisions. Dressings were placed. The patient tolerated the procedure well without immediate post procedural complication.  FINDINGS: After catheter placement, the tip lies within the superior cavoatrial junction. The catheter aspirates and flushes normally and is ready for immediate use.  IMPRESSION: Successful placement of a right internal jugular approach power injectable Port-A-Cath. The catheter is ready for immediate use.   Electronically Signed   By: Sandi Mariscal M.D.   On: 01/12/2014 09:49   Ir US Guide Vasc Access Right  01/12/2014   INDICATION: History lymphoma. In need of intravenous access for chemotherapy administration  EXAM: IMPLANTED PORT A CATH PLACEMENT WITH ULTRASOUND AND FLUOROSCOPIC GUIDANCE  COMPARISON:  None.  MEDICATIONS: Vancomycin 1 gm IV; The antibiotic was administered within an appropriate time interval prior to skin puncture.  ANESTHESIA/SEDATION: Versed 1 mg IV; Fentanyl 50 mcg IV;  Total Moderate Sedation Time  30  minutes.  CONTRAST:  None  FLUOROSCOPY TIME:  24 seconds.  COMPLICATIONS: None immediate  PROCEDURE: The procedure, risks, benefits, and alternatives were explained to the  patient. Questions regarding the procedure were encouraged and answered. The patient understands and consents to the procedure.  The right neck and chest were prepped with chlorhexidine in a sterile fashion, and a sterile drape was applied covering the operative field. Maximum barrier sterile technique with sterile gowns and gloves were used for the procedure. A timeout was performed prior to the initiation of the procedure. Local anesthesia was provided with 1% lidocaine with epinephrine.  After creating a small venotomy incision, a micropuncture kit was utilized to access the internal jugular vein under direct, real-time ultrasound guidance. Ultrasound image documentation was performed. The microwire was kinked to measure appropriate catheter length.  A subcutaneous port pocket was then created along the upper chest wall utilizing a combination of sharp and blunt dissection. The pocket was irrigated with sterile saline. A single lumen ISP power injectable port was chosen for placement. The 8 Fr catheter was tunneled from the port pocket site to the venotomy incision. The port was placed in the pocket. The external catheter was trimmed to appropriate length. At the venotomy, an 8 Fr peel-away sheath was placed over a guidewire under fluoroscopic guidance. The catheter was then placed through the sheath and the sheath was removed. Final catheter positioning was confirmed and documented with a fluoroscopic spot radiograph. The port was accessed with a Huber needle, aspirated and flushed with heparinized saline.  The venotomy site was closed with an interrupted 4-0 Vicryl suture. The port pocket incision was closed with interrupted 2-0 Vicryl suture and the skin was  opposed with a running subcuticular 4-0 Vicryl suture. Dermabond and Steri-strips were applied to both incisions. Dressings were placed. The patient tolerated the procedure well without immediate post procedural complication.  FINDINGS: After catheter  placement, the tip lies within the superior cavoatrial junction. The catheter aspirates and flushes normally and is ready for immediate use.  IMPRESSION: Successful placement of a right internal jugular approach power injectable Port-A-Cath. The catheter is ready for immediate use.   Electronically Signed   By: Sandi Mariscal M.D.   On: 01/12/2014 09:49   Dg Abd Acute W/chest  01/17/2014   CLINICAL DATA:  Sudden onset of fever late last night, and diarrhea several hours ago. Generalized abdominal discomfort. Patient on chemotherapy for current history of lymphoma. Initial encounter.  EXAM: ACUTE ABDOMEN SERIES (ABDOMEN 2 VIEW & CHEST 1 VIEW)  COMPARISON:  Chest radiograph performed 01/11/2014  FINDINGS: The lungs are well-aerated. A small left pleural effusion is noted. Pulmonary vascularity is at the upper limits of normal. No pneumothorax is seen. The cardiomediastinal silhouette is within normal limits. A right-sided chest port is noted ending about the mid SVC.  The visualized bowel gas pattern is unremarkable. Scattered stool and air are seen within the colon; there is no evidence of small bowel dilatation to suggest obstruction. No free intra-abdominal air is identified on the provided upright view.  No acute osseous abnormalities are seen; the sacroiliac joints are unremarkable in appearance.  IMPRESSION: 1. Unremarkable bowel gas pattern; no free intra-abdominal air seen. 2. Small left pleural effusion noted. Lungs otherwise grossly clear.   Electronically Signed   By: Garald Balding M.D.   On: 01/17/2014 01:26   US Thoracentesis Asp Pleural Space W/img Guide  01/08/2014   CLINICAL DATA:  Shortness of breath, bilateral pleural effusions. History of lymphoma. Request diagnostic and therapeutic thoracentesis.  EXAM: ULTRASOUND GUIDED RIGHT THORACENTESIS  COMPARISON:  None  PROCEDURE: An ultrasound guided thoracentesis was thoroughly discussed with the patient and questions answered. The benefits, risks,  alternatives and complications were also discussed. The patient understands and wishes to proceed with the procedure. Written consent was obtained.  Ultrasound of the chest finds small to moderate size left pleural effusion and a moderate to large right pleural effusion. Right side is chosen for thoracentesis.  Ultrasound was performed to localize and mark an adequate pocket of fluid in the right chest. The area was then prepped and draped in the normal sterile fashion. 1% Lidocaine was used for local anesthesia. Under ultrasound guidance a 19 gauge Yueh catheter was introduced. Thoracentesis was performed. The catheter was removed and a dressing applied.  Complications:  None immediate  FINDINGS: A total of approximately 1 L of bloody pleural fluid was removed. A fluid sample wassent for laboratory analysis.  IMPRESSION: Successful ultrasound guided right thoracentesis yielding 1 L of pleural fluid.  Read by: Ascencion Dike PA-C   Electronically Signed   By: Daryll Brod M.D.   On: 01/08/2014 12:59    ASSESSMENT: 80 y.o.. [name] with a new diagnosis of diffuse large-cell, B-cell non-Hodgkin's lymphoma   (1) rituximab started 01/07/2014, with initial infusion reaction but able to receive entire dose that evening; to be repeated every 21 days  (2) dose-reduced CHOP started 01/10/2014--to be repeated every 21 days.  Patient has h/o rituximab reactions and will receive 144m of Rituximab on day 1, in addition to slow insfusion on day 2.  The plan is to re-stage Mr. Vitullo after three cycles of treatment.    (  3) tumor lysis syndrome while hospitalized: very favorable initial reaction to treatment; LDH, uric acid normalizing  (4) AKI while hospitalized: creatinine has normalized   PLAN:   Yordin is doing well today.  His lab work has improved.  His WBC count and platelets have normalized.  He does continue to have anemia, that is multifactorial and related to his disease along with his treatment.   This is improved today.  His albumin level, though low, is improved today and he is doing a good job with his protein intake at home.  I reviewed all of this in detail with the patient and his son.  He will proceed with treatment today.  He will return tomorrow for Rituximab and on Wednesday for Neulasta.  He will see heather in one week for labs and a nadir check.  He follows up with Dr. Jana Hakim at the beginning of cycle 3.  All of above was reviewed with Dr. Jana Hakim in detail.    Zev and his son understand and agree with this plan. They have been encouraged to call with any issues that might arise before their next visit here.    I spent 25 minutes counseling the patient face to face.  The total time spent in the appointment was 30 minutes.   Minette Headland, [place] [phone] 01/31/2014 8:58 AM

## 2014-01-31 NOTE — Progress Notes (Signed)
1425- reports feeling ' warm inside abdomen , lower" ,Denies any other symptoms. VSS.  Rituxan stopped. Oxygen 91 %.When pt takes deep breaths oxygen sat increased to 96%.

## 2014-02-01 ENCOUNTER — Ambulatory Visit (HOSPITAL_BASED_OUTPATIENT_CLINIC_OR_DEPARTMENT_OTHER): Payer: Medicare Other

## 2014-02-01 ENCOUNTER — Other Ambulatory Visit: Payer: No Typology Code available for payment source

## 2014-02-01 VITALS — BP 138/53 | HR 62 | Temp 96.9°F | Resp 17

## 2014-02-01 DIAGNOSIS — C8330 Diffuse large B-cell lymphoma, unspecified site: Secondary | ICD-10-CM

## 2014-02-01 DIAGNOSIS — Z5112 Encounter for antineoplastic immunotherapy: Secondary | ICD-10-CM

## 2014-02-01 MED ORDER — DIPHENHYDRAMINE HCL 25 MG PO CAPS
25.0000 mg | ORAL_CAPSULE | Freq: Once | ORAL | Status: AC
  Administered 2014-02-01: 25 mg via ORAL

## 2014-02-01 MED ORDER — FAMOTIDINE IN NACL 20-0.9 MG/50ML-% IV SOLN
20.0000 mg | Freq: Two times a day (BID) | INTRAVENOUS | Status: DC
Start: 2014-02-01 — End: 2014-02-01
  Administered 2014-02-01: 20 mg via INTRAVENOUS

## 2014-02-01 MED ORDER — DEXAMETHASONE 4 MG PO TABS
ORAL_TABLET | ORAL | Status: AC
  Filled 2014-02-01: qty 1

## 2014-02-01 MED ORDER — ACETAMINOPHEN 325 MG PO TABS
ORAL_TABLET | ORAL | Status: AC
  Filled 2014-02-01: qty 2

## 2014-02-01 MED ORDER — DEXAMETHASONE 4 MG PO TABS
4.0000 mg | ORAL_TABLET | Freq: Once | ORAL | Status: AC
  Administered 2014-02-01: 4 mg via ORAL

## 2014-02-01 MED ORDER — HEPARIN SOD (PORK) LOCK FLUSH 100 UNIT/ML IV SOLN
500.0000 [IU] | Freq: Once | INTRAVENOUS | Status: AC | PRN
  Administered 2014-02-01: 500 [IU]
  Filled 2014-02-01: qty 5

## 2014-02-01 MED ORDER — ACETAMINOPHEN 325 MG PO TABS
650.0000 mg | ORAL_TABLET | Freq: Once | ORAL | Status: AC
  Administered 2014-02-01: 650 mg via ORAL

## 2014-02-01 MED ORDER — FAMOTIDINE IN NACL 20-0.9 MG/50ML-% IV SOLN
INTRAVENOUS | Status: AC
  Filled 2014-02-01: qty 50

## 2014-02-01 MED ORDER — SODIUM CHLORIDE 0.9 % IV SOLN
700.0000 mg | Freq: Once | INTRAVENOUS | Status: AC
  Administered 2014-02-01: 700 mg via INTRAVENOUS
  Filled 2014-02-01: qty 70

## 2014-02-01 MED ORDER — DIPHENHYDRAMINE HCL 25 MG PO CAPS
ORAL_CAPSULE | ORAL | Status: AC
  Filled 2014-02-01: qty 1

## 2014-02-01 MED ORDER — SODIUM CHLORIDE 0.9 % IV SOLN
Freq: Once | INTRAVENOUS | Status: AC
  Administered 2014-02-01: 08:00:00 via INTRAVENOUS

## 2014-02-01 MED ORDER — SODIUM CHLORIDE 0.9 % IJ SOLN
10.0000 mL | INTRAMUSCULAR | Status: DC | PRN
  Administered 2014-02-01: 10 mL
  Filled 2014-02-01: qty 10

## 2014-02-01 NOTE — Progress Notes (Signed)
1330 Patient tolerated treatment with no complaints. VSS throughout rituxan infusion.

## 2014-02-01 NOTE — Patient Instructions (Signed)
[facility] Discharge Instructions for Patients Receiving Chemotherapy  Today you received the following chemotherapy agents rituxan.   To help prevent nausea and vomiting after your treatment, we encourage you to take your nausea medication as directed.     If you develop nausea and vomiting that is not controlled by your nausea medication, call the clinic.   BELOW ARE SYMPTOMS THAT SHOULD BE REPORTED IMMEDIATELY:  *FEVER GREATER THAN 100.5 F  *CHILLS WITH OR WITHOUT FEVER  NAUSEA AND VOMITING THAT IS NOT CONTROLLED WITH YOUR NAUSEA MEDICATION  *UNUSUAL SHORTNESS OF BREATH  *UNUSUAL BRUISING OR BLEEDING  TENDERNESS IN MOUTH AND THROAT WITH OR WITHOUT PRESENCE OF ULCERS  *URINARY PROBLEMS  *BOWEL PROBLEMS  UNUSUAL RASH Items with * indicate a potential emergency and should be followed up as soon as possible.  Feel free to call the clinic you have any questions or concerns. The clinic phone number is [phone].  

## 2014-02-02 ENCOUNTER — Other Ambulatory Visit: Payer: Self-pay | Admitting: *Deleted

## 2014-02-02 ENCOUNTER — Ambulatory Visit (HOSPITAL_BASED_OUTPATIENT_CLINIC_OR_DEPARTMENT_OTHER): Payer: Medicare Other

## 2014-02-02 ENCOUNTER — Telehealth: Payer: Self-pay

## 2014-02-02 VITALS — BP 128/58 | HR 64 | Temp 97.4°F

## 2014-02-02 DIAGNOSIS — C8330 Diffuse large B-cell lymphoma, unspecified site: Secondary | ICD-10-CM

## 2014-02-02 DIAGNOSIS — Z5189 Encounter for other specified aftercare: Secondary | ICD-10-CM

## 2014-02-02 MED ORDER — PEGFILGRASTIM INJECTION 6 MG/0.6ML
6.0000 mg | Freq: Once | SUBCUTANEOUS | Status: AC
  Administered 2014-02-02: 6 mg via SUBCUTANEOUS
  Filled 2014-02-02: qty 0.6

## 2014-02-02 NOTE — Telephone Encounter (Signed)
Patient denies and nausea or vomiting.  No change in bowel habits or appetite.  Denies any mouth sores.  Stated he was achy all over yesterday during his treatment but denies any pain at this time.  Understands to call with any temperature higher than 100.5.  Will call with any other questions or concerns.

## 2014-02-02 NOTE — Patient Instructions (Signed)
Pegfilgrastim injection What is this medicine? PEGFILGRASTIM (peg fil GRA stim) is a long-acting granulocyte colony-stimulating factor that stimulates the growth of neutrophils, a type of white blood cell important in the body's fight against infection. It is used to reduce the incidence of fever and infection in patients with certain types of cancer who are receiving chemotherapy that affects the bone marrow. This medicine may be used for other purposes; ask your health care provider or pharmacist if you have questions. COMMON BRAND NAME(S): Neulasta What should I tell my health care provider before I take this medicine? They need to know if you have any of these conditions: -latex allergy -ongoing radiation therapy -sickle cell disease -skin reactions to acrylic adhesives (On-Body Injector only) -an unusual or allergic reaction to pegfilgrastim, filgrastim, other medicines, foods, dyes, or preservatives -pregnant or trying to get pregnant -breast-feeding How should I use this medicine? This medicine is for injection under the skin. If you get this medicine at home, you will be taught how to prepare and give the pre-filled syringe or how to use the On-body Injector. Refer to the patient Instructions for Use for detailed instructions. Use exactly as directed. Take your medicine at regular intervals. Do not take your medicine more often than directed. It is important that you put your used needles and syringes in a special sharps container. Do not put them in a trash can. If you do not have a sharps container, call your pharmacist or healthcare provider to get one. Talk to your pediatrician regarding the use of this medicine in children. Special care may be needed. Overdosage: If you think you have taken too much of this medicine contact a poison control center or emergency room at once. NOTE: This medicine is only for you. Do not share this medicine with others. What if I miss a dose? It is  important not to miss your dose. Call your doctor or health care professional if you miss your dose. If you miss a dose due to an On-body Injector failure or leakage, a new dose should be administered as soon as possible using a single prefilled syringe for manual use. What may interact with this medicine? Interactions have not been studied. Give your health care provider a list of all the medicines, herbs, non-prescription drugs, or dietary supplements you use. Also tell them if you smoke, drink alcohol, or use illegal drugs. Some items may interact with your medicine. This list may not describe all possible interactions. Give your health care provider a list of all the medicines, herbs, non-prescription drugs, or dietary supplements you use. Also tell them if you smoke, drink alcohol, or use illegal drugs. Some items may interact with your medicine. What should I watch for while using this medicine? You may need blood work done while you are taking this medicine. If you are going to need a MRI, CT scan, or other procedure, tell your doctor that you are using this medicine (On-Body Injector only). What side effects may I notice from receiving this medicine? Side effects that you should report to your doctor or health care professional as soon as possible: -allergic reactions like skin rash, itching or hives, swelling of the face, lips, or tongue -dizziness -fever -pain, redness, or irritation at site where injected -pinpoint red spots on the skin -shortness of breath or breathing problems -stomach or side pain, or pain at the shoulder -swelling -tiredness -trouble passing urine Side effects that usually do not require medical attention (report to your doctor   or health care professional if they continue or are bothersome): -bone pain -muscle pain This list may not describe all possible side effects. Call your doctor for medical advice about side effects. You may report side effects to FDA at  1-800-FDA-1088. Where should I keep my medicine? Keep out of the reach of children. Store pre-filled syringes in a refrigerator between 2 and 8 degrees C (36 and 46 degrees F). Do not freeze. Keep in carton to protect from light. Throw away this medicine if it is left out of the refrigerator for more than 48 hours. Throw away any unused medicine after the expiration date. NOTE: This sheet is a summary. It may not cover all possible information. If you have questions about this medicine, talk to your doctor, pharmacist, or health care provider.  2015, [employer]. (2013-07-01 16:14:05)  

## 2014-02-03 ENCOUNTER — Telehealth: Payer: Self-pay | Admitting: *Deleted

## 2014-02-03 ENCOUNTER — Other Ambulatory Visit: Payer: Self-pay | Admitting: Nurse Practitioner

## 2014-02-03 NOTE — Telephone Encounter (Signed)
Pt's son and caregiver - [name] - inquired about obtaining a letter for reimbursement of flight tickets for him and his wife - purchased prior to father's diagnosis.  Above was a family trip that is being cancelled due to family's need to assist with care of father as well as care they have been providing for their mother with debilitating health care. 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  Please address to " to whom it may concern ".

## 2014-02-07 ENCOUNTER — Other Ambulatory Visit: Payer: Self-pay | Admitting: *Deleted

## 2014-02-07 MED ORDER — GLUCOSE BLOOD VI STRP: ORAL_STRIP | Status: DC

## 2014-02-08 ENCOUNTER — Other Ambulatory Visit (HOSPITAL_BASED_OUTPATIENT_CLINIC_OR_DEPARTMENT_OTHER): Payer: Medicare Other

## 2014-02-08 ENCOUNTER — Encounter: Payer: Self-pay | Admitting: Nurse Practitioner

## 2014-02-08 ENCOUNTER — Ambulatory Visit (HOSPITAL_BASED_OUTPATIENT_CLINIC_OR_DEPARTMENT_OTHER): Payer: Medicare Other | Admitting: Nurse Practitioner

## 2014-02-08 ENCOUNTER — Encounter: Payer: Self-pay | Admitting: *Deleted

## 2014-02-08 VITALS — BP 166/66 | HR 84 | Temp 98.1°F | Resp 18 | Ht 67.0 in | Wt 168.8 lb

## 2014-02-08 DIAGNOSIS — C8330 Diffuse large B-cell lymphoma, unspecified site: Secondary | ICD-10-CM

## 2014-02-08 DIAGNOSIS — C8590 Non-Hodgkin lymphoma, unspecified, unspecified site: Secondary | ICD-10-CM

## 2014-02-08 DIAGNOSIS — D649 Anemia, unspecified: Secondary | ICD-10-CM | POA: Insufficient documentation

## 2014-02-08 DIAGNOSIS — R21 Rash and other nonspecific skin eruption: Secondary | ICD-10-CM

## 2014-02-08 LAB — CBC WITH DIFFERENTIAL/PLATELET
BASO%: 1.6 % (ref 0.0–2.0)
Basophils Absolute: 0 10*3/uL (ref 0.0–0.1)
EOS%: 1.2 % (ref 0.0–7.0)
Eosinophils Absolute: 0 10*3/uL (ref 0.0–0.5)
HCT: 30.1 % — ABNORMAL LOW (ref 38.4–49.9)
HGB: 9.9 g/dL — ABNORMAL LOW (ref 13.0–17.1)
LYMPH%: 32.5 % (ref 14.0–49.0)
MCH: 27.2 pg (ref 27.2–33.4)
MCHC: 32.9 g/dL (ref 32.0–36.0)
MCV: 82.7 fL (ref 79.3–98.0)
MONO#: 0.3 10*3/uL (ref 0.1–0.9)
MONO%: 12.9 % (ref 0.0–14.0)
NEUT#: 1.3 10*3/uL — ABNORMAL LOW (ref 1.5–6.5)
NEUT%: 51.8 % (ref 39.0–75.0)
Platelets: 122 10*3/uL — ABNORMAL LOW (ref 140–400)
RBC: 3.64 10*6/uL — ABNORMAL LOW (ref 4.20–5.82)
RDW: 14 % (ref 11.0–14.6)
WBC: 2.6 10*3/uL — ABNORMAL LOW (ref 4.0–10.3)
lymph#: 0.8 10*3/uL — ABNORMAL LOW (ref 0.9–3.3)

## 2014-02-08 MED ORDER — MICONAZOLE NITRATE 2 % EX CREA
1.0000 | TOPICAL_CREAM | Freq: Two times a day (BID) | CUTANEOUS | Status: DC
Start: 2014-02-08 — End: 2014-05-10

## 2014-02-08 MED ORDER — NYSTATIN 100000 UNIT/GM EX CREA: 1.0000 "application " | TOPICAL_CREAM | Freq: Two times a day (BID) | CUTANEOUS | Status: DC

## 2014-02-08 NOTE — Progress Notes (Signed)
ID: Erik Ramirez OB: August 05, 1925  Erik#: [id number]  CSN#:[id number]  PCP: Erik Channel, MD GYN:   SU:  OTHER MD:  Erik Ramirez: large B-cell lymphoma CURRENT THERAPY: "mini-CHOP" and rituxan  HISTORY OF PRESENT ILLNESS: Erik Erik Ramirez developed abdominal pain and distension sometime mid August 2015. He thought this was diverticular disease. Eventually he brought these concerns to Erik [facility] and per patient's report and chart review a CT of the abd/pelvis was obtained which showed significant adenopathy. Biopsy 12/13/2013 at [facility] reportedly showed a large cell, B-cell lymphoma. The patient has developed "B" symptoms including unexplained fatigue, fevers and drenching sweats. He presented to [facility] 01/05/2014 with worsening shortness of breath. Workup here included a chest CT 01/05/2014 which showed diffuse upper abdominal and mediastinal adenopathy, with bilateral pleural effusions. V/Q scan was low probability for PE and bilateral LE dopples were negative for DVT. Echo 01/06/2014 showed an EF or 55-60%. He developed A-fib 01/06/2014 and has been started on cardizem. Other problems include acute renal injury, DM, HTN, dyslipidemia with atherosclerosis noted on CT scans and OSA requiring norcturnal C-pap.  INTERVAL HISTORY:  Erik Ramirez returns to clinic for follow up of his newly diagnosed b-cell lymphoma, accompanied by his son, Erik Ramirez. Today is receive cycle 2 day 8 of R-CHOP therapy.  He receives CHOP on day 1 and Rituximab on day 2, with neulasta on day 3 for granulocyte support.  He receives this on a 21 day cycle.    He tolerated treatment this past week moderately well. He felt warm while the rituxan was infusing, but had no acute reaction and was able to restart the drip at the same pace after a break.  REVIEW OF SYSTEMS: Erik Ramirez is doing well today. He denies fevers, chills, nausea, or vomiting. He manages his constipation with miralax and stool softeners daily. His appetite and  nutrition has been difficult to manage. He continues to drink at least 2 high protein shakes per day. He has some occasional fatigue and weakness especially while bathing. He continues to work with a physical therapist twice weekly to regain his strength. He is in the process of acquiring a shower chair. He uses the nystatin mouthwash frequently and has had no mouth sores. He has a groin rash that does not itch or hurt necessarily, but becomes irritated especially while washing himself. A detailed review of systems is otherwise noncontributory.    PAST MEDICAL HISTORY: Past Medical History  Diagnosis Date  . Hypertension   . Hyperlipemia   . Anxiety   . Lymphadenopathy, abdominal   . Urethral stricture   . Anemia   . BPPV (benign paroxysmal positional vertigo)   . Lymphoma   . Diabetes mellitus without complication   . Obstructive sleep apnea     PAST SURGICAL HISTORY: Past Surgical History  Procedure Laterality Date  . Back surgery    . Prostate surgery    . Tonsillectomy      FAMILY HISTORY History reviewed. No pertinent family history.  Patient's father had a "muscle disease" and died from unclear causes age 51. Patient's mother died from heartproblems age 69. The patient had 1 brother, 5 sisters. 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 He and his wife Erik Ramirez have ben married 56 years. Son Erik Ramirez lives in [place] and works in Press photographer. Son Erik Ramirez lives in [place] and is retired (disabled). Son Erik Ramirez runs a maintenance shop in  , where he lives. Son Erik Ramirez works in telephones in [place]. Erik Ramirez's wife, Erik Ramirez, is an Therapist, sports and worked for [employer] many years. She now works for [employer].The patient has 8 gch and 6 ggch. He is a Psychologist, forensic    ADVANCED DIRECTIVES: in place. Erik Ramirez tells me "if they can't do anything for me" he would not want resuscitation or life support. His  wife Erik Ramirez is his health care POA, with son Erik Ramirez listed second.    HEALTH MAINTENANCE: History  Substance Use Topics  . Smoking status: Never Smoker   . Smokeless tobacco: Not on file  . Alcohol Use: No     Allergies  Allergen Reactions  . Rituximab Other (See Comments)    Developed wheezing & rigors when infusion rate increased to 100 mg/hr.  Tolerates rate of 50 mg/hr.  . Caffeine Other (See Comments)    Makes patient feel like he's going to die  . Morphine And Related Other (See Comments)    excitability  . Penicillins Rash    Current Outpatient Prescriptions  Medication Sig Dispense Refill  . allopurinol (ZYLOPRIM) 300 MG tablet Take 1 tablet (300 mg total) by mouth daily.  90 tablet  1  . aspirin 325 MG tablet Take 1 tablet (325 mg total) by mouth daily.  30 tablet  0  . clorazepate (TRANXENE) 7.5 MG tablet Take 7.5 mg by mouth 2 (two) times daily as needed for anxiety or sleep.      Marland Kitchen diltiazem (CARDIZEM CD) 120 MG 24 hr capsule Take 1 capsule (120 mg total) by mouth daily.  90 capsule  1  . feeding supplement, ENSURE COMPLETE, (ENSURE COMPLETE) LIQD Take 237 mLs by mouth daily.  20 Bottle  1  . glucose blood test strip ONE TOUCH ULTRA .CHECK BLOOD SUGAR AC AND HS DAILY  480 each  3  . insulin aspart (NOVOLOG) 100 UNIT/ML injection Inject 0-20 Units into the skin 3 (three) times daily with meals.  10 mL  11  . lidocaine-prilocaine (EMLA) cream Apply 1 application topically as needed. APPLY TO PORT SITE 1 HOUR PRIOR TO THERAPY. DO NOT RUB IN. COVER WITH SARAN WRAP.  30 g  2  . metoprolol (LOPRESSOR) 50 MG tablet Take 25 mg by mouth 2 (two) times daily.        . naproxen (NAPROSYN) 375 MG tablet Take 1 tablet (375 mg total) by mouth 3 (three) times daily with meals.  20 tablet  0  . nystatin (MYCOSTATIN) 100000 UNIT/ML suspension SWISH AND SWALLOW 5 MLS BY MOUTH FOUR TIMES DAILY  240 mL  0  . ondansetron (ZOFRAN) 8 MG tablet Take 1 tablet (8 mg total) by mouth every 12  (twelve) hours.  20 tablet  0  . ondansetron (ZOFRAN) 8 MG tablet Take 8 mg by mouth 2 (two) times daily as needed for nausea or vomiting.      . simvastatin (ZOCOR) 20 MG tablet Take 20 mg by mouth daily.      . traMADol (ULTRAM) 50 MG tablet Take 50 mg by mouth every 6 (six) hours as needed for moderate pain.      . ciprofloxacin (CIPRO) 500 MG tablet Take 1 tablet (500 mg total) by mouth 2 (two) times daily.  10 tablet  0  . clotrimazole-betamethasone (LOTRISONE) cream Apply 1 application topically 2 (two) times daily.      Marland Kitchen nystatin cream (MYCOSTATIN) Apply 1 application topically 2 (two) times daily. Apply to affected areas in  groin  30 g  0   No current facility-administered medications for this visit.    OBJECTIVE: Filed Vitals:   02/08/14 0933  BP: 166/66  Pulse: 84  Temp: 98.1 F (36.7 C)  Resp: 18     Body mass index is 26.43 kg/(m^2).   ROS   Physical Exam Skin: erythematous macular rash to groin area with central clearing, suggestive of fungal infection HEENT: sclerae anicteric, conjunctivae pink, oropharynx clear. No thrush or mucositis.  Lymph Nodes: No cervical or supraclavicular lymphadenopathy  Lungs: clear to auscultation bilaterally, no rales, wheezes, or rhonci  Heart: regular rate and rhythm  Abdomen: round, soft, non tender, positive bowel sounds  Musculoskeletal: No focal spinal tenderness, no peripheral edema  Neuro: non focal, well oriented, positive affect   ECOG FS:1 - Symptomatic but completely ambulatory  LAB RESULTS:   Chemistry      Component Value Date/Time   NA 138 01/31/2014 0811   NA 137 01/17/2014 0021   K 4.0 01/31/2014 0811   K 3.9 01/17/2014 0021   CL 98 01/17/2014 0021   CO2 27 01/31/2014 0811   CO2 24 01/12/2014 0418   BUN 21.6 01/31/2014 0811   BUN 22 01/17/2014 0021   CREATININE 1.1 01/31/2014 0811   CREATININE 0.90 01/17/2014 0021      Component Value Date/Time   CALCIUM 8.8 01/31/2014 0811   CALCIUM 7.9* 01/12/2014 0418    ALKPHOS 64 01/31/2014 0811   ALKPHOS 100 01/12/2014 0418   AST 22 01/31/2014 0811   AST 29 01/12/2014 0418   ALT 18 01/31/2014 0811   ALT 31 01/12/2014 0418   BILITOT 0.50 01/31/2014 0811   BILITOT 0.6 01/12/2014 0418     Lab Results  Component Value Date   WBC 2.6* 02/08/2014   HGB 9.9* 02/08/2014   HCT 30.1* 02/08/2014   MCV 82.7 02/08/2014   PLT 122* 02/08/2014    Urinalysis    Component Value Date/Time   COLORURINE YELLOW 01/17/2014 0029   APPEARANCEUR CLEAR 01/17/2014 0029   LABSPEC 1.019 01/17/2014 0029   PHURINE 5.5 01/17/2014 0029   GLUCOSEU NEGATIVE 01/17/2014 0029   HGBUR NEGATIVE 01/17/2014 0029   BILIRUBINUR NEGATIVE 01/17/2014 0029   KETONESUR NEGATIVE 01/17/2014 0029   PROTEINUR NEGATIVE 01/17/2014 0029   UROBILINOGEN 1.0 01/17/2014 0029   NITRITE NEGATIVE 01/17/2014 0029   LEUKOCYTESUR TRACE* 01/17/2014 0029    STUDIES: Dg Chest 2 View  01/11/2014   CLINICAL DATA:  Shortness of breath.  Follow-up effusions.  EXAM: CHEST  2 VIEW  COMPARISON:  01/09/2014  FINDINGS: Heart is upper limits normal in size. Bilateral pleural effusions noted, left greater than right, similar to prior study. Bibasilar atelectasis. No acute bony abnormality.  IMPRESSION: Stable bilateral effusions, left slightly greater than right. Bibasilar atelectasis.   Electronically Signed   By: Rolm Baptise M.D.   On: 01/11/2014 12:04   Dg Chest 2 View  01/09/2014   CLINICAL DATA:  Pleural effusion.  EXAM: CHEST  2 VIEW  COMPARISON:  January 08, 2014.  FINDINGS: Stable cardiomediastinal silhouette. No pneumothorax is noted. Minimal right pleural effusion is noted. Otherwise right lung is clear. Stable mild left pleural effusion is noted with possible underlying atelectasis.  IMPRESSION: Stable bilateral pleural effusions with left greater than right.   Electronically Signed   By: Sabino Dick M.D.   On: 01/09/2014 15:46   Ir Fluoro Guide Cv Line Right  01/12/2014   INDICATION: History lymphoma. In need of  intravenous access  for chemotherapy administration  EXAM: IMPLANTED PORT A CATH PLACEMENT WITH ULTRASOUND AND FLUOROSCOPIC GUIDANCE  COMPARISON:  None.  MEDICATIONS: Vancomycin 1 gm IV; The antibiotic was administered within an appropriate time interval prior to skin puncture.  ANESTHESIA/SEDATION: Versed 1 mg IV; Fentanyl 50 mcg IV;  Total Moderate Sedation Time  30  minutes.  CONTRAST:  None  FLUOROSCOPY TIME:  24 seconds.  COMPLICATIONS: None immediate  PROCEDURE: The procedure, risks, benefits, and alternatives were explained to the patient. Questions regarding the procedure were encouraged and answered. The patient understands and consents to the procedure.  The right neck and chest were prepped with chlorhexidine in a sterile fashion, and a sterile drape was applied covering the operative field. Maximum barrier sterile technique with sterile gowns and gloves were used for the procedure. A timeout was performed prior to the initiation of the procedure. Local anesthesia was provided with 1% lidocaine with epinephrine.  After creating a small venotomy incision, a micropuncture kit was utilized to access the internal jugular vein under direct, real-time ultrasound guidance. Ultrasound image documentation was performed. The microwire was kinked to measure appropriate catheter length.  A subcutaneous port pocket was then created along the upper chest wall utilizing a combination of sharp and blunt dissection. The pocket was irrigated with sterile saline. A single lumen ISP power injectable port was chosen for placement. The 8 Fr catheter was tunneled from the port pocket site to the venotomy incision. The port was placed in the pocket. The external catheter was trimmed to appropriate length. At the venotomy, an 8 Fr peel-away sheath was placed over a guidewire under fluoroscopic guidance. The catheter was then placed through the sheath and the sheath was removed. Final catheter positioning was confirmed and  documented with a fluoroscopic spot radiograph. The port was accessed with a Huber needle, aspirated and flushed with heparinized saline.  The venotomy site was closed with an interrupted 4-0 Vicryl suture. The port pocket incision was closed with interrupted 2-0 Vicryl suture and the skin was opposed with a running subcuticular 4-0 Vicryl suture. Dermabond and Steri-strips were applied to both incisions. Dressings were placed. The patient tolerated the procedure well without immediate post procedural complication.  FINDINGS: After catheter placement, the tip lies within the superior cavoatrial junction. The catheter aspirates and flushes normally and is ready for immediate use.  IMPRESSION: Successful placement of a right internal jugular approach power injectable Port-A-Cath. The catheter is ready for immediate use.   Electronically Signed   By: Sandi Mariscal M.D.   On: 01/12/2014 09:49   Ir US Guide Vasc Access Right  01/12/2014   INDICATION: History lymphoma. In need of intravenous access for chemotherapy administration  EXAM: IMPLANTED PORT A CATH PLACEMENT WITH ULTRASOUND AND FLUOROSCOPIC GUIDANCE  COMPARISON:  None.  MEDICATIONS: Vancomycin 1 gm IV; The antibiotic was administered within an appropriate time interval prior to skin puncture.  ANESTHESIA/SEDATION: Versed 1 mg IV; Fentanyl 50 mcg IV;  Total Moderate Sedation Time  30  minutes.  CONTRAST:  None  FLUOROSCOPY TIME:  24 seconds.  COMPLICATIONS: None immediate  PROCEDURE: The procedure, risks, benefits, and alternatives were explained to the patient. Questions regarding the procedure were encouraged and answered. The patient understands and consents to the procedure.  The right neck and chest were prepped with chlorhexidine in a sterile fashion, and a sterile drape was applied covering the operative field. Maximum barrier sterile technique with sterile gowns and gloves were used for the procedure. A timeout was performed  prior to the initiation of  the procedure. Local anesthesia was provided with 1% lidocaine with epinephrine.  After creating a small venotomy incision, a micropuncture kit was utilized to access the internal jugular vein under direct, real-time ultrasound guidance. Ultrasound image documentation was performed. The microwire was kinked to measure appropriate catheter length.  A subcutaneous port pocket was then created along the upper chest wall utilizing a combination of sharp and blunt dissection. The pocket was irrigated with sterile saline. A single lumen ISP power injectable port was chosen for placement. The 8 Fr catheter was tunneled from the port pocket site to the venotomy incision. The port was placed in the pocket. The external catheter was trimmed to appropriate length. At the venotomy, an 8 Fr peel-away sheath was placed over a guidewire under fluoroscopic guidance. The catheter was then placed through the sheath and the sheath was removed. Final catheter positioning was confirmed and documented with a fluoroscopic spot radiograph. The port was accessed with a Huber needle, aspirated and flushed with heparinized saline.  The venotomy site was closed with an interrupted 4-0 Vicryl suture. The port pocket incision was closed with interrupted 2-0 Vicryl suture and the skin was opposed with a running subcuticular 4-0 Vicryl suture. Dermabond and Steri-strips were applied to both incisions. Dressings were placed. The patient tolerated the procedure well without immediate post procedural complication.  FINDINGS: After catheter placement, the tip lies within the superior cavoatrial junction. The catheter aspirates and flushes normally and is ready for immediate use.  IMPRESSION: Successful placement of a right internal jugular approach power injectable Port-A-Cath. The catheter is ready for immediate use.   Electronically Signed   By: Sandi Mariscal M.D.   On: 01/12/2014 09:49   Dg Abd Acute W/chest  01/17/2014   CLINICAL DATA:  Sudden  onset of fever late last night, and diarrhea several hours ago. Generalized abdominal discomfort. Patient on chemotherapy for current history of lymphoma. Initial encounter.  EXAM: ACUTE ABDOMEN SERIES (ABDOMEN 2 VIEW & CHEST 1 VIEW)  COMPARISON:  Chest radiograph performed 01/11/2014  FINDINGS: The lungs are well-aerated. A small left pleural effusion is noted. Pulmonary vascularity is at the upper limits of normal. No pneumothorax is seen. The cardiomediastinal silhouette is within normal limits. A right-sided chest port is noted ending about the mid SVC.  The visualized bowel gas pattern is unremarkable. Scattered stool and air are seen within the colon; there is no evidence of small bowel dilatation to suggest obstruction. No free intra-abdominal air is identified on the provided upright view.  No acute osseous abnormalities are seen; the sacroiliac joints are unremarkable in appearance.  IMPRESSION: 1. Unremarkable bowel gas pattern; no free intra-abdominal air seen. 2. Small left pleural effusion noted. Lungs otherwise grossly clear.   Electronically Signed   By: Garald Balding M.D.   On: 01/17/2014 01:26    ASSESSMENT: 80 y.o.. [name] with a new diagnosis of diffuse large-cell, B-cell non-Hodgkin's lymphoma   (1) rituximab started 01/07/2014, with initial infusion reaction but able to receive entire dose that evening; to be repeated every 21 days  (2) dose-reduced CHOP started 01/10/2014--to be repeated every 21 days.  Patient has h/o rituximab reactions and will receive 115m of Rituximab on day 1, in addition to slow insfusion on day 2.  The plan is to re-stage Erik. Damaso after three cycles of treatment.    (3) tumor lysis syndrome while hospitalized: very favorable initial reaction to treatment; LDH, uric acid normalizing  (4) AKI  while hospitalized: creatinine has normalized   PLAN:  Tibor is doing well today. The CBC was reviewed in detail and shows treatment related pancytopenias.  He is afebrile at this time. He received neulasta last Wednesday and his ANC is expected to rebound in the next few days. He will continue to push proteins in his diet and will be as active as possible at home to maintain his strength. I have written for nystain cream to apply to his groin twice daily for the rash.   Donal will return in 2 weeks for labs and a visit with Erik. Jana Hakim prior to the start of cycle 3. He understands and agrees with this plan. He has been encouraged to call with any issues that might arise before his next visit here.   Marcelino Duster, [place] [phone] 02/08/2014 10:10 AM

## 2014-02-21 ENCOUNTER — Ambulatory Visit (HOSPITAL_BASED_OUTPATIENT_CLINIC_OR_DEPARTMENT_OTHER): Payer: Medicare Other | Admitting: Oncology

## 2014-02-21 ENCOUNTER — Ambulatory Visit (HOSPITAL_BASED_OUTPATIENT_CLINIC_OR_DEPARTMENT_OTHER): Payer: No Typology Code available for payment source

## 2014-02-21 ENCOUNTER — Other Ambulatory Visit: Payer: Self-pay | Admitting: Oncology

## 2014-02-21 ENCOUNTER — Other Ambulatory Visit: Payer: Medicare Other

## 2014-02-21 ENCOUNTER — Telehealth: Payer: Self-pay | Admitting: *Deleted

## 2014-02-21 ENCOUNTER — Other Ambulatory Visit (HOSPITAL_BASED_OUTPATIENT_CLINIC_OR_DEPARTMENT_OTHER): Payer: Medicare Other

## 2014-02-21 ENCOUNTER — Telehealth: Payer: Self-pay | Admitting: Oncology

## 2014-02-21 VITALS — BP 148/82 | HR 84 | Temp 97.8°F | Resp 18 | Ht 67.0 in | Wt 165.7 lb

## 2014-02-21 DIAGNOSIS — D630 Anemia in neoplastic disease: Secondary | ICD-10-CM

## 2014-02-21 DIAGNOSIS — C8330 Diffuse large B-cell lymphoma, unspecified site: Secondary | ICD-10-CM | POA: Diagnosis not present

## 2014-02-21 DIAGNOSIS — Z5112 Encounter for antineoplastic immunotherapy: Secondary | ICD-10-CM

## 2014-02-21 DIAGNOSIS — Z5111 Encounter for antineoplastic chemotherapy: Secondary | ICD-10-CM | POA: Diagnosis not present

## 2014-02-21 DIAGNOSIS — E883 Tumor lysis syndrome: Secondary | ICD-10-CM

## 2014-02-21 DIAGNOSIS — N183 Chronic kidney disease, stage 3 unspecified: Secondary | ICD-10-CM

## 2014-02-21 DIAGNOSIS — C8590 Non-Hodgkin lymphoma, unspecified, unspecified site: Secondary | ICD-10-CM

## 2014-02-21 LAB — CBC WITH DIFFERENTIAL/PLATELET
BASO%: 1.2 % (ref 0.0–2.0)
BASOS ABS: 0.1 10*3/uL (ref 0.0–0.1)
EOS%: 0.4 % (ref 0.0–7.0)
Eosinophils Absolute: 0 10*3/uL (ref 0.0–0.5)
HCT: 32.3 % — ABNORMAL LOW (ref 38.4–49.9)
HGB: 10.7 g/dL — ABNORMAL LOW (ref 13.0–17.1)
LYMPH#: 1.7 10*3/uL (ref 0.9–3.3)
LYMPH%: 17.3 % (ref 14.0–49.0)
MCH: 28 pg (ref 27.2–33.4)
MCHC: 33 g/dL (ref 32.0–36.0)
MCV: 84.9 fL (ref 79.3–98.0)
MONO#: 0.9 10*3/uL (ref 0.1–0.9)
MONO%: 9.7 % (ref 0.0–14.0)
NEUT#: 6.8 10*3/uL — ABNORMAL HIGH (ref 1.5–6.5)
NEUT%: 71.4 % (ref 39.0–75.0)
Platelets: 237 10*3/uL (ref 140–400)
RBC: 3.81 10*6/uL — ABNORMAL LOW (ref 4.20–5.82)
RDW: 17.8 % — AB (ref 11.0–14.6)
WBC: 9.6 10*3/uL (ref 4.0–10.3)

## 2014-02-21 LAB — COMPREHENSIVE METABOLIC PANEL (CC13)
ALT: 17 U/L (ref 0–55)
AST: 22 U/L (ref 5–34)
Albumin: 3 g/dL — ABNORMAL LOW (ref 3.5–5.0)
Alkaline Phosphatase: 73 U/L (ref 40–150)
Anion Gap: 9 mEq/L (ref 3–11)
BUN: 27 mg/dL — ABNORMAL HIGH (ref 7.0–26.0)
CO2: 27 mEq/L (ref 22–29)
CREATININE: 1 mg/dL (ref 0.7–1.3)
Calcium: 9.2 mg/dL (ref 8.4–10.4)
Chloride: 103 mEq/L (ref 98–109)
Glucose: 113 mg/dl (ref 70–140)
POTASSIUM: 4 meq/L (ref 3.5–5.1)
Sodium: 138 mEq/L (ref 136–145)
TOTAL PROTEIN: 6.5 g/dL (ref 6.4–8.3)
Total Bilirubin: 0.52 mg/dL (ref 0.20–1.20)

## 2014-02-21 LAB — LACTATE DEHYDROGENASE (CC13): LDH: 206 U/L (ref 125–245)

## 2014-02-21 LAB — URIC ACID (CC13): Uric Acid, Serum: 3 mg/dl (ref 2.6–7.4)

## 2014-02-21 MED ORDER — ONDANSETRON 16 MG/50ML IVPB (CHCC)
16.0000 mg | Freq: Once | INTRAVENOUS | Status: AC
  Administered 2014-02-21: 16 mg via INTRAVENOUS

## 2014-02-21 MED ORDER — DOXORUBICIN HCL CHEMO IV INJECTION 2 MG/ML
25.0000 mg/m2 | Freq: Once | INTRAVENOUS | Status: AC
  Administered 2014-02-21: 50 mg via INTRAVENOUS
  Filled 2014-02-21: qty 25

## 2014-02-21 MED ORDER — HEPARIN SOD (PORK) LOCK FLUSH 100 UNIT/ML IV SOLN
500.0000 [IU] | Freq: Once | INTRAVENOUS | Status: AC | PRN
  Administered 2014-02-21: 500 [IU]
  Filled 2014-02-21: qty 5

## 2014-02-21 MED ORDER — DIPHENHYDRAMINE HCL 25 MG PO CAPS
ORAL_CAPSULE | ORAL | Status: AC
  Filled 2014-02-21: qty 2

## 2014-02-21 MED ORDER — FAMOTIDINE IN NACL 20-0.9 MG/50ML-% IV SOLN
20.0000 mg | Freq: Two times a day (BID) | INTRAVENOUS | Status: DC
  Administered 2014-02-21: 20 mg via INTRAVENOUS

## 2014-02-21 MED ORDER — SODIUM CHLORIDE 0.9 % IV SOLN
Freq: Once | INTRAVENOUS | Status: AC
  Administered 2014-02-21: 10:00:00 via INTRAVENOUS

## 2014-02-21 MED ORDER — SODIUM CHLORIDE 0.9 % IV SOLN
400.0000 mg/m2 | Freq: Once | INTRAVENOUS | Status: AC
  Administered 2014-02-21: 800 mg via INTRAVENOUS
  Filled 2014-02-21: qty 40

## 2014-02-21 MED ORDER — DEXAMETHASONE SODIUM PHOSPHATE 20 MG/5ML IJ SOLN
20.0000 mg | Freq: Once | INTRAMUSCULAR | Status: AC
Start: 2014-02-21 — End: 2014-02-21
  Administered 2014-02-21: 20 mg via INTRAVENOUS

## 2014-02-21 MED ORDER — ACETAMINOPHEN 325 MG PO TABS
ORAL_TABLET | ORAL | Status: AC
  Filled 2014-02-21: qty 1

## 2014-02-21 MED ORDER — ACETAMINOPHEN 325 MG PO TABS
ORAL_TABLET | ORAL | Status: AC
  Filled 2014-02-21: qty 2

## 2014-02-21 MED ORDER — DEXAMETHASONE SODIUM PHOSPHATE 20 MG/5ML IJ SOLN
INTRAMUSCULAR | Status: AC
  Filled 2014-02-21: qty 5

## 2014-02-21 MED ORDER — DIPHENHYDRAMINE HCL 25 MG PO CAPS
25.0000 mg | ORAL_CAPSULE | Freq: Once | ORAL | Status: AC
  Administered 2014-02-21: 25 mg via ORAL

## 2014-02-21 MED ORDER — PREDNISONE 20 MG PO TABS: 60.0000 mg | ORAL_TABLET | Freq: Every day | ORAL | Status: DC

## 2014-02-21 MED ORDER — ACETAMINOPHEN 325 MG PO TABS
650.0000 mg | ORAL_TABLET | Freq: Once | ORAL | Status: AC
  Administered 2014-02-21: 650 mg via ORAL

## 2014-02-21 MED ORDER — VINCRISTINE SULFATE CHEMO INJECTION 1 MG/ML
1.0000 mg | Freq: Once | INTRAVENOUS | Status: AC
  Administered 2014-02-21: 1 mg via INTRAVENOUS
  Filled 2014-02-21: qty 1

## 2014-02-21 MED ORDER — SODIUM CHLORIDE 0.9 % IV SOLN
700.0000 mg | Freq: Once | INTRAVENOUS | Status: AC
  Administered 2014-02-21: 700 mg via INTRAVENOUS
  Filled 2014-02-21: qty 70

## 2014-02-21 MED ORDER — SODIUM CHLORIDE 0.9 % IJ SOLN
10.0000 mL | INTRAMUSCULAR | Status: DC | PRN
  Administered 2014-02-21: 10 mL
  Filled 2014-02-21: qty 10

## 2014-02-21 MED ORDER — FLUCONAZOLE 100 MG PO TABS: 100.0000 mg | ORAL_TABLET | Freq: Every day | ORAL | Status: DC

## 2014-02-21 MED ORDER — CLORAZEPATE DIPOTASSIUM 7.5 MG PO TABS: 7.5000 mg | ORAL_TABLET | Freq: Two times a day (BID) | ORAL | Status: DC | PRN

## 2014-02-21 MED ORDER — FAMOTIDINE IN NACL 20-0.9 MG/50ML-% IV SOLN
INTRAVENOUS | Status: AC
  Filled 2014-02-21: qty 50

## 2014-02-21 MED ORDER — ONDANSETRON 16 MG/50ML IVPB (CHCC)
INTRAVENOUS | Status: AC
  Filled 2014-02-21: qty 16

## 2014-02-21 NOTE — Progress Notes (Signed)
[place]  [phone] Fax:([phone]) [phone]     ID: Erik Ramirez DOB: 05/11/1925  Erik#: [id number]  XIH#:[id number]  Patient Care Team: Valaria Good. [name], MD as PCP - General (Internal Medicine) OTHER MD:  CHIEF COMPLAINT: large cell diffuse B-cell non-Hodgkin's lymphoma  CURRENT TREATMENT: dose reduced CHOP/rituximab   HISTORY OF PRESENT ILLNESS: From the original intake note:  "Erik Ramirez developed abdominal pain and distension sometime mid August 2015. He thought this was diverticular disease. Eventually he brought these concerns to Dr [name] and per patient's report and chart review a CT of the abd/pelvis was obtained which showed significant adenopathy. Biopsy 12/13/2013 at [facility] reportedly showed a large cell, B-cell lymphoma. The patient has developed "B" symptoms including unexplained fatigue, fevers and drenching sweats. He presented to [facility]2014 with worsening shortness of breath. Workup here included a chest CT 01/05/2014 which showed diffuse upper abdominal and mediastinal adenopathy, with bilateral pleural effusions. V/Q scan was low probability for PE and bilateral LE dopples were negative for DVT. Echo 01/06/2014 showed an EF or 55-60%. He developed A-fib 01/06/2014 and has been started on cardizem. Other problems include acute renal injury, DM, HTN, dyslipidemia with atherosclerosis noted on CT scans and OSA requiring norcturnal C-pap"  The patient's subsequent history is as detailed below  INTERVAL HISTORY: Erik. Ramirez returns today accompanied by his son [name] for follow-up of Erik. Ramirez' non-Hodgkin's lymphoma. Today is day 1 cycle 3 of a minimum of 6 planned cycles of treatment  REVIEW OF SYSTEMS: The patient is doing remarkably well with chemotherapy. He is actually not needing any of his nausea medicines at home. He tells me his sugars have been no higher than 155. His appetite is better, and he is keeping his weight  up with the use of various supplements including protein supplements and "vegetable supplements". He does have some taste perversion. Bowel movements are regular with the help of mineral accident Colace. He is urine flow is much better, he thinks because his adenopathy has decreased. He is not going outdoors because he is afraid of catching a cold, but walks within the house at least 10 minutes twice a day. He does describe himself as moderately to severely fatigued. He has problems with his hearing, which are not new. He has mild sinus problems but no cough or worsening shortness of breath. Occasionally his ankles swell. A detailed review of systems today was otherwise stable  PAST MEDICAL HISTORY: Past Medical History  Diagnosis Date  . Hypertension   . Hyperlipemia   . Anxiety   . Lymphadenopathy, abdominal   . Urethral stricture   . Anemia   . BPPV (benign paroxysmal positional vertigo)   . Lymphoma   . Diabetes mellitus without complication   . Obstructive sleep apnea     PAST SURGICAL HISTORY: Past Surgical History  Procedure Laterality Date  . Back surgery    . Prostate surgery    . Tonsillectomy      FAMILY HISTORY No family history on file. Patient's father had a "muscle disease" and died from unclear causes age 67. Patient's mother died from heartproblems age 39. The patient had 1 brother, 5 sisters. 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 He and his wife [name] have ben married 52 years. Son Cleophas lives in [place] and works in Press photographer. Son [name] lives in [place] and is retired Physicist, medical  disabled). Son [name] runs a maintenance shop in [place], where he lives. Son [name] works in telephones in [place]. [name]'s wife, [name], is an Therapist, sports and worked for [employer] many years. She now works for [employer].The patient has 8 gch and 6 ggch. He is a Psychologist, forensic    ADVANCED DIRECTIVES: place. Erik  Ramirez tells me "if they can't do anything for me" he would not want resuscitation or life support. His wife [name] is his health care POA, with son [name] listed second.   HEALTH MAINTENANCE: History  Substance Use Topics  . Smoking status: Never Smoker   . Smokeless tobacco: Not on file  . Alcohol Use: No     Colonoscopy:  PAP:  Bone density:  Lipid panel:  Allergies  Allergen Reactions  . Rituximab Other (See Comments)    Developed wheezing & rigors when infusion rate increased to 100 mg/hr.  Tolerates rate of 50 mg/hr.  . Caffeine Other (See Comments)    Makes patient feel like he's going to die  . Morphine And Related Other (See Comments)    excitability  . Penicillins Rash    Current Outpatient Prescriptions  Medication Sig Dispense Refill  . allopurinol (ZYLOPRIM) 300 MG tablet Take 1 tablet (300 mg total) by mouth daily. 90 tablet 1  . aspirin 325 MG tablet Take 1 tablet (325 mg total) by mouth daily. 30 tablet 0  . ciprofloxacin (CIPRO) 500 MG tablet Take 1 tablet (500 mg total) by mouth 2 (two) times daily. 10 tablet 0  . clorazepate (TRANXENE) 7.5 MG tablet Take 7.5 mg by mouth 2 (two) times daily as needed for anxiety or sleep.    . clotrimazole-betamethasone (LOTRISONE) cream Apply 1 application topically 2 (two) times daily.    Marland Kitchen diltiazem (CARDIZEM CD) 120 MG 24 hr capsule Take 1 capsule (120 mg total) by mouth daily. 90 capsule 1  . feeding supplement, ENSURE COMPLETE, (ENSURE COMPLETE) LIQD Take 237 mLs by mouth daily. 20 Bottle 1  . glucose blood test strip ONE TOUCH ULTRA .CHECK BLOOD SUGAR AC AND HS DAILY 480 each 3  . insulin aspart (NOVOLOG) 100 UNIT/ML injection Inject 0-20 Units into the skin 3 (three) times daily with meals. 10 mL 11  . lidocaine-prilocaine (EMLA) cream Apply 1 application topically as needed. APPLY TO PORT SITE 1 HOUR PRIOR TO THERAPY. DO NOT RUB IN. COVER WITH SARAN WRAP. 30 g 2  . metoprolol (LOPRESSOR) 50 MG tablet Take 25 mg by mouth  2 (two) times daily.      . miconazole (MICATIN) 2 % cream Apply 1 application topically 2 (two) times daily. Apply to groin area twice daily until clear 45 g 0  . naproxen (NAPROSYN) 375 MG tablet Take 1 tablet (375 mg total) by mouth 3 (three) times daily with meals. 20 tablet 0  . nystatin (MYCOSTATIN) 100000 UNIT/ML suspension SWISH AND SWALLOW 5 MLS BY MOUTH FOUR TIMES DAILY 240 mL 0  . ondansetron (ZOFRAN) 8 MG tablet Take 1 tablet (8 mg total) by mouth every 12 (twelve) hours. 20 tablet 0  . ondansetron (ZOFRAN) 8 MG tablet Take 8 mg by mouth 2 (two) times daily as needed for nausea or vomiting.    . simvastatin (ZOCOR) 20 MG tablet Take 20 mg by mouth daily.    . traMADol (ULTRAM) 50 MG tablet Take 50 mg by mouth every 6 (six) hours as needed for moderate pain.     No current facility-administered medications for this visit.  OBJECTIVE: elderly white male who appears stated age 79 Vitals:   02/21/14 0830  BP: 148/82  Pulse: 84  Temp: 97.8 F (36.6 C)  Resp: 18     Body mass index is 25.95 kg/(m^2).    ECOG FS:1 - Symptomatic but completely ambulatory  Ocular: Sclerae unicteric, EOMs intact Ear-nose-throat: Oropharynx shows moderate thrush Lymphatic: No cervical or supraclavicular adenopathy Lungs no rales or rhonchi Heart regular rate and rhythm today Abd soft, nontender, positive bowel sounds, no splenomegaly MSK no focal spinal tenderness Neuro: non-focal, well-oriented, appropriate affect  LAB RESULTS:  CMP     Component Value Date/Time   NA 138 01/31/2014 0811   NA 137 01/17/2014 0021   K 4.0 01/31/2014 0811   K 3.9 01/17/2014 0021   CL 98 01/17/2014 0021   CO2 27 01/31/2014 0811   CO2 24 01/12/2014 0418   GLUCOSE 115 01/31/2014 0811   GLUCOSE 116* 01/17/2014 0021   BUN 21.6 01/31/2014 0811   BUN 22 01/17/2014 0021   CREATININE 1.1 01/31/2014 0811   CREATININE 0.90 01/17/2014 0021   CALCIUM 8.8 01/31/2014 0811   CALCIUM 7.9* 01/12/2014 0418   PROT  5.8* 01/31/2014 0811   PROT 5.3* 01/12/2014 0418   ALBUMIN 2.4* 01/31/2014 0811   ALBUMIN 2.1* 01/12/2014 0418   AST 22 01/31/2014 0811   AST 29 01/12/2014 0418   ALT 18 01/31/2014 0811   ALT 31 01/12/2014 0418   ALKPHOS 64 01/31/2014 0811   ALKPHOS 100 01/12/2014 0418   BILITOT 0.50 01/31/2014 0811   BILITOT 0.6 01/12/2014 0418   GFRNONAA 51* 01/12/2014 0418   GFRAA 59* 01/12/2014 0418    INo results found for: SPEP, UPEP  Lab Results  Component Value Date   WBC 9.6 02/21/2014   NEUTROABS 6.8* 02/21/2014   HGB 10.7* 02/21/2014   HCT 32.3* 02/21/2014   MCV 84.9 02/21/2014   PLT 237 02/21/2014      Chemistry      Component Value Date/Time   NA 138 01/31/2014 0811   NA 137 01/17/2014 0021   K 4.0 01/31/2014 0811   K 3.9 01/17/2014 0021   CL 98 01/17/2014 0021   CO2 27 01/31/2014 0811   CO2 24 01/12/2014 0418   BUN 21.6 01/31/2014 0811   BUN 22 01/17/2014 0021   CREATININE 1.1 01/31/2014 0811   CREATININE 0.90 01/17/2014 0021      Component Value Date/Time   CALCIUM 8.8 01/31/2014 0811   CALCIUM 7.9* 01/12/2014 0418   ALKPHOS 64 01/31/2014 0811   ALKPHOS 100 01/12/2014 0418   AST 22 01/31/2014 0811   AST 29 01/12/2014 0418   ALT 18 01/31/2014 0811   ALT 31 01/12/2014 0418   BILITOT 0.50 01/31/2014 0811   BILITOT 0.6 01/12/2014 0418       No results found for: LABCA2  No components found for: [id number]  No results for input(s): INR in the last 168 hours.  Urinalysis    Component Value Date/Time   COLORURINE YELLOW 01/17/2014 0029   APPEARANCEUR CLEAR 01/17/2014 0029   LABSPEC 1.019 01/17/2014 0029   PHURINE 5.5 01/17/2014 0029   GLUCOSEU NEGATIVE 01/17/2014 0029   HGBUR NEGATIVE 01/17/2014 0029   BILIRUBINUR NEGATIVE 01/17/2014 0029   KETONESUR NEGATIVE 01/17/2014 0029   PROTEINUR NEGATIVE 01/17/2014 0029   UROBILINOGEN 1.0 01/17/2014 0029   NITRITE NEGATIVE 01/17/2014 0029   LEUKOCYTESUR TRACE* 01/17/2014 0029    STUDIES: No results  found.  ASSESSMENT: 80 y.o. Erik Ramirez with a  new diagnosis of diffuse large-cell, B-cell non-Hodgkin's lymphoma  (1) rituximab started 01/07/2014, with initial infusion reaction but able to receive entire dose that evening; to be repeated every 21 days   (2) dose-reduced CHOP/Rituxan started 01/10/2014--to be repeated every 21 days.  (a) patient was able to tolerate rituxan well starting with second infusion  (b) prednisone inadvertently left out of cycle 2, resumed cycle 3  (3) tumor lysis syndrome while hospitalized: very favorable initial reaction to treatment; following LDH and uric acid  (4) AKI while hospitalized: creatinine has normalized   PLAN: Erik Ramirez is tolerating his chemotherapy remarkably well. Clinically his functional status has improved. The plan is to proceed to cycle 3 today, with Neulasta tomorrow. We will then restage him before cycle 4. The goal is to complete between 6 and 8 cycles depending how long it will take to get him into remission, assuming we can get him into remission and he will then go on rituximab maintenance.  Going over his medicines with him it appears he did not receive prednisone with cycle 2. I will review that with our advanced practice providers, who are unfamiliar with this regimen. We went over the fact that he should be on prednisone 60 mg daily for 5 days beginning on day 2 of each cycle of CHOP. I went ahead and wrote those prescriptions. I also wrote him a prescription for Diflucan 100 mg to take daily as needed for thrush.  Today I wrote him for smaller (31-gauge) insulin needles at his request. I also refilled his clonazepam since the patient tells me he is changing primary care physicians and was not able to get it refilled there.  The patient has a good understanding of the overall plan. He agrees with it. He knows the goal of treatment in his case is cure. He will call with any problems that may develop before his next visit  here.  Chauncey Cruel, MD   02/21/2014 8:51 AM

## 2014-02-21 NOTE — Telephone Encounter (Signed)
per pof to sch pt appt-sent MW email to sch pt appt-ptr to get updated sch b4 leaving trmt

## 2014-02-21 NOTE — Progress Notes (Signed)
Results for FRANCESCO, PROVENCAL (MRN [id number]) as of 02/21/2014 09:34  Ref. Range 01/11/2014 03:47 01/12/2014 04:18 01/19/2014 14:57 01/31/2014 08:15 02/21/2014 08:12  LDH Latest Range: 125-245 U/L 313 (H) 269 (H) 206 212 206  Results for GER, RINGENBERG (MRN [id number]) as of 02/21/2014 09:34  Ref. Range 01/11/2014 03:47 01/12/2014 04:18 01/19/2014 14:57 01/31/2014 08:15 02/21/2014 08:12  Uric Acid, Serum Latest Range: 2.6-7.4 mg/dl 1.8 (L) 2.5 (L) 2.4 (L) 3.5 3.0  Results for TREVION, HOBEN (MRN [id number]) as of 02/21/2014 09:34  Ref. Range 01/14/2014 13:25 01/17/2014 00:21 01/19/2014 14:57 01/31/2014 08:11 02/21/2014 08:12  Creatinine Latest Range: 0.50-1.35 mg/dL 1.1 0.90 0.8 1.1 1.0

## 2014-02-21 NOTE — Patient Instructions (Signed)
[facility] Discharge Instructions for Patients Receiving Chemotherapy  Today you received the following chemotherapy agents vincristine/doxorubicin/cytoxan/rituxan.    To help prevent nausea and vomiting after your treatment, we encourage you to take your nausea medication as directed   If you develop nausea and vomiting that is not controlled by your nausea medication, call the clinic.   BELOW ARE SYMPTOMS THAT SHOULD BE REPORTED IMMEDIATELY:  *FEVER GREATER THAN 100.5 F  *CHILLS WITH OR WITHOUT FEVER  NAUSEA AND VOMITING THAT IS NOT CONTROLLED WITH YOUR NAUSEA MEDICATION  *UNUSUAL SHORTNESS OF BREATH  *UNUSUAL BRUISING OR BLEEDING  TENDERNESS IN MOUTH AND THROAT WITH OR WITHOUT PRESENCE OF ULCERS  *URINARY PROBLEMS  *BOWEL PROBLEMS  UNUSUAL RASH Items with * indicate a potential emergency and should be followed up as soon as possible.  Feel free to call the clinic you have any questions or concerns. The clinic phone number is [phone].

## 2014-02-21 NOTE — Telephone Encounter (Signed)
Per staff message and POF I have scheduled appts. Advised scheduler of appts. JMW  

## 2014-02-22 ENCOUNTER — Other Ambulatory Visit: Payer: Medicare Other

## 2014-02-22 ENCOUNTER — Ambulatory Visit (HOSPITAL_BASED_OUTPATIENT_CLINIC_OR_DEPARTMENT_OTHER): Payer: Medicare Other

## 2014-02-22 ENCOUNTER — Ambulatory Visit: Payer: Medicare Other

## 2014-02-22 DIAGNOSIS — C8330 Diffuse large B-cell lymphoma, unspecified site: Secondary | ICD-10-CM

## 2014-02-22 MED ORDER — PEGFILGRASTIM INJECTION 6 MG/0.6ML ~~LOC~~
6.0000 mg | PREFILLED_SYRINGE | Freq: Once | SUBCUTANEOUS | Status: AC
  Administered 2014-02-22: 6 mg via SUBCUTANEOUS
  Filled 2014-02-22: qty 0.6

## 2014-02-22 NOTE — Patient Instructions (Signed)
Pegfilgrastim injection What is this medicine? PEGFILGRASTIM (peg fil GRA stim) is a long-acting granulocyte colony-stimulating factor that stimulates the growth of neutrophils, a type of white blood cell important in the body's fight against infection. It is used to reduce the incidence of fever and infection in patients with certain types of cancer who are receiving chemotherapy that affects the bone marrow. This medicine may be used for other purposes; ask your health care provider or pharmacist if you have questions. COMMON BRAND NAME(S): Neulasta What should I tell my health care provider before I take this medicine? They need to know if you have any of these conditions: -latex allergy -ongoing radiation therapy -sickle cell disease -skin reactions to acrylic adhesives (On-Body Injector only) -an unusual or allergic reaction to pegfilgrastim, filgrastim, other medicines, foods, dyes, or preservatives -pregnant or trying to get pregnant -breast-feeding How should I use this medicine? This medicine is for injection under the skin. If you get this medicine at home, you will be taught how to prepare and give the pre-filled syringe or how to use the On-body Injector. Refer to the patient Instructions for Use for detailed instructions. Use exactly as directed. Take your medicine at regular intervals. Do not take your medicine more often than directed. It is important that you put your used needles and syringes in a special sharps container. Do not put them in a trash can. If you do not have a sharps container, call your pharmacist or healthcare provider to get one. Talk to your pediatrician regarding the use of this medicine in children. Special care may be needed. Overdosage: If you think you have taken too much of this medicine contact a poison control center or emergency room at once. NOTE: This medicine is only for you. Do not share this medicine with others. What if I miss a dose? It is  important not to miss your dose. Call your doctor or health care professional if you miss your dose. If you miss a dose due to an On-body Injector failure or leakage, a new dose should be administered as soon as possible using a single prefilled syringe for manual use. What may interact with this medicine? Interactions have not been studied. Give your health care provider a list of all the medicines, herbs, non-prescription drugs, or dietary supplements you use. Also tell them if you smoke, drink alcohol, or use illegal drugs. Some items may interact with your medicine. This list may not describe all possible interactions. Give your health care provider a list of all the medicines, herbs, non-prescription drugs, or dietary supplements you use. Also tell them if you smoke, drink alcohol, or use illegal drugs. Some items may interact with your medicine. What should I watch for while using this medicine? You may need blood work done while you are taking this medicine. If you are going to need a MRI, CT scan, or other procedure, tell your doctor that you are using this medicine (On-Body Injector only). What side effects may I notice from receiving this medicine? Side effects that you should report to your doctor or health care professional as soon as possible: -allergic reactions like skin rash, itching or hives, swelling of the face, lips, or tongue -dizziness -fever -pain, redness, or irritation at site where injected -pinpoint red spots on the skin -shortness of breath or breathing problems -stomach or side pain, or pain at the shoulder -swelling -tiredness -trouble passing urine Side effects that usually do not require medical attention (report to your doctor   or health care professional if they continue or are bothersome): -bone pain -muscle pain This list may not describe all possible side effects. Call your doctor for medical advice about side effects. You may report side effects to FDA at  1-800-FDA-1088. Where should I keep my medicine? Keep out of the reach of children. Store pre-filled syringes in a refrigerator between 2 and 8 degrees C (36 and 46 degrees F). Do not freeze. Keep in carton to protect from light. Throw away this medicine if it is left out of the refrigerator for more than 48 hours. Throw away any unused medicine after the expiration date. NOTE: This sheet is a summary. It may not cover all possible information. If you have questions about this medicine, talk to your doctor, pharmacist, or health care provider.  2015, [employer]. (2013-07-01 16:14:05)  

## 2014-03-01 ENCOUNTER — Ambulatory Visit (HOSPITAL_BASED_OUTPATIENT_CLINIC_OR_DEPARTMENT_OTHER): Payer: Medicare Other | Admitting: Nurse Practitioner

## 2014-03-01 ENCOUNTER — Encounter: Payer: Self-pay | Admitting: Nurse Practitioner

## 2014-03-01 ENCOUNTER — Other Ambulatory Visit (HOSPITAL_BASED_OUTPATIENT_CLINIC_OR_DEPARTMENT_OTHER): Payer: Medicare Other

## 2014-03-01 VITALS — BP 134/58 | HR 83 | Temp 98.1°F | Resp 18 | Ht 67.0 in | Wt 165.3 lb

## 2014-03-01 DIAGNOSIS — C8590 Non-Hodgkin lymphoma, unspecified, unspecified site: Secondary | ICD-10-CM

## 2014-03-01 DIAGNOSIS — K5900 Constipation, unspecified: Secondary | ICD-10-CM | POA: Insufficient documentation

## 2014-03-01 DIAGNOSIS — R21 Rash and other nonspecific skin eruption: Secondary | ICD-10-CM

## 2014-03-01 DIAGNOSIS — C8330 Diffuse large B-cell lymphoma, unspecified site: Secondary | ICD-10-CM

## 2014-03-01 DIAGNOSIS — B370 Candidal stomatitis: Secondary | ICD-10-CM | POA: Insufficient documentation

## 2014-03-01 LAB — CBC WITH DIFFERENTIAL/PLATELET
BASO%: 1.1 % (ref 0.0–2.0)
BASOS ABS: 0 10*3/uL (ref 0.0–0.1)
EOS%: 2.2 % (ref 0.0–7.0)
Eosinophils Absolute: 0.1 10*3/uL (ref 0.0–0.5)
HEMATOCRIT: 29.9 % — AB (ref 38.4–49.9)
HEMOGLOBIN: 9.8 g/dL — AB (ref 13.0–17.1)
LYMPH%: 30.6 % (ref 14.0–49.0)
MCH: 27.8 pg (ref 27.2–33.4)
MCHC: 32.8 g/dL (ref 32.0–36.0)
MCV: 84.9 fL (ref 79.3–98.0)
MONO#: 0.4 10*3/uL (ref 0.1–0.9)
MONO%: 12.6 % (ref 0.0–14.0)
NEUT#: 1.5 10*3/uL (ref 1.5–6.5)
NEUT%: 53.5 % (ref 39.0–75.0)
PLATELETS: 124 10*3/uL — AB (ref 140–400)
RBC: 3.52 10*6/uL — ABNORMAL LOW (ref 4.20–5.82)
RDW: 16.8 % — ABNORMAL HIGH (ref 11.0–14.6)
WBC: 2.8 10*3/uL — ABNORMAL LOW (ref 4.0–10.3)
lymph#: 0.9 10*3/uL (ref 0.9–3.3)

## 2014-03-01 MED ORDER — NYSTATIN 100000 UNIT/ML MT SUSP: OROMUCOSAL | Status: DC

## 2014-03-01 NOTE — Progress Notes (Signed)
Erik Ramirez  [phone] [phone]     ID: Erik Ramirez DOB: 05/19/1925  Erik#: [id number]  BMW#:[id number]  Patient Care Team: Valaria Good. [name], MD as PCP - General (Internal Medicine) OTHER MD:  CHIEF COMPLAINT: large cell diffuse B-cell non-Hodgkin's lymphoma  CURRENT TREATMENT: dose reduced CHOP/rituximab   HISTORY OF PRESENT ILLNESS: From the original intake note:  "Erik Ramirez developed abdominal pain and distension sometime mid August 2015. He thought this was diverticular disease. Eventually he brought these concerns to Dr [name] and per patient's report and chart review a CT of the abd/pelvis was obtained which showed significant adenopathy. Biopsy 12/13/2013 at [facility] reportedly showed a large cell, B-cell lymphoma. The patient has developed "B" symptoms including unexplained fatigue, fevers and drenching sweats. He presented to [facility] 01/05/2014 with worsening shortness of breath. Workup here included a chest CT 01/05/2014 which showed diffuse upper abdominal and mediastinal adenopathy, with bilateral pleural effusions. V/Q scan was low probability for PE and bilateral LE dopples were negative for DVT. Echo 01/06/2014 showed an EF or 55-60%. He developed A-fib 01/06/2014 and has been started on cardizem. Other problems include acute renal injury, DM, HTN, dyslipidemia with atherosclerosis noted on CT scans and OSA requiring norcturnal C-pap"  The patient's subsequent history is as detailed below  INTERVAL HISTORY: Erik Ramirez returns to clinic for follow up of his newly diagnosed b-cell lymphoma, accompanied by his son, [name]. Today is day 8, cycle 3 of R-CHOP therapy. He receives CHOP on day 1 and Rituximab on day 2, with neulasta on day 3 for granulocyte support. He receives this on a 21 day cycle. He tolerated this last round of chemo without incident or reaction.   REVIEW OF SYSTEMS: Erik Ramirez is only doing "ok" today. His fatigue  continues to be a problem, and it frustrates him because he was previously an active man for his age. He continues to walk around the house for exercises, and continues to perform the strengthening movements taught to him by the physical therapist. He continues to stay well hydrated and is diligent with his protein supplements at home, though his appetite is decreased. He is moving his bowels well with miralax, and is urinating well. He has lower pelvic discomfort, but denies true pain.  He denies fevers or chills. He uses a nystatin suspension regularly to ward off thrush. None is present today. The rash to his perineum is not as irritating as it was 2 weeks ago, he continues to apply nystatin cream to this area. He states that his ankles had been swollen on previous visits, but there is no evidence of this today. A detailed review of systems is otherwise noncontributory.  PAST MEDICAL HISTORY: Past Medical History  Diagnosis Date  . Hypertension   . Hyperlipemia   . Anxiety   . Lymphadenopathy, abdominal   . Urethral stricture   . Anemia   . BPPV (benign paroxysmal positional vertigo)   . Lymphoma   . Diabetes mellitus without complication   . Obstructive sleep apnea     PAST SURGICAL HISTORY: Past Surgical History  Procedure Laterality Date  . Back surgery    . Prostate surgery    . Tonsillectomy      FAMILY HISTORY No family history on file. Patient's father had a "muscle disease" and died from unclear causes age 22. Patient's mother died from heartproblems age 67. The patient had 1 brother, 5 sisters. 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 He and his wife [name] have ben married 73 years. Son [name] lives in [place] and works in Press photographer. Son [name] lives in [place] and is retired (disabled). Son [name] runs a maintenance shop in [place], where he lives. Son [name] works in  telephones in [place]. [name]'s wife, [name], is an Therapist, sports and worked for [employer] many years. She now works for [employer].The patient has 8 gch and 6 ggch. He is a Psychologist, forensic    ADVANCED DIRECTIVES: place. Erik [place] tells me "if they can't do anything for me" he would not want resuscitation or life support. His wife [name] is his health care POA, with son [name] listed second.   HEALTH MAINTENANCE: History  Substance Use Topics  . Smoking status: Never Smoker   . Smokeless tobacco: Not on file  . Alcohol Use: No     Colonoscopy:  PAP:  Bone density:  Lipid panel:  Allergies  Allergen Reactions  . Rituximab Other (See Comments)    Developed wheezing & rigors when infusion rate increased to 100 mg/hr.  Tolerates rate of 50 mg/hr.  . Caffeine Other (See Comments)    Makes patient feel like he's going to die  . Morphine And Related Other (See Comments)    excitability  . Penicillins Rash    Current Outpatient Prescriptions  Medication Sig Dispense Refill  . allopurinol (ZYLOPRIM) 300 MG tablet Take 1 tablet (300 mg total) by mouth daily. 90 tablet 1  . aspirin 325 MG tablet Take 1 tablet (325 mg total) by mouth daily. 30 tablet 0  . ciprofloxacin (CIPRO) 500 MG tablet Take 1 tablet (500 mg total) by mouth 2 (two) times daily. 10 tablet 0  . clorazepate (TRANXENE) 7.5 MG tablet Take 1 tablet (7.5 mg total) by mouth 2 (two) times daily as needed for anxiety or sleep. 90 tablet 0  . clotrimazole-betamethasone (LOTRISONE) cream Apply 1 application topically 2 (two) times daily.    Marland Kitchen diltiazem (CARDIZEM CD) 120 MG 24 hr capsule Take 1 capsule (120 mg total) by mouth daily. 90 capsule 1  . feeding supplement, ENSURE COMPLETE, (ENSURE COMPLETE) LIQD Take 237 mLs by mouth daily. 20 Bottle 1  . fluconazole (DIFLUCAN) 100 MG tablet Take 1 tablet (100 mg total) by mouth daily. 30 tablet 5  . glucose blood test strip ONE TOUCH ULTRA .CHECK BLOOD SUGAR AC AND HS DAILY 480 each 3  . insulin aspart  (NOVOLOG) 100 UNIT/ML injection Inject 0-20 Units into the skin 3 (three) times daily with meals. 10 mL 11  . lidocaine-prilocaine (EMLA) cream Apply 1 application topically as needed. APPLY TO PORT SITE 1 HOUR PRIOR TO THERAPY. DO NOT RUB IN. COVER WITH SARAN WRAP. 30 g 2  . metoprolol (LOPRESSOR) 50 MG tablet Take 25 mg by mouth 2 (two) times daily.      . miconazole (MICATIN) 2 % cream Apply 1 application topically 2 (two) times daily. Apply to groin area twice daily until clear 45 g 0  . naproxen (NAPROSYN) 375 MG tablet Take 1 tablet (375 mg total) by mouth 3 (three) times daily with meals. 20 tablet 0  . nystatin (MYCOSTATIN) 100000 UNIT/ML suspension SWISH AND SWALLOW 5 MLS BY MOUTH FOUR TIMES DAILY 240 mL 3  . ondansetron (ZOFRAN) 8 MG tablet Take 8 mg by mouth 2 (two) times daily as needed for nausea or vomiting.    . predniSONE (DELTASONE) 20 MG  tablet Take 3 tablets (60 mg total) by mouth daily with breakfast. 15 tablet 5  . simvastatin (ZOCOR) 20 MG tablet Take 20 mg by mouth daily.    . ondansetron (ZOFRAN) 8 MG tablet Take 1 tablet (8 mg total) by mouth every 12 (twelve) hours. 20 tablet 0  . traMADol (ULTRAM) 50 MG tablet Take 50 mg by mouth every 6 (six) hours as needed for moderate pain.     No current facility-administered medications for this visit.    OBJECTIVE: elderly white male who appears stated age 60 Vitals:   03/01/14 0840  BP: 134/58  Pulse: 83  Temp: 98.1 F (36.7 C)  Resp: 18     Body mass index is 25.88 kg/(m^2).    ECOG FS:1 - Symptomatic but completely ambulatory  Skin: warm, dry  HEENT: sclerae anicteric, conjunctivae pink, oropharynx clear. No thrush or mucositis.  Lymph Nodes: No cervical or supraclavicular lymphadenopathy  Lungs: clear to auscultation bilaterally, no rales, wheezes, or rhonci  Heart: regular rate and rhythm  Abdomen: round, soft, non tender, positive bowel sounds  Musculoskeletal: No focal spinal tenderness, no peripheral edema    Neuro: non focal, well oriented, positive affect   LAB RESULTS:  CMP     Component Value Date/Time   NA 138 02/21/2014 0812   NA 137 01/17/2014 0021   K 4.0 02/21/2014 0812   K 3.9 01/17/2014 0021   CL 98 01/17/2014 0021   CO2 27 02/21/2014 0812   CO2 24 01/12/2014 0418   GLUCOSE 113 02/21/2014 0812   GLUCOSE 116* 01/17/2014 0021   BUN 27.0* 02/21/2014 0812   BUN 22 01/17/2014 0021   CREATININE 1.0 02/21/2014 0812   CREATININE 0.90 01/17/2014 0021   CALCIUM 9.2 02/21/2014 0812   CALCIUM 7.9* 01/12/2014 0418   PROT 6.5 02/21/2014 0812   PROT 5.3* 01/12/2014 0418   ALBUMIN 3.0* 02/21/2014 0812   ALBUMIN 2.1* 01/12/2014 0418   AST 22 02/21/2014 0812   AST 29 01/12/2014 0418   ALT 17 02/21/2014 0812   ALT 31 01/12/2014 0418   ALKPHOS 73 02/21/2014 0812   ALKPHOS 100 01/12/2014 0418   BILITOT 0.52 02/21/2014 0812   BILITOT 0.6 01/12/2014 0418   GFRNONAA 51* 01/12/2014 0418   GFRAA 59* 01/12/2014 0418    INo results found for: SPEP, UPEP  Lab Results  Component Value Date   WBC 2.8* 03/01/2014   NEUTROABS 1.5 03/01/2014   HGB 9.8* 03/01/2014   HCT 29.9* 03/01/2014   MCV 84.9 03/01/2014   PLT 124* 03/01/2014      Chemistry      Component Value Date/Time   NA 138 02/21/2014 0812   NA 137 01/17/2014 0021   K 4.0 02/21/2014 0812   K 3.9 01/17/2014 0021   CL 98 01/17/2014 0021   CO2 27 02/21/2014 0812   CO2 24 01/12/2014 0418   BUN 27.0* 02/21/2014 0812   BUN 22 01/17/2014 0021   CREATININE 1.0 02/21/2014 0812   CREATININE 0.90 01/17/2014 0021      Component Value Date/Time   CALCIUM 9.2 02/21/2014 0812   CALCIUM 7.9* 01/12/2014 0418   ALKPHOS 73 02/21/2014 0812   ALKPHOS 100 01/12/2014 0418   AST 22 02/21/2014 0812   AST 29 01/12/2014 0418   ALT 17 02/21/2014 0812   ALT 31 01/12/2014 0418   BILITOT 0.52 02/21/2014 0812   BILITOT 0.6 01/12/2014 0418       No results found for: LABCA2  No components  found for: [id number]  No results for  input(s): INR in the last 168 hours.  Urinalysis    Component Value Date/Time   COLORURINE YELLOW 01/17/2014 0029   APPEARANCEUR CLEAR 01/17/2014 0029   LABSPEC 1.019 01/17/2014 0029   PHURINE 5.5 01/17/2014 0029   GLUCOSEU NEGATIVE 01/17/2014 0029   HGBUR NEGATIVE 01/17/2014 0029   BILIRUBINUR NEGATIVE 01/17/2014 0029   KETONESUR NEGATIVE 01/17/2014 0029   PROTEINUR NEGATIVE 01/17/2014 0029   UROBILINOGEN 1.0 01/17/2014 0029   NITRITE NEGATIVE 01/17/2014 0029   LEUKOCYTESUR TRACE* 01/17/2014 0029    STUDIES: No results found.  ASSESSMENT: 80 y.o. [name] with a new diagnosis of diffuse large-cell, B-cell non-Hodgkin's lymphoma  (1) rituximab started 01/07/2014, with initial infusion reaction but able to receive entire dose that evening; to be repeated every 21 days   (2) dose-reduced CHOP/Rituxan started 01/10/2014--to be repeated every 21 days.  (a) patient was able to tolerate rituxan well starting with second infusion  (b) prednisone inadvertently left out of cycle 2, resumed cycle 3  (3) tumor lysis syndrome while hospitalized: very favorable initial reaction to treatment; following LDH and uric acid  (4) AKI while hospitalized: creatinine has normalized   PLAN: Evin is doing moderately well today. The CBC was reviewed in detail and was stable. His anemia is slightly worse than last week at 9.8, but is no lower than he typically is after treatment. Besides fatigue, he is asymptomatic. I have refilled his prescription for both the nystatin oral suspension and nystatin cream for his perineum. I have also called in a prescription for miralax, as it may be cheaper for him to obtain in bulk vs the standard size found OTC. He already has prescriptions for the 60mg  prednisone x 5 days starting on day 2 of every cycle.   I have placed orders for a PET scan to be performed, as he has successfully completed cycle 3. Robby will return in 2 weeks for a follow up visit with  Dr. Jana Hakim to discuss these results and begin cycle 4. He understands and agrees with this plan. He has been encouraged to call with any issues that might arise before his next visit here.    Marcelino Duster, NP   03/01/2014 9:19 AM

## 2014-03-09 ENCOUNTER — Ambulatory Visit (HOSPITAL_COMMUNITY)
Admission: RE | Admit: 2014-03-09 | Discharge: 2014-03-09 | Disposition: A | Discharge status: Home / Self Care | Payer: Medicare Other | Source: Ambulatory Visit | Attending: Nurse Practitioner | Admitting: Nurse Practitioner

## 2014-03-09 DIAGNOSIS — E119 Type 2 diabetes mellitus without complications: Secondary | ICD-10-CM | POA: Insufficient documentation

## 2014-03-09 DIAGNOSIS — C8330 Diffuse large B-cell lymphoma, unspecified site: Secondary | ICD-10-CM | POA: Diagnosis not present

## 2014-03-09 LAB — GLUCOSE, CAPILLARY: GLUCOSE-CAPILLARY: 127 mg/dL — AB (ref 70–99)

## 2014-03-09 MED ORDER — FLUDEOXYGLUCOSE F - 18 (FDG) INJECTION
8.1000 | Freq: Once | INTRAVENOUS | Status: AC | PRN
  Administered 2014-03-09: 8.1 via INTRAVENOUS

## 2014-03-14 ENCOUNTER — Telehealth: Payer: Self-pay | Admitting: Oncology

## 2014-03-14 ENCOUNTER — Ambulatory Visit (HOSPITAL_BASED_OUTPATIENT_CLINIC_OR_DEPARTMENT_OTHER): Payer: Medicare Other

## 2014-03-14 ENCOUNTER — Other Ambulatory Visit (HOSPITAL_BASED_OUTPATIENT_CLINIC_OR_DEPARTMENT_OTHER): Payer: Medicare Other

## 2014-03-14 ENCOUNTER — Ambulatory Visit (HOSPITAL_BASED_OUTPATIENT_CLINIC_OR_DEPARTMENT_OTHER): Payer: Medicare Other | Admitting: Oncology

## 2014-03-14 ENCOUNTER — Other Ambulatory Visit: Payer: Self-pay | Admitting: Oncology

## 2014-03-14 VITALS — BP 149/70 | HR 78 | Temp 97.8°F | Resp 18 | Ht 67.0 in | Wt 161.1 lb

## 2014-03-14 DIAGNOSIS — C8330 Diffuse large B-cell lymphoma, unspecified site: Secondary | ICD-10-CM

## 2014-03-14 DIAGNOSIS — N183 Chronic kidney disease, stage 3 unspecified: Secondary | ICD-10-CM

## 2014-03-14 DIAGNOSIS — Z5112 Encounter for antineoplastic immunotherapy: Secondary | ICD-10-CM

## 2014-03-14 DIAGNOSIS — D630 Anemia in neoplastic disease: Secondary | ICD-10-CM

## 2014-03-14 DIAGNOSIS — C8590 Non-Hodgkin lymphoma, unspecified, unspecified site: Secondary | ICD-10-CM

## 2014-03-14 DIAGNOSIS — E883 Tumor lysis syndrome: Secondary | ICD-10-CM

## 2014-03-14 LAB — CBC WITH DIFFERENTIAL/PLATELET
BASO%: 1.1 % (ref 0.0–2.0)
Basophils Absolute: 0.1 10*3/uL (ref 0.0–0.1)
EOS%: 0.8 % (ref 0.0–7.0)
Eosinophils Absolute: 0.1 10*3/uL (ref 0.0–0.5)
HCT: 31.6 % — ABNORMAL LOW (ref 38.4–49.9)
HGB: 10.2 g/dL — ABNORMAL LOW (ref 13.0–17.1)
LYMPH%: 16.4 % (ref 14.0–49.0)
MCH: 27.5 pg (ref 27.2–33.4)
MCHC: 32.4 g/dL (ref 32.0–36.0)
MCV: 84.8 fL (ref 79.3–98.0)
MONO#: 0.9 10*3/uL (ref 0.1–0.9)
MONO%: 10.2 % (ref 0.0–14.0)
NEUT#: 6.4 10*3/uL (ref 1.5–6.5)
NEUT%: 71.5 % (ref 39.0–75.0)
Platelets: 233 10*3/uL (ref 140–400)
RBC: 3.73 10*6/uL — AB (ref 4.20–5.82)
RDW: 18 % — ABNORMAL HIGH (ref 11.0–14.6)
WBC: 9 10*3/uL (ref 4.0–10.3)
lymph#: 1.5 10*3/uL (ref 0.9–3.3)

## 2014-03-14 LAB — COMPREHENSIVE METABOLIC PANEL (CC13)
ALBUMIN: 2.7 g/dL — AB (ref 3.5–5.0)
ALT: 15 U/L (ref 0–55)
ANION GAP: 9 meq/L (ref 3–11)
AST: 21 U/L (ref 5–34)
Alkaline Phosphatase: 80 U/L (ref 40–150)
BUN: 22.1 mg/dL (ref 7.0–26.0)
CALCIUM: 9.4 mg/dL (ref 8.4–10.4)
CHLORIDE: 103 meq/L (ref 98–109)
CO2: 29 mEq/L (ref 22–29)
Creatinine: 0.9 mg/dL (ref 0.7–1.3)
GLUCOSE: 124 mg/dL (ref 70–140)
Potassium: 4.7 mEq/L (ref 3.5–5.1)
SODIUM: 141 meq/L (ref 136–145)
Total Bilirubin: 0.32 mg/dL (ref 0.20–1.20)
Total Protein: 6.1 g/dL — ABNORMAL LOW (ref 6.4–8.3)

## 2014-03-14 LAB — URIC ACID (CC13): URIC ACID, SERUM: 3.2 mg/dL (ref 2.6–7.4)

## 2014-03-14 LAB — LACTATE DEHYDROGENASE (CC13): LDH: 214 U/L (ref 125–245)

## 2014-03-14 MED ORDER — SODIUM CHLORIDE 0.9 % IJ SOLN
10.0000 mL | INTRAMUSCULAR | Status: DC | PRN
  Administered 2014-03-14: 10 mL
  Filled 2014-03-14: qty 10

## 2014-03-14 MED ORDER — DEXAMETHASONE SODIUM PHOSPHATE 20 MG/5ML IJ SOLN
20.0000 mg | Freq: Once | INTRAMUSCULAR | Status: AC
  Administered 2014-03-14: 20 mg via INTRAVENOUS

## 2014-03-14 MED ORDER — SODIUM CHLORIDE 0.9 % IV SOLN
700.0000 mg | Freq: Once | INTRAVENOUS | Status: AC
  Administered 2014-03-14: 700 mg via INTRAVENOUS
  Filled 2014-03-14: qty 70

## 2014-03-14 MED ORDER — FAMOTIDINE IN NACL 20-0.9 MG/50ML-% IV SOLN
20.0000 mg | Freq: Two times a day (BID) | INTRAVENOUS | Status: DC
  Administered 2014-03-14: 20 mg via INTRAVENOUS

## 2014-03-14 MED ORDER — ONDANSETRON 16 MG/50ML IVPB (CHCC)
16.0000 mg | Freq: Once | INTRAVENOUS | Status: AC
  Administered 2014-03-14: 16 mg via INTRAVENOUS

## 2014-03-14 MED ORDER — DEXAMETHASONE SODIUM PHOSPHATE 20 MG/5ML IJ SOLN
INTRAMUSCULAR | Status: AC
  Filled 2014-03-14: qty 5

## 2014-03-14 MED ORDER — SODIUM CHLORIDE 0.9 % IV SOLN
Freq: Once | INTRAVENOUS | Status: AC
  Administered 2014-03-14: 11:00:00 via INTRAVENOUS

## 2014-03-14 MED ORDER — DIPHENHYDRAMINE HCL 25 MG PO CAPS
25.0000 mg | ORAL_CAPSULE | Freq: Once | ORAL | Status: AC
  Administered 2014-03-14: 25 mg via ORAL

## 2014-03-14 MED ORDER — ONDANSETRON 16 MG/50ML IVPB (CHCC)
INTRAVENOUS | Status: AC
  Filled 2014-03-14: qty 16

## 2014-03-14 MED ORDER — DOXORUBICIN HCL CHEMO IV INJECTION 2 MG/ML
25.0000 mg/m2 | Freq: Once | INTRAVENOUS | Status: AC
  Administered 2014-03-14: 50 mg via INTRAVENOUS
  Filled 2014-03-14: qty 25

## 2014-03-14 MED ORDER — VINCRISTINE SULFATE CHEMO INJECTION 1 MG/ML
1.0000 mg | Freq: Once | INTRAVENOUS | Status: AC
  Administered 2014-03-14: 1 mg via INTRAVENOUS
  Filled 2014-03-14: qty 1

## 2014-03-14 MED ORDER — HEPARIN SOD (PORK) LOCK FLUSH 100 UNIT/ML IV SOLN
500.0000 [IU] | Freq: Once | INTRAVENOUS | Status: AC | PRN
  Administered 2014-03-14: 500 [IU]
  Filled 2014-03-14: qty 5

## 2014-03-14 MED ORDER — FAMOTIDINE IN NACL 20-0.9 MG/50ML-% IV SOLN
INTRAVENOUS | Status: AC
  Filled 2014-03-14: qty 50

## 2014-03-14 MED ORDER — ACETAMINOPHEN 325 MG PO TABS
650.0000 mg | ORAL_TABLET | Freq: Once | ORAL | Status: AC
  Administered 2014-03-14: 650 mg via ORAL

## 2014-03-14 MED ORDER — DIPHENHYDRAMINE HCL 25 MG PO CAPS
ORAL_CAPSULE | ORAL | Status: AC
  Filled 2014-03-14: qty 1

## 2014-03-14 MED ORDER — ACETAMINOPHEN 325 MG PO TABS
ORAL_TABLET | ORAL | Status: AC
  Filled 2014-03-14: qty 2

## 2014-03-14 MED ORDER — SODIUM CHLORIDE 0.9 % IV SOLN
400.0000 mg/m2 | Freq: Once | INTRAVENOUS | Status: AC
  Administered 2014-03-14: 800 mg via INTRAVENOUS
  Filled 2014-03-14: qty 40

## 2014-03-14 NOTE — Telephone Encounter (Signed)
, °

## 2014-03-14 NOTE — Progress Notes (Signed)
[place]  [phone] [phone]     ID: Erik Ramirez DOB: [birth year]  Erik#: [id number]  GYI#:[id number]  Patient Care Team: Valaria Good. [name], MD as PCP - General (Internal Medicine) OTHER MD:  CHIEF COMPLAINT: large cell diffuse B-cell non-Hodgkin's lymphoma  CURRENT TREATMENT: dose reduced CHOP/rituximab   HISTORY OF PRESENT ILLNESS: From the original intake note:  "Erik Ramirez developed abdominal pain and distension sometime mid August 2015. He thought this was diverticular disease. Eventually he brought these concerns to Dr [name] and per patient's report and chart review a CT of the abd/pelvis was obtained which showed significant adenopathy. Biopsy 12/13/2013 at [facility] reportedly showed a large cell, B-cell lymphoma. The patient has developed "B" symptoms including unexplained fatigue, fevers and drenching sweats. He presented to [facility] 01/05/2014 with worsening shortness of breath. Workup here included a chest CT 01/05/2014 which showed diffuse upper abdominal and mediastinal adenopathy, with bilateral pleural effusions. V/Q scan was low probability for PE and bilateral LE dopples were negative for DVT. Echo 01/06/2014 showed an EF or 55-60%. He developed A-fib 01/06/2014 and has been started on cardizem. Other problems include acute renal injury, DM, HTN, dyslipidemia with atherosclerosis noted on CT scans and OSA requiring norcturnal C-pap"  The patient's subsequent history is as detailed below  INTERVAL HISTORY: Erik. Ramirez returns today accompanied by his son Erik Ramirez for follow-up of Erik. Ramirez' non-Hodgkin's lymphoma. Today is day 1 cycle 4 of a minimum of 8 planned cycles of treatment  REVIEW OF SYSTEMS: He had a good time over Thanksgiving's, doing quite a bit of eating, and keeping his family at arms length. He wears a mask most of the time to accomplish that. He has had no temperature above 100.3 or so. He does have thrush  at times and he knows that he uses mouth rinse and Diflucan for that. He has some sinus drainage, but no shortness of breath, no significant cough, and no change in bowel or bladder habits. Sometimes he feels moderately fatigued. Occasionally, very rarely, he takes a nap. He has a rash on his bottom which is described in the physical exam below. He also has a minor rash on his scalp. He feels anxious but not depressed. A detailed review of systems today was otherwise stable  PAST MEDICAL HISTORY: Past Medical History  Diagnosis Date  . Hypertension   . Hyperlipemia   . Anxiety   . Lymphadenopathy, abdominal   . Urethral stricture   . Anemia   . BPPV (benign paroxysmal positional vertigo)   . Lymphoma   . Diabetes mellitus without complication   . Obstructive sleep apnea     PAST SURGICAL HISTORY: Past Surgical History  Procedure Laterality Date  . Back surgery    . Prostate surgery    . Tonsillectomy      FAMILY HISTORY No family history on file. Patient's father had a "muscle disease" and died from unclear causes age 67. Patient's mother died from heartproblems age 51. The patient had 1 brother, 5 sisters. 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 He and his wife Erik Ramirez have ben married 72 years. Son Erik Ramirez lives in [place] and works in Press photographer. Son Erik Ramirez lives in [place] and is retired (disabled). Son Erik Ramirez runs a maintenance shop in [place], where he lives. Son Erik Ramirez works in telephones in [place]. Erik Ramirez's wife, [name], is an Therapist, sports  and worked for [employer] many years. She now works for [employer].The patient has 8 gch and 6 ggch. He is a Psychologist, forensic    ADVANCED DIRECTIVES: place. Erik Corsi tells me "if they can't do anything for me" he would not want resuscitation or life support. His wife Erik Ramirez is his health care POA, with son Erik Ramirez listed second.   HEALTH MAINTENANCE: History    Substance Use Topics  . Smoking status: Never Smoker   . Smokeless tobacco: Not on file  . Alcohol Use: No     Colonoscopy:  PAP:  Bone density:  Lipid panel:  Allergies  Allergen Reactions  . Rituximab Other (See Comments)    Developed wheezing & rigors when infusion rate increased to 100 mg/hr.  Tolerates rate of 50 mg/hr.  . Caffeine Other (See Comments)    Makes patient feel like he's going to die  . Morphine And Related Other (See Comments)    excitability  . Penicillins Rash    Current Outpatient Prescriptions  Medication Sig Dispense Refill  . allopurinol (ZYLOPRIM) 300 MG tablet Take 1 tablet (300 mg total) by mouth daily. 90 tablet 1  . aspirin 325 MG tablet Take 1 tablet (325 mg total) by mouth daily. 30 tablet 0  . ciprofloxacin (CIPRO) 500 MG tablet Take 1 tablet (500 mg total) by mouth 2 (two) times daily. 10 tablet 0  . clorazepate (TRANXENE) 7.5 MG tablet Take 1 tablet (7.5 mg total) by mouth 2 (two) times daily as needed for anxiety or sleep. 90 tablet 0  . clotrimazole-betamethasone (LOTRISONE) cream Apply 1 application topically 2 (two) times daily.    Marland Kitchen diltiazem (CARDIZEM CD) 120 MG 24 hr capsule Take 1 capsule (120 mg total) by mouth daily. 90 capsule 1  . feeding supplement, ENSURE COMPLETE, (ENSURE COMPLETE) LIQD Take 237 mLs by mouth daily. 20 Bottle 1  . fluconazole (DIFLUCAN) 100 MG tablet Take 1 tablet (100 mg total) by mouth daily. 30 tablet 5  . glucose blood test strip ONE TOUCH ULTRA .CHECK BLOOD SUGAR AC AND HS DAILY 480 each 3  . insulin aspart (NOVOLOG) 100 UNIT/ML injection Inject 0-20 Units into the skin 3 (three) times daily with meals. 10 mL 11  . lidocaine-prilocaine (EMLA) cream Apply 1 application topically as needed. APPLY TO PORT SITE 1 HOUR PRIOR TO THERAPY. DO NOT RUB IN. COVER WITH SARAN WRAP. 30 g 2  . metoprolol (LOPRESSOR) 50 MG tablet Take 25 mg by mouth 2 (two) times daily.      . miconazole (MICATIN) 2 % cream Apply 1  application topically 2 (two) times daily. Apply to groin area twice daily until clear 45 g 0  . naproxen (NAPROSYN) 375 MG tablet Take 1 tablet (375 mg total) by mouth 3 (three) times daily with meals. 20 tablet 0  . nystatin (MYCOSTATIN) 100000 UNIT/ML suspension SWISH AND SWALLOW 5 MLS BY MOUTH FOUR TIMES DAILY 240 mL 3  . ondansetron (ZOFRAN) 8 MG tablet Take 1 tablet (8 mg total) by mouth every 12 (twelve) hours. 20 tablet 0  . ondansetron (ZOFRAN) 8 MG tablet Take 8 mg by mouth 2 (two) times daily as needed for nausea or vomiting.    . predniSONE (DELTASONE) 20 MG tablet Take 3 tablets (60 mg total) by mouth daily with breakfast. 15 tablet 5  . simvastatin (ZOCOR) 20 MG tablet Take 20 mg by mouth daily.    . traMADol (ULTRAM) 50 MG tablet Take 50 mg by mouth every  6 (six) hours as needed for moderate pain.     No current facility-administered medications for this visit.    OBJECTIVE: elderly white maN in no acute distress Filed Vitals:   03/14/14 0914  BP: 149/70  Pulse: 78  Temp: 97.8 F (36.6 C)  Resp: 18     Body mass index is 25.23 kg/(m^2).    ECOG FS:1 - Symptomatic but completely ambulatory  Ocular: Sclerae unicteric, pupils round and equal Ear-nose-throat: Oropharynx shows minimal thrush Lymphatic: I do not palpate any left supraclavicular adenopathy Lungs no rales or rhonchi Heart regular rate and rhythm today Abd soft, nontender, positive bowel sounds, no splenomegaly palpated MSK no focal spinal tenderness Neuro: non-focal, well-oriented, positive affect  LAB RESULTS:  CMP     Component Value Date/Time   NA 141 03/14/2014 0903   NA 137 01/17/2014 0021   K 4.7 03/14/2014 0903   K 3.9 01/17/2014 0021   CL 98 01/17/2014 0021   CO2 29 03/14/2014 0903   CO2 24 01/12/2014 0418   GLUCOSE 124 03/14/2014 0903   GLUCOSE 116* 01/17/2014 0021   BUN 22.1 03/14/2014 0903   BUN 22 01/17/2014 0021   CREATININE 0.9 03/14/2014 0903   CREATININE 0.90 01/17/2014 0021    CALCIUM 9.4 03/14/2014 0903   CALCIUM 7.9* 01/12/2014 0418   PROT 6.1* 03/14/2014 0903   PROT 5.3* 01/12/2014 0418   ALBUMIN 2.7* 03/14/2014 0903   ALBUMIN 2.1* 01/12/2014 0418   AST 21 03/14/2014 0903   AST 29 01/12/2014 0418   ALT 15 03/14/2014 0903   ALT 31 01/12/2014 0418   ALKPHOS 80 03/14/2014 0903   ALKPHOS 100 01/12/2014 0418   BILITOT 0.32 03/14/2014 0903   BILITOT 0.6 01/12/2014 0418   GFRNONAA 51* 01/12/2014 0418   GFRAA 59* 01/12/2014 0418    INo results found for: SPEP, UPEP  Lab Results  Component Value Date   WBC 9.0 03/14/2014   NEUTROABS 6.4 03/14/2014   HGB 10.2* 03/14/2014   HCT 31.6* 03/14/2014   MCV 84.8 03/14/2014   PLT 233 03/14/2014      Chemistry      Component Value Date/Time   NA 141 03/14/2014 0903   NA 137 01/17/2014 0021   K 4.7 03/14/2014 0903   K 3.9 01/17/2014 0021   CL 98 01/17/2014 0021   CO2 29 03/14/2014 0903   CO2 24 01/12/2014 0418   BUN 22.1 03/14/2014 0903   BUN 22 01/17/2014 0021   CREATININE 0.9 03/14/2014 0903   CREATININE 0.90 01/17/2014 0021      Component Value Date/Time   CALCIUM 9.4 03/14/2014 0903   CALCIUM 7.9* 01/12/2014 0418   ALKPHOS 80 03/14/2014 0903   ALKPHOS 100 01/12/2014 0418   AST 21 03/14/2014 0903   AST 29 01/12/2014 0418   ALT 15 03/14/2014 0903   ALT 31 01/12/2014 0418   BILITOT 0.32 03/14/2014 0903   BILITOT 0.6 01/12/2014 0418       No results found for: LABCA2  No components found for: [id number]  No results for input(s): INR in the last 168 hours.  Urinalysis    Component Value Date/Time   COLORURINE YELLOW 01/17/2014 0029   APPEARANCEUR CLEAR 01/17/2014 0029   LABSPEC 1.019 01/17/2014 0029   PHURINE 5.5 01/17/2014 0029   GLUCOSEU NEGATIVE 01/17/2014 0029   HGBUR NEGATIVE 01/17/2014 0029   BILIRUBINUR NEGATIVE 01/17/2014 [place] 01/17/2014 0029   PROTEINUR NEGATIVE 01/17/2014 0029   UROBILINOGEN 1.0 01/17/2014 0029  NITRITE NEGATIVE 01/17/2014 0029    LEUKOCYTESUR TRACE* 01/17/2014 0029    STUDIES: Nm Pet Image Restag (ps) Skull Base To Thigh  03/09/2014   CLINICAL DATA:  Subsequent treatment strategy for diffuse large B-cell lymphoma.  EXAM: NUCLEAR MEDICINE PET SKULL BASE TO THIGH  TECHNIQUE: 8.1 mCi F-18 FDG was injected intravenously. Full-ring PET imaging was performed from the skull base to thigh after the radiotracer. CT data was obtained and used for attenuation correction and anatomic localization.  FASTING BLOOD GLUCOSE:  Value: 127 mg/dl  COMPARISON:  CT chest dated 01/05/2014. CT abdomen pelvis dated 12/13/2013.  FINDINGS: NECK  Mild focal hypermetabolism above the level of the right vocal cord, max SUV 4.5 (PET image 46), indeterminate. No corresponding mass on CT.  11 mm short axis left supraclavicular node (series 4/image 47), previously 15 mm, max SUV 10.9.  CHEST  No suspicious pulmonary nodules.  Residual 12 mm subcarinal node (series 4/image 80), non FDG avid. Prior mediastinal lymphadenopathy is otherwise resolved.  No hypermetabolic mediastinal or hilar lymphadenopathy.  Coronary atherosclerosis.  Right chest port.  ABDOMEN/PELVIS  No abnormal hypermetabolic activity within the liver, pancreas, adrenal glands, or spleen.  Wall thickening/aneurysmal dilatation of the terminal ileum (series 4/ image 160), with associated hypermetabolism (max SUV 21.8), compatible with lymphomatous involvement.  Extensive hypermetabolic lymphadenopathy in the abdomen/retroperitoneum, improved, including:  --1.5 cm short axis portacaval node (series 4/ image 126), max SUV 12.6, previously 3.1 cm  --10 mm short axis retrocaval node (series 4/ image 133), max SUV 9.3, unchanged  --3.1 cm jejunal mesenteric node (series 4/image 440), max SUV 19.8, previously 3.2 cm  Vascular calcifications. Moderate fat containing right inguinal hernia. Bladder is mildly thick-walled but underdistended.  Status post prostatectomy  SKELETON  Mild wall thickening involving the  sternum and medial clavicles/sternoclavicular junctions, max SUV 4.2. This appearance may be degenerative, but lymphomatous involvement is not excluded.  Otherwise, heterogeneous marrow uptake without findings suspicious for osseous metastases.  IMPRESSION: Lymphomatous involvement of the terminal ileum.  Upper abdominal/ retroperitoneal nodal metastases, mildly improved.  Left supraclavicular node. Thoracic lymphadenopathy has otherwise resolved.  Mild hypermetabolism involving the sternum, possibly degenerative, lymphomatous involvement not entirely excluded.   Electronically Signed   By: Julian Hy M.D.   On: 03/09/2014 09:57    ASSESSMENT: 80 y.o. [name] with a new diagnosis of diffuse large-cell, B-cell non-Hodgkin's lymphoma  (1) rituximab started 01/07/2014, with initial infusion reaction but able to receive entire dose that evening; to be repeated every 21 days   (2) dose-reduced CHOP/Rituxan started 01/10/2014--to be repeated every 21 days.  (a) patient was able to tolerate rituxan well starting with second infusion  (b) prednisone inadvertently left out of cycle 2, resumed cycle 3  (3) tumor lysis syndrome while hospitalized: very favorable initial reaction to treatment; following LDH and uric acid  (4) AKI while hospitalized: creatinine has normalized since   PLAN: Erik Candella is having a good early response to his attenuated chemotherapy. I am not sure why we are seeing continuing high levels of SUV uptake at the terminal ileum as opposed to other areas, but if this persists at the next PET scan we may need to do a colonoscopy and biopsies of that area to make sure we don't have a separate problem. Similarly, some lymph nodes have not significantly changed. In short for what I would not want is for [place] to be dealing with a primary small bowel tumor in addition to his lymphoma.  However at  this point the response is very encouraging. I am making no changes in his overall  treatment. The plan is to proceed to cycle 4 today then cycles 5 and 6 after which we will repeat a PET scan with a view to likely continuing to a total of 8 cycles and then following with maintenance rituximab.  The patient has a good understanding of the overall plan. He agrees with it. He knows the goal of treatment in his case is cure. He will call with any problems that may develop before his next visit here.  Chauncey Cruel, MD   03/14/2014 9:56 AM

## 2014-03-14 NOTE — Patient Instructions (Signed)
[facility] Discharge Instructions for Patients Receiving Chemotherapy  Today you received the following chemotherapy agents: Adriamycin, Cytoxan, Vincristine and Rituxan. To help prevent nausea and vomiting after your treatment, we encourage you to take your nausea medication, Zofran. Take one every 8 hours as needed.   If you develop nausea and vomiting that is not controlled by your nausea medication, call the clinic.   BELOW ARE SYMPTOMS THAT SHOULD BE REPORTED IMMEDIATELY:  *FEVER GREATER THAN 100.5 F  *CHILLS WITH OR WITHOUT FEVER  NAUSEA AND VOMITING THAT IS NOT CONTROLLED WITH YOUR NAUSEA MEDICATION  *UNUSUAL SHORTNESS OF BREATH  *UNUSUAL BRUISING OR BLEEDING  TENDERNESS IN MOUTH AND THROAT WITH OR WITHOUT PRESENCE OF ULCERS  *URINARY PROBLEMS  *BOWEL PROBLEMS  UNUSUAL RASH Items with * indicate a potential emergency and should be followed up as soon as possible.  Feel free to call the clinic should you have any questions or concerns. The clinic phone number is [phone].

## 2014-03-15 ENCOUNTER — Ambulatory Visit (HOSPITAL_BASED_OUTPATIENT_CLINIC_OR_DEPARTMENT_OTHER): Payer: Medicare Other

## 2014-03-15 DIAGNOSIS — C8330 Diffuse large B-cell lymphoma, unspecified site: Secondary | ICD-10-CM

## 2014-03-15 MED ORDER — PEGFILGRASTIM INJECTION 6 MG/0.6ML ~~LOC~~
6.0000 mg | PREFILLED_SYRINGE | Freq: Once | SUBCUTANEOUS | Status: AC
  Administered 2014-03-15: 6 mg via SUBCUTANEOUS
  Filled 2014-03-15: qty 0.6

## 2014-03-15 NOTE — Patient Instructions (Signed)
Pegfilgrastim injection What is this medicine? PEGFILGRASTIM (peg fil GRA stim) is a long-acting granulocyte colony-stimulating factor that stimulates the growth of neutrophils, a type of white blood cell important in the body's fight against infection. It is used to reduce the incidence of fever and infection in patients with certain types of cancer who are receiving chemotherapy that affects the bone marrow. This medicine may be used for other purposes; ask your health care provider or pharmacist if you have questions. COMMON BRAND NAME(S): Neulasta What should I tell my health care provider before I take this medicine? They need to know if you have any of these conditions: -latex allergy -ongoing radiation therapy -sickle cell disease -skin reactions to acrylic adhesives (On-Body Injector only) -an unusual or allergic reaction to pegfilgrastim, filgrastim, other medicines, foods, dyes, or preservatives -pregnant or trying to get pregnant -breast-feeding How should I use this medicine? This medicine is for injection under the skin. If you get this medicine at home, you will be taught how to prepare and give the pre-filled syringe or how to use the On-body Injector. Refer to the patient Instructions for Use for detailed instructions. Use exactly as directed. Take your medicine at regular intervals. Do not take your medicine more often than directed. It is important that you put your used needles and syringes in a special sharps container. Do not put them in a trash can. If you do not have a sharps container, call your pharmacist or healthcare provider to get one. Talk to your pediatrician regarding the use of this medicine in children. Special care may be needed. Overdosage: If you think you have taken too much of this medicine contact a poison control center or emergency room at once. NOTE: This medicine is only for you. Do not share this medicine with others. What if I miss a dose? It is  important not to miss your dose. Call your doctor or health care professional if you miss your dose. If you miss a dose due to an On-body Injector failure or leakage, a new dose should be administered as soon as possible using a single prefilled syringe for manual use. What may interact with this medicine? Interactions have not been studied. Give your health care provider a list of all the medicines, herbs, non-prescription drugs, or dietary supplements you use. Also tell them if you smoke, drink alcohol, or use illegal drugs. Some items may interact with your medicine. This list may not describe all possible interactions. Give your health care provider a list of all the medicines, herbs, non-prescription drugs, or dietary supplements you use. Also tell them if you smoke, drink alcohol, or use illegal drugs. Some items may interact with your medicine. What should I watch for while using this medicine? You may need blood work done while you are taking this medicine. If you are going to need a MRI, CT scan, or other procedure, tell your doctor that you are using this medicine (On-Body Injector only). What side effects may I notice from receiving this medicine? Side effects that you should report to your doctor or health care professional as soon as possible: -allergic reactions like skin rash, itching or hives, swelling of the face, lips, or tongue -dizziness -fever -pain, redness, or irritation at site where injected -pinpoint red spots on the skin -shortness of breath or breathing problems -stomach or side pain, or pain at the shoulder -swelling -tiredness -trouble passing urine Side effects that usually do not require medical attention (report to your doctor   or health care professional if they continue or are bothersome): -bone pain -muscle pain This list may not describe all possible side effects. Call your doctor for medical advice about side effects. You may report side effects to FDA at  1-800-FDA-1088. Where should I keep my medicine? Keep out of the reach of children. Store pre-filled syringes in a refrigerator between 2 and 8 degrees C (36 and 46 degrees F). Do not freeze. Keep in carton to protect from light. Throw away this medicine if it is left out of the refrigerator for more than 48 hours. Throw away any unused medicine after the expiration date. NOTE: This sheet is a summary. It may not cover all possible information. If you have questions about this medicine, talk to your doctor, pharmacist, or health care provider.  2015, [employer]. (2013-07-01 16:14:05)  

## 2014-03-16 NOTE — Addendum Note (Signed)
Addended by: Laureen Abrahams on: 03/16/2014 06:52 PM   Modules accepted: Medications

## 2014-03-18 ENCOUNTER — Other Ambulatory Visit: Payer: Self-pay | Admitting: *Deleted

## 2014-03-21 ENCOUNTER — Encounter: Payer: Self-pay | Admitting: Nurse Practitioner

## 2014-03-21 ENCOUNTER — Other Ambulatory Visit: Payer: Self-pay | Admitting: Nurse Practitioner

## 2014-03-21 DIAGNOSIS — E7800 Pure hypercholesterolemia, unspecified: Secondary | ICD-10-CM

## 2014-03-21 MED ORDER — SIMVASTATIN 20 MG PO TABS: 20.0000 mg | ORAL_TABLET | Freq: Every day | ORAL | Status: DC

## 2014-03-23 ENCOUNTER — Telehealth: Payer: Self-pay | Admitting: Oncology

## 2014-03-23 ENCOUNTER — Other Ambulatory Visit: Payer: Self-pay | Admitting: Oncology

## 2014-03-23 NOTE — Telephone Encounter (Signed)
per pof to sch pt appt-gave pt copy of sch °

## 2014-04-04 ENCOUNTER — Ambulatory Visit (HOSPITAL_BASED_OUTPATIENT_CLINIC_OR_DEPARTMENT_OTHER): Payer: Medicare Other | Admitting: Oncology

## 2014-04-04 ENCOUNTER — Ambulatory Visit: Payer: Medicare Other | Admitting: Oncology

## 2014-04-04 ENCOUNTER — Ambulatory Visit (HOSPITAL_BASED_OUTPATIENT_CLINIC_OR_DEPARTMENT_OTHER): Payer: Medicare Other

## 2014-04-04 ENCOUNTER — Encounter: Payer: Self-pay | Admitting: Oncology

## 2014-04-04 ENCOUNTER — Encounter (HOSPITAL_BASED_OUTPATIENT_CLINIC_OR_DEPARTMENT_OTHER): Payer: Medicare Other | Admitting: Oncology

## 2014-04-04 ENCOUNTER — Other Ambulatory Visit: Payer: Medicare Other

## 2014-04-04 VITALS — BP 148/65 | HR 85 | Temp 97.7°F | Resp 19 | Ht 67.0 in | Wt 159.4 lb

## 2014-04-04 DIAGNOSIS — C8330 Diffuse large B-cell lymphoma, unspecified site: Secondary | ICD-10-CM

## 2014-04-04 DIAGNOSIS — Z5111 Encounter for antineoplastic chemotherapy: Secondary | ICD-10-CM

## 2014-04-04 DIAGNOSIS — N183 Chronic kidney disease, stage 3 unspecified: Secondary | ICD-10-CM

## 2014-04-04 DIAGNOSIS — D630 Anemia in neoplastic disease: Secondary | ICD-10-CM

## 2014-04-04 DIAGNOSIS — C8590 Non-Hodgkin lymphoma, unspecified, unspecified site: Secondary | ICD-10-CM

## 2014-04-04 LAB — CBC WITH DIFFERENTIAL/PLATELET
BASO%: 1.4 % (ref 0.0–2.0)
Basophils Absolute: 0.1 10*3/uL (ref 0.0–0.1)
EOS%: 0.3 % (ref 0.0–7.0)
Eosinophils Absolute: 0 10*3/uL (ref 0.0–0.5)
HCT: 33.1 % — ABNORMAL LOW (ref 38.4–49.9)
HGB: 10.6 g/dL — ABNORMAL LOW (ref 13.0–17.1)
LYMPH%: 15 % (ref 14.0–49.0)
MCH: 27.1 pg — ABNORMAL LOW (ref 27.2–33.4)
MCHC: 32.1 g/dL (ref 32.0–36.0)
MCV: 84.4 fL (ref 79.3–98.0)
MONO#: 0.8 10*3/uL (ref 0.1–0.9)
MONO%: 7.7 % (ref 0.0–14.0)
NEUT#: 7.7 10*3/uL — ABNORMAL HIGH (ref 1.5–6.5)
NEUT%: 75.6 % — AB (ref 39.0–75.0)
Platelets: 256 10*3/uL (ref 140–400)
RBC: 3.92 10*6/uL — AB (ref 4.20–5.82)
RDW: 18.6 % — ABNORMAL HIGH (ref 11.0–14.6)
WBC: 10.2 10*3/uL (ref 4.0–10.3)
lymph#: 1.5 10*3/uL (ref 0.9–3.3)

## 2014-04-04 LAB — COMPREHENSIVE METABOLIC PANEL (CC13)
ALBUMIN: 2.8 g/dL — AB (ref 3.5–5.0)
ALT: 21 U/L (ref 0–55)
AST: 25 U/L (ref 5–34)
Alkaline Phosphatase: 83 U/L (ref 40–150)
Anion Gap: 10 mEq/L (ref 3–11)
BILIRUBIN TOTAL: 0.31 mg/dL (ref 0.20–1.20)
BUN: 24.1 mg/dL (ref 7.0–26.0)
CO2: 27 mEq/L (ref 22–29)
CREATININE: 0.9 mg/dL (ref 0.7–1.3)
Calcium: 9.2 mg/dL (ref 8.4–10.4)
Chloride: 102 mEq/L (ref 98–109)
EGFR: 75 mL/min/{1.73_m2} — AB (ref >90)
GLUCOSE: 133 mg/dL (ref 70–140)
POTASSIUM: 3.9 meq/L (ref 3.5–5.1)
Sodium: 140 mEq/L (ref 136–145)
Total Protein: 6.3 g/dL — ABNORMAL LOW (ref 6.4–8.3)

## 2014-04-04 LAB — URIC ACID (CC13): Uric Acid, Serum: 2.9 mg/dl (ref 2.6–7.4)

## 2014-04-04 LAB — LACTATE DEHYDROGENASE (CC13): LDH: 229 U/L (ref 125–245)

## 2014-04-04 LAB — CEA: CEA: 0.5 ng/mL (ref 0.0–5.0)

## 2014-04-04 MED ORDER — DEXAMETHASONE SODIUM PHOSPHATE 20 MG/5ML IJ SOLN
20.0000 mg | Freq: Once | INTRAMUSCULAR | Status: AC
  Administered 2014-04-04: 20 mg via INTRAVENOUS

## 2014-04-04 MED ORDER — ACETAMINOPHEN 325 MG PO TABS
650.0000 mg | ORAL_TABLET | Freq: Once | ORAL | Status: AC
  Administered 2014-04-04: 650 mg via ORAL

## 2014-04-04 MED ORDER — SODIUM CHLORIDE 0.9 % IV SOLN
1.0000 mg | Freq: Once | INTRAVENOUS | Status: AC
  Administered 2014-04-04: 1 mg via INTRAVENOUS
  Filled 2014-04-04: qty 1

## 2014-04-04 MED ORDER — ONDANSETRON 16 MG/50ML IVPB (CHCC)
INTRAVENOUS | Status: AC
  Filled 2014-04-04: qty 16

## 2014-04-04 MED ORDER — SODIUM CHLORIDE 0.9 % IJ SOLN
10.0000 mL | INTRAMUSCULAR | Status: DC | PRN
  Administered 2014-04-04: 10 mL
  Filled 2014-04-04: qty 10

## 2014-04-04 MED ORDER — DIPHENHYDRAMINE HCL 25 MG PO CAPS
ORAL_CAPSULE | ORAL | Status: AC
  Filled 2014-04-04: qty 1

## 2014-04-04 MED ORDER — HEPARIN SOD (PORK) LOCK FLUSH 100 UNIT/ML IV SOLN
500.0000 [IU] | Freq: Once | INTRAVENOUS | Status: AC | PRN
  Administered 2014-04-04: 500 [IU]
  Filled 2014-04-04: qty 5

## 2014-04-04 MED ORDER — DEXAMETHASONE SODIUM PHOSPHATE 20 MG/5ML IJ SOLN
INTRAMUSCULAR | Status: AC
  Filled 2014-04-04: qty 5

## 2014-04-04 MED ORDER — SODIUM CHLORIDE 0.9 % IV SOLN
400.0000 mg/m2 | Freq: Once | INTRAVENOUS | Status: AC
  Administered 2014-04-04: 800 mg via INTRAVENOUS
  Filled 2014-04-04: qty 40

## 2014-04-04 MED ORDER — DIPHENHYDRAMINE HCL 25 MG PO CAPS
25.0000 mg | ORAL_CAPSULE | Freq: Once | ORAL | Status: AC
  Administered 2014-04-04: 25 mg via ORAL

## 2014-04-04 MED ORDER — SODIUM CHLORIDE 0.9 % IV SOLN
Freq: Once | INTRAVENOUS | Status: AC
  Administered 2014-04-04: 09:00:00 via INTRAVENOUS

## 2014-04-04 MED ORDER — DOXORUBICIN HCL CHEMO IV INJECTION 2 MG/ML
25.0000 mg/m2 | Freq: Once | INTRAVENOUS | Status: AC
  Administered 2014-04-04: 50 mg via INTRAVENOUS
  Filled 2014-04-04: qty 25

## 2014-04-04 MED ORDER — FAMOTIDINE IN NACL 20-0.9 MG/50ML-% IV SOLN
20.0000 mg | Freq: Two times a day (BID) | INTRAVENOUS | Status: DC
  Administered 2014-04-04: 20 mg via INTRAVENOUS

## 2014-04-04 MED ORDER — ONDANSETRON 16 MG/50ML IVPB (CHCC)
16.0000 mg | Freq: Once | INTRAVENOUS | Status: AC
  Administered 2014-04-04: 16 mg via INTRAVENOUS

## 2014-04-04 MED ORDER — ACETAMINOPHEN 325 MG PO TABS
ORAL_TABLET | ORAL | Status: AC
  Filled 2014-04-04: qty 2

## 2014-04-04 MED ORDER — FAMOTIDINE IN NACL 20-0.9 MG/50ML-% IV SOLN
INTRAVENOUS | Status: AC
  Filled 2014-04-04: qty 50

## 2014-04-04 MED ORDER — SODIUM CHLORIDE 0.9 % IV SOLN
375.0000 mg/m2 | Freq: Once | INTRAVENOUS | Status: AC
  Administered 2014-04-04: 700 mg via INTRAVENOUS
  Filled 2014-04-04: qty 70

## 2014-04-04 NOTE — Progress Notes (Signed)
[facility] blood return noted before, during and after Adriamycin. 

## 2014-04-04 NOTE — Progress Notes (Signed)
[place]  [phone] [phone]     ID: Erik Ramirez DOB: [birth year]  Erik#: [id number]  YIR#:[id number]  Patient Care Team: Valaria Good. [name], MD as PCP - General (Internal Medicine) OTHER MD:  CHIEF COMPLAINT: large cell diffuse B-cell non-Hodgkin's lymphoma  CURRENT TREATMENT: dose reduced CHOP/rituximab   HISTORY OF PRESENT ILLNESS: From the original intake note:  "Erik Ramirez developed abdominal pain and distension sometime mid August 2015. He thought this was diverticular disease. Eventually he brought these concerns to Dr [name] and per patient's report and chart review a CT of the abd/pelvis was obtained which showed significant adenopathy. Biopsy 12/13/2013 at [facility] reportedly showed a large cell, B-cell lymphoma. The patient has developed "B" symptoms including unexplained fatigue, fevers and drenching sweats. He presented to [facility] 01/05/2014 with worsening shortness of breath. Workup here included a chest CT 01/05/2014 which showed diffuse upper abdominal and mediastinal adenopathy, with bilateral pleural effusions. V/Q scan was low probability for PE and bilateral LE dopples were negative for DVT. Echo 01/06/2014 showed an EF or 55-60%. He developed A-fib 01/06/2014 and has been started on cardizem. Other problems include acute renal injury, DM, HTN, dyslipidemia with atherosclerosis noted on CT scans and OSA requiring norcturnal C-pap"  The patient's subsequent history is as detailed below  INTERVAL HISTORY: Erik. Ramirez returns today accompanied by his wife for follow-up of Erik. Ramirez' non-Hodgkin's lymphoma. Today is day 1 cycle 5 of a minimum of 8 planned cycles of treatment  REVIEW OF SYSTEMS: Patient continues to tolerate chemo well. Has mild fatigue, but this is not interfering with his regular activities. Looking forward to spending Christmas with his family. Denies fevers. He has some sinus drainage, but no shortness  of breath, no significant cough, and no change in bowel or bladder habits. He feels anxious but not depressed. A detailed review of systems today was otherwise stable  PAST MEDICAL HISTORY: Past Medical History  Diagnosis Date  . Hypertension   . Hyperlipemia   . Anxiety   . Lymphadenopathy, abdominal   . Urethral stricture   . Anemia   . BPPV (benign paroxysmal positional vertigo)   . Lymphoma   . Diabetes mellitus without complication   . Obstructive sleep apnea     PAST SURGICAL HISTORY: Past Surgical History  Procedure Laterality Date  . Back surgery    . Prostate surgery    . Tonsillectomy      FAMILY HISTORY History reviewed. No pertinent family history. Patient's father had a "muscle disease" and died from unclear causes age 50. Patient's mother died from heartproblems age 32. The patient had 1 brother, 5 sisters. 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 He and his wife Erik Ramirez have ben married 32 years. Son Erik Ramirez lives in [place] and works in Press photographer. Son Erik Ramirez lives in [place] and is retired (disabled). Son Erik Ramirez runs a maintenance shop in [place], where he lives. Son Erik Ramirez works in telephones in [place]. Erik Ramirez's wife, Erik Ramirez, is an Therapist, sports and worked for [employer] many years. She now works for [employer].The patient has 8 gch and 6 ggch. He is a Psychologist, forensic    ADVANCED DIRECTIVES: place. Erik Ramirez tells me "if they can't do anything for me" he would not want resuscitation or life support. His wife Erik Ramirez is his health care POA, with son Erik Ramirez listed second.   HEALTH MAINTENANCE:  History  Substance Use Topics  . Smoking status: Never Smoker   . Smokeless tobacco: Not on file  . Alcohol Use: No     Colonoscopy:  PAP:  Bone density:  Lipid panel:  Allergies  Allergen Reactions  . Rituximab Other (See Comments)    Developed wheezing & rigors when infusion rate  increased to 100 mg/hr.  Tolerates rate of 50 mg/hr.  . Caffeine Other (See Comments)    Makes patient feel like he's going to die  . Morphine And Related Other (See Comments)    excitability  . Penicillins Rash    Current Outpatient Prescriptions  Medication Sig Dispense Refill  . allopurinol (ZYLOPRIM) 300 MG tablet Take 1 tablet (300 mg total) by mouth daily. 90 tablet 1  . aspirin 325 MG tablet Take 1 tablet (325 mg total) by mouth daily. 30 tablet 0  . ciprofloxacin (CIPRO) 500 MG tablet Take 1 tablet (500 mg total) by mouth 2 (two) times daily. 10 tablet 0  . clorazepate (TRANXENE) 7.5 MG tablet Take 1 tablet (7.5 mg total) by mouth 2 (two) times daily as needed for anxiety or sleep. 90 tablet 0  . clotrimazole-betamethasone (LOTRISONE) cream Apply 1 application topically 2 (two) times daily.    Marland Kitchen diltiazem (CARDIZEM CD) 120 MG 24 hr capsule Take 1 capsule (120 mg total) by mouth daily. 90 capsule 1  . feeding supplement, ENSURE COMPLETE, (ENSURE COMPLETE) LIQD Take 237 mLs by mouth daily. 20 Bottle 1  . fluconazole (DIFLUCAN) 100 MG tablet Take 1 tablet (100 mg total) by mouth daily. 30 tablet 5  . glucose blood test strip ONE TOUCH ULTRA .CHECK BLOOD SUGAR AC AND HS DAILY 480 each 3  . insulin aspart (NOVOLOG) 100 UNIT/ML injection Inject 0-20 Units into the skin 3 (three) times daily with meals. 10 mL 11  . lidocaine-prilocaine (EMLA) cream Apply 1 application topically as needed. APPLY TO PORT SITE 1 HOUR PRIOR TO THERAPY. DO NOT RUB IN. COVER WITH SARAN WRAP. 30 g 2  . metoprolol (LOPRESSOR) 50 MG tablet Take 25 mg by mouth 2 (two) times daily.      . miconazole (MICATIN) 2 % cream Apply 1 application topically 2 (two) times daily. Apply to groin area twice daily until clear 45 g 0  . naproxen (NAPROSYN) 375 MG tablet Take 1 tablet (375 mg total) by mouth 3 (three) times daily with meals. 20 tablet 0  . nystatin (MYCOSTATIN) 100000 UNIT/ML suspension SWISH AND SWALLOW 5 MLS BY  MOUTH FOUR TIMES DAILY 240 mL 3  . ondansetron (ZOFRAN) 8 MG tablet Take 1 tablet (8 mg total) by mouth every 12 (twelve) hours. 20 tablet 0  . ondansetron (ZOFRAN) 8 MG tablet Take 8 mg by mouth 2 (two) times daily as needed for nausea or vomiting.    . predniSONE (DELTASONE) 20 MG tablet Take 3 tablets (60 mg total) by mouth daily with breakfast. 15 tablet 5  . simvastatin (ZOCOR) 20 MG tablet Take 1 tablet (20 mg total) by mouth daily. 90 tablet 3  . traMADol (ULTRAM) 50 MG tablet Take 50 mg by mouth every 6 (six) hours as needed for moderate pain.     No current facility-administered medications for this visit.   Facility-Administered Medications Ordered in Other Visits  Medication Dose Route Frequency Provider Last Rate Last Dose  . famotidine (PEPCID) IVPB 20 mg  20 mg Intravenous Q12H Chauncey Cruel, MD   20 mg at 04/04/14 1117  . heparin  lock flush 100 unit/mL  500 Units Intracatheter Once PRN Chauncey Cruel, MD      . riTUXimab (RITUXAN) 700 mg in sodium chloride 0.9 % 250 mL (2.1875 mg/mL) chemo infusion  375 mg/m2 (Treatment Plan Adjusted) Intravenous Once Chauncey Cruel, MD      . sodium chloride 0.9 % injection 10 mL  10 mL Intracatheter PRN Chauncey Cruel, MD        OBJECTIVE: elderly white maN in no acute distress Filed Vitals:   04/04/14 0832  BP: 148/65  Pulse: 85  Temp: 97.7 F (36.5 C)  Resp: 19     Body mass index is 24.96 kg/(m^2).    ECOG FS:1 - Symptomatic but completely ambulatory  Ocular: Sclerae unicteric, pupils round and equal Ear-nose-throat: Oropharynx shows minimal thrush Lymphatic: I do not palpate any left supraclavicular adenopathy Lungs no rales or rhonchi Heart regular rate and rhythm today Abd soft, nontender, positive bowel sounds, no splenomegaly palpated MSK no focal spinal tenderness Neuro: non-focal, well-oriented, positive affect  LAB RESULTS:  CMP     Component Value Date/Time   NA 140 04/04/2014 0756   NA 137  01/17/2014 0021   K 3.9 04/04/2014 0756   K 3.9 01/17/2014 0021   CL 98 01/17/2014 0021   CO2 27 04/04/2014 0756   CO2 24 01/12/2014 0418   GLUCOSE 133 04/04/2014 0756   GLUCOSE 116* 01/17/2014 0021   BUN 24.1 04/04/2014 0756   BUN 22 01/17/2014 0021   CREATININE 0.9 04/04/2014 0756   CREATININE 0.90 01/17/2014 0021   CALCIUM 9.2 04/04/2014 0756   CALCIUM 7.9* 01/12/2014 0418   PROT 6.3* 04/04/2014 0756   PROT 5.3* 01/12/2014 0418   ALBUMIN 2.8* 04/04/2014 0756   ALBUMIN 2.1* 01/12/2014 0418   AST 25 04/04/2014 0756   AST 29 01/12/2014 0418   ALT 21 04/04/2014 0756   ALT 31 01/12/2014 0418   ALKPHOS 83 04/04/2014 0756   ALKPHOS 100 01/12/2014 0418   BILITOT 0.31 04/04/2014 0756   BILITOT 0.6 01/12/2014 0418   GFRNONAA 51* 01/12/2014 0418   GFRAA 59* 01/12/2014 0418    INo results found for: SPEP, UPEP  Lab Results  Component Value Date   WBC 10.2 04/04/2014   NEUTROABS 7.7* 04/04/2014   HGB 10.6* 04/04/2014   HCT 33.1* 04/04/2014   MCV 84.4 04/04/2014   PLT 256 04/04/2014      Chemistry      Component Value Date/Time   NA 140 04/04/2014 0756   NA 137 01/17/2014 0021   K 3.9 04/04/2014 0756   K 3.9 01/17/2014 0021   CL 98 01/17/2014 0021   CO2 27 04/04/2014 0756   CO2 24 01/12/2014 0418   BUN 24.1 04/04/2014 0756   BUN 22 01/17/2014 0021   CREATININE 0.9 04/04/2014 0756   CREATININE 0.90 01/17/2014 0021      Component Value Date/Time   CALCIUM 9.2 04/04/2014 0756   CALCIUM 7.9* 01/12/2014 0418   ALKPHOS 83 04/04/2014 0756   ALKPHOS 100 01/12/2014 0418   AST 25 04/04/2014 0756   AST 29 01/12/2014 0418   ALT 21 04/04/2014 0756   ALT 31 01/12/2014 0418   BILITOT 0.31 04/04/2014 0756   BILITOT 0.6 01/12/2014 0418       No results found for: LABCA2  No components found for: [id number]  No results for input(s): INR in the last 168 hours.  Urinalysis    Component Value Date/Time   COLORURINE YELLOW 01/17/2014 0029  APPEARANCEUR CLEAR 01/17/2014  0029   LABSPEC 1.019 01/17/2014 0029   PHURINE 5.5 01/17/2014 0029   GLUCOSEU NEGATIVE 01/17/2014 0029   HGBUR NEGATIVE 01/17/2014 0029   BILIRUBINUR NEGATIVE 01/17/2014 0029   KETONESUR NEGATIVE 01/17/2014 0029   PROTEINUR NEGATIVE 01/17/2014 0029   UROBILINOGEN 1.0 01/17/2014 0029   NITRITE NEGATIVE 01/17/2014 0029   LEUKOCYTESUR TRACE* 01/17/2014 0029    STUDIES: Nm Pet Image Restag (ps) Skull Base To Thigh  03/09/2014   CLINICAL DATA:  Subsequent treatment strategy for diffuse large B-cell lymphoma.  EXAM: NUCLEAR MEDICINE PET SKULL BASE TO THIGH  TECHNIQUE: 8.1 mCi F-18 FDG was injected intravenously. Full-ring PET imaging was performed from the skull base to thigh after the radiotracer. CT data was obtained and used for attenuation correction and anatomic localization.  FASTING BLOOD GLUCOSE:  Value: 127 mg/dl  COMPARISON:  CT chest dated 01/05/2014. CT abdomen pelvis dated 12/13/2013.  FINDINGS: NECK  Mild focal hypermetabolism above the level of the right vocal cord, max SUV 4.5 (PET image 46), indeterminate. No corresponding mass on CT.  11 mm short axis left supraclavicular node (series 4/image 47), previously 15 mm, max SUV 10.9.  CHEST  No suspicious pulmonary nodules.  Residual 12 mm subcarinal node (series 4/image 80), non FDG avid. Prior mediastinal lymphadenopathy is otherwise resolved.  No hypermetabolic mediastinal or hilar lymphadenopathy.  Coronary atherosclerosis.  Right chest port.  ABDOMEN/PELVIS  No abnormal hypermetabolic activity within the liver, pancreas, adrenal glands, or spleen.  Wall thickening/aneurysmal dilatation of the terminal ileum (series 4/ image 160), with associated hypermetabolism (max SUV 21.8), compatible with lymphomatous involvement.  Extensive hypermetabolic lymphadenopathy in the abdomen/retroperitoneum, improved, including:  --1.5 cm short axis portacaval node (series 4/ image 126), max SUV 12.6, previously 3.1 cm  --10 mm short axis retrocaval node  (series 4/ image 133), max SUV 9.3, unchanged  --3.1 cm jejunal mesenteric node (series 4/image 440), max SUV 19.8, previously 3.2 cm  Vascular calcifications. Moderate fat containing right inguinal hernia. Bladder is mildly thick-walled but underdistended.  Status post prostatectomy  SKELETON  Mild wall thickening involving the sternum and medial clavicles/sternoclavicular junctions, max SUV 4.2. This appearance may be degenerative, but lymphomatous involvement is not excluded.  Otherwise, heterogeneous marrow uptake without findings suspicious for osseous metastases.  IMPRESSION: Lymphomatous involvement of the terminal ileum.  Upper abdominal/ retroperitoneal nodal metastases, mildly improved.  Left supraclavicular node. Thoracic lymphadenopathy has otherwise resolved.  Mild hypermetabolism involving the sternum, possibly degenerative, lymphomatous involvement not entirely excluded.   Electronically Signed   By: Julian Hy M.D.   On: 03/09/2014 09:57    ASSESSMENT: 80 y.o. [name] with a new diagnosis of diffuse large-cell, B-cell non-Hodgkin's lymphoma  (1) rituximab started 01/07/2014, with initial infusion reaction but able to receive entire dose that evening; to be repeated every 21 days   (2) dose-reduced CHOP/Rituxan started 01/10/2014--to be repeated every 21 days.  (a) patient was able to tolerate rituxan well starting with second infusion  (b) prednisone inadvertently left out of cycle 2, resumed cycle 3  (3) tumor lysis syndrome while hospitalized: very favorable initial reaction to treatment; following LDH and uric acid  (4) AKI while hospitalized: creatinine has normalized since   PLAN: Erik Stepter is having a good early response to his attenuated chemotherapy. He continues to have high levels of SUV uptake at the terminal ileum as opposed to other areas, but if this persists at the next PET scan we may need to do a colonoscopy and  biopsies of that area to make sure we  don't have a separate problem. Similarly, some lymph nodes have not significantly changed. In short for what I would not want is for [place] to be dealing with a primary small bowel tumor in addition to his lymphoma.  Recommend that he proceed with cycle 5 today. We will repeat a PET scan after cycle 6 with a view to likely continuing to a total of 8 cycles and then following with maintenance rituximab.  The patient has a good understanding of the overall plan. He agrees with it. He knows the goal of treatment in his case is cure. He will call with any problems that may develop before his next visit here.  Mikey Bussing, NP   04/04/2014 11:19 AM

## 2014-04-04 NOTE — Patient Instructions (Signed)
[facility] Discharge Instructions for Patients Receiving Chemotherapy  Today you received the following chemotherapy agents :  Rituxan, Cytoxan, Adriamycin, Vincristine   To help prevent nausea and vomiting after your treatment, we encourage you to take your nausea medication.  If you develop nausea and vomiting that is not controlled by your nausea medication, call the clinic.   BELOW ARE SYMPTOMS THAT SHOULD BE REPORTED IMMEDIATELY:  *FEVER GREATER THAN 100.5 F  *CHILLS WITH OR WITHOUT FEVER  NAUSEA AND VOMITING THAT IS NOT CONTROLLED WITH YOUR NAUSEA MEDICATION  *UNUSUAL SHORTNESS OF BREATH  *UNUSUAL BRUISING OR BLEEDING  TENDERNESS IN MOUTH AND THROAT WITH OR WITHOUT PRESENCE OF ULCERS  *URINARY PROBLEMS  *BOWEL PROBLEMS  UNUSUAL RASH Items with * indicate a potential emergency and should be followed up as soon as possible.  Feel free to call the clinic you have any questions or concerns. The clinic phone number is [phone].

## 2014-04-05 ENCOUNTER — Other Ambulatory Visit: Payer: Self-pay | Admitting: Emergency Medicine

## 2014-04-05 ENCOUNTER — Ambulatory Visit (HOSPITAL_BASED_OUTPATIENT_CLINIC_OR_DEPARTMENT_OTHER): Payer: Medicare Other

## 2014-04-05 ENCOUNTER — Other Ambulatory Visit: Payer: Self-pay | Admitting: Oncology

## 2014-04-05 DIAGNOSIS — C8330 Diffuse large B-cell lymphoma, unspecified site: Secondary | ICD-10-CM

## 2014-04-05 MED ORDER — INSULIN ASPART 100 UNIT/ML ~~LOC~~ SOLN: 0.0000 [IU] | Freq: Three times a day (TID) | SUBCUTANEOUS | Status: DC

## 2014-04-05 MED ORDER — CLORAZEPATE DIPOTASSIUM 7.5 MG PO TABS: 7.5000 mg | ORAL_TABLET | Freq: Two times a day (BID) | ORAL | Status: DC | PRN

## 2014-04-05 MED ORDER — GLUCOSE BLOOD VI STRP: ORAL_STRIP | Status: DC

## 2014-04-05 MED ORDER — PEGFILGRASTIM INJECTION 6 MG/0.6ML ~~LOC~~
6.0000 mg | PREFILLED_SYRINGE | Freq: Once | SUBCUTANEOUS | Status: AC
  Administered 2014-04-05: 6 mg via SUBCUTANEOUS
  Filled 2014-04-05: qty 0.6

## 2014-04-05 NOTE — Patient Instructions (Signed)
Pegfilgrastim injection What is this medicine? PEGFILGRASTIM (peg fil GRA stim) is a long-acting granulocyte colony-stimulating factor that stimulates the growth of neutrophils, a type of white blood cell important in the body's fight against infection. It is used to reduce the incidence of fever and infection in patients with certain types of cancer who are receiving chemotherapy that affects the bone marrow. This medicine may be used for other purposes; ask your health care provider or pharmacist if you have questions. COMMON BRAND NAME(S): Neulasta What should I tell my health care provider before I take this medicine? They need to know if you have any of these conditions: -latex allergy -ongoing radiation therapy -sickle cell disease -skin reactions to acrylic adhesives (On-Body Injector only) -an unusual or allergic reaction to pegfilgrastim, filgrastim, other medicines, foods, dyes, or preservatives -pregnant or trying to get pregnant -breast-feeding How should I use this medicine? This medicine is for injection under the skin. If you get this medicine at home, you will be taught how to prepare and give the pre-filled syringe or how to use the On-body Injector. Refer to the patient Instructions for Use for detailed instructions. Use exactly as directed. Take your medicine at regular intervals. Do not take your medicine more often than directed. It is important that you put your used needles and syringes in a special sharps container. Do not put them in a trash can. If you do not have a sharps container, call your pharmacist or healthcare provider to get one. Talk to your pediatrician regarding the use of this medicine in children. Special care may be needed. Overdosage: If you think you have taken too much of this medicine contact a poison control center or emergency room at once. NOTE: This medicine is only for you. Do not share this medicine with others. What if I miss a dose? It is  important not to miss your dose. Call your doctor or health care professional if you miss your dose. If you miss a dose due to an On-body Injector failure or leakage, a new dose should be administered as soon as possible using a single prefilled syringe for manual use. What may interact with this medicine? Interactions have not been studied. Give your health care provider a list of all the medicines, herbs, non-prescription drugs, or dietary supplements you use. Also tell them if you smoke, drink alcohol, or use illegal drugs. Some items may interact with your medicine. This list may not describe all possible interactions. Give your health care provider a list of all the medicines, herbs, non-prescription drugs, or dietary supplements you use. Also tell them if you smoke, drink alcohol, or use illegal drugs. Some items may interact with your medicine. What should I watch for while using this medicine? You may need blood work done while you are taking this medicine. If you are going to need a MRI, CT scan, or other procedure, tell your doctor that you are using this medicine (On-Body Injector only). What side effects may I notice from receiving this medicine? Side effects that you should report to your doctor or health care professional as soon as possible: -allergic reactions like skin rash, itching or hives, swelling of the face, lips, or tongue -dizziness -fever -pain, redness, or irritation at site where injected -pinpoint red spots on the skin -shortness of breath or breathing problems -stomach or side pain, or pain at the shoulder -swelling -tiredness -trouble passing urine Side effects that usually do not require medical attention (report to your doctor   or health care professional if they continue or are bothersome): -bone pain -muscle pain This list may not describe all possible side effects. Call your doctor for medical advice about side effects. You may report side effects to FDA at  1-800-FDA-1088. Where should I keep my medicine? Keep out of the reach of children. Store pre-filled syringes in a refrigerator between 2 and 8 degrees C (36 and 46 degrees F). Do not freeze. Keep in carton to protect from light. Throw away this medicine if it is left out of the refrigerator for more than 48 hours. Throw away any unused medicine after the expiration date. NOTE: This sheet is a summary. It may not cover all possible information. If you have questions about this medicine, talk to your doctor, pharmacist, or health care provider.  2015, [employer]. (2013-07-01 16:14:05)  

## 2014-04-12 ENCOUNTER — Inpatient Hospital Stay (HOSPITAL_COMMUNITY)
Admission: EM | Admit: 2014-04-12 | Discharge: 2014-04-18 | DRG: 808 | Disposition: A | Discharge status: Home Health | Payer: Medicare Other | Attending: Internal Medicine | Admitting: Internal Medicine

## 2014-04-12 ENCOUNTER — Ambulatory Visit (HOSPITAL_BASED_OUTPATIENT_CLINIC_OR_DEPARTMENT_OTHER): Payer: Medicare Other | Admitting: Oncology

## 2014-04-12 ENCOUNTER — Other Ambulatory Visit (HOSPITAL_BASED_OUTPATIENT_CLINIC_OR_DEPARTMENT_OTHER): Payer: Medicare Other

## 2014-04-12 ENCOUNTER — Emergency Department (HOSPITAL_COMMUNITY): Payer: Medicare Other

## 2014-04-12 ENCOUNTER — Encounter (HOSPITAL_COMMUNITY): Payer: Self-pay

## 2014-04-12 VITALS — BP 125/59 | HR 70 | Temp 98.1°F | Resp 18 | Ht 67.0 in | Wt 162.5 lb

## 2014-04-12 DIAGNOSIS — Z79899 Other long term (current) drug therapy: Secondary | ICD-10-CM | POA: Diagnosis not present

## 2014-04-12 DIAGNOSIS — D709 Neutropenia, unspecified: Secondary | ICD-10-CM | POA: Diagnosis present

## 2014-04-12 DIAGNOSIS — I248 Other forms of acute ischemic heart disease: Secondary | ICD-10-CM | POA: Diagnosis present

## 2014-04-12 DIAGNOSIS — T451X5A Adverse effect of antineoplastic and immunosuppressive drugs, initial encounter: Secondary | ICD-10-CM | POA: Diagnosis present

## 2014-04-12 DIAGNOSIS — N183 Chronic kidney disease, stage 3 unspecified: Secondary | ICD-10-CM

## 2014-04-12 DIAGNOSIS — D630 Anemia in neoplastic disease: Secondary | ICD-10-CM

## 2014-04-12 DIAGNOSIS — E119 Type 2 diabetes mellitus without complications: Secondary | ICD-10-CM

## 2014-04-12 DIAGNOSIS — R5081 Fever presenting with conditions classified elsewhere: Secondary | ICD-10-CM | POA: Diagnosis present

## 2014-04-12 DIAGNOSIS — W19XXXA Unspecified fall, initial encounter: Secondary | ICD-10-CM

## 2014-04-12 DIAGNOSIS — R509 Fever, unspecified: Secondary | ICD-10-CM

## 2014-04-12 DIAGNOSIS — E43 Unspecified severe protein-calorie malnutrition: Secondary | ICD-10-CM | POA: Diagnosis present

## 2014-04-12 DIAGNOSIS — B370 Candidal stomatitis: Secondary | ICD-10-CM | POA: Diagnosis present

## 2014-04-12 DIAGNOSIS — R195 Other fecal abnormalities: Secondary | ICD-10-CM | POA: Diagnosis present

## 2014-04-12 DIAGNOSIS — E876 Hypokalemia: Secondary | ICD-10-CM | POA: Diagnosis present

## 2014-04-12 DIAGNOSIS — C8330 Diffuse large B-cell lymphoma, unspecified site: Secondary | ICD-10-CM | POA: Diagnosis present

## 2014-04-12 DIAGNOSIS — G4733 Obstructive sleep apnea (adult) (pediatric): Secondary | ICD-10-CM | POA: Diagnosis present

## 2014-04-12 DIAGNOSIS — I129 Hypertensive chronic kidney disease with stage 1 through stage 4 chronic kidney disease, or unspecified chronic kidney disease: Secondary | ICD-10-CM | POA: Diagnosis present

## 2014-04-12 DIAGNOSIS — J029 Acute pharyngitis, unspecified: Secondary | ICD-10-CM | POA: Diagnosis present

## 2014-04-12 DIAGNOSIS — Z6824 Body mass index (BMI) 24.0-24.9, adult: Secondary | ICD-10-CM | POA: Diagnosis not present

## 2014-04-12 DIAGNOSIS — F419 Anxiety disorder, unspecified: Secondary | ICD-10-CM | POA: Diagnosis present

## 2014-04-12 DIAGNOSIS — R710 Precipitous drop in hematocrit: Secondary | ICD-10-CM | POA: Diagnosis present

## 2014-04-12 DIAGNOSIS — C8590 Non-Hodgkin lymphoma, unspecified, unspecified site: Secondary | ICD-10-CM

## 2014-04-12 DIAGNOSIS — D62 Acute posthemorrhagic anemia: Secondary | ICD-10-CM | POA: Diagnosis present

## 2014-04-12 DIAGNOSIS — E785 Hyperlipidemia, unspecified: Secondary | ICD-10-CM | POA: Diagnosis present

## 2014-04-12 DIAGNOSIS — Z7982 Long term (current) use of aspirin: Secondary | ICD-10-CM | POA: Diagnosis not present

## 2014-04-12 DIAGNOSIS — I10 Essential (primary) hypertension: Secondary | ICD-10-CM | POA: Diagnosis present

## 2014-04-12 DIAGNOSIS — I480 Paroxysmal atrial fibrillation: Secondary | ICD-10-CM | POA: Diagnosis present

## 2014-04-12 DIAGNOSIS — R778 Other specified abnormalities of plasma proteins: Secondary | ICD-10-CM | POA: Insufficient documentation

## 2014-04-12 DIAGNOSIS — D61810 Antineoplastic chemotherapy induced pancytopenia: Secondary | ICD-10-CM | POA: Diagnosis present

## 2014-04-12 DIAGNOSIS — R7989 Other specified abnormal findings of blood chemistry: Secondary | ICD-10-CM | POA: Insufficient documentation

## 2014-04-12 DIAGNOSIS — D649 Anemia, unspecified: Secondary | ICD-10-CM

## 2014-04-12 HISTORY — DX: Sleep apnea, unspecified: G47.30

## 2014-04-12 LAB — I-STAT CG4 LACTIC ACID, ED
LACTIC ACID, VENOUS: 1.69 mmol/L (ref 0.5–2.2)
Lactic Acid, Venous: 0.71 mmol/L (ref 0.5–2.2)

## 2014-04-12 LAB — CBC WITH DIFFERENTIAL/PLATELET
BASO%: 0.6 % (ref 0.0–2.0)
BASOS ABS: 0 10*3/uL (ref 0.0–0.1)
Basophils Absolute: 0 10*3/uL (ref 0.0–0.1)
Basophils Relative: 0 % (ref 0–1)
EOS ABS: 0 10*3/uL (ref 0.0–0.5)
EOS%: 2.5 % (ref 0.0–7.0)
Eosinophils Absolute: 0 10*3/uL (ref 0.0–0.7)
Eosinophils Relative: 2 % (ref 0–5)
HCT: 28.8 % — ABNORMAL LOW (ref 38.4–49.9)
HCT: 20 % — ABNORMAL LOW (ref 39.0–52.0)
HGB: 9.2 g/dL — ABNORMAL LOW (ref 13.0–17.1)
Hemoglobin: 6.5 g/dL — CL (ref 13.0–17.0)
LYMPH#: 0.7 10*3/uL — AB (ref 0.9–3.3)
LYMPH%: 40.1 % (ref 14.0–49.0)
Lymphocytes Relative: 55 % — ABNORMAL HIGH (ref 12–46)
Lymphs Abs: 1.3 10*3/uL (ref 0.7–4.0)
MCH: 26.9 pg — ABNORMAL LOW (ref 27.2–33.4)
MCH: 27.7 pg (ref 26.0–34.0)
MCHC: 31.9 g/dL — AB (ref 32.0–36.0)
MCHC: 32.5 g/dL (ref 30.0–36.0)
MCV: 84.2 fL (ref 79.3–98.0)
MCV: 85.1 fL (ref 78.0–100.0)
MONO ABS: 0.5 10*3/uL (ref 0.1–1.0)
MONO#: 0.3 10*3/uL (ref 0.1–0.9)
MONO%: 19.1 % — ABNORMAL HIGH (ref 0.0–14.0)
MONOS PCT: 21 % — AB (ref 3–12)
NEUT%: 37.7 % — ABNORMAL LOW (ref 39.0–75.0)
NEUTROS ABS: 0.6 10*3/uL — AB (ref 1.5–6.5)
NEUTROS ABS: 0.5 10*3/uL — AB (ref 1.7–7.7)
Neutrophils Relative %: 22 % — ABNORMAL LOW (ref 43–77)
PLATELETS: 129 10*3/uL — AB (ref 150–400)
Platelets: 116 10*3/uL — ABNORMAL LOW (ref 140–400)
RBC: 3.42 10*6/uL — AB (ref 4.20–5.82)
RBC: 2.35 MIL/uL — ABNORMAL LOW (ref 4.22–5.81)
RDW: 16.9 % — AB (ref 11.0–14.6)
RDW: 17.1 % — ABNORMAL HIGH (ref 11.5–15.5)
WBC: 1.6 10*3/uL — ABNORMAL LOW (ref 4.0–10.3)
WBC: 2.3 10*3/uL — AB (ref 4.0–10.5)

## 2014-04-12 LAB — URINALYSIS, ROUTINE W REFLEX MICROSCOPIC
Bilirubin Urine: NEGATIVE
Glucose, UA: NEGATIVE mg/dL
HGB URINE DIPSTICK: NEGATIVE
Ketones, ur: NEGATIVE mg/dL
Leukocytes, UA: NEGATIVE
NITRITE: NEGATIVE
PROTEIN: NEGATIVE mg/dL
SPECIFIC GRAVITY, URINE: 1.014 (ref 1.005–1.030)
UROBILINOGEN UA: 1 mg/dL (ref 0.0–1.0)
pH: 7.5 (ref 5.0–8.0)

## 2014-04-12 LAB — COMPREHENSIVE METABOLIC PANEL
ALBUMIN: 2.6 g/dL — AB (ref 3.5–5.2)
ALT: 21 U/L (ref 0–53)
AST: 23 U/L (ref 0–37)
Alkaline Phosphatase: 75 U/L (ref 39–117)
Anion gap: 5 (ref 5–15)
BUN: 13 mg/dL (ref 6–23)
CALCIUM: 7.8 mg/dL — AB (ref 8.4–10.5)
CO2: 29 mmol/L (ref 19–32)
CREATININE: 0.69 mg/dL (ref 0.50–1.35)
Chloride: 102 mEq/L (ref 96–112)
GFR calc Af Amer: >90 mL/min (ref >90)
GFR calc non Af Amer: 84 mL/min — ABNORMAL LOW (ref >90)
GLUCOSE: 144 mg/dL — AB (ref 70–99)
POTASSIUM: 3.3 mmol/L — AB (ref 3.5–5.1)
Sodium: 136 mmol/L (ref 135–145)
TOTAL PROTEIN: 5 g/dL — AB (ref 6.0–8.3)
Total Bilirubin: 0.3 mg/dL (ref 0.3–1.2)

## 2014-04-12 LAB — ABO/RH: ABO/RH(D): O POS

## 2014-04-12 LAB — RAPID STREP SCREEN (MED CTR MEBANE ONLY): STREPTOCOCCUS, GROUP A SCREEN (DIRECT): NEGATIVE

## 2014-04-12 MED ORDER — DILTIAZEM HCL ER COATED BEADS 120 MG PO CP24
120.0000 mg | ORAL_CAPSULE | Freq: Every day | ORAL | Status: DC
  Administered 2014-04-13 – 2014-04-18 (×6): 120 mg via ORAL
  Filled 2014-04-12 (×6): qty 1

## 2014-04-12 MED ORDER — SODIUM CHLORIDE 0.9 % IV SOLN
Freq: Once | INTRAVENOUS | Status: AC
  Administered 2014-04-13: 02:00:00 via INTRAVENOUS

## 2014-04-12 MED ORDER — LORATADINE 10 MG PO TABS
10.0000 mg | ORAL_TABLET | Freq: Every day | ORAL | Status: DC
  Administered 2014-04-13 – 2014-04-18 (×6): 10 mg via ORAL
  Filled 2014-04-12 (×6): qty 1

## 2014-04-12 MED ORDER — ACETAMINOPHEN 325 MG PO TABS: 650.0000 mg | ORAL_TABLET | Freq: Four times a day (QID) | ORAL | Status: DC | PRN

## 2014-04-12 MED ORDER — MICONAZOLE NITRATE 2 % EX CREA
1.0000 "application " | TOPICAL_CREAM | Freq: Two times a day (BID) | CUTANEOUS | Status: DC
  Administered 2014-04-15 – 2014-04-18 (×3): 1 via TOPICAL
  Filled 2014-04-12: qty 14

## 2014-04-12 MED ORDER — FOLIC ACID 1 MG PO TABS
1.0000 mg | ORAL_TABLET | Freq: Every day | ORAL | Status: DC
  Administered 2014-04-13 – 2014-04-18 (×6): 1 mg via ORAL
  Filled 2014-04-12 (×6): qty 1

## 2014-04-12 MED ORDER — ENSURE COMPLETE PO LIQD
237.0000 mL | ORAL | Status: DC
  Administered 2014-04-13 – 2014-04-18 (×6): 237 mL via ORAL

## 2014-04-12 MED ORDER — METOPROLOL TARTRATE 25 MG PO TABS
25.0000 mg | ORAL_TABLET | Freq: Two times a day (BID) | ORAL | Status: DC
  Administered 2014-04-13 – 2014-04-18 (×12): 25 mg via ORAL
  Filled 2014-04-12 (×12): qty 1

## 2014-04-12 MED ORDER — DOCUSATE SODIUM 100 MG PO CAPS
100.0000 mg | ORAL_CAPSULE | Freq: Two times a day (BID) | ORAL | Status: DC
  Administered 2014-04-13 – 2014-04-18 (×11): 100 mg via ORAL
  Filled 2014-04-12 (×12): qty 1

## 2014-04-12 MED ORDER — SODIUM CHLORIDE 0.9 % IJ SOLN
INTRAMUSCULAR | Status: AC
  Filled 2014-04-12: qty 10

## 2014-04-12 MED ORDER — SODIUM CHLORIDE 0.9 % IV SOLN
INTRAVENOUS | Status: DC
  Administered 2014-04-13 – 2014-04-14 (×3): via INTRAVENOUS

## 2014-04-12 MED ORDER — ALLOPURINOL 300 MG PO TABS
300.0000 mg | ORAL_TABLET | Freq: Every day | ORAL | Status: DC
Start: 2014-04-13 — End: 2014-04-18
  Administered 2014-04-13 – 2014-04-18 (×6): 300 mg via ORAL
  Filled 2014-04-12 (×6): qty 1

## 2014-04-12 MED ORDER — SIMVASTATIN 20 MG PO TABS
20.0000 mg | ORAL_TABLET | Freq: Every day | ORAL | Status: DC
  Administered 2014-04-13 – 2014-04-18 (×6): 20 mg via ORAL
  Filled 2014-04-12 (×6): qty 1

## 2014-04-12 MED ORDER — DIPHENHYDRAMINE HCL 25 MG PO CAPS
25.0000 mg | ORAL_CAPSULE | Freq: Once | ORAL | Status: AC
  Administered 2014-04-13: 25 mg via ORAL
  Filled 2014-04-12: qty 1

## 2014-04-12 MED ORDER — CLORAZEPATE DIPOTASSIUM 7.5 MG PO TABS
7.5000 mg | ORAL_TABLET | ORAL | Status: DC
  Administered 2014-04-13 – 2014-04-18 (×6): 7.5 mg via ORAL
  Filled 2014-04-12 (×6): qty 1

## 2014-04-12 MED ORDER — TRAMADOL HCL 50 MG PO TABS
50.0000 mg | ORAL_TABLET | Freq: Four times a day (QID) | ORAL | Status: DC | PRN
  Administered 2014-04-15 – 2014-04-16 (×2): 50 mg via ORAL
  Filled 2014-04-12 (×3): qty 1

## 2014-04-12 MED ORDER — POTASSIUM CHLORIDE 10 MEQ/100ML IV SOLN
10.0000 meq | INTRAVENOUS | Status: AC
  Administered 2014-04-13 (×3): 10 meq via INTRAVENOUS
  Filled 2014-04-12 (×3): qty 100

## 2014-04-12 MED ORDER — ASPIRIN 325 MG PO TABS
325.0000 mg | ORAL_TABLET | Freq: Every day | ORAL | Status: DC
  Administered 2014-04-13 – 2014-04-18 (×6): 325 mg via ORAL
  Filled 2014-04-12 (×6): qty 1

## 2014-04-12 MED ORDER — ONDANSETRON HCL 4 MG PO TABS: 4.0000 mg | ORAL_TABLET | Freq: Four times a day (QID) | ORAL | Status: DC | PRN

## 2014-04-12 MED ORDER — FUROSEMIDE 10 MG/ML IJ SOLN
20.0000 mg | Freq: Once | INTRAMUSCULAR | Status: AC
  Administered 2014-04-13: 20 mg via INTRAVENOUS
  Filled 2014-04-12: qty 2

## 2014-04-12 MED ORDER — SODIUM CHLORIDE 0.9 % IV BOLUS (SEPSIS)
1000.0000 mL | Freq: Once | INTRAVENOUS | Status: AC
Start: 2014-04-12 — End: 2014-04-12
  Administered 2014-04-12: 1000 mL via INTRAVENOUS

## 2014-04-12 MED ORDER — ACETAMINOPHEN 325 MG PO TABS
650.0000 mg | ORAL_TABLET | Freq: Once | ORAL | Status: AC
  Administered 2014-04-13: 650 mg via ORAL
  Filled 2014-04-12: qty 2

## 2014-04-12 MED ORDER — ADULT MULTIVITAMIN W/MINERALS CH
1.0000 | ORAL_TABLET | Freq: Every day | ORAL | Status: DC
  Administered 2014-04-13 – 2014-04-18 (×6): 1 via ORAL
  Filled 2014-04-12 (×6): qty 1

## 2014-04-12 MED ORDER — CLOTRIMAZOLE 1 % EX CREA
1.0000 "application " | TOPICAL_CREAM | Freq: Two times a day (BID) | CUTANEOUS | Status: DC
  Administered 2014-04-15 – 2014-04-17 (×2): 1 via TOPICAL
  Filled 2014-04-12: qty 15

## 2014-04-12 MED ORDER — NYSTATIN 100000 UNIT/ML MT SUSP
5.0000 mL | Freq: Four times a day (QID) | OROMUCOSAL | Status: DC
  Administered 2014-04-13 – 2014-04-18 (×21): 500000 [IU] via ORAL
  Filled 2014-04-12 (×24): qty 5

## 2014-04-12 MED ORDER — FLUCONAZOLE 100 MG PO TABS
100.0000 mg | ORAL_TABLET | Freq: Every day | ORAL | Status: DC
  Administered 2014-04-13 – 2014-04-18 (×6): 100 mg via ORAL
  Filled 2014-04-12 (×6): qty 1

## 2014-04-12 MED ORDER — INSULIN ASPART 100 UNIT/ML ~~LOC~~ SOLN: 0.0000 [IU] | Freq: Three times a day (TID) | SUBCUTANEOUS | Status: DC

## 2014-04-12 MED ORDER — OXYCODONE HCL 5 MG PO TABS
5.0000 mg | ORAL_TABLET | ORAL | Status: DC | PRN
  Administered 2014-04-13: 5 mg via ORAL
  Filled 2014-04-12: qty 1

## 2014-04-12 MED ORDER — VITAMIN B-1 100 MG PO TABS
100.0000 mg | ORAL_TABLET | Freq: Every day | ORAL | Status: DC
  Administered 2014-04-13 – 2014-04-18 (×6): 100 mg via ORAL
  Filled 2014-04-12 (×6): qty 1

## 2014-04-12 MED ORDER — ACETAMINOPHEN 650 MG RE SUPP: 650.0000 mg | Freq: Four times a day (QID) | RECTAL | Status: DC | PRN

## 2014-04-12 MED ORDER — ONDANSETRON HCL 4 MG/2ML IJ SOLN: 4.0000 mg | Freq: Four times a day (QID) | INTRAMUSCULAR | Status: DC | PRN

## 2014-04-12 MED ORDER — INSULIN ASPART 100 UNIT/ML ~~LOC~~ SOLN
0.0000 [IU] | Freq: Three times a day (TID) | SUBCUTANEOUS | Status: DC
  Administered 2014-04-13: 1 [IU] via SUBCUTANEOUS
  Administered 2014-04-13: 2 [IU] via SUBCUTANEOUS
  Administered 2014-04-14 – 2014-04-17 (×7): 1 [IU] via SUBCUTANEOUS
  Administered 2014-04-18: 2 [IU] via SUBCUTANEOUS

## 2014-04-12 MED ORDER — LIDOCAINE-PRILOCAINE 2.5-2.5 % EX CREA: 1.0000 "application " | TOPICAL_CREAM | CUTANEOUS | Status: DC | PRN

## 2014-04-12 MED ORDER — SODIUM CHLORIDE 0.9 % IJ SOLN
3.0000 mL | Freq: Two times a day (BID) | INTRAMUSCULAR | Status: DC
  Administered 2014-04-14 – 2014-04-17 (×5): 3 mL via INTRAVENOUS

## 2014-04-12 NOTE — ED Notes (Signed)
Blood collected via PAC-vein

## 2014-04-12 NOTE — ED Notes (Addendum)
Pt c/o fever and sore throat x 2-3 days.  Pain score 2/10.  Sts fever was 101 last night.  Pt reports having chemo on 12/21.  Hx of lymphoma.  Pt has not taken anything for fever.  Pt had blood work at [facility] earlier today.

## 2014-04-12 NOTE — ED Notes (Signed)
Urinal at the bedside

## 2014-04-12 NOTE — ED Notes (Signed)
Below orders not completed by EW. 

## 2014-04-12 NOTE — Progress Notes (Signed)
[place]  [phone]) [phone] [phone]) [phone]     ID: Maejor Erven DOB: [birth year]  MR#: [id number]  KDX#:[id number]  Patient Care Team: Valaria Good. [name], MD as PCP - General (Internal Medicine) OTHER MD:  CHIEF COMPLAINT: large cell diffuse B-cell non-Hodgkin's lymphoma  CURRENT TREATMENT: dose reduced CHOP/ rituximab   HISTORY OF PRESENT ILLNESS: From the original intake note:  "Mr Wysong developed abdominal pain and distension sometime mid August 2015. He thought this was diverticular disease. Eventually he brought these concerns to Dr [name] and per patient's report and chart review a CT of the abd/pelvis was obtained which showed significant adenopathy. Biopsy 12/13/2013 at [facility] reportedly showed a large cell, B-cell lymphoma. The patient has developed "B" symptoms including unexplained fatigue, fevers and drenching sweats. He presented to [facility] 01/05/2014 with worsening shortness of breath. Workup here included a chest CT 01/05/2014 which showed diffuse upper abdominal and mediastinal adenopathy, with bilateral pleural effusions. V/Q scan was low probability for PE and bilateral LE dopples were negative for DVT. Echo 01/06/2014 showed an EF or 55-60%. He developed A-fib 01/06/2014 and has been started on cardizem. Other problems include acute renal injury, DM, HTN, dyslipidemia with atherosclerosis noted on CT scans and OSA requiring norcturnal C-pap"  The patient's subsequent history is as detailed below  INTERVAL HISTORY: Mr. Mahler returns today accompanied by his wife for follow-up of his non-Hodgkin's lymphoma. Today is day 9 cycle 5 of 8 planned cycles of treatment; he receives Neulasta on day 2  REVIEW OF SYSTEMS: He tells me he did "good" with cycle 5. He had no problems with nausea or vomiting. He did feel significantly fatigued the last couple of days. He says he is a little bit better this morning. He takes the prednisone for 5  days of course and he checks his blood sugar 4 times a day. He has a sliding scale and he uses but rarely needs to dose himself. Last night he had a temperature up to 100.7, with no other symptoms. That has cleared without any intervention. Sometimes he has a little bit of discomfort in her his upper left anterior chest (the port is on the right side) this is very intermittent. He has mild sinus problems and a little bit of a runny nose and he tells me everybody in his family is having "flu". He has a little bit of heartburn, little bit of urinary dribbling, and easy bruising. Otherwise a detailed review of systems today was stable  PAST MEDICAL HISTORY: Past Medical History  Diagnosis Date  . Hypertension   . Hyperlipemia   . Anxiety   . Lymphadenopathy, abdominal   . Urethral stricture   . Anemia   . BPPV (benign paroxysmal positional vertigo)   . Lymphoma   . Diabetes mellitus without complication   . Obstructive sleep apnea     PAST SURGICAL HISTORY: Past Surgical History  Procedure Laterality Date  . Back surgery    . Prostate surgery    . Tonsillectomy      FAMILY HISTORY No family history on file. Patient's father had a "muscle disease" and died from unclear causes age 78. Patient's mother died from heartproblems age 78. The patient had 1 brother, 5 sisters. 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 He and his wife [name] have ben married 47 years. Son Hyman lives in [place] and works  in sales. Son [name] lives in [place] and is retired (disabled). Son [name] runs a maintenance shop in [place], where he lives. Son [name] works in telephones in [place]. [name]'s wife, [name], is an Therapist, sports and worked for [employer] many years. She now works for [employer].The patient has 8 gch and 6 ggch. He is a Psychologist, forensic    ADVANCED DIRECTIVES: place. Mr Raker tells me "if they can't do anything for  me" he would not want resuscitation or life support. His wife [name] is his health care POA, with son [name] listed second.   HEALTH MAINTENANCE: History  Substance Use Topics  . Smoking status: Never Smoker   . Smokeless tobacco: Not on file  . Alcohol Use: No     Colonoscopy:  PAP:  Bone density:  Lipid panel:  Allergies  Allergen Reactions  . Rituximab Other (See Comments)    Developed wheezing & rigors when infusion rate increased to 100 mg/hr.  Tolerates rate of 50 mg/hr.  . Caffeine Other (See Comments)    Makes patient feel like he's going to die  . Morphine And Related Other (See Comments)    excitability  . Penicillins Rash    Current Outpatient Prescriptions  Medication Sig Dispense Refill  . allopurinol (ZYLOPRIM) 300 MG tablet Take 1 tablet (300 mg total) by mouth daily. 90 tablet 1  . aspirin 325 MG tablet Take 1 tablet (325 mg total) by mouth daily. 30 tablet 0  . ciprofloxacin (CIPRO) 500 MG tablet Take 1 tablet (500 mg total) by mouth 2 (two) times daily. 10 tablet 0  . clorazepate (TRANXENE) 7.5 MG tablet Take 1 tablet (7.5 mg total) by mouth 2 (two) times daily as needed for anxiety or sleep. 180 tablet 0  . clotrimazole-betamethasone (LOTRISONE) cream Apply 1 application topically 2 (two) times daily.    Marland Kitchen diltiazem (CARDIZEM CD) 120 MG 24 hr capsule Take 1 capsule (120 mg total) by mouth daily. 90 capsule 1  . feeding supplement, ENSURE COMPLETE, (ENSURE COMPLETE) LIQD Take 237 mLs by mouth daily. 20 Bottle 1  . fluconazole (DIFLUCAN) 100 MG tablet Take 1 tablet (100 mg total) by mouth daily. 30 tablet 5  . glucose blood test strip ONE TOUCH ULTRA .CHECK BLOOD SUGAR AC AND HS DAILY 480 each 3  . insulin aspart (NOVOLOG) 100 UNIT/ML injection Inject 0-20 Units into the skin 3 (three) times daily with meals. 60 mL 3  . lidocaine-prilocaine (EMLA) cream Apply 1 application topically as needed. APPLY TO PORT SITE 1 HOUR PRIOR TO THERAPY. DO NOT RUB IN. COVER WITH  SARAN WRAP. 30 g 2  . metoprolol (LOPRESSOR) 50 MG tablet Take 25 mg by mouth 2 (two) times daily.      . miconazole (MICATIN) 2 % cream Apply 1 application topically 2 (two) times daily. Apply to groin area twice daily until clear 45 g 0  . naproxen (NAPROSYN) 375 MG tablet Take 1 tablet (375 mg total) by mouth 3 (three) times daily with meals. 20 tablet 0  . nystatin (MYCOSTATIN) 100000 UNIT/ML suspension SWISH AND SWALLOW 5 MLS BY MOUTH FOUR TIMES DAILY 240 mL 3  . ondansetron (ZOFRAN) 8 MG tablet Take 1 tablet (8 mg total) by mouth every 12 (twelve) hours. 20 tablet 0  . ondansetron (ZOFRAN) 8 MG tablet Take 8 mg by mouth 2 (two) times daily as needed for nausea or vomiting.    . predniSONE (DELTASONE) 20 MG tablet Take 3 tablets (60 mg total) by mouth  daily with breakfast. 15 tablet 5  . simvastatin (ZOCOR) 20 MG tablet Take 1 tablet (20 mg total) by mouth daily. 90 tablet 3  . traMADol (ULTRAM) 50 MG tablet Take 50 mg by mouth every 6 (six) hours as needed for moderate pain.     No current facility-administered medications for this visit.    OBJECTIVE: elderly white man who appears well  Filed Vitals:   04/12/14 1051  BP: 125/59  Pulse: 70  Temp: 98.1 F (36.7 C)  Resp: 18     Body mass index is 25.45 kg/(m^2).    ECOG FS:1 - Symptomatic but completely ambulatory  Ocular: Sclerae unicteric, EOMs intact Ear-nose-throat: Oropharynx shows thrush, moderate Lymphatic: No left supraclavicular adenopathy; no axillary adenopathy Lungs no rales or rhonchi Heart regular rate and rhythm  Abd soft, nontender, positive bowel sounds, no splenomegaly noted MSK no focal spinal tenderness Neuro: non-focal, well-oriented, positive affect  LAB RESULTS:  CMP     Component Value Date/Time   NA 140 04/04/2014 0756   NA 137 01/17/2014 0021   K 3.9 04/04/2014 0756   K 3.9 01/17/2014 0021   CL 98 01/17/2014 0021   CO2 27 04/04/2014 0756   CO2 24 01/12/2014 0418   GLUCOSE 133 04/04/2014 0756    GLUCOSE 116* 01/17/2014 0021   BUN 24.1 04/04/2014 0756   BUN 22 01/17/2014 0021   CREATININE 0.9 04/04/2014 0756   CREATININE 0.90 01/17/2014 0021   CALCIUM 9.2 04/04/2014 0756   CALCIUM 7.9* 01/12/2014 0418   PROT 6.3* 04/04/2014 0756   PROT 5.3* 01/12/2014 0418   ALBUMIN 2.8* 04/04/2014 0756   ALBUMIN 2.1* 01/12/2014 0418   AST 25 04/04/2014 0756   AST 29 01/12/2014 0418   ALT 21 04/04/2014 0756   ALT 31 01/12/2014 0418   ALKPHOS 83 04/04/2014 0756   ALKPHOS 100 01/12/2014 0418   BILITOT 0.31 04/04/2014 0756   BILITOT 0.6 01/12/2014 0418   GFRNONAA 51* 01/12/2014 0418   GFRAA 59* 01/12/2014 0418    INo results found for: SPEP, UPEP  Lab Results  Component Value Date   WBC 1.6* 04/12/2014   NEUTROABS 0.6* 04/12/2014   HGB 9.2* 04/12/2014   HCT 28.8* 04/12/2014   MCV 84.2 04/12/2014   PLT 116* 04/12/2014      Chemistry      Component Value Date/Time   NA 140 04/04/2014 0756   NA 137 01/17/2014 0021   K 3.9 04/04/2014 0756   K 3.9 01/17/2014 0021   CL 98 01/17/2014 0021   CO2 27 04/04/2014 0756   CO2 24 01/12/2014 0418   BUN 24.1 04/04/2014 0756   BUN 22 01/17/2014 0021   CREATININE 0.9 04/04/2014 0756   CREATININE 0.90 01/17/2014 0021      Component Value Date/Time   CALCIUM 9.2 04/04/2014 0756   CALCIUM 7.9* 01/12/2014 0418   ALKPHOS 83 04/04/2014 0756   ALKPHOS 100 01/12/2014 0418   AST 25 04/04/2014 0756   AST 29 01/12/2014 0418   ALT 21 04/04/2014 0756   ALT 31 01/12/2014 0418   BILITOT 0.31 04/04/2014 0756   BILITOT 0.6 01/12/2014 0418       No results found for: LABCA2  No components found for: GBTDV761  No results for input(s): INR in the last 168 hours.  Urinalysis    Component Value Date/Time   COLORURINE YELLOW 01/17/2014 0029   APPEARANCEUR CLEAR 01/17/2014 0029   LABSPEC 1.019 01/17/2014 0029   PHURINE 5.5 01/17/2014 0029  GLUCOSEU NEGATIVE 01/17/2014 0029   HGBUR NEGATIVE 01/17/2014 0029   BILIRUBINUR NEGATIVE  01/17/2014 0029   KETONESUR NEGATIVE 01/17/2014 0029   PROTEINUR NEGATIVE 01/17/2014 0029   UROBILINOGEN 1.0 01/17/2014 0029   NITRITE NEGATIVE 01/17/2014 0029   LEUKOCYTESUR TRACE* 01/17/2014 0029    STUDIES: No results found.  ASSESSMENT: 80 y.o. [name] with a new diagnosis of diffuse large-cell, B-cell non-Hodgkin's lymphoma  (1) rituximab started 01/07/2014, with initial infusion reaction but able to receive entire dose that evening; to be repeated every 21 days   (2) dose-reduced CHOP/Rituxan started 01/10/2014--to be repeated every 21 days.  (a) patient was able to tolerate rituxan well starting with second infusion  (b) prednisone inadvertently left out of cycle 2, resumed cycle 3  (3) tumor lysis syndrome while hospitalized: very favorable initial reaction to treatment; following LDH and uric acid  (4) AKI while hospitalized: creatinine has normalized since   PLAN: Mr Campoy had a little temperature last night, which resolved without intervention. He is neutropenic, although not profoundly so. We are starting him on Cipro 500 mg twice daily. I have urged him to let us know of any new symptom that may develop over the next several days, and certainly if his temperature goes over 100. In that case I would start him also on Zithromax. Of course if there were any instability in his vitals we would admit him for intravenous antibiotics.  He is very compliant. He is taking his sugar 4 times a day particular really when he takes the prednisone. He rarely has to dose himself.  Otherwise she will see me again in January 11. If all is going well at that time, we will proceed to cycle #6. After that I will set him up for an echocardiogram and repeat PET scan. We will use that information to decide whether or not to proceed to cycle 7 and 8 as originally planned, or to start right toxin maintenance.  The patient has a good understanding of the overall plan. He agrees with it.  He knows the goal of treatment in his case is cure. He will call with any problems that may develop before his next visit here.  Chauncey Cruel, MD   04/12/2014 11:24 AM

## 2014-04-12 NOTE — H&P (Signed)
[facility] History and Physical  Erik Ramirez WUJ:[id number] DOB: 02/01/1926 DOA: 04/12/2014  Referring physician: Evelina Bucy, MD PCP: [name], MD   Chief Complaint: Fever  HPI: Erik Ramirez is a 80 y.o. male presents with fevers. He states that the fever started last night. He states that he reached 102F. Patient had some chills and felt cold. He had no sweating noted. He had no headaches and felt that he did have body aches. Patient states no chest pain noted. No cough or congestion. He has had some sinus drainage though. He also states that he has had a sore throat about 3 days ago. Patient states that he has no wheeze noted. He has no SOB noted. He has felt weak. He also states there is no dysuria. He has had some urgency noted. He has a diagnosis of lymphoma on chemotherapy next one is due on January 11th. ANC was noted to be fine.   Review of Systems:  Constitutional:  No weight loss, night sweats, ++Fevers, ++chills, ++fatigue.  HEENT:  No headaches, Difficulty swallowing,Tooth/dental problems,++Sore throat,  No sneezing, itching, ear ache, ++nasal congestion, ++post nasal drip,  Cardio-vascular:  No chest pain, Orthopnea, PND, swelling in lower extremities, dizziness, palpitations  GI:  No heartburn, indigestion, abdominal pain, nausea, vomiting, diarrhea, no loss of appetite  Resp:  No shortness of breath with exertion or at rest. No coughing up of blood. No wheezing  Skin:  ++rash GU:  no dysuria, change in color of urine, ++urgency. No flank pain.  Musculoskeletal:  No joint pain or swelling. No decreased range of motion. No back pain.  Psych:  No change in mood or affect. No depression or anxiety. No memory loss.   Past Medical History  Diagnosis Date  . Hypertension   . Hyperlipemia   . Anxiety   . Lymphadenopathy, abdominal   . Urethral stricture   . Anemia   . BPPV (benign paroxysmal positional vertigo)   . Lymphoma   . Diabetes mellitus  without complication   . Obstructive sleep apnea    Past Surgical History  Procedure Laterality Date  . Back surgery    . Prostate surgery    . Tonsillectomy     Social History:  reports that he has never smoked. He does not have any smokeless tobacco history on file. He reports that he does not drink alcohol or use illicit drugs.  Allergies  Allergen Reactions  . Rituximab Other (See Comments)    Developed wheezing & rigors when infusion rate increased to 100 mg/hr.  Tolerates rate of 50 mg/hr.  . Caffeine Other (See Comments)    Makes patient feel like he's going to die  . Morphine And Related Other (See Comments)    excitability  . Penicillins Rash    History reviewed. No pertinent family history.   Prior to Admission medications   Medication Sig Start Date End Date Taking? Authorizing Provider  allopurinol (ZYLOPRIM) 300 MG tablet Take 1 tablet (300 mg total) by mouth daily. 01/31/14  Yes Minette Headland, NP  aspirin 325 MG tablet Take 1 tablet (325 mg total) by mouth daily. 01/12/14  Yes Kelvin Cellar, MD  clorazepate (TRANXENE) 7.5 MG tablet Take 1 tablet (7.5 mg total) by mouth 2 (two) times daily as needed for anxiety or sleep. Patient taking differently: Take 7.5 mg by mouth every morning.  04/05/14  Yes Chauncey Cruel, MD  clotrimazole-betamethasone (LOTRISONE) cream Apply 1 application topically 2 (two) times daily.   Yes  Historical Provider, MD  feeding supplement, ENSURE COMPLETE, (ENSURE COMPLETE) LIQD Take 237 mLs by mouth daily. 01/12/14  Yes Kelvin Cellar, MD  fluconazole (DIFLUCAN) 100 MG tablet Take 1 tablet (100 mg total) by mouth daily. 02/21/14  Yes Chauncey Cruel, MD  insulin aspart (NOVOLOG) 100 UNIT/ML injection Inject 0-20 Units into the skin 3 (three) times daily with meals. 04/05/14  Yes Chauncey Cruel, MD  lidocaine-prilocaine (EMLA) cream Apply 1 application topically as needed. APPLY TO PORT SITE 1 HOUR PRIOR TO THERAPY. DO NOT RUB IN.  COVER WITH SARAN WRAP. 01/27/14  Yes Chauncey Cruel, MD  loratadine (CLARITIN) 10 MG tablet Take 10 mg by mouth every morning.   Yes Historical Provider, MD  metoprolol (LOPRESSOR) 50 MG tablet Take 25 mg by mouth 2 (two) times daily.     Yes Historical Provider, MD  miconazole (MICATIN) 2 % cream Apply 1 application topically 2 (two) times daily. Apply to groin area twice daily until clear 02/08/14  Yes Marcelino Duster, NP  nystatin (MYCOSTATIN) 100000 UNIT/ML suspension SWISH AND SWALLOW 5 MLS BY MOUTH FOUR TIMES DAILY 03/01/14  Yes Marcelino Duster, NP  polyethylene glycol (MIRALAX / GLYCOLAX) packet Take 17 g by mouth daily.   Yes Historical Provider, MD  simvastatin (ZOCOR) 20 MG tablet Take 1 tablet (20 mg total) by mouth daily. 03/21/14  Yes Marcelino Duster, NP  ciprofloxacin (CIPRO) 500 MG tablet Take 1 tablet (500 mg total) by mouth 2 (two) times daily. 01/25/14   Chauncey Cruel, MD  diltiazem (CARDIZEM CD) 120 MG 24 hr capsule Take 1 capsule (120 mg total) by mouth daily. 01/31/14   Minette Headland, NP  glucose blood test strip ONE TOUCH ULTRA .CHECK BLOOD SUGAR AC AND HS DAILY 04/05/14   Chauncey Cruel, MD  naproxen (NAPROSYN) 375 MG tablet Take 1 tablet (375 mg total) by mouth 3 (three) times daily with meals. 01/17/14   Garald Balding, NP  ondansetron (ZOFRAN) 8 MG tablet Take 1 tablet (8 mg total) by mouth every 12 (twelve) hours. Patient not taking: Reported on 04/12/2014 01/12/14   Kelvin Cellar, MD  ondansetron (ZOFRAN) 8 MG tablet Take 8 mg by mouth 2 (two) times daily as needed for nausea or vomiting.    Historical Provider, MD  predniSONE (DELTASONE) 20 MG tablet Take 3 tablets (60 mg total) by mouth daily with breakfast. Patient taking differently: Take 60 mg by mouth daily with breakfast. For 5 days after treatment 02/22/14   Chauncey Cruel, MD  traMADol (ULTRAM) 50 MG tablet Take 50 mg by mouth every 6 (six) hours as needed for moderate pain.    Historical  Provider, MD   Physical Exam: Filed Vitals:   04/12/14 1817 04/12/14 1859 04/12/14 2039  BP: 151/55  135/68  Pulse: 83  83  Temp: 98.2 F (36.8 C)    TempSrc: Oral    Resp: 16  14  Weight:  73.483 kg (162 lb)   SpO2: 100%  97%    Wt Readings from Last 3 Encounters:  04/12/14 73.483 kg (162 lb)  04/12/14 73.71 kg (162 lb 8 oz)  04/04/14 72.303 kg (159 lb 6.4 oz)    General:  Appears calm and comfortable Eyes: PERRL, normal lids, irises & conjunctiva ENT: grossly normal hearing, lips & tongue Neck: no LAD, masses or thyromegaly Cardiovascular: RRR, no m/r/g. No LE edema. Respiratory: CTA bilaterally, no w/r/r. Normal respiratory effort. Abdomen: soft, ntnd Skin: rash or  induration seen on limited exam Musculoskeletal: grossly normal tone BUE/BLE Psychiatric: grossly normal mood and affect, speech fluent and appropriate Neurologic: grossly non-focal.          Labs on Admission:  Basic Metabolic Panel:  Recent Labs Lab 04/12/14 1834  NA 136  K 3.3*  CL 102  CO2 29  GLUCOSE 144*  BUN 13  CREATININE 0.69  CALCIUM 7.8*   Liver Function Tests:  Recent Labs Lab 04/12/14 1834  AST 23  ALT 21  ALKPHOS 75  BILITOT 0.3  PROT 5.0*  ALBUMIN 2.6*   No results for input(s): LIPASE, AMYLASE in the last 168 hours. No results for input(s): AMMONIA in the last 168 hours. CBC:  Recent Labs Lab 04/12/14 1013 04/12/14 1834  WBC 1.6* 2.3*  NEUTROABS 0.6* PENDING  HGB 9.2* 6.5*  HCT 28.8* 20.0*  MCV 84.2 85.1  PLT 116* 129*   Cardiac Enzymes: No results for input(s): CKTOTAL, CKMB, CKMBINDEX, TROPONINI in the last 168 hours.  BNP (last 3 results)  Recent Labs  01/05/14 2142  PROBNP 343.7   CBG: No results for input(s): GLUCAP in the last 168 hours.  Radiological Exams on Admission: Dg Chest Port 1 View  04/12/2014   CLINICAL DATA:  Fever, sore throat.  Cough.  History of lymphoma.  EXAM: PORTABLE CHEST - 1 VIEW  COMPARISON:  PET-CT 03/09/2014.   Radiograph 01/17/2014  FINDINGS: Right chest port remains in place, tip in the mid SVC. Diminished left pleural effusion with minimal residual blunting of left costophrenic angle. No airspace consolidation to suggest pneumonia. The cardiomediastinal contours are unchanged. There is no pneumothorax.  IMPRESSION: Diminished left pleural effusion with minimal residual blunting of the costophrenic angle. No acute pulmonary process.   Electronically Signed   By: Jeb Levering M.D.   On: 04/12/2014 19:15      Assessment/Plan Active Problems:   Hypertension   Diabetes mellitus without complication   DLBCL (diffuse large B cell lymphoma)   Fever   1. Fever -possible sepsis -will culture -code sepsis called in ED -will start on cefepime and vancocin  2. Hypertension -Pressure is stable at present -Will monitor  3. Diabetes Mellitus -will continue with insulin -check A1C -monitor FSBS  4. B-Cell Lymphoma -will need to follow up with oncology -next chemo due on January 11th  5. Anemia -will type and cross for 2 units PRBCs -repeat labs  6. Hypokalemia -will replete with KCL    Code Status: Full Code (must indicate code status--if unknown or must be presumed, indicate so) DVT Prophylaxis:SCDs Family Communication: wife (indicate person spoken with, if applicable, with phone number if by telephone) Disposition Plan: Home (indicate anticipated LOS)  Time spent: 46min  KHAN,SAADAT A [facility] Pager [phone]

## 2014-04-12 NOTE — ED Provider Notes (Signed)
CSN: [id number]     Arrival date & time 04/12/14  1805 History   First MD Initiated Contact with Patient 04/12/14 1809     Chief Complaint  Patient presents with  . Chemo Card   . Fever  . Sore Throat     (Consider location/radiation/quality/duration/timing/severity/associated sxs/prior Treatment) Patient is a 80 y.o. male presenting with fever and pharyngitis. The history is provided by the patient.  Fever Max temp prior to arrival:  101 last night Temp source:  Subjective Severity:  Mild Onset quality:  Sudden Duration:  2 days Timing:  Sporadic Progression:  Unchanged Chronicity:  New Relieved by:  Nothing Worsened by:  Nothing tried Associated symptoms: sore throat   Associated symptoms: no chest pain, no cough and no vomiting   Sore Throat Pertinent negatives include no chest pain, no abdominal pain and no shortness of breath.    Past Medical History  Diagnosis Date  . Hypertension   . Hyperlipemia   . Anxiety   . Lymphadenopathy, abdominal   . Urethral stricture   . Anemia   . BPPV (benign paroxysmal positional vertigo)   . Lymphoma   . Diabetes mellitus without complication   . Obstructive sleep apnea    Past Surgical History  Procedure Laterality Date  . Back surgery    . Prostate surgery    . Tonsillectomy     History reviewed. No pertinent family history. History  Substance Use Topics  . Smoking status: Never Smoker   . Smokeless tobacco: Not on file  . Alcohol Use: No    Review of Systems  Constitutional: Negative for fever.  HENT: Positive for sore throat.   Respiratory: Negative for cough and shortness of breath.   Cardiovascular: Negative for chest pain and leg swelling.  Gastrointestinal: Negative for vomiting and abdominal pain.  All other systems reviewed and are negative.     Allergies  Rituximab; Caffeine; Morphine and related; and Penicillins  Home Medications   Prior to Admission medications   Medication Sig Start Date  End Date Taking? Authorizing Provider  allopurinol (ZYLOPRIM) 300 MG tablet Take 1 tablet (300 mg total) by mouth daily. 01/31/14   Minette Headland, NP  aspirin 325 MG tablet Take 1 tablet (325 mg total) by mouth daily. 01/12/14   Kelvin Cellar, MD  ciprofloxacin (CIPRO) 500 MG tablet Take 1 tablet (500 mg total) by mouth 2 (two) times daily. 01/25/14   Chauncey Cruel, MD  clorazepate (TRANXENE) 7.5 MG tablet Take 1 tablet (7.5 mg total) by mouth 2 (two) times daily as needed for anxiety or sleep. 04/05/14   Chauncey Cruel, MD  clotrimazole-betamethasone (LOTRISONE) cream Apply 1 application topically 2 (two) times daily.    Historical Provider, MD  diltiazem (CARDIZEM CD) 120 MG 24 hr capsule Take 1 capsule (120 mg total) by mouth daily. 01/31/14   Minette Headland, NP  feeding supplement, ENSURE COMPLETE, (ENSURE COMPLETE) LIQD Take 237 mLs by mouth daily. 01/12/14   Kelvin Cellar, MD  fluconazole (DIFLUCAN) 100 MG tablet Take 1 tablet (100 mg total) by mouth daily. 02/21/14   Chauncey Cruel, MD  glucose blood test strip ONE TOUCH ULTRA .CHECK BLOOD SUGAR AC AND HS DAILY 04/05/14   Chauncey Cruel, MD  insulin aspart (NOVOLOG) 100 UNIT/ML injection Inject 0-20 Units into the skin 3 (three) times daily with meals. 04/05/14   Chauncey Cruel, MD  lidocaine-prilocaine (EMLA) cream Apply 1 application topically as needed. APPLY TO PORT SITE  1 HOUR PRIOR TO THERAPY. DO NOT RUB IN. COVER WITH SARAN WRAP. 01/27/14   Chauncey Cruel, MD  metoprolol (LOPRESSOR) 50 MG tablet Take 25 mg by mouth 2 (two) times daily.      Historical Provider, MD  miconazole (MICATIN) 2 % cream Apply 1 application topically 2 (two) times daily. Apply to groin area twice daily until clear 02/08/14   Marcelino Duster, NP  naproxen (NAPROSYN) 375 MG tablet Take 1 tablet (375 mg total) by mouth 3 (three) times daily with meals. 01/17/14   Garald Balding, NP  nystatin (MYCOSTATIN) 100000 UNIT/ML suspension SWISH  AND SWALLOW 5 MLS BY MOUTH FOUR TIMES DAILY 03/01/14   Marcelino Duster, NP  ondansetron (ZOFRAN) 8 MG tablet Take 1 tablet (8 mg total) by mouth every 12 (twelve) hours. 01/12/14   Kelvin Cellar, MD  ondansetron (ZOFRAN) 8 MG tablet Take 8 mg by mouth 2 (two) times daily as needed for nausea or vomiting.    Historical Provider, MD  predniSONE (DELTASONE) 20 MG tablet Take 3 tablets (60 mg total) by mouth daily with breakfast. 02/22/14   Chauncey Cruel, MD  simvastatin (ZOCOR) 20 MG tablet Take 1 tablet (20 mg total) by mouth daily. 03/21/14   Marcelino Duster, NP  traMADol (ULTRAM) 50 MG tablet Take 50 mg by mouth every 6 (six) hours as needed for moderate pain.    Historical Provider, MD   BP 151/55 mmHg  Pulse 83  Temp(Src) 98.2 F (36.8 C) (Oral)  Resp 16  SpO2 100% Physical Exam  Constitutional: He is oriented to person, place, and time. He appears well-developed and well-nourished. No distress.  HENT:  Head: Normocephalic and atraumatic.  Mouth/Throat: No oropharyngeal exudate.  Eyes: EOM are normal. Pupils are equal, round, and reactive to light.  Neck: Normal range of motion. Neck supple.  Cardiovascular: Normal rate and regular rhythm.  Exam reveals no friction rub.   No murmur heard. Pulmonary/Chest: Effort normal and breath sounds normal. No respiratory distress. He has no wheezes. He has no rales.  Abdominal: He exhibits no distension. There is no tenderness. There is no rebound.  Musculoskeletal: Normal range of motion. He exhibits no edema.  Neurological: He is alert and oriented to person, place, and time.  Skin: No rash noted. He is not diaphoretic.  Nursing note and vitals reviewed.   ED Course  Procedures (including critical care time) Labs Review Labs Reviewed  CULTURE, BLOOD (ROUTINE X 2)  CULTURE, BLOOD (ROUTINE X 2)  URINE CULTURE  CBC WITH DIFFERENTIAL  COMPREHENSIVE METABOLIC PANEL  URINALYSIS, ROUTINE W REFLEX MICROSCOPIC  I-STAT CG4 LACTIC ACID,  ED    Imaging Review Dg Chest Port 1 View  04/12/2014   CLINICAL DATA:  Fever, sore throat.  Cough.  History of lymphoma.  EXAM: PORTABLE CHEST - 1 VIEW  COMPARISON:  PET-CT 03/09/2014.  Radiograph 01/17/2014  FINDINGS: Right chest port remains in place, tip in the mid SVC. Diminished left pleural effusion with minimal residual blunting of left costophrenic angle. No airspace consolidation to suggest pneumonia. The cardiomediastinal contours are unchanged. There is no pneumothorax.  IMPRESSION: Diminished left pleural effusion with minimal residual blunting of the costophrenic angle. No acute pulmonary process.   Electronically Signed   By: Jeb Levering M.D.   On: 04/12/2014 19:15     EKG Interpretation None      MDM   Final diagnoses:  Fever, unspecified fever cause  Anemia, unspecified anemia type  66M here with fever. On chemo for lymphoma. 101.7 earlier today. Mild sore throat. No cough, abdominal pain, N/V/D, dysuria, SOB. Patient's CBC drawn earlier today shows [place] of 603.  AFVSS here. Benign exam. Will treat as code sepsis, however will only hydrate with 1 L NS initially since afebrile, stable vitals, no tachycardia. Patient's Hgb 6.5 here. CXR without pneumonia. No antibiotics given.  Admitted.     Evelina Bucy, MD 04/13/14 [phone]

## 2014-04-12 NOTE — ED Notes (Signed)
Pt and family members deny having any needs at this time.

## 2014-04-13 ENCOUNTER — Telehealth: Payer: Self-pay | Admitting: Oncology

## 2014-04-13 DIAGNOSIS — R509 Fever, unspecified: Secondary | ICD-10-CM | POA: Diagnosis present

## 2014-04-13 DIAGNOSIS — D62 Acute posthemorrhagic anemia: Secondary | ICD-10-CM | POA: Diagnosis present

## 2014-04-13 DIAGNOSIS — D649 Anemia, unspecified: Secondary | ICD-10-CM

## 2014-04-13 DIAGNOSIS — R195 Other fecal abnormalities: Secondary | ICD-10-CM | POA: Diagnosis present

## 2014-04-13 DIAGNOSIS — R710 Precipitous drop in hematocrit: Secondary | ICD-10-CM | POA: Diagnosis present

## 2014-04-13 DIAGNOSIS — E883 Tumor lysis syndrome: Secondary | ICD-10-CM

## 2014-04-13 DIAGNOSIS — D709 Neutropenia, unspecified: Principal | ICD-10-CM

## 2014-04-13 LAB — URINE CULTURE
COLONY COUNT: NO GROWTH
CULTURE: NO GROWTH

## 2014-04-13 LAB — RESPIRATORY VIRUS PANEL
ADENOVIRUS: NOT DETECTED
Influenza A H1: NOT DETECTED
Influenza A H3: NOT DETECTED
Influenza A: NOT DETECTED
Influenza B: NOT DETECTED
Metapneumovirus: NOT DETECTED
Parainfluenza 1: NOT DETECTED
Parainfluenza 2: NOT DETECTED
Parainfluenza 3: NOT DETECTED
RESPIRATORY SYNCYTIAL VIRUS A: NOT DETECTED
RESPIRATORY SYNCYTIAL VIRUS B: NOT DETECTED
Rhinovirus: NOT DETECTED

## 2014-04-13 LAB — COMPREHENSIVE METABOLIC PANEL
ALT: 19 U/L (ref 0–53)
ANION GAP: 6 (ref 5–15)
AST: 23 U/L (ref 0–37)
Albumin: 2.4 g/dL — ABNORMAL LOW (ref 3.5–5.2)
Alkaline Phosphatase: 65 U/L (ref 39–117)
BILIRUBIN TOTAL: 0.7 mg/dL (ref 0.3–1.2)
BUN: 11 mg/dL (ref 6–23)
CHLORIDE: 101 meq/L (ref 96–112)
CO2: 31 mmol/L (ref 19–32)
CREATININE: 0.66 mg/dL (ref 0.50–1.35)
Calcium: 7.9 mg/dL — ABNORMAL LOW (ref 8.4–10.5)
GFR calc Af Amer: >90 mL/min (ref >90)
GFR calc non Af Amer: 85 mL/min — ABNORMAL LOW (ref >90)
Glucose, Bld: 168 mg/dL — ABNORMAL HIGH (ref 70–99)
POTASSIUM: 3.4 mmol/L — AB (ref 3.5–5.1)
SODIUM: 138 mmol/L (ref 135–145)
Total Protein: 4.9 g/dL — ABNORMAL LOW (ref 6.0–8.3)

## 2014-04-13 LAB — HEMOGLOBIN A1C
Hgb A1c MFr Bld: 6.2 % — ABNORMAL HIGH (ref <5.7)
MEAN PLASMA GLUCOSE: 131 mg/dL — AB (ref <117)

## 2014-04-13 LAB — CBC
HCT: 31.9 % — ABNORMAL LOW (ref 39.0–52.0)
Hemoglobin: 10.4 g/dL — ABNORMAL LOW (ref 13.0–17.0)
MCH: 27.9 pg (ref 26.0–34.0)
MCHC: 32.6 g/dL (ref 30.0–36.0)
MCV: 85.5 fL (ref 78.0–100.0)
Platelets: 91 10*3/uL — ABNORMAL LOW (ref 150–400)
RBC: 3.73 MIL/uL — ABNORMAL LOW (ref 4.22–5.81)
RDW: 16.5 % — ABNORMAL HIGH (ref 11.5–15.5)
WBC: 1.9 10*3/uL — ABNORMAL LOW (ref 4.0–10.5)

## 2014-04-13 LAB — TROPONIN I
Troponin I: 0.12 ng/mL — ABNORMAL HIGH (ref <0.031)
Troponin I: 0.12 ng/mL — ABNORMAL HIGH (ref <0.031)

## 2014-04-13 LAB — GLUCOSE, CAPILLARY
GLUCOSE-CAPILLARY: 151 mg/dL — AB (ref 70–99)
GLUCOSE-CAPILLARY: 140 mg/dL — AB (ref 70–99)
GLUCOSE-CAPILLARY: 161 mg/dL — AB (ref 70–99)
Glucose-Capillary: 113 mg/dL — ABNORMAL HIGH (ref 70–99)
Glucose-Capillary: 173 mg/dL — ABNORMAL HIGH (ref 70–99)

## 2014-04-13 LAB — PREPARE RBC (CROSSMATCH)

## 2014-04-13 LAB — TSH: TSH: 1.863 u[IU]/mL (ref 0.350–4.500)

## 2014-04-13 MED ORDER — DEXTROSE 5 % IV SOLN
2.0000 g | Freq: Three times a day (TID) | INTRAVENOUS | Status: DC
  Administered 2014-04-13 – 2014-04-15 (×7): 2 g via INTRAVENOUS
  Filled 2014-04-13 (×8): qty 2

## 2014-04-13 MED ORDER — POTASSIUM CHLORIDE CRYS ER 20 MEQ PO TBCR
20.0000 meq | EXTENDED_RELEASE_TABLET | Freq: Once | ORAL | Status: AC
  Administered 2014-04-13: 20 meq via ORAL
  Filled 2014-04-13 (×2): qty 1

## 2014-04-13 MED ORDER — SODIUM CHLORIDE 0.9 % IJ SOLN
10.0000 mL | INTRAMUSCULAR | Status: DC | PRN
  Administered 2014-04-13 – 2014-04-18 (×3): 10 mL
  Filled 2014-04-13 (×3): qty 40

## 2014-04-13 MED ORDER — VANCOMYCIN HCL IN DEXTROSE 1-5 GM/200ML-% IV SOLN
1000.0000 mg | Freq: Two times a day (BID) | INTRAVENOUS | Status: DC
  Filled 2014-04-13: qty 200

## 2014-04-13 MED ORDER — DEXTROSE 5 % IV SOLN
2.0000 g | INTRAVENOUS | Status: AC
  Administered 2014-04-13: 2 g via INTRAVENOUS
  Filled 2014-04-13: qty 2

## 2014-04-13 MED ORDER — MAGNESIUM SULFATE 2 GM/50ML IV SOLN
2.0000 g | Freq: Once | INTRAVENOUS | Status: AC
  Administered 2014-04-13: 2 g via INTRAVENOUS
  Filled 2014-04-13: qty 50

## 2014-04-13 MED ORDER — VANCOMYCIN HCL 10 G IV SOLR
1500.0000 mg | INTRAVENOUS | Status: AC
  Administered 2014-04-13: 1500 mg via INTRAVENOUS
  Filled 2014-04-13: qty 1500

## 2014-04-13 NOTE — Progress Notes (Signed)
PROGRESS NOTE  Erik Ramirez VOJ:[id number] DOB: [birth year] DOA: 04/12/2014 PCP: [name], MD  Brief history 80 year old male with a history of diffuse large B-cell lymphoma, diabetes mellitus,  Paroxysmal atrial fibrillation, hypertension, obstructive sleep apnea presents with 3 day history of fever, myalgias, sore throat, and shortness of breath. He went to see his oncologist, Dr. Jana Hakim, on 04/12/2014. The patient was started on ciprofloxacin empirically. However, he continued to feel worse and continued to have fevers.  Therefore, he presented to the emergency department. He was noted to have an absolute neutrophil count of 506, total WBC 2.3. Chest x-ray did not show any consolidation. Blood cultures were obtained and the patient was started on vancomycin and cefepime. Urinalysis was negative for pyuria. His hemoglobin was also noted to be 6.5, and the patient was transfused with 2 units PRBC. The patient denies any nausea, vomiting, diarrhea, abdominal pain, dysuria, hematochezia, melena, headache. He has noted some dyspnea on exertion with chest discomfort.  Assessment/Plan: Neutropenic Fever -Continue empiric vancomycin and cefepime -Follow up blood culture results -Chest x-ray negative for consolidations -Urinalysis without pyuria ABLA -Hemoglobin dropped to 9.2 to 6.5  -?lab error -I personally performed rectal-->no mass, brown stool, heme+ -consult GI -pt intermittenly takes naprosyn -transfused 2 units PRBC Hypertension  -controlled -Continue diltiazem and metoprolol tartrate Diabetes mellitus type 2 -01/08/2014 hemoglobin A1c 6.5 -CBG is mildly elevated due to recent burst of prednisone after his chemotherapy -Continue NovoLog sliding scale for now -Given the patient's age and CBGs, I will allow for more liberal control B cell lymphoma -Appreciate Dr. Jana Hakim followup -dose-reduced CHOP/Rituxan started 01/10/2014 -last chemo 04/05/14 Paroxysmal atrial  fibrillation -presently in sinus -not a candidate for [facility] due to thrombocytopenia -continue ASA Atypical chest pain -pt c/o of discomfort with exertion -EKG -cycle troponins Family Communication:   Pt at beside Disposition Plan:   Home when medically stable  Antibiotics:  vanco 04/12/14>>>  Cefepime 04/12/14>>>    Procedures/Studies: Dg Chest Port 1 View  04/12/2014   CLINICAL DATA:  Fever, sore throat.  Cough.  History of lymphoma.  EXAM: PORTABLE CHEST - 1 VIEW  COMPARISON:  PET-CT 03/09/2014.  Radiograph 01/17/2014  FINDINGS: Right chest port remains in place, tip in the mid SVC. Diminished left pleural effusion with minimal residual blunting of left costophrenic angle. No airspace consolidation to suggest pneumonia. The cardiomediastinal contours are unchanged. There is no pneumothorax.  IMPRESSION: Diminished left pleural effusion with minimal residual blunting of the costophrenic angle. No acute pulmonary process.   Electronically Signed   By: Jeb Levering M.D.   On: 04/12/2014 19:15         Subjective:  patient plan of some chest discomfort with exertion. Denies any headache, nausea, vomiting, diarrhea, abdominal pain, dysuria, hematuria, hematochezia, melena, hemoptysis,  Objective: Filed Vitals:   04/13/14 0320 04/13/14 0530 04/13/14 0540 04/13/14 0605  BP: 127/60 136/66 136/65 137/60  Pulse: 70 72 76 71  Temp: 98.7 F (37.1 C) 98.2 F (36.8 C) 98.3 F (36.8 C) 98.1 F (36.7 C)  TempSrc: Axillary Axillary Axillary Axillary  Resp: 20 19 19 20   Height:      Weight:      SpO2: 99% 99% 100% 97%    Intake/Output Summary (Last 24 hours) at 04/13/14 0815 Last data filed at 04/13/14 0558  Gross per 24 hour  Intake  992.5 ml  Output   1400 ml  Net -407.5 ml   Weight change:  Exam:  General:  Pt is alert, follows commands appropriately, not in acute distress  HEENT: No icterus, No thrush,[place]/AT  Cardiovascular: RRR, S1/S2, no rubs, no  gallops  Respiratory: CTA bilaterally, no wheezing, no crackles, no rhonchi  Abdomen: Soft/+BS, non tender, non distended, no guarding  Extremities: No edema, No lymphangitis, No petechiae, No rashes, no synovitis  Data Reviewed: Basic Metabolic Panel:  Recent Labs Lab 04/12/14 1834  NA 136  K 3.3*  CL 102  CO2 29  GLUCOSE 144*  BUN 13  CREATININE 0.69  CALCIUM 7.8*   Liver Function Tests:  Recent Labs Lab 04/12/14 1834  AST 23  ALT 21  ALKPHOS 75  BILITOT 0.3  PROT 5.0*  ALBUMIN 2.6*   No results for input(s): LIPASE, AMYLASE in the last 168 hours. No results for input(s): AMMONIA in the last 168 hours. CBC:  Recent Labs Lab 04/12/14 1013 04/12/14 1834  WBC 1.6* 2.3*  NEUTROABS 0.6* 0.5*  HGB 9.2* 6.5*  HCT 28.8* 20.0*  MCV 84.2 85.1  PLT 116* 129*   Cardiac Enzymes: No results for input(s): CKTOTAL, CKMB, CKMBINDEX, TROPONINI in the last 168 hours. BNP: Invalid input(s): POCBNP CBG:  Recent Labs Lab 04/13/14 0214 04/13/14 0741  GLUCAP 151* 113*    Recent Results (from the past 240 hour(s))  Rapid strep screen     Status: None   Collection Time: 04/12/14  9:23 PM  Result Value Ref Range Status   Streptococcus, Group A Screen (Direct) NEGATIVE NEGATIVE Final    Comment: (NOTE) A Rapid Antigen test may result negative if the antigen level in the sample is below the detection level of this test. The FDA has not cleared this test as a stand-alone test therefore the rapid antigen negative result has reflexed to a Group A Strep culture.      Scheduled Meds: . allopurinol  300 mg Oral Daily  . aspirin  325 mg Oral Daily  . ceFEPime (MAXIPIME) IV  2 g Intravenous Q8H  . clorazepate  7.5 mg Oral BH-q7a  . clotrimazole  1 application Topical BID  . diltiazem  120 mg Oral Daily  . docusate sodium  100 mg Oral BID  . feeding supplement (ENSURE COMPLETE)  237 mL Oral Q24H  . fluconazole  100 mg Oral Daily  . folic acid  1 mg Oral Daily  .  insulin aspart  0-20 Units Subcutaneous TID WC  . insulin aspart  0-9 Units Subcutaneous TID WC  . loratadine  10 mg Oral Daily  . metoprolol  25 mg Oral BID  . miconazole  1 application Topical BID  . multivitamin with minerals  1 tablet Oral Daily  . nystatin  5 mL Oral QID  . simvastatin  20 mg Oral Daily  . sodium chloride  3 mL Intravenous Q12H  . thiamine  100 mg Oral Daily  . [START ON 04/14/2014] vancomycin  1,000 mg Intravenous Q12H   Continuous Infusions: . sodium chloride 50 mL/hr at 04/13/14 0137     Stevana Dufner, DO  [facility] Pager [phone]  If 7PM-7AM, please contact night-coverage [email or web address] Password [email or web address] 04/13/2014, 8:15 AM   LOS: 1 day

## 2014-04-13 NOTE — Progress Notes (Signed)
INITIAL NUTRITION ASSESSMENT  DOCUMENTATION CODES Per approved criteria  -Severe malnutrition in the context of chronic illness  Pt meets criteria for severe MALNUTRITION in the context of chronic illness as evidenced by 14% body weight loss in 4 months, PO intake < 75% for > one month.   INTERVENTION: -Recommend Ensure Complete po q24h, each supplement provides 350 kcal and 13 grams of protein -Encouraged intake of small meals and snacks 5-6x daily -Reviewed nutrition therapy for taste changes/alterations -RD to continue to monitor   NUTRITION DIAGNOSIS: Unintentional wt loss related to inadequate energy intake/chronic illness as evidenced by 30 lb wt loss in 3 months.   Goal: Pt to meet >/= 90% of their estimated nutrition needs    Monitor:  Total protein/energy intake, labs, weights, education needs  Reason for Assessment: MST  80 y.o. male  Admitting Dx: <principal problem not specified>  ASSESSMENT: Erik Ramirez is a 80 y.o. male presents with fevers. He states that the fever started last night. He states that he reached 102F.  -Pt's wife reported ongoing decreased appetite and weight loss -Has lost ~25-30 lbs in past 3 months (17% body weight loss, severe for time frame) -Appetite varies; on good day, pt will consume two Premier protein shakes ([employer] brand), each supplement providing 30 gram protein. Will snack or eat 50% of meals in-between shakes. If pt not feeling well, will only drink one Premier shake or Boost High protein supplement -Family has been instructed by oncology to avoid fresh fruits and vegetables, use condiments in single serving packages and only keep for 24 hours d/t compromised immune system and fevers -Encouraged use of convenient snacks and supplements (peanut butter crackers, granola bars, cheese and crackers) as wife noted she was wasting a lot of leftovers d/t 24 hr rule -Pt currently eating well, 100% of meals during admit.  -Willing to  drink strawberry Ensure once daily to assist in weight maintenance  Height: Ht Readings from Last 1 Encounters:  04/13/14 5\' 7"  (1.702 m)    Weight: Wt Readings from Last 1 Encounters:  04/13/14 159 lb 6.4 oz (72.303 kg)    Ideal Body Weight: 148 lb  % Ideal Body Weight: 107%  Wt Readings from Last 10 Encounters:  04/13/14 159 lb 6.4 oz (72.303 kg)  04/12/14 162 lb 8 oz (73.71 kg)  04/04/14 159 lb 6.4 oz (72.303 kg)  03/14/14 161 lb 1.6 oz (73.074 kg)  03/01/14 165 lb 4.8 oz (74.98 kg)  02/21/14 165 lb 11.2 oz (75.161 kg)  02/08/14 168 lb 12.8 oz (76.567 kg)  01/31/14 173 lb 11.2 oz (78.79 kg)  01/19/14 184 lb 3.2 oz (83.553 kg)  01/16/14 185 lb (83.915 kg)    Usual Body Weight: 185 lb  % Usual Body Weight: 86%  BMI:  Body mass index is 24.96 kg/(m^2).  Estimated Nutritional Needs: Kcal: 2100-2300 Protein: 90-100 gram Fluid: >/= 2100 ml daily  Skin: WDL  Diet Order: Diet Carb Modified  EDUCATION NEEDS: -No education needs identified at this time   Intake/Output Summary (Last 24 hours) at 04/13/14 1644 Last data filed at 04/13/14 1414  Gross per 24 hour  Intake 1522.5 ml  Output   1750 ml  Net -227.5 ml    Last BM: 12/29   Labs:   Recent Labs Lab 04/12/14 1834 04/13/14 1105  NA 136 138  K 3.3* 3.4*  CL 102 101  CO2 29 31  BUN 13 11  CREATININE 0.69 0.66  CALCIUM 7.8* 7.9*  GLUCOSE  144* 168*    CBG (last 3)   Recent Labs  04/13/14 0214 04/13/14 0741 04/13/14 1219  GLUCAP 151* 113* 140*    Scheduled Meds: . allopurinol  300 mg Oral Daily  . aspirin  325 mg Oral Daily  . ceFEPime (MAXIPIME) IV  2 g Intravenous Q8H  . clorazepate  7.5 mg Oral BH-q7a  . clotrimazole  1 application Topical BID  . diltiazem  120 mg Oral Daily  . docusate sodium  100 mg Oral BID  . feeding supplement (ENSURE COMPLETE)  237 mL Oral Q24H  . fluconazole  100 mg Oral Daily  . folic acid  1 mg Oral Daily  . insulin aspart  0-9 Units Subcutaneous TID WC   . loratadine  10 mg Oral Daily  . metoprolol  25 mg Oral BID  . miconazole  1 application Topical BID  . multivitamin with minerals  1 tablet Oral Daily  . nystatin  5 mL Oral QID  . simvastatin  20 mg Oral Daily  . sodium chloride  3 mL Intravenous Q12H  . thiamine  100 mg Oral Daily  . [START ON 04/14/2014] vancomycin  1,000 mg Intravenous Q12H    Continuous Infusions: . sodium chloride 50 mL/hr at 04/13/14 5631    Past Medical History  Diagnosis Date  . Hypertension   . Hyperlipemia   . Anxiety   . Lymphadenopathy, abdominal   . Urethral stricture   . Anemia   . BPPV (benign paroxysmal positional vertigo)   . Lymphoma   . Diabetes mellitus without complication   . Obstructive sleep apnea     uses cpap at home  . Sleep apnea     Past Surgical History  Procedure Laterality Date  . Back surgery    . Prostate surgery    . Tonsillectomy      [place] Clinical Dietitian [phone]

## 2014-04-13 NOTE — Telephone Encounter (Signed)
cld * spoke to pt spouse [name] in re to appt time chge and adv of new time & date-pt spouse understood

## 2014-04-13 NOTE — Progress Notes (Signed)
Utilization Review completed.  Eldred Lievanos RN CM  

## 2014-04-13 NOTE — Progress Notes (Signed)
CCMD alerted me that pt had a 8 beat run of v-tach. VSS, pt unsymptomatic.  Will continue to monitor closely.

## 2014-04-13 NOTE — Progress Notes (Signed)
RT placed pt on auto titrate CPAP 5-20cmH2O via ffm. Sterile water was added for humidification. Pt states he is comfortable and is tolerating CPAP well at this time. RT will continue to monitor as needed.

## 2014-04-13 NOTE — Consult Note (Signed)
Consultation  Referring Provider: [facility]   Primary Care Physician:  [name], MD Primary Gastroenterologist: Dr. Cindee Lame ( now deceased)        Reason for Consultation:  Heme positive anemia            HPI:   Erik Ramirez is a 80 y.o. male with a history of B-cell non-Hodgkins lymphoma diagnosed in September 2015, currently undergoing chemotherapy under direction of Dr. Jana Hakim. Patient presented to ED yesterday with neutropenic fever / sore throat. He is being treated for possible sepsis. Influenza pending. Patient found to have had a drop in hgb. He is heme positive. No overt bleeding at home. Patient on chronic ASA. He took 81mg  ASA daily then increased it to 325mg  daily when chemo started a few months back. He does take Prednisone with chemo. No abdominal pain (just sore since being diagnosed with lymphoma). No nausea. No hx of PUD. Patient has lost a significant amount of weight since lymphoma diagnosis but weight stable now.     Past Medical History  Diagnosis Date  . Hypertension   . Hyperlipemia   . Anxiety   . Lymphadenopathy, abdominal   . Urethral stricture   . Anemia   . BPPV (benign paroxysmal positional vertigo)   . Lymphoma   . Diabetes mellitus without complication   . Obstructive sleep apnea     uses cpap at home  . Sleep apnea     Past Surgical History  Procedure Laterality Date  . Back surgery    . Prostate surgery    . Tonsillectomy      [place]: No colon, gastric or other known GI malignancies   History  Substance Use Topics  . Smoking status: Former Smoker -- 30 years    Quit date: 04/12/1968  . Smokeless tobacco: Never Used  . Alcohol Use: No    Prior to Admission medications   Medication Sig Start Date End Date Taking? Authorizing Provider  allopurinol (ZYLOPRIM) 300 MG tablet Take 1 tablet (300 mg total) by mouth daily. 01/31/14  Yes Minette Headland, NP  aspirin 325 MG tablet Take 1 tablet (325 mg total) by mouth daily.  01/12/14  Yes Kelvin Cellar, MD  clorazepate (TRANXENE) 7.5 MG tablet Take 1 tablet (7.5 mg total) by mouth 2 (two) times daily as needed for anxiety or sleep. Patient taking differently: Take 7.5 mg by mouth every morning.  04/05/14  Yes Chauncey Cruel, MD  clotrimazole-betamethasone (LOTRISONE) cream Apply 1 application topically 2 (two) times daily.   Yes Historical Provider, MD  feeding supplement, ENSURE COMPLETE, (ENSURE COMPLETE) LIQD Take 237 mLs by mouth daily. 01/12/14  Yes Kelvin Cellar, MD  fluconazole (DIFLUCAN) 100 MG tablet Take 1 tablet (100 mg total) by mouth daily. 02/21/14  Yes Chauncey Cruel, MD  insulin aspart (NOVOLOG) 100 UNIT/ML injection Inject 0-20 Units into the skin 3 (three) times daily with meals. 04/05/14  Yes Chauncey Cruel, MD  lidocaine-prilocaine (EMLA) cream Apply 1 application topically as needed. APPLY TO PORT SITE 1 HOUR PRIOR TO THERAPY. DO NOT RUB IN. COVER WITH SARAN WRAP. 01/27/14  Yes Chauncey Cruel, MD  loratadine (CLARITIN) 10 MG tablet Take 10 mg by mouth every morning.   Yes Historical Provider, MD  metoprolol (LOPRESSOR) 50 MG tablet Take 25 mg by mouth 2 (two) times daily.     Yes Historical Provider, MD  miconazole (MICATIN) 2 % cream Apply 1 application topically 2 (two) times daily. Apply  to groin area twice daily until clear 02/08/14  Yes Marcelino Duster, NP  nystatin (MYCOSTATIN) 100000 UNIT/ML suspension SWISH AND SWALLOW 5 MLS BY MOUTH FOUR TIMES DAILY 03/01/14  Yes Marcelino Duster, NP  polyethylene glycol (MIRALAX / GLYCOLAX) packet Take 17 g by mouth daily.   Yes Historical Provider, MD  simvastatin (ZOCOR) 20 MG tablet Take 1 tablet (20 mg total) by mouth daily. 03/21/14  Yes Marcelino Duster, NP  ciprofloxacin (CIPRO) 500 MG tablet Take 1 tablet (500 mg total) by mouth 2 (two) times daily. 01/25/14   Chauncey Cruel, MD  diltiazem (CARDIZEM CD) 120 MG 24 hr capsule Take 1 capsule (120 mg total) by mouth daily. 01/31/14    Minette Headland, NP  glucose blood test strip ONE TOUCH ULTRA .CHECK BLOOD SUGAR AC AND HS DAILY 04/05/14   Chauncey Cruel, MD  naproxen (NAPROSYN) 375 MG tablet Take 1 tablet (375 mg total) by mouth 3 (three) times daily with meals. 01/17/14   Garald Balding, NP  ondansetron (ZOFRAN) 8 MG tablet Take 1 tablet (8 mg total) by mouth every 12 (twelve) hours. Patient not taking: Reported on 04/12/2014 01/12/14   Kelvin Cellar, MD  ondansetron (ZOFRAN) 8 MG tablet Take 8 mg by mouth 2 (two) times daily as needed for nausea or vomiting.    Historical Provider, MD  predniSONE (DELTASONE) 20 MG tablet Take 3 tablets (60 mg total) by mouth daily with breakfast. Patient taking differently: Take 60 mg by mouth daily with breakfast. For 5 days after treatment 02/22/14   Chauncey Cruel, MD  traMADol (ULTRAM) 50 MG tablet Take 50 mg by mouth every 6 (six) hours as needed for moderate pain.    Historical Provider, MD    Current Facility-Administered Medications  Medication Dose Route Frequency Provider Last Rate Last Dose  . 0.9 %  sodium chloride infusion   Intravenous Continuous Allyne Gee, MD 50 mL/hr at 04/13/14 0137    . acetaminophen (TYLENOL) tablet 650 mg  650 mg Oral Q6H PRN Allyne Gee, MD       Or  . acetaminophen (TYLENOL) suppository 650 mg  650 mg Rectal Q6H PRN Allyne Gee, MD      . allopurinol (ZYLOPRIM) tablet 300 mg  300 mg Oral Daily Allyne Gee, MD      . aspirin tablet 325 mg  325 mg Oral Daily Allyne Gee, MD      . ceFEPIme (MAXIPIME) 2 g in dextrose 5 % 50 mL IVPB  2 g Intravenous Q8H Emiliano Dyer, RPH      . clorazepate (TRANXENE) tablet 7.5 mg  7.5 mg Oral BH-q7a Allyne Gee, MD   7.5 mg at 04/13/14 6712  . clotrimazole (LOTRIMIN) 1 % cream 1 application  1 application Topical BID Allyne Gee, MD      . diltiazem (CARDIZEM CD) 24 hr capsule 120 mg  120 mg Oral Daily Allyne Gee, MD      . docusate sodium (COLACE) capsule 100 mg  100 mg Oral BID Allyne Gee, MD   100 mg at 04/13/14 0205  . feeding supplement (ENSURE COMPLETE) (ENSURE COMPLETE) liquid 237 mL  237 mL Oral Q24H Allyne Gee, MD      . fluconazole (DIFLUCAN) tablet 100 mg  100 mg Oral Daily Allyne Gee, MD      . folic acid (FOLVITE) tablet 1 mg  1 mg Oral Daily  Allyne Gee, MD      . insulin aspart (novoLOG) injection 0-9 Units  0-9 Units Subcutaneous TID WC Allyne Gee, MD   0 Units at 04/13/14 0800  . lidocaine-prilocaine (EMLA) cream 1 application  1 application Topical PRN Allyne Gee, MD      . loratadine (CLARITIN) tablet 10 mg  10 mg Oral Daily Allyne Gee, MD      . metoprolol tartrate (LOPRESSOR) tablet 25 mg  25 mg Oral BID Allyne Gee, MD   25 mg at 04/13/14 0205  . miconazole (MICOTIN) 2 % cream 1 application  1 application Topical BID Allyne Gee, MD   1 application at 17/79/39 0206  . multivitamin with minerals tablet 1 tablet  1 tablet Oral Daily Allyne Gee, MD      . nystatin (MYCOSTATIN) 100000 UNIT/ML suspension 500,000 Units  5 mL Oral QID Allyne Gee, MD      . ondansetron [facility]) tablet 4 mg  4 mg Oral Q6H PRN Allyne Gee, MD       Or  . ondansetron (ZOFRAN) injection 4 mg  4 mg Intravenous Q6H PRN Allyne Gee, MD      . oxyCODONE (Oxy IR/ROXICODONE) immediate release tablet 5 mg  5 mg Oral Q4H PRN Allyne Gee, MD      . simvastatin (ZOCOR) tablet 20 mg  20 mg Oral Daily Allyne Gee, MD      . sodium chloride 0.9 % injection 3 mL  3 mL Intravenous Q12H Allyne Gee, MD   3 mL at 04/13/14 0206  . thiamine (VITAMIN B-1) tablet 100 mg  100 mg Oral Daily Allyne Gee, MD      . traMADol Veatrice Bourbon) tablet 50 mg  50 mg Oral Q6H PRN Allyne Gee, MD      . Derrill Memo ON 04/14/2014] vancomycin (VANCOCIN) IVPB 1000 mg/200 mL premix  1,000 mg Intravenous Q12H Emiliano Dyer, RPH        Allergies as of 04/12/2014 - Review Complete 04/12/2014  Allergen Reaction Noted  . Rituximab Other (See Comments) 01/08/2014  . Caffeine Other (See  Comments) 02/15/2011  . Morphine and related Other (See Comments) 02/15/2011  . Penicillins Rash 02/15/2011    Review of Systems:    All systems reviewed and negative except where noted in HPI.    Physical Exam:  Vital signs in last 24 hours: Temp:  [98.1 F (36.7 C)-99.8 F (37.7 C)] 98.2 F (36.8 C) (12/30 0830) Pulse Rate:  [70-99] 75 (12/30 0830) Resp:  [14-20] 20 (12/30 0830) BP: (125-185)/(55-79) 153/68 mmHg (12/30 0830) SpO2:  [96 %-100 %] 96 % (12/30 0830) Weight:  [159 lb 6.4 oz (72.303 kg)-162 lb 8 oz (73.71 kg)] 159 lb 6.4 oz (72.303 kg) (12/30 0131) Last BM Date: 04/12/14 General:   Pleasant white male in NAD Head:  Normocephalic and atraumatic. Eyes:   No icterus.   Conjunctiva pale. Ears:  Normal auditory acuity. Neck:  Supple; no masses felt Lungs:  Respirations even and unlabored. Lungs clear to auscultation bilaterally.   No wheezes, crackles, or rhonchi.  Heart:  Regular rate and rhythm; Abdomen:  Soft, nondistended, mild diffuse mid and lower tenderness. Normal bowel sounds. No appreciable masses or hepatomegaly.  Msk:  Symmetrical without gross deformities.  Extremities:  Without edema. Neurologic:  Alert and  oriented x4;  grossly normal neurologically. Skin:  Intact without significant lesions or rashes. Cervical Nodes:  No  significant cervical adenopathy. Psych:  Alert and cooperative. Normal affect.  LAB RESULTS:  Recent Labs  04/12/14 1013 04/12/14 1834  WBC 1.6* 2.3*  HGB 9.2* 6.5*  HCT 28.8* 20.0*  PLT 116* 129*   BMET  Recent Labs  04/12/14 1834  NA 136  K 3.3*  CL 102  CO2 29  GLUCOSE 144*  BUN 13  CREATININE 0.69  CALCIUM 7.8*   LFT  Recent Labs  04/12/14 1834  PROT 5.0*  ALBUMIN 2.6*  AST 23  ALT 21  ALKPHOS 75  BILITOT 0.3    STUDIES: Dg Chest Port 1 View  04/12/2014   CLINICAL DATA:  Fever, sore throat.  Cough.  History of lymphoma.  EXAM: PORTABLE CHEST - 1 VIEW  COMPARISON:  PET-CT 03/09/2014.  Radiograph  01/17/2014  FINDINGS: Right chest port remains in place, tip in the mid SVC. Diminished left pleural effusion with minimal residual blunting of left costophrenic angle. No airspace consolidation to suggest pneumonia. The cardiomediastinal contours are unchanged. There is no pneumothorax.  IMPRESSION: Diminished left pleural effusion with minimal residual blunting of the costophrenic angle. No acute pulmonary process.   Electronically Signed   By: Jeb Levering M.D.   On: 04/12/2014 19:15    PREVIOUS ENDOSCOPIES:            numerous colonoscopies in [facility] for colon polyps. Last colonoscopy approx 7 years ago, told to follow up in 10 years. Gastroenterologist passed away last year   Impression / Plan:   80 year old male with acute on chronic anemia. Baseline hgb mid 10, down to 9.2 in ED yesterday and 6.5 today. He is heme positive. Two units given, repeat hgb not yet available. Patient is actually pancytopenic on chemotherapy. He could certainly have some low volume GI bleeding from erosive disease or PUD related to NSAIDs / Prednisone. Patient apparently up to date on colonoscopy for polyp surveillance. It wouldn't be unreasonable to pursue upper endoscopy though suspect chemotherapy main culprit.   Thanks   LOS: 1 day   Tye Savoy  04/13/2014, 9:36 AM   [facility] Attending  I have also seen and assessed the patient and agree with the above note. I think overwhelming contribution to anemia is toxicity of chemotherapy. Lots of possible causes of heme + stool - does not seem to be having hemorrhage so would support and reserve endoscopic evaluation for need to treat something of diagnostic uncertainty increases.  Thanks - will be available if needed - please call.  Gatha Mayer, MD, [facility] [phone] (pager) 04/13/2014 4:16 PM

## 2014-04-13 NOTE — Progress Notes (Signed)
ANTIBIOTIC CONSULT NOTE - INITIAL  Pharmacy Consult for Vancomycin and Cefepime Indication: Febrile Neutropenia  Allergies  Allergen Reactions  . Rituximab Other (See Comments)    Developed wheezing & rigors when infusion rate increased to 100 mg/hr.  Tolerates rate of 50 mg/hr.  . Caffeine Other (See Comments)    Makes patient feel like he's going to die  . Morphine And Related Other (See Comments)    excitability  . Penicillins Rash    Patient Measurements: Weight: 162 lb (73.483 kg)  Vital Signs: Temp: 98.8 F (37.1 C) (12/29 2351) Temp Source: Oral (12/29 2351) BP: 185/73 mmHg (12/29 2351) Pulse Rate: 99 (12/29 2351) Intake/Output from previous day:   Intake/Output from this shift:    Labs:  Recent Labs  04/12/14 1013 04/12/14 1834  WBC 1.6* 2.3*  HGB 9.2* 6.5*  PLT 116* 129*  CREATININE  --  0.69   Estimated Creatinine Clearance: 62 mL/min (by C-G formula based on Cr of 0.69). No results for input(s): VANCOTROUGH, VANCOPEAK, VANCORANDOM, GENTTROUGH, GENTPEAK, GENTRANDOM, TOBRATROUGH, TOBRAPEAK, TOBRARND, AMIKACINPEAK, AMIKACINTROU, AMIKACIN in the last 72 hours.   Microbiology: Recent Results (from the past 720 hour(s))  Rapid strep screen     Status: None   Collection Time: 04/12/14  9:23 PM  Result Value Ref Range Status   Streptococcus, Group A Screen (Direct) NEGATIVE NEGATIVE Final    Comment: (NOTE) A Rapid Antigen test may result negative if the antigen level in the sample is below the detection level of this test. The FDA has not cleared this test as a stand-alone test therefore the rapid antigen negative result has reflexed to a Group A Strep culture.     Medical History: Past Medical History  Diagnosis Date  . Hypertension   . Hyperlipemia   . Anxiety   . Lymphadenopathy, abdominal   . Urethral stricture   . Anemia   . BPPV (benign paroxysmal positional vertigo)   . Lymphoma   . Diabetes mellitus without complication   . Obstructive  sleep apnea     uses cpap at home  . Sleep apnea     Medications:  Scheduled:  . acetaminophen  650 mg Oral Once  . allopurinol  300 mg Oral Daily  . aspirin  325 mg Oral Daily  . clorazepate  7.5 mg Oral BH-q7a  . clotrimazole  1 application Topical BID  . diltiazem  120 mg Oral Daily  . diphenhydrAMINE  25 mg Oral Once  . docusate sodium  100 mg Oral BID  . feeding supplement (ENSURE COMPLETE)  237 mL Oral Q24H  . fluconazole  100 mg Oral Daily  . folic acid  1 mg Oral Daily  . furosemide  20 mg Intravenous Once  . insulin aspart  0-20 Units Subcutaneous TID WC  . insulin aspart  0-9 Units Subcutaneous TID WC  . loratadine  10 mg Oral Daily  . metoprolol  25 mg Oral BID  . miconazole  1 application Topical BID  . multivitamin with minerals  1 tablet Oral Daily  . nystatin  5 mL Oral QID  . simvastatin  20 mg Oral Daily  . sodium chloride  3 mL Intravenous Q12H  . sodium chloride      . thiamine  100 mg Oral Daily   Infusions:  . sodium chloride    . sodium chloride    . ceFEPime (MAXIPIME) IV    . potassium chloride    . vancomycin     PRN:  acetaminophen **OR** acetaminophen, lidocaine-prilocaine, ondansetron **OR** ondansetron (ZOFRAN) IV, oxyCODONE, traMADol  Assessment: 80 yo male with diffuse large B cell lymphoma presents with febrile neutropenia. Most recent chemo on 12/21. Pharmacy is consulted to dose vancomycin and cefepime. Spoke with MD, ok to proceed with cefepime despite reported history of rash with penicillin.  12/30 >> Vancomycin >> 12/30 >> Cefepime >>  Tmax: 102 per patient WBC: 2.3 (ANC 0.5) Renal: SCr 0.69, CrCl 62 ml/min CG, 67 ml/min N  12/29 blood: sent 12/29 urine: sent 12/29 rapid strep screen: neg 12/29 group A strep cx:   Goal of Therapy:  Vancomycin trough level 15-20 mcg/ml  Cefepime dose per indication, renal function  Plan:   Vancomycin 1500mg  IV x 1, then 1g IV q12h Check trough at steady state Cefepime 2g IV q8h Follow  up renal function & cultures, clinical course  Peggyann Juba, PharmD, BCPS Pager: [phone] 04/13/2014,1:00 AM

## 2014-04-13 NOTE — Consult Note (Signed)
[place]  [phone] Fax:([phone]     ID: Erik Ramirez DOB: [birth year]  Erik#: [id number]  WUX#:[id number]  Patient Care Team: Valaria Good. [name], MD as PCP - General (Internal Medicine) OTHER MD:  CHIEF COMPLAINT: lrge cell diffuse B-cell non-Hodgkin's lymphoma  CURRENT TREATMENT: dose reduced CHOP/ rituximab   HISTORY OF PRESENT ILLNESS: From the original intake note:  "Erik Ramirez developed abdominal pain and distension sometime mid August 2015. He thought this was diverticular disease. Eventually he brought these concerns to Dr [name] and per patient's report and chart review a CT of the abd/pelvis was obtained which showed significant adenopathy. Biopsy 12/13/2013 at [facility] reportedly showed a large cell, B-cell lymphoma. The patient has developed "B" symptoms including unexplained fatigue, fevers and drenching sweats. He presented to [facility] 01/05/2014 with worsening shortness of breath. Workup here included a chest CT 01/05/2014 which showed diffuse upper abdominal and mediastinal adenopathy, with bilateral pleural effusions. V/Q scan was low probability for PE and bilateral LE dopples were negative for DVT. Echo 01/06/2014 showed an EF or 55-60%. He developed A-fib 01/06/2014 and has been started on cardizem. Other problems include acute renal injury, DM, HTN, dyslipidemia with atherosclerosis noted on CT scans and OSA requiring norcturnal C-pap"  The patient's subsequent history is as detailed below  INTERVAL HISTORY: Erik Gass was admitted yesterday with a temp >102 and ANC 0.5. I had seen him in the office earlier yesterday (04/12/2014) and he was clinically stable, ANC 0.6; he reported a temp the night before of 100.7, which resolved w/o intervention. We started cipro prophylaactically. Later that evening he developed a fever and felt "bad," felt weak just walking to the BR-- however her had no other localizing symptoms. He presented  to the ED as instructed and in addition to the [facility] labs was found to have a Hb of 6.5-- was 9.2 earlier the same day and baseline is >10.0. He is not aware of any bleeding, melena, or change in BMs. He was admitted and started on broad specttrum ABxs. He is also receiving PRBCs this AM  REVIEW OF SYSTEMS: Currently denies h/a's, visual changes, N/V. C/O "feeling bad" and weak yesterday PM but now feels "great." Denies cough, SOB, change in bowel habits; has some urinary dribbling and nocturia, unchanged from baseline. Rest of ROS noncontributory except as noted above  PAST MEDICAL HISTORY: Past Medical History  Diagnosis Date  . Hypertension   . Hyperlipemia   . Anxiety   . Lymphadenopathy, abdominal   . Urethral stricture   . Anemia   . BPPV (benign paroxysmal positional vertigo)   . Lymphoma   . Diabetes mellitus without complication   . Obstructive sleep apnea     uses cpap at home  . Sleep apnea     PAST SURGICAL HISTORY: Past Surgical History  Procedure Laterality Date  . Back surgery    . Prostate surgery    . Tonsillectomy      FAMILY HISTORY History reviewed. No pertinent family history. Patient's father had a "muscle disease" and died from unclear causes age 12. Patient's mother died from heartproblems age 30. The patient had 1 brother, 5 sisters. 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 He and his wife [name] have ben married 43 years. Son Letroy lives in [place] and works in Press photographer. Son [name] lives in [place] and is retired (disabled).  Son [name] runs a maintenance shop in [place], where he lives. Son [name] works in telephones in [place]. [name]'s wife, [name], is an Therapist, sports and worked for [employer] many years. She now works for [employer].The patient has 8 gch and 6 ggch. He is a Psychologist, forensic  ADVANCED DIRECTIVES:  Erik Aman tells me "if they can't do anything for me" he  would not want resuscitation or life support. His wife [name] is his health care POA, with son [name] listed second. He is appropriately a full code this admission  HEALTH MAINTENANCE: History  Substance Use Topics  . Smoking status: Former Smoker -- 30 years    Quit date: 04/12/1968  . Smokeless tobacco: Never Used  . Alcohol Use: No     Colonoscopy:  PSA:  Bone density:  Lipid panel:  Allergies  Allergen Reactions  . Rituximab Other (See Comments)    Developed wheezing & rigors when infusion rate increased to 100 mg/hr.  Tolerates rate of 50 mg/hr.  . Caffeine Other (See Comments)    Makes patient feel like he's going to die  . Morphine And Related Other (See Comments)    excitability  . Penicillins Rash    Current Facility-Administered Medications  Medication Dose Route Frequency Provider Last Rate Last Dose  . 0.9 %  sodium chloride infusion   Intravenous Continuous Allyne Gee, MD 50 mL/hr at 04/13/14 0137    . acetaminophen (TYLENOL) tablet 650 mg  650 mg Oral Q6H PRN Allyne Gee, MD       Or  . acetaminophen (TYLENOL) suppository 650 mg  650 mg Rectal Q6H PRN Allyne Gee, MD      . allopurinol (ZYLOPRIM) tablet 300 mg  300 mg Oral Daily Allyne Gee, MD      . aspirin tablet 325 mg  325 mg Oral Daily Allyne Gee, MD      . ceFEPIme (MAXIPIME) 2 g in dextrose 5 % 50 mL IVPB  2 g Intravenous Q8H Emiliano Dyer, RPH      . clorazepate (TRANXENE) tablet 7.5 mg  7.5 mg Oral BH-q7a Allyne Gee, MD   7.5 mg at 04/13/14 5400  . clotrimazole (LOTRIMIN) 1 % cream 1 application  1 application Topical BID Allyne Gee, MD      . diltiazem (CARDIZEM CD) 24 hr capsule 120 mg  120 mg Oral Daily Allyne Gee, MD      . docusate sodium (COLACE) capsule 100 mg  100 mg Oral BID Allyne Gee, MD   100 mg at 04/13/14 0205  . feeding supplement (ENSURE COMPLETE) (ENSURE COMPLETE) liquid 237 mL  237 mL Oral Q24H Allyne Gee, MD      . fluconazole (DIFLUCAN) tablet 100 mg  100  mg Oral Daily Allyne Gee, MD      . folic acid (FOLVITE) tablet 1 mg  1 mg Oral Daily Allyne Gee, MD      . insulin aspart (novoLOG) injection 0-20 Units  0-20 Units Subcutaneous TID WC Allyne Gee, MD      . insulin aspart (novoLOG) injection 0-9 Units  0-9 Units Subcutaneous TID WC Allyne Gee, MD      . lidocaine-prilocaine (EMLA) cream 1 application  1 application Topical PRN Allyne Gee, MD      . loratadine (CLARITIN) tablet 10 mg  10 mg Oral Daily Allyne Gee, MD      . metoprolol tartrate (LOPRESSOR)  tablet 25 mg  25 mg Oral BID Allyne Gee, MD   25 mg at 04/13/14 0205  . miconazole (MICOTIN) 2 % cream 1 application  1 application Topical BID Allyne Gee, MD   1 application at 26/71/24 0206  . multivitamin with minerals tablet 1 tablet  1 tablet Oral Daily Allyne Gee, MD      . nystatin (MYCOSTATIN) 100000 UNIT/ML suspension 500,000 Units  5 mL Oral QID Allyne Gee, MD      . ondansetron [facility]) tablet 4 mg  4 mg Oral Q6H PRN Allyne Gee, MD       Or  . ondansetron (ZOFRAN) injection 4 mg  4 mg Intravenous Q6H PRN Allyne Gee, MD      . oxyCODONE (Oxy IR/ROXICODONE) immediate release tablet 5 mg  5 mg Oral Q4H PRN Allyne Gee, MD      . simvastatin (ZOCOR) tablet 20 mg  20 mg Oral Daily Allyne Gee, MD      . sodium chloride 0.9 % injection 3 mL  3 mL Intravenous Q12H Allyne Gee, MD   3 mL at 04/13/14 0206  . thiamine (VITAMIN B-1) tablet 100 mg  100 mg Oral Daily Allyne Gee, MD      . traMADol Veatrice Bourbon) tablet 50 mg  50 mg Oral Q6H PRN Allyne Gee, MD      . Derrill Memo ON 04/14/2014] vancomycin (VANCOCIN) IVPB 1000 mg/200 mL premix  1,000 mg Intravenous Q12H Emiliano Dyer, RPH        OBJECTIVE: elderly White man examined in bed  Filed Vitals:   04/13/14 0605  BP: 137/60  Pulse: 71  Temp: 98.1 F (36.7 C)  Resp: 20     Body mass index is 24.96 kg/(m^2).    ECOG FS:1 - Symptomatic but completely ambulatory  Ocular: Sclerae unicteric, EOMs  ifull Ear-nose-throat: Oropharynx shows mild thrush Lungs no rales or rhonchi, examined anterolaterally Heart regular rate and rhythm Abd soft, positive bowel sounds Foley in place Neuro: non-focal, well-oriented, appropriate affect  LAB RESULTS:  CMP     Component Value Date/Time   NA 136 04/12/2014 1834   NA 140 04/04/2014 0756   K 3.3* 04/12/2014 1834   K 3.9 04/04/2014 0756   CL 102 04/12/2014 1834   CO2 29 04/12/2014 1834   CO2 27 04/04/2014 0756   GLUCOSE 144* 04/12/2014 1834   GLUCOSE 133 04/04/2014 0756   BUN 13 04/12/2014 1834   BUN 24.1 04/04/2014 0756   CREATININE 0.69 04/12/2014 1834   CREATININE 0.9 04/04/2014 0756   CALCIUM 7.8* 04/12/2014 1834   CALCIUM 9.2 04/04/2014 0756   PROT 5.0* 04/12/2014 1834   PROT 6.3* 04/04/2014 0756   ALBUMIN 2.6* 04/12/2014 1834   ALBUMIN 2.8* 04/04/2014 0756   AST 23 04/12/2014 1834   AST 25 04/04/2014 0756   ALT 21 04/12/2014 1834   ALT 21 04/04/2014 0756   ALKPHOS 75 04/12/2014 1834   ALKPHOS 83 04/04/2014 0756   BILITOT 0.3 04/12/2014 1834   BILITOT 0.31 04/04/2014 0756   GFRNONAA 84* 04/12/2014 1834   GFRAA >90 04/12/2014 1834    INo results found for: SPEP, UPEP  Lab Results  Component Value Date   WBC 2.3* 04/12/2014   NEUTROABS 0.5* 04/12/2014   HGB 6.5* 04/12/2014   HCT 20.0* 04/12/2014   MCV 85.1 04/12/2014   PLT 129* 04/12/2014    @LASTCHEMISTRY @  No results found for: LABCA2  No components found for: LABCA125  No results for input(s): INR in the last 168 hours.  Urinalysis    Component Value Date/Time   COLORURINE YELLOW 04/12/2014 2117   APPEARANCEUR CLEAR 04/12/2014 2117   LABSPEC 1.014 04/12/2014 2117   PHURINE 7.5 04/12/2014 2117   GLUCOSEU NEGATIVE 04/12/2014 2117   HGBUR NEGATIVE 04/12/2014 2117   BILIRUBINUR NEGATIVE 04/12/2014 2117   KETONESUR NEGATIVE 04/12/2014 2117   PROTEINUR NEGATIVE 04/12/2014 2117   UROBILINOGEN 1.0 04/12/2014 2117   NITRITE NEGATIVE 04/12/2014 2117    LEUKOCYTESUR NEGATIVE 04/12/2014 2117    STUDIES: Dg Chest Port 1 View  04/12/2014   CLINICAL DATA:  Fever, sore throat.  Cough.  History of lymphoma.  EXAM: PORTABLE CHEST - 1 VIEW  COMPARISON:  PET-CT 03/09/2014.  Radiograph 01/17/2014  FINDINGS: Right chest port remains in place, tip in the mid SVC. Diminished left pleural effusion with minimal residual blunting of left costophrenic angle. No airspace consolidation to suggest pneumonia. The cardiomediastinal contours are unchanged. There is no pneumothorax.  IMPRESSION: Diminished left pleural effusion with minimal residual blunting of the costophrenic angle. No acute pulmonary process.   Electronically Signed   By: Jeb Levering M.D.   On: 04/12/2014 19:15    ASSESSMENT/ PLAN 80 y.o. [name] with diffuse large-cell, B-cell non-Hodgkin's lymphoma diagnosed August 2015, currently day 10 cycle 5 dose-reduced CHOP chemotherapy, admitted with fever/neutropenia and a possible GIB  (1) rituximab started 01/07/2014, with initial infusion reaction but able to receive entire dose that evening; to be repeated every 21 days   (2) dose-reduced CHOP/Rituxan started 01/10/2014-- repeated every 21 days with 6-8 cycles planned (a) patient was able to tolerate rituxan well starting with second infusion (b) prednisone inadvertently left out of cycle 2, resumed cycle 3  (3) tumor lysis syndrome with first dose: very favorable initial reaction to treatment; following LDH and uric acid  (4) AKI while hospitalized: creatinine has normalized since  (5) febrile neutropenia: day 1 vancomycin/ cefepime, day 9 neulasta: ANC should start recovering next 24-72 h  (6) rapid drop in Hb: GI workup pending  (7) full code  Greatly appreciate hospitalist's excellent care of Erik Zarzycki. As fas as his NHL is concerned, he has a good prognosis and we anticipate good long-term control. Accordingly it makes sense to fully work up and treat  this elderly but very viable patient.  Will follow with you.   Chauncey Cruel, MD   04/13/2014 7:38 AM Medical Oncology and Hematology [facility] [address] [place], [place] [address] Tel. [phone]    Fax. [phone]

## 2014-04-14 ENCOUNTER — Other Ambulatory Visit: Payer: Self-pay | Admitting: Oncology

## 2014-04-14 ENCOUNTER — Telehealth: Payer: Self-pay | Admitting: Nurse Practitioner

## 2014-04-14 DIAGNOSIS — E43 Unspecified severe protein-calorie malnutrition: Secondary | ICD-10-CM | POA: Diagnosis present

## 2014-04-14 DIAGNOSIS — R710 Precipitous drop in hematocrit: Secondary | ICD-10-CM

## 2014-04-14 DIAGNOSIS — R7989 Other specified abnormal findings of blood chemistry: Secondary | ICD-10-CM

## 2014-04-14 DIAGNOSIS — R5081 Fever presenting with conditions classified elsewhere: Secondary | ICD-10-CM

## 2014-04-14 DIAGNOSIS — D709 Neutropenia, unspecified: Secondary | ICD-10-CM | POA: Diagnosis present

## 2014-04-14 DIAGNOSIS — C8290 Follicular lymphoma, unspecified, unspecified site: Secondary | ICD-10-CM

## 2014-04-14 LAB — TROPONIN I: TROPONIN I: 0.11 ng/mL — AB (ref <0.031)

## 2014-04-14 LAB — TYPE AND SCREEN
ABO/RH(D): O POS
Antibody Screen: NEGATIVE
UNIT DIVISION: 0
Unit division: 0

## 2014-04-14 LAB — DIFFERENTIAL
BASOS ABS: 0 10*3/uL (ref 0.0–0.1)
BASOS PCT: 0 % (ref 0–1)
EOS ABS: 0.1 10*3/uL (ref 0.0–0.7)
EOS PCT: 3 % (ref 0–5)
LYMPHS PCT: 36 % (ref 12–46)
Lymphs Abs: 1 10*3/uL (ref 0.7–4.0)
MONO ABS: 0.5 10*3/uL (ref 0.1–1.0)
Monocytes Relative: 17 % — ABNORMAL HIGH (ref 3–12)
NEUTROS PCT: 44 % (ref 43–77)
Neutro Abs: 1.2 10*3/uL — ABNORMAL LOW (ref 1.7–7.7)

## 2014-04-14 LAB — CBC
HEMATOCRIT: 32.7 % — AB (ref 39.0–52.0)
Hemoglobin: 10.7 g/dL — ABNORMAL LOW (ref 13.0–17.0)
MCH: 27.6 pg (ref 26.0–34.0)
MCHC: 32.7 g/dL (ref 30.0–36.0)
MCV: 84.5 fL (ref 78.0–100.0)
Platelets: 95 10*3/uL — ABNORMAL LOW (ref 150–400)
RBC: 3.87 MIL/uL — ABNORMAL LOW (ref 4.22–5.81)
RDW: 16.5 % — ABNORMAL HIGH (ref 11.5–15.5)
WBC: 2.8 10*3/uL — AB (ref 4.0–10.5)

## 2014-04-14 LAB — CULTURE, GROUP A STREP

## 2014-04-14 LAB — GLUCOSE, CAPILLARY
GLUCOSE-CAPILLARY: 146 mg/dL — AB (ref 70–99)
Glucose-Capillary: 132 mg/dL — ABNORMAL HIGH (ref 70–99)
Glucose-Capillary: 141 mg/dL — ABNORMAL HIGH (ref 70–99)
Glucose-Capillary: 149 mg/dL — ABNORMAL HIGH (ref 70–99)

## 2014-04-14 LAB — BASIC METABOLIC PANEL
ANION GAP: 4 — AB (ref 5–15)
BUN: 14 mg/dL (ref 6–23)
CALCIUM: 7.5 mg/dL — AB (ref 8.4–10.5)
CO2: 31 mmol/L (ref 19–32)
Chloride: 103 mEq/L (ref 96–112)
Creatinine, Ser: 0.61 mg/dL (ref 0.50–1.35)
GFR calc Af Amer: >90 mL/min (ref >90)
GFR calc non Af Amer: 88 mL/min — ABNORMAL LOW (ref >90)
GLUCOSE: 125 mg/dL — AB (ref 70–99)
POTASSIUM: 3.1 mmol/L — AB (ref 3.5–5.1)
Sodium: 138 mmol/L (ref 135–145)

## 2014-04-14 MED ORDER — VANCOMYCIN HCL IN DEXTROSE 1-5 GM/200ML-% IV SOLN
1000.0000 mg | Freq: Two times a day (BID) | INTRAVENOUS | Status: DC
  Administered 2014-04-14 – 2014-04-15 (×3): 1000 mg via INTRAVENOUS
  Filled 2014-04-14 (×3): qty 200

## 2014-04-14 MED ORDER — POTASSIUM CHLORIDE CRYS ER 20 MEQ PO TBCR
40.0000 meq | EXTENDED_RELEASE_TABLET | Freq: Once | ORAL | Status: AC
  Administered 2014-04-14: 40 meq via ORAL
  Filled 2014-04-14: qty 2

## 2014-04-14 NOTE — Consult Note (Signed)
CARDIOLOGY CONSULT NOTE   Patient ID: Erik Ramirez MRN: [id number], DOB/AGE: 01/11/1926   Admit date: 04/12/2014 Date of Consult: 04/14/2014   Primary Physician: [name], MD Primary Cardiologist: None  Pt. Profile  80 year old gentleman with large cell diffuse B-cell non-Hodgkin's lymphoma undergoing chemotherapy, admitted because of fever and shortness of breath  Problem List  Past Medical History  Diagnosis Date  . Hypertension   . Hyperlipemia   . Anxiety   . Lymphadenopathy, abdominal   . Urethral stricture   . Anemia   . BPPV (benign paroxysmal positional vertigo)   . Lymphoma   . Diabetes mellitus without complication   . Obstructive sleep apnea     uses cpap at home  . Sleep apnea     Past Surgical History  Procedure Laterality Date  . Back surgery    . Prostate surgery    . Tonsillectomy       Allergies  Allergies  Allergen Reactions  . Rituximab Other (See Comments)    Developed wheezing & rigors when infusion rate increased to 100 mg/hr.  Tolerates rate of 50 mg/hr.  . Caffeine Other (See Comments)    Makes patient feel like he's going to die  . Morphine And Related Other (See Comments)    excitability  . Penicillins Rash    HPI   This 80 year old gentleman, a oncology patient of Dr. Griffith Citron, was admitted on 04/12/14 because of shortness of breath sore throat myalgias and fever.  He had failed to improve on outpatient ciprofloxacin.  Chest x-ray did not show any consolidation.  He had an absolute neutrophil count of 506.  His hemoglobin on admission was 6.5 and he was transfused with 2 units of packed cells.  He has noted some dyspnea on exertion with slight left-sided chest discomfort.  The patient does not have any history of known ischemic heart disease.  He states that ever since he has been taking chemotherapy he has had some problems with left-sided chest discomfort which is relieved by belching.  Patient has a past history of  paroxysmal atrial fibrillation.  He is not a candidate for anticoagulation because of his thrombocytopenia.  He is presently in normal sinus rhythm.  Troponins on this admission have been slightly elevated but stable at 0.12, 0.12, and 0.11.  The patient denies any chest discomfort at the present time.  His electrocardiogram on this admission shows normal sinus rhythm with occasional premature atrial beats and new anterior wall T wave inversion not present on previous tracing of 01/06/14  Inpatient Medications  . allopurinol  300 mg Oral Daily  . aspirin  325 mg Oral Daily  . ceFEPime (MAXIPIME) IV  2 g Intravenous Q8H  . clorazepate  7.5 mg Oral BH-q7a  . clotrimazole  1 application Topical BID  . diltiazem  120 mg Oral Daily  . docusate sodium  100 mg Oral BID  . feeding supplement (ENSURE COMPLETE)  237 mL Oral Q24H  . fluconazole  100 mg Oral Daily  . folic acid  1 mg Oral Daily  . insulin aspart  0-9 Units Subcutaneous TID WC  . loratadine  10 mg Oral Daily  . metoprolol  25 mg Oral BID  . miconazole  1 application Topical BID  . multivitamin with minerals  1 tablet Oral Daily  . nystatin  5 mL Oral QID  . simvastatin  20 mg Oral Daily  . sodium chloride  3 mL Intravenous Q12H  . thiamine  100 mg Oral  Daily  . vancomycin  1,000 mg Intravenous Q12H    Family History History reviewed. No pertinent family history.   Social History History   Social History  . Marital Status: Married    Spouse Name: N/A    Number of Children: N/A  . Years of Education: N/A   Occupational History  . Not on file.   Social History Main Topics  . Smoking status: Former Smoker -- 30 years    Quit date: 04/12/1968  . Smokeless tobacco: Never Used  . Alcohol Use: No  . Drug Use: No  . Sexual Activity: No   Other Topics Concern  . Not on file   Social History Narrative     Review of Systems  General:  No chills, fever, night sweats or weight changes.  Cardiovascular:  No chest pain,  dyspnea on exertion, edema, orthopnea, palpitations, paroxysmal nocturnal dyspnea. Dermatological: No rash, lesions/masses Respiratory: No cough, dyspnea Urologic: No hematuria, dysuria Abdominal:   No nausea, vomiting, diarrhea, bright red blood per rectum, melena, or hematemesis Neurologic:  No visual changes, wkns, changes in mental status. All other systems reviewed and are otherwise negative except as noted above.  Physical Exam  Blood pressure 130/69, pulse 76, temperature 98.2 F (36.8 C), temperature source Oral, resp. rate 19, height 5\' 7"  (1.702 m), weight 159 lb 6.4 oz (72.303 kg), SpO2 96 %.  General: Pleasant, NAD Psych: Normal affect. Neuro: Alert and oriented X 3. Moves all extremities spontaneously. HEENT: Normal  Neck: Supple without bruits or JVD. Lungs:  Resp regular and unlabored, CTA. Heart: RRR no s3, s4, or murmurs. Abdomen: Soft, non-tender, non-distended, BS + x 4.  Extremities: No clubbing, cyanosis or edema. DP/PT/Radials 2+ and equal bilaterally.  Labs   Recent Labs  04/13/14 1105 04/13/14 1715 04/13/14 2306  TROPONINI 0.12* 0.12* 0.11*   Lab Results  Component Value Date   WBC 2.8* 04/14/2014   HGB 10.7* 04/14/2014   HCT 32.7* 04/14/2014   MCV 84.5 04/14/2014   PLT 95* 04/14/2014    Recent Labs Lab 04/13/14 1105 04/14/14 0500  NA 138 138  K 3.4* 3.1*  CL 101 103  CO2 31 31  BUN 11 14  CREATININE 0.66 0.61  CALCIUM 7.9* 7.5*  PROT 4.9*  --   BILITOT 0.7  --   ALKPHOS 65  --   ALT 19  --   AST 23  --   GLUCOSE 168* 125*   No results found for: CHOL, HDL, LDLCALC, TRIG No results found for: DDIMER  Radiology/Studies  Dg Chest Port 1 View  04/12/2014   CLINICAL DATA:  Fever, sore throat.  Cough.  History of lymphoma.  EXAM: PORTABLE CHEST - 1 VIEW  COMPARISON:  PET-CT 03/09/2014.  Radiograph 01/17/2014  FINDINGS: Right chest port remains in place, tip in the mid SVC. Diminished left pleural effusion with minimal residual  blunting of left costophrenic angle. No airspace consolidation to suggest pneumonia. The cardiomediastinal contours are unchanged. There is no pneumothorax.  IMPRESSION: Diminished left pleural effusion with minimal residual blunting of the costophrenic angle. No acute pulmonary process.   Electronically Signed   By: Jeb Levering M.D.   On: 04/12/2014 19:15    ECG  Normal sinus rhythm.  Nonspecific anterior T-wave inversion.  ASSESSMENT AND PLAN  1.  Elevated troponin most likely secondary to demand ischemia secondary to acute illness and hemoglobin of 6.5. 2.  EKG abnormalities may also be secondary to his previous severe anemia and to  his hypokalemia, both of which are being corrected. 3.  Pancytopenia secondary to non-Hodgkin's lymphoma and chemotherapy 4.  Past history of paroxysmal atrial fibrillation, holding normal sinus rhythm on current therapy  Plan: We will follow EKG over the next several days to look for serial changes.  Hopefully we will see improvement.  No indication at this time for further ischemic workup.  We can consider outpatient Lexiscan Myoview if exertional symptoms persist after correction of anemia and underlying febrile illness.   Signed, Darlin Coco, MD  04/14/2014, 11:13 AM

## 2014-04-14 NOTE — Progress Notes (Signed)
Erik Ramirez   DOB:1975-02-01   IY#:[id number]   MHW#:[id number]  Subjective: lying in bed mostly, butgot up to BR and wasn't dizzy or weak. The condom catheter is keepinghim in bed, he says, but he likes not to have to go to the BR "all the time." No h/a, N/V, cough, phleg,; denies BRBPR or melena; no D; no hematurias or hemoptysis. Wife in room   Objective: elderly White man examined in bed Filed Vitals:   04/14/14 1411  BP: 139/67  Pulse: 71  Temp: 98.1 F (36.7 C)  Resp: 18    Body mass index is 24.96 kg/(m^2).  Intake/Output Summary (Last 24 hours) at 04/14/14 1524 Last data filed at 04/14/14 0900  Gross per 24 hour  Intake 2021.67 ml  Output    700 ml  Net 1321.67 ml     Sclerae unicteric  Oropharynx shows slightly improved thrush  No peripheral adenopathy  Lungs clear -- auscultated anterolaterally  Heart regular rate and rhythm  Abdomen soft, +BS, NT  Neuro nonfocal    CBG (last 3)   Recent Labs  04/13/14 2052 04/14/14 0722 04/14/14 1219  GLUCAP 173* 132* 141*     Labs:  Lab Results  Component Value Date   WBC 2.8* 04/14/2014   HGB 10.7* 04/14/2014   HCT 32.7* 04/14/2014   MCV 84.5 04/14/2014   PLT 95* 04/14/2014   NEUTROABS 1.2* 04/14/2014    @LASTCHEMISTRY @  Urine Studies No results for input(s): UHGB, CRYS in the last 72 hours.  Invalid input(s): UACOL, UAPR, USPG, UPH, UTP, UGL, UKET, UBIL, UNIT, UROB, ULEU, UEPI, UWBC, URBC, UBAC, CAST, UCOM, BILUA  Basic Metabolic Panel:  Recent Labs Lab 04/12/14 1834 04/13/14 1105 04/14/14 0500  NA 136 138 138  K 3.3* 3.4* 3.1*  CL 102 101 103  CO2 29 31 31   GLUCOSE 144* 168* 125*  BUN 13 11 14   CREATININE 0.69 0.66 0.61  CALCIUM 7.8* 7.9* 7.5*   GFR Estimated Creatinine Clearance: 62 mL/min (by C-G formula based on Cr of 0.61). Liver Function Tests:  Recent Labs Lab 04/12/14 1834 04/13/14 1105  AST 23 23  ALT 21 19  ALKPHOS 75 65  BILITOT 0.3 0.7  PROT 5.0* 4.9*  ALBUMIN 2.6*  2.4*   No results for input(s): LIPASE, AMYLASE in the last 168 hours. No results for input(s): AMMONIA in the last 168 hours. Coagulation profile No results for input(s): INR, PROTIME in the last 168 hours.  CBC:  Recent Labs Lab 04/12/14 1013 04/12/14 1834 04/13/14 1105 04/14/14 0500  WBC 1.6* 2.3* 1.9* 2.8*  NEUTROABS 0.6* 0.5*  --  1.2*  HGB 9.2* 6.5* 10.4* 10.7*  HCT 28.8* 20.0* 31.9* 32.7*  MCV 84.2 85.1 85.5 84.5  PLT 116* 129* 91* 95*   Cardiac Enzymes:  Recent Labs Lab 04/13/14 1105 04/13/14 1715 04/13/14 2306  TROPONINI 0.12* 0.12* 0.11*   BNP: Invalid input(s): POCBNP CBG:  Recent Labs Lab 04/13/14 1219 04/13/14 1705 04/13/14 2052 04/14/14 0722 04/14/14 1219  GLUCAP 140* 161* 173* 132* 141*   D-Dimer No results for input(s): DDIMER in the last 72 hours. Hgb A1c  Recent Labs  04/13/14 1105  HGBA1C 6.2*   Lipid Profile No results for input(s): CHOL, HDL, LDLCALC, TRIG, CHOLHDL, LDLDIRECT in the last 72 hours. Thyroid function studies  Recent Labs  04/13/14 1105  TSH 1.863   Anemia work up No results for input(s): VITAMINB12, FOLATE, FERRITIN, TIBC, IRON, RETICCTPCT in the last 72 hours. Microbiology Recent Results (from  the past 240 hour(s))  Blood Culture (routine x 2)     Status: None (Preliminary result)   Collection Time: 04/12/14  6:34 PM  Result Value Ref Range Status   Specimen Description BLOOD PAC  Final   Special Requests BOTTLES DRAWN AEROBIC AND ANAEROBIC 5CC  Final   Culture   Final           BLOOD CULTURE RECEIVED NO GROWTH TO DATE CULTURE WILL BE HELD FOR 5 DAYS BEFORE ISSUING A FINAL NEGATIVE REPORT Performed at [facility]    Report Status PENDING  Incomplete  Blood Culture (routine x 2)     Status: None (Preliminary result)   Collection Time: 04/12/14  7:24 PM  Result Value Ref Range Status   Specimen Description BLOOD RIGHT ANTECUBITAL  Final   Special Requests BOTTLES DRAWN AEROBIC AND ANAEROBIC 5CC   Final   Culture   Final           BLOOD CULTURE RECEIVED NO GROWTH TO DATE CULTURE WILL BE HELD FOR 5 DAYS BEFORE ISSUING A FINAL NEGATIVE REPORT Performed at [facility]    Report Status PENDING  Incomplete  Urine culture     Status: None   Collection Time: 04/12/14  9:17 PM  Result Value Ref Range Status   Specimen Description URINE, CLEAN CATCH  Final   Special Requests NONE  Final   Colony Count NO GROWTH Performed at [facility]   Final   Culture NO GROWTH Performed at [facility]   Final   Report Status 04/13/2014 FINAL  Final  Rapid strep screen     Status: None   Collection Time: 04/12/14  9:23 PM  Result Value Ref Range Status   Streptococcus, Group A Screen (Direct) NEGATIVE NEGATIVE Final    Comment: (NOTE) A Rapid Antigen test may result negative if the antigen level in the sample is below the detection level of this test. The FDA has not cleared this test as a stand-alone test therefore the rapid antigen negative result has reflexed to a Group A Strep culture.   Culture, Group A Strep     Status: None   Collection Time: 04/12/14  9:23 PM  Result Value Ref Range Status   Specimen Description THROAT  Final   Special Requests NONE  Final   Culture   Final    No Beta Hemolytic Streptococci Isolated Performed at [facility]    Report Status 04/14/2014 FINAL  Final  Respiratory virus panel (routine influenza)     Status: None   Collection Time: 04/13/14  2:14 AM  Result Value Ref Range Status   Source - RVPAN NASAL WASHINGS  Corrected    Comment: CORRECTED ON 12/30 AT 1834: PREVIOUSLY REPORTED AS NASAL WASHINGS   Respiratory Syncytial Virus A NOT DETECTED  Final   Respiratory Syncytial Virus B NOT DETECTED  Final   Influenza A NOT DETECTED  Final   Influenza B NOT DETECTED  Final   Parainfluenza 1 NOT DETECTED  Final   Parainfluenza 2 NOT DETECTED  Final   Parainfluenza 3 NOT DETECTED  Final   Metapneumovirus NOT DETECTED   Final   Rhinovirus NOT DETECTED  Final   Adenovirus NOT DETECTED  Final   Influenza A H1 NOT DETECTED  Final   Influenza A H3 NOT DETECTED  Final    Comment: (NOTE)       Normal Reference Range for each Analyte: NOT DETECTED Testing performed using the  Luminex xTAG Respiratory Viral Panel test kit. The analytical performance characteristics of this assay have been determined by [facility].  The modifications have not been cleared or approved by the FDA. This assay has been validated pursuant to the CLIA regulations and is used for clinical purposes. Performed at [facility]       Studies:  Dg Chest Port 1 View  04/12/2014   CLINICAL DATA:  Fever, sore throat.  Cough.  History of lymphoma.  EXAM: PORTABLE CHEST - 1 VIEW  COMPARISON:  PET-CT 03/09/2014.  Radiograph 01/17/2014  FINDINGS: Right chest port remains in place, tip in the mid SVC. Diminished left pleural effusion with minimal residual blunting of left costophrenic angle. No airspace consolidation to suggest pneumonia. The cardiomediastinal contours are unchanged. There is no pneumothorax.  IMPRESSION: Diminished left pleural effusion with minimal residual blunting of the costophrenic angle. No acute pulmonary process.   Electronically Signed   By: Jeb Levering M.D.   On: 04/12/2014 19:15    Assessment: 80 y.o. [name] with diffuse large-cell, B-cell non-Hodgkin's lymphoma diagnosed August 2015, currently day 10 cycle 5 dose-reduced CHOP chemotherapy, admitted with fever/neutropenia and a significant drop in his Hb  (1) rituximab started 01/07/2014, with initial infusion reaction but able to receive entire dose that evening; to be repeated every 21 days   (2) dose-reduced CHOP/Rituxan started 01/10/2014-- repeated every 21 days with 6-8 cycles planned (a) patient was able to tolerate rituxan well starting with second infusion (b) prednisone inadvertently left out of cycle  2, resumed cycle 3  (3) tumor lysis syndrome with first dose: very favorable initial reaction to treatment; following LDH and uric acid  (4) AKI while hospitalized: creatinine has normalized since  (5) febrile neutropenia: day 1 vancomycin/ cefepime, day 9 neulasta: ANC should start recovering next 24-72 h  (6) rapid drop in Hb: see GI note  (7) full code  Plan:  Remains AF, VSS, and is "holding on" to his transfused PRBC's, with   Ref. Range 04/12/2014 18:34 04/13/2014 11:05 04/14/2014 05:00  Hemoglobin Latest Range: 13.0-17.0 g/dL 6.5 (LL) 10.4 (L) 10.7 (L)   And cultured negative to date. [place] is recovering: Results for ELEANOR, DIMICHELE (MRN [id number]) as of 04/14/2014 15:25  Ref. Range 04/12/2014 10:13 04/12/2014 18:34 04/14/2014 05:00  NEUT# Latest Range: 1.7-7.7 K/uL 0.6 (L) 0.5 (L) 1.2 (L)   If his Hb remains stable by Sat and he is stable with recovered WBC and negative cultures, could be discharged then. I will arrange for lab check at the [facility] Monday to make sure Hb remains stable. meanwhile would mobilize as tolerated.   Again, greatly appreciate your help to Mr Hasting! I will be unavailable until 04/18/2014. Please contact my partners as needed.  Chauncey Cruel, MD 04/14/2014  3:24 PM Medical Oncology and Hematology [facility] [address] [place], [place] [address] Tel. [phone]    Fax. [phone]

## 2014-04-14 NOTE — Progress Notes (Signed)
Patient placed on CPAP. Setting is auto (min 5 and max 20). Sterile water added to water chamber for humidification. Patient is tolerating well. RT will continue to assess and monitor as needed,

## 2014-04-14 NOTE — Progress Notes (Addendum)
PROGRESS NOTE  Erik Ramirez TDH:[id number] DOB: [birth year] DOA: 04/12/2014 PCP: [name], MD  Brief history 80 year old male with a history of diffuse large B-cell lymphoma, diabetes mellitus, Paroxysmal atrial fibrillation, hypertension, obstructive sleep apnea presents with 3 day history of fever, myalgias, sore throat, and shortness of breath. He went to see his oncologist, Dr. Jana Hakim, on 04/12/2014. The patient was started on ciprofloxacin empirically. However, he continued to feel worse and continued to have fevers. Therefore, he presented to the emergency department. He was noted to have an absolute neutrophil count of 506, total WBC 2.3. Chest x-ray did not show any consolidation. Blood cultures were obtained and the patient was started on vancomycin and cefepime. Urinalysis was negative for pyuria. His hemoglobin was also noted to be 6.5, and the patient was transfused with 2 units PRBC. The patient denies any nausea, vomiting, diarrhea, abdominal pain, dysuria, hematochezia, melena, headache. He has noted some dyspnea on exertion with chest discomfort.  Assessment/Plan: Neutropenic Fever -Continue empiric vancomycin and cefepime -Follow up blood culture results -Chest x-ray negative for consolidations -Urinalysis without pyuria -ANC improving from 506>>>1232 -Plan to discontinue vancomycin and cefepime in the next 24 hours if cultures remain negative and the patient is clinically stable -respiratory viral panel neg Chest discomfort/elevated troponin -Elevated troponin likely due to demand ischemia -However, I consulted cardiology as pt has new T-wave inversions V3-V5 and c/o dyspnea on exertion with exertional chest discomfort for past 2 months ABLA -Hemoglobin dropped to 9.2 to 6.5  -Hemoglobin improved to 10.7 after 2 units PRBC--> remained stable after 24 hours -I personally performed rectal-->no mass, brown stool, heme+ -Appreciate GI consult--> defer  endoscopy at this time unless patient continues to drop hemoglobin -pt intermittenly takes naprosyn -Daily CBC Hypertension  -controlled -Continue diltiazem and metoprolol tartrate Diabetes mellitus type 2 -04/13/2014 hemoglobin A1c 6.2 -CBG is mildly elevated due to recent burst of prednisone after his chemotherapy -Continue NovoLog sliding scale for now -Given the patient's age and CBGs, I will allow for more liberal control B cell lymphoma -Appreciate Dr. Jana Hakim followup -dose-reduced CHOP/Rituxan started 01/10/2014 -last chemo 04/05/14 Paroxysmal atrial fibrillation -presently in sinus -not a candidate for [facility] due to thrombocytopenia -continue ASA Hypokalemia -Replete -Check magnesium Family Communication: Pt at beside Disposition Plan: Home when medically stable  Antibiotics:  vanco 04/12/14>>>  Cefepime 04/12/14>>>   Family Communication:   Pt at beside Disposition Plan:   Home when medically stable       Procedures/Studies: Dg Chest Port 1 View  04/12/2014   CLINICAL DATA:  Fever, sore throat.  Cough.  History of lymphoma.  EXAM: PORTABLE CHEST - 1 VIEW  COMPARISON:  PET-CT 03/09/2014.  Radiograph 01/17/2014  FINDINGS: Right chest port remains in place, tip in the mid SVC. Diminished left pleural effusion with minimal residual blunting of left costophrenic angle. No airspace consolidation to suggest pneumonia. The cardiomediastinal contours are unchanged. There is no pneumothorax.  IMPRESSION: Diminished left pleural effusion with minimal residual blunting of the costophrenic angle. No acute pulmonary process.   Electronically Signed   By: Jeb Levering M.D.   On: 04/12/2014 19:15         Subjective: Patient denies any fevers, chills, chest pain, shortness breath, nausea, vomiting, diarrhea. No abdominal pain. No hematochezia or melena.  Objective: Filed Vitals:   04/13/14 1227 04/13/14 1413 04/13/14 2053 04/14/14 0450  BP: 143/74 125/68 153/71  130/69  Pulse: 77 70 76 76  Temp: 98.1  F (36.7 C) 97.5 F (36.4 C) 99.1 F (37.3 C) 98.2 F (36.8 C)  TempSrc: Oral Oral Oral Oral  Resp: 20 18 18 19   Height:      Weight:      SpO2: 96% 98% 96% 96%    Intake/Output Summary (Last 24 hours) at 04/14/14 1016 Last data filed at 04/14/14 0900  Gross per 24 hour  Intake 2261.67 ml  Output   1050 ml  Net 1211.67 ml   Weight change:  Exam:   General:  Pt is alert, follows commands appropriately, not in acute distress  HEENT: No icterus, No thrush,[place]/AT  Cardiovascular: RRR, S1/S2, no rubs, no gallops  Respiratory: CTA bilaterally, no wheezing, no crackles, no rhonchi  Abdomen: Soft/+BS, non tender, non distended, no guarding  Extremities: trace LE edema, No lymphangitis, No petechiae, No rashes, no synovitis  Data Reviewed: Basic Metabolic Panel:  Recent Labs Lab 04/12/14 1834 04/13/14 1105 04/14/14 0500  NA 136 138 138  K 3.3* 3.4* 3.1*  CL 102 101 103  CO2 29 31 31   GLUCOSE 144* 168* 125*  BUN 13 11 14   CREATININE 0.69 0.66 0.61  CALCIUM 7.8* 7.9* 7.5*   Liver Function Tests:  Recent Labs Lab 04/12/14 1834 04/13/14 1105  AST 23 23  ALT 21 19  ALKPHOS 75 65  BILITOT 0.3 0.7  PROT 5.0* 4.9*  ALBUMIN 2.6* 2.4*   No results for input(s): LIPASE, AMYLASE in the last 168 hours. No results for input(s): AMMONIA in the last 168 hours. CBC:  Recent Labs Lab 04/12/14 1013 04/12/14 1834 04/13/14 1105 04/14/14 0500  WBC 1.6* 2.3* 1.9* 2.8*  NEUTROABS 0.6* 0.5*  --  1.2*  HGB 9.2* 6.5* 10.4* 10.7*  HCT 28.8* 20.0* 31.9* 32.7*  MCV 84.2 85.1 85.5 84.5  PLT 116* 129* 91* 95*   Cardiac Enzymes:  Recent Labs Lab 04/13/14 1105 04/13/14 1715 04/13/14 2306  TROPONINI 0.12* 0.12* 0.11*   BNP: Invalid input(s): POCBNP CBG:  Recent Labs Lab 04/13/14 0741 04/13/14 1219 04/13/14 1705 04/13/14 2052 04/14/14 0722  GLUCAP 113* 140* 161* 173* 132*    Recent Results (from the past 240 hour(s))    Blood Culture (routine x 2)     Status: None (Preliminary result)   Collection Time: 04/12/14  6:34 PM  Result Value Ref Range Status   Specimen Description BLOOD PAC  Final   Special Requests BOTTLES DRAWN AEROBIC AND ANAEROBIC 5CC  Final   Culture   Final           BLOOD CULTURE RECEIVED NO GROWTH TO DATE CULTURE WILL BE HELD FOR 5 DAYS BEFORE ISSUING A FINAL NEGATIVE REPORT Performed at [facility]    Report Status PENDING  Incomplete  Blood Culture (routine x 2)     Status: None (Preliminary result)   Collection Time: 04/12/14  7:24 PM  Result Value Ref Range Status   Specimen Description BLOOD RIGHT ANTECUBITAL  Final   Special Requests BOTTLES DRAWN AEROBIC AND ANAEROBIC 5CC  Final   Culture   Final           BLOOD CULTURE RECEIVED NO GROWTH TO DATE CULTURE WILL BE HELD FOR 5 DAYS BEFORE ISSUING A FINAL NEGATIVE REPORT Performed at [facility]    Report Status PENDING  Incomplete  Urine culture     Status: None   Collection Time: 04/12/14  9:17 PM  Result Value Ref Range Status   Specimen Description URINE, [place]  Final  Special Requests NONE  Final   Colony Count NO GROWTH Performed at [facility]   Final   Culture NO GROWTH Performed at [facility]   Final   Report Status 04/13/2014 FINAL  Final  Rapid strep screen     Status: None   Collection Time: 04/12/14  9:23 PM  Result Value Ref Range Status   Streptococcus, Group A Screen (Direct) NEGATIVE NEGATIVE Final    Comment: (NOTE) A Rapid Antigen test may result negative if the antigen level in the sample is below the detection level of this test. The FDA has not cleared this test as a stand-alone test therefore the rapid antigen negative result has reflexed to a Group A Strep culture.   Culture, Group A Strep     Status: None (Preliminary result)   Collection Time: 04/12/14  9:23 PM  Result Value Ref Range Status   Specimen Description THROAT  Final   Special Requests  NONE  Final   Culture   Final    NO SUSPICIOUS COLONIES, CONTINUING TO HOLD Performed at [facility]    Report Status PENDING  Incomplete  Respiratory virus panel (routine influenza)     Status: None   Collection Time: 04/13/14  2:14 AM  Result Value Ref Range Status   Source - RVPAN NASAL WASHINGS  Corrected    Comment: CORRECTED ON 12/30 AT 1834: PREVIOUSLY REPORTED AS NASAL WASHINGS   Respiratory Syncytial Virus A NOT DETECTED  Final   Respiratory Syncytial Virus B NOT DETECTED  Final   Influenza A NOT DETECTED  Final   Influenza B NOT DETECTED  Final   Parainfluenza 1 NOT DETECTED  Final   Parainfluenza 2 NOT DETECTED  Final   Parainfluenza 3 NOT DETECTED  Final   Metapneumovirus NOT DETECTED  Final   Rhinovirus NOT DETECTED  Final   Adenovirus NOT DETECTED  Final   Influenza A H1 NOT DETECTED  Final   Influenza A H3 NOT DETECTED  Final    Comment: (NOTE)       Normal Reference Range for each Analyte: NOT DETECTED Testing performed using the Luminex xTAG Respiratory Viral Panel test kit. The analytical performance characteristics of this assay have been determined by [facility].  The modifications have not been cleared or approved by the FDA. This assay has been validated pursuant to the CLIA regulations and is used for clinical purposes. Performed at [facility]      Scheduled Meds: . allopurinol  300 mg Oral Daily  . aspirin  325 mg Oral Daily  . ceFEPime (MAXIPIME) IV  2 g Intravenous Q8H  . clorazepate  7.5 mg Oral BH-q7a  . clotrimazole  1 application Topical BID  . diltiazem  120 mg Oral Daily  . docusate sodium  100 mg Oral BID  . feeding supplement (ENSURE COMPLETE)  237 mL Oral Q24H  . fluconazole  100 mg Oral Daily  . folic acid  1 mg Oral Daily  . insulin aspart  0-9 Units Subcutaneous TID WC  . loratadine  10 mg Oral Daily  . metoprolol  25 mg Oral BID  . miconazole  1 application Topical BID  . multivitamin with minerals   1 tablet Oral Daily  . nystatin  5 mL Oral QID  . simvastatin  20 mg Oral Daily  . sodium chloride  3 mL Intravenous Q12H  . thiamine  100 mg Oral Daily  . vancomycin  1,000 mg Intravenous  Q12H   Continuous Infusions: . sodium chloride 50 mL/hr at 04/13/14 2125     Jerrian Mells, DO  [facility] Pager [phone]  If 7PM-7AM, please contact night-coverage [email or web address] Password [email or web address] 04/14/2014, 10:16 AM   LOS: 2 days

## 2014-04-14 NOTE — Progress Notes (Addendum)
Spoke with pt regarding cpap.  Pt stated he will place it on himself when ready for bed and feels comfortable doing so.  Cpap setting is on autotitration mode min 5cm - max 20cm h2o.  Pt has hospital-provided cpap unit with his full face mask and tubing from home.  Sterile water added to humidity chamber.  Pt was advised that RT is available all night should he need further assistance.

## 2014-04-14 NOTE — Telephone Encounter (Signed)
, °

## 2014-04-15 ENCOUNTER — Other Ambulatory Visit: Payer: Self-pay

## 2014-04-15 DIAGNOSIS — R9431 Abnormal electrocardiogram [ECG] [EKG]: Secondary | ICD-10-CM

## 2014-04-15 LAB — GLUCOSE, CAPILLARY
GLUCOSE-CAPILLARY: 149 mg/dL — AB (ref 70–99)
Glucose-Capillary: 106 mg/dL — ABNORMAL HIGH (ref 70–99)
Glucose-Capillary: 135 mg/dL — ABNORMAL HIGH (ref 70–99)
Glucose-Capillary: 125 mg/dL — ABNORMAL HIGH (ref 70–99)

## 2014-04-15 LAB — BASIC METABOLIC PANEL
Anion gap: 4 — ABNORMAL LOW (ref 5–15)
BUN: 13 mg/dL (ref 6–23)
CO2: 28 mmol/L (ref 19–32)
Calcium: 7.8 mg/dL — ABNORMAL LOW (ref 8.4–10.5)
Chloride: 100 mEq/L (ref 96–112)
Creatinine, Ser: 0.68 mg/dL (ref 0.50–1.35)
GFR calc Af Amer: >90 mL/min (ref >90)
GFR, EST NON AFRICAN AMERICAN: 84 mL/min — AB (ref >90)
GLUCOSE: 114 mg/dL — AB (ref 70–99)
POTASSIUM: 3.8 mmol/L (ref 3.5–5.1)
SODIUM: 132 mmol/L — AB (ref 135–145)

## 2014-04-15 LAB — CBC WITH DIFFERENTIAL/PLATELET
BASOS PCT: 1 % (ref 0–1)
Basophils Absolute: 0 10*3/uL (ref 0.0–0.1)
Eosinophils Absolute: 0.1 10*3/uL (ref 0.0–0.7)
Eosinophils Relative: 3 % (ref 0–5)
HCT: 35.4 % — ABNORMAL LOW (ref 39.0–52.0)
Hemoglobin: 11.4 g/dL — ABNORMAL LOW (ref 13.0–17.0)
Lymphocytes Relative: 29 % (ref 12–46)
Lymphs Abs: 1.3 10*3/uL (ref 0.7–4.0)
MCH: 27.3 pg (ref 26.0–34.0)
MCHC: 32.2 g/dL (ref 30.0–36.0)
MCV: 84.7 fL (ref 78.0–100.0)
MONO ABS: 0.7 10*3/uL (ref 0.1–1.0)
MONOS PCT: 17 % — AB (ref 3–12)
Neutro Abs: 2.3 10*3/uL (ref 1.7–7.7)
Neutrophils Relative %: 50 % (ref 43–77)
Platelets: 120 10*3/uL — ABNORMAL LOW (ref 150–400)
RBC: 4.18 MIL/uL — AB (ref 4.22–5.81)
RDW: 16.6 % — ABNORMAL HIGH (ref 11.5–15.5)
WBC: 4.4 10*3/uL (ref 4.0–10.5)

## 2014-04-15 LAB — RETICULOCYTES
RBC.: 4.18 MIL/uL — AB (ref 4.22–5.81)
RETIC COUNT ABSOLUTE: 117 10*3/uL (ref 19.0–186.0)
Retic Ct Pct: 2.8 % (ref 0.4–3.1)

## 2014-04-15 LAB — MAGNESIUM: Magnesium: 1.8 mg/dL (ref 1.5–2.5)

## 2014-04-15 MED ORDER — LEVOFLOXACIN 500 MG PO TABS
500.0000 mg | ORAL_TABLET | Freq: Every day | ORAL | Status: DC
  Administered 2014-04-16 – 2014-04-18 (×3): 500 mg via ORAL
  Filled 2014-04-15 (×3): qty 1

## 2014-04-15 MED ORDER — PSEUDOEPHEDRINE HCL ER 120 MG PO TB12
120.0000 mg | ORAL_TABLET | Freq: Two times a day (BID) | ORAL | Status: DC
  Administered 2014-04-15 – 2014-04-18 (×6): 120 mg via ORAL
  Filled 2014-04-15 (×7): qty 1

## 2014-04-15 MED ORDER — POLYETHYLENE GLYCOL 3350 17 G PO PACK
17.0000 g | PACK | Freq: Every day | ORAL | Status: DC
  Administered 2014-04-16 – 2014-04-18 (×3): 17 g via ORAL
  Filled 2014-04-15 (×4): qty 1

## 2014-04-15 NOTE — Progress Notes (Signed)
Patient had a 7 beat run of V tach. Patient asleep. NP made aware.

## 2014-04-15 NOTE — Progress Notes (Signed)
Patient Name: Erik Ramirez Date of Encounter: 04/15/2014     Active Problems:   Hypertension   Diabetes mellitus without complication   DLBCL (diffuse large B cell lymphoma)   Fever   Acute blood loss anemia   Pyrexia   Heme + stool   Decreased hemoglobin   Protein-calorie malnutrition, severe   Febrile neutropenia    SUBJECTIVE  No chest pain or dyspnea. Looking forward to walking in hall today.  CURRENT MEDS . allopurinol  300 mg Oral Daily  . aspirin  325 mg Oral Daily  . ceFEPime (MAXIPIME) IV  2 g Intravenous Q8H  . clorazepate  7.5 mg Oral BH-q7a  . clotrimazole  1 application Topical BID  . diltiazem  120 mg Oral Daily  . docusate sodium  100 mg Oral BID  . feeding supplement (ENSURE COMPLETE)  237 mL Oral Q24H  . fluconazole  100 mg Oral Daily  . folic acid  1 mg Oral Daily  . insulin aspart  0-9 Units Subcutaneous TID WC  . loratadine  10 mg Oral Daily  . metoprolol  25 mg Oral BID  . miconazole  1 application Topical BID  . multivitamin with minerals  1 tablet Oral Daily  . nystatin  5 mL Oral QID  . simvastatin  20 mg Oral Daily  . sodium chloride  3 mL Intravenous Q12H  . thiamine  100 mg Oral Daily  . vancomycin  1,000 mg Intravenous Q12H    OBJECTIVE  Filed Vitals:   04/14/14 0450 04/14/14 1411 04/14/14 2100 04/15/14 0539  BP: 130/69 139/67 143/60 159/73  Pulse: 76 71 72 72  Temp: 98.2 F (36.8 C) 98.1 F (36.7 C) 98.2 F (36.8 C) 98.1 F (36.7 C)  TempSrc: Oral Oral Oral Oral  Resp: 19 18 18 18   Height:      Weight:      SpO2: 96% 95% 95% 96%    Intake/Output Summary (Last 24 hours) at 04/15/14 0913 Last data filed at 04/15/14 0600  Gross per 24 hour  Intake   2180 ml  Output   4550 ml  Net  -2370 ml   Filed Weights   04/12/14 1859 04/13/14 0131  Weight: 162 lb (73.483 kg) 159 lb 6.4 oz (72.303 kg)    PHYSICAL EXAM  General: Pleasant, NAD. Neuro: Alert and oriented X 3. Moves all extremities spontaneously. Psych: Normal  affect. HEENT:  Normal  Neck: Supple without bruits or JVD. Lungs:  Resp regular and unlabored, CTA. Heart: RRR no s3, s4, or murmurs. Abdomen: Soft, non-tender, non-distended, BS + x 4.  Extremities: No clubbing, cyanosis or edema. DP/PT/Radials 2+ and equal bilaterally.  Accessory Clinical Findings  CBC  Recent Labs  04/14/14 0500 04/15/14 0545  WBC 2.8* 4.4  NEUTROABS 1.2* 2.3  HGB 10.7* 11.4*  HCT 32.7* 35.4*  MCV 84.5 84.7  PLT 95* 810*   Basic Metabolic Panel  Recent Labs  04/13/14 1105 04/14/14 0500  NA 138 138  K 3.4* 3.1*  CL 101 103  CO2 31 31  GLUCOSE 168* 125*  BUN 11 14  CREATININE 0.66 0.61  CALCIUM 7.9* 7.5*   Liver Function Tests  Recent Labs  04/12/14 1834 04/13/14 1105  AST 23 23  ALT 21 19  ALKPHOS 75 65  BILITOT 0.3 0.7  PROT 5.0* 4.9*  ALBUMIN 2.6* 2.4*   No results for input(s): LIPASE, AMYLASE in the last 72 hours. Cardiac Enzymes  Recent Labs  04/13/14 1105 04/13/14  1715 04/13/14 2306  TROPONINI 0.12* 0.12* 0.11*   BNP Invalid input(s): POCBNP D-Dimer No results for input(s): DDIMER in the last 72 hours. Hemoglobin A1C  Recent Labs  04/13/14 1105  HGBA1C 6.2*   Fasting Lipid Panel No results for input(s): CHOL, HDL, LDLCALC, TRIG, CHOLHDL, LDLDIRECT in the last 72 hours. Thyroid Function Tests  Recent Labs  04/13/14 1105  TSH 1.863    TELE  NSR. One run of 7 idioventricular beats at 0652 this am.  ECG  pending  Radiology/Studies  Dg Chest [facility]  04/12/2014   CLINICAL DATA:  Fever, sore throat.  Cough.  History of lymphoma.  EXAM: PORTABLE CHEST - 1 VIEW  COMPARISON:  PET-CT 03/09/2014.  Radiograph 01/17/2014  FINDINGS: Right chest port remains in place, tip in the mid SVC. Diminished left pleural effusion with minimal residual blunting of left costophrenic angle. No airspace consolidation to suggest pneumonia. The cardiomediastinal contours are unchanged. There is no pneumothorax.  IMPRESSION:  Diminished left pleural effusion with minimal residual blunting of the costophrenic angle. No acute pulmonary process.   Electronically Signed   By: Jeb Levering M.D.   On: 04/12/2014 19:15    ASSESSMENT AND PLAN 1. Elevated troponin most likely secondary to demand ischemia secondary to acute illness and hemoglobin of 6.5. 2. EKG abnormalities may also be secondary to his previous severe anemia and to his hypokalemia, both of which are being corrected. 3. Pancytopenia secondary to non-Hodgkin's lymphoma and chemotherapy 4. Past history of paroxysmal atrial fibrillation, holding normal sinus rhythm on current therapy 5. Hypokalemia.  Repletion per primary service.  Plan: We will follow EKG over the next several days to look for serial changes. Hopefully we will see improvement. No indication at this time for further ischemic workup. We can consider outpatient Lexiscan Myoview if exertional symptoms persist after correction of anemia and underlying febrile illness. Okay to increase activity from cardiac standpoint.   Signed, Darlin Coco MD

## 2014-04-15 NOTE — Progress Notes (Signed)
[facility] PROGRESS NOTE  Erik Ramirez ZJQ:[id number] DOB: 09/28/1925 DOA: 04/12/2014 PCP: [name], MD  Brief history 81 year old male with a history of diffuse large B-cell lymphoma, diabetes mellitus, Paroxysmal atrial fibrillation, hypertension, obstructive sleep apnea presents with 3 day history of fever, myalgias, sore throat, and shortness of breath. He went to see his oncologist, Dr. Jana Hakim, on 04/12/2014. The patient was started on ciprofloxacin empirically. However, he continued to feel worse and continued to have fevers. Therefore, he presented to the emergency department. He was noted to have an absolute neutrophil count of 506, total WBC 2.3. Chest x-ray did not show any consolidation. Blood cultures were obtained and the patient was started on vancomycin and cefepime. Urinalysis was negative for pyuria. His hemoglobin was also noted to be 6.5, and the patient was transfused with 2 units PRBC. The patient denies any nausea, vomiting, diarrhea, abdominal pain, dysuria, hematochezia, melena, headache. He has noted some dyspnea on exertion with chest discomfort     Asessment/Plan: #1 neutropenic fever Unknown etiology. Chest x-ray was negative for any acute infiltrate. Urinalysis was negative. Blood cultures with no growth to date. ANC has improved. Patient was placed empirically on IV vancomycin and IV cefepime. Will discontinue IV antibiotics and place patient on oral Levaquin to complete a course of antibiotic therapy. Follow.   #2 chest discomfort/elevated troponin Likely secondary to demand ischemia per cardiology, secondary to acute illness and anemia of 6.5. EKG abnormalities were felt to be likely secondary to severe anemia and 2 hypokalemia. Per cardiology no further ischemic workup indicated at this time. Per cardiology if exertional symptoms persist after correction of underlying anemia and febrile illness and electrolyte abnormalities may consider outpatient  Lexiscan Myoview. Cardiology following and appreciate input and recommendations.  #3 acute blood loss anemia Patient's hemoglobin dropped to 6.5 from 9.2. Patient status post 2 units packed red blood cells. Patient was heme positive. Hemoglobin has improved currently at 11.4. No significant overt bleeding noted. May be secondary to intermittent NSAID use. Patient has been seen by gastroenterology and I defer an endoscopy at this time. Follow H&H.  #4 hypertension Stable. Continue current regimen of diltiazem and metoprolol.  #5 type 2 diabetes Hemoglobin A1c 6.2 on 04/13/2014. Continue sliding scale insulin.  #6 B-cell lymphoma Status post dose reduced CHOP/Rituxan started 01/10/2014. Last chemotherapy was 04/05/2014. Patient has been seen by hematology oncology and recommend an outpatient follow-up.  #7 paroxysmal atrial fibrillation Currently normal sinus rhythm. Continue diltiazem and metoprolol for rate control. Patient deemed not anticoagulation candidate secondary to thrombocytopenia. Continue aspirin.  #8 hypokalemia Repleted.  #9 prophylaxis SCDs for DVT prophylaxis.   Code Status: Full Family Communication: Updated patient and wife at bedside. Disposition Plan: Home when medically stable hopefully 1-2 days.   Consultants:  Cardiology Dr. Mare Ferrari 04/14/2014  Oncology Dr. Ron Agee 04/13/2014  Gastroenterology Dr. Carlean Purl 04/13/2014  Procedures:  Chest x-ray 04/12/2014  2 units packed red blood cells 04/13/2014  Antibiotics:  IV vancomycin 04/12/2014>>>> 04/15/2014  IV cefepime 04/12/2014>>>>> 04/15/2014  Oral Levaquin 04/16/2014  HPI/Subjective: Patient states he's feeling better. Patient with some complaints of some sore throat and postnasal drip.  Objective: Filed Vitals:   04/15/14 1300  BP: 133/68  Pulse: 67  Temp: 97.7 F (36.5 C)  Resp: 16    Intake/Output Summary (Last 24 hours) at 04/15/14 1624 Last data filed at 04/15/14 1310  Gross  per 24 hour  Intake   1530 ml  Output   4200 ml  Net  -2670 ml  Filed Weights   04/12/14 1859 04/13/14 0131  Weight: 73.483 kg (162 lb) 72.303 kg (159 lb 6.4 oz)    Exam:   General:  NAD  Cardiovascular: RRR  Respiratory: CTAB  Abdomen: Soft, nontender, nondistended, positive bowel sounds.  Musculoskeletal: No clubbing cyanosis or edema.  Data Reviewed: Basic Metabolic Panel:  Recent Labs Lab 04/12/14 1834 04/13/14 1105 04/14/14 0500 04/15/14 0545  NA 136 138 138 132*  K 3.3* 3.4* 3.1* 3.8  CL 102 101 103 100  CO2 29 31 31 28   GLUCOSE 144* 168* 125* 114*  BUN 13 11 14 13   CREATININE 0.69 0.66 0.61 0.68  CALCIUM 7.8* 7.9* 7.5* 7.8*  MG  --   --   --  1.8   Liver Function Tests:  Recent Labs Lab 04/12/14 1834 04/13/14 1105  AST 23 23  ALT 21 19  ALKPHOS 75 65  BILITOT 0.3 0.7  PROT 5.0* 4.9*  ALBUMIN 2.6* 2.4*   No results for input(s): LIPASE, AMYLASE in the last 168 hours. No results for input(s): AMMONIA in the last 168 hours. CBC:  Recent Labs Lab 04/12/14 1013 04/12/14 1834 04/13/14 1105 04/14/14 0500 04/15/14 0545  WBC 1.6* 2.3* 1.9* 2.8* 4.4  NEUTROABS 0.6* 0.5*  --  1.2* 2.3  HGB 9.2* 6.5* 10.4* 10.7* 11.4*  HCT 28.8* 20.0* 31.9* 32.7* 35.4*  MCV 84.2 85.1 85.5 84.5 84.7  PLT 116* 129* 91* 95* 120*   Cardiac Enzymes:  Recent Labs Lab 04/13/14 1105 04/13/14 1715 04/13/14 2306  TROPONINI 0.12* 0.12* 0.11*   BNP (last 3 results)  Recent Labs  01/05/14 2142  PROBNP 343.7   CBG:  Recent Labs Lab 04/14/14 1219 04/14/14 1748 04/14/14 2114 04/15/14 0741 04/15/14 1255  GLUCAP 141* 146* 149* 106* 135*    Recent Results (from the past 240 hour(s))  Blood Culture (routine x 2)     Status: None (Preliminary result)   Collection Time: 04/12/14  6:34 PM  Result Value Ref Range Status   Specimen Description BLOOD PAC  Final   Special Requests BOTTLES DRAWN AEROBIC AND ANAEROBIC 5CC  Final   Culture   Final            BLOOD CULTURE RECEIVED NO GROWTH TO DATE CULTURE WILL BE HELD FOR 5 DAYS BEFORE ISSUING A FINAL NEGATIVE REPORT Performed at [facility]    Report Status PENDING  Incomplete  Blood Culture (routine x 2)     Status: None (Preliminary result)   Collection Time: 04/12/14  7:24 PM  Result Value Ref Range Status   Specimen Description BLOOD RIGHT ANTECUBITAL  Final   Special Requests BOTTLES DRAWN AEROBIC AND ANAEROBIC 5CC  Final   Culture   Final           BLOOD CULTURE RECEIVED NO GROWTH TO DATE CULTURE WILL BE HELD FOR 5 DAYS BEFORE ISSUING A FINAL NEGATIVE REPORT Performed at [facility]    Report Status PENDING  Incomplete  Urine culture     Status: None   Collection Time: 04/12/14  9:17 PM  Result Value Ref Range Status   Specimen Description URINE, CLEAN CATCH  Final   Special Requests NONE  Final   Colony Count NO GROWTH Performed at [facility]   Final   Culture NO GROWTH Performed at [facility]   Final   Report Status 04/13/2014 FINAL  Final  Rapid strep screen     Status: None   Collection Time: 04/12/14  9:23 PM  Result Value Ref Range Status   Streptococcus, Group A Screen (Direct) NEGATIVE NEGATIVE Final    Comment: (NOTE) A Rapid Antigen test may result negative if the antigen level in the sample is below the detection level of this test. The FDA has not cleared this test as a stand-alone test therefore the rapid antigen negative result has reflexed to a Group A Strep culture.   Culture, Group A Strep     Status: None   Collection Time: 04/12/14  9:23 PM  Result Value Ref Range Status   Specimen Description THROAT  Final   Special Requests NONE  Final   Culture   Final    No Beta Hemolytic Streptococci Isolated Performed at [facility]    Report Status 04/14/2014 FINAL  Final  Respiratory virus panel (routine influenza)     Status: None   Collection Time: 04/13/14  2:14 AM  Result Value Ref Range Status   Source  - [facility] NASAL WASHINGS  Corrected    Comment: CORRECTED ON 12/30 AT 1834: PREVIOUSLY REPORTED AS NASAL WASHINGS   Respiratory Syncytial Virus A NOT DETECTED  Final   Respiratory Syncytial Virus B NOT DETECTED  Final   Influenza A NOT DETECTED  Final   Influenza B NOT DETECTED  Final   Parainfluenza 1 NOT DETECTED  Final   Parainfluenza 2 NOT DETECTED  Final   Parainfluenza 3 NOT DETECTED  Final   Metapneumovirus NOT DETECTED  Final   Rhinovirus NOT DETECTED  Final   Adenovirus NOT DETECTED  Final   Influenza A H1 NOT DETECTED  Final   Influenza A H3 NOT DETECTED  Final    Comment: (NOTE)       Normal Reference Range for each Analyte: NOT DETECTED Testing performed using the [employer] xTAG Respiratory Viral Panel test kit. The analytical performance characteristics of this assay have been determined by [facility].  The modifications have not been cleared or approved by the FDA. This assay has been validated pursuant to the CLIA regulations and is used for clinical purposes. Performed at [facility]      Studies: No results found.  Scheduled Meds: . allopurinol  300 mg Oral Daily  . aspirin  325 mg Oral Daily  . ceFEPime (MAXIPIME) IV  2 g Intravenous Q8H  . clorazepate  7.5 mg Oral BH-q7a  . clotrimazole  1 application Topical BID  . diltiazem  120 mg Oral Daily  . docusate sodium  100 mg Oral BID  . feeding supplement (ENSURE COMPLETE)  237 mL Oral Q24H  . fluconazole  100 mg Oral Daily  . folic acid  1 mg Oral Daily  . insulin aspart  0-9 Units Subcutaneous TID WC  . loratadine  10 mg Oral Daily  . metoprolol  25 mg Oral BID  . miconazole  1 application Topical BID  . multivitamin with minerals  1 tablet Oral Daily  . nystatin  5 mL Oral QID  . polyethylene glycol  17 g Oral Daily  . simvastatin  20 mg Oral Daily  . sodium chloride  3 mL Intravenous Q12H  . thiamine  100 mg Oral Daily  . vancomycin  1,000 mg Intravenous Q12H   Continuous  Infusions: . sodium chloride 50 mL/hr at 04/14/14 2042    Principal Problem:   Febrile neutropenia Active Problems:   Hypertension   Diabetes mellitus without complication   DLBCL (diffuse large B cell lymphoma)   CKD (  chronic kidney disease), stage III   Fever   Acute blood loss anemia   Pyrexia   Heme + stool   Decreased hemoglobin   Protein-calorie malnutrition, severe    Time spent: 15 minutes    Isidor Bromell M.D. [facility] Pager [phone]. If 7PM-7AM, please contact night-coverage at [email or web address], password [facility] 04/15/2014, 4:24 PM  LOS: 3 days

## 2014-04-16 DIAGNOSIS — I517 Cardiomegaly: Secondary | ICD-10-CM

## 2014-04-16 DIAGNOSIS — R778 Other specified abnormalities of plasma proteins: Secondary | ICD-10-CM | POA: Insufficient documentation

## 2014-04-16 DIAGNOSIS — R7989 Other specified abnormal findings of blood chemistry: Secondary | ICD-10-CM | POA: Insufficient documentation

## 2014-04-16 LAB — GLUCOSE, CAPILLARY
GLUCOSE-CAPILLARY: 98 mg/dL (ref 70–99)
Glucose-Capillary: 138 mg/dL — ABNORMAL HIGH (ref 70–99)
Glucose-Capillary: 139 mg/dL — ABNORMAL HIGH (ref 70–99)
Glucose-Capillary: 119 mg/dL — ABNORMAL HIGH (ref 70–99)

## 2014-04-16 LAB — BASIC METABOLIC PANEL
ANION GAP: 7 (ref 5–15)
BUN: 13 mg/dL (ref 6–23)
CHLORIDE: 98 meq/L (ref 96–112)
CO2: 31 mmol/L (ref 19–32)
Calcium: 8.1 mg/dL — ABNORMAL LOW (ref 8.4–10.5)
Creatinine, Ser: 0.68 mg/dL (ref 0.50–1.35)
GFR calc Af Amer: >90 mL/min (ref >90)
GFR, EST NON AFRICAN AMERICAN: 84 mL/min — AB (ref >90)
Glucose, Bld: 109 mg/dL — ABNORMAL HIGH (ref 70–99)
Potassium: 4.2 mmol/L (ref 3.5–5.1)
Sodium: 136 mmol/L (ref 135–145)

## 2014-04-16 LAB — CBC WITH DIFFERENTIAL/PLATELET
Basophils Absolute: 0.1 10*3/uL (ref 0.0–0.1)
Basophils Relative: 1 % (ref 0–1)
EOS ABS: 0.2 10*3/uL (ref 0.0–0.7)
EOS PCT: 3 % (ref 0–5)
HCT: 36.7 % — ABNORMAL LOW (ref 39.0–52.0)
Hemoglobin: 11.6 g/dL — ABNORMAL LOW (ref 13.0–17.0)
LYMPHS PCT: 22 % (ref 12–46)
Lymphs Abs: 1.3 10*3/uL (ref 0.7–4.0)
MCH: 27 pg (ref 26.0–34.0)
MCHC: 31.6 g/dL (ref 30.0–36.0)
MCV: 85.3 fL (ref 78.0–100.0)
Monocytes Absolute: 1 10*3/uL (ref 0.1–1.0)
Monocytes Relative: 16 % — ABNORMAL HIGH (ref 3–12)
Neutro Abs: 3.5 10*3/uL (ref 1.7–7.7)
Neutrophils Relative %: 58 % (ref 43–77)
Platelets: 127 10*3/uL — ABNORMAL LOW (ref 150–400)
RBC: 4.3 MIL/uL (ref 4.22–5.81)
RDW: 16.7 % — AB (ref 11.5–15.5)
WBC: 6.1 10*3/uL (ref 4.0–10.5)

## 2014-04-16 NOTE — Progress Notes (Signed)
   04/16/14 1507  What Happened  Was fall witnessed? No  Was patient injured? No  Patient found in bathroom  Found by No one-pt stated they fell  Stated prior activity bathroom-unassisted  Follow Up  MD notified yes  Time MD notified 1540  Family notified Yes-comment  Time family notified 66 (Family at bedside)  Additional tests No  Vitals  Temp Source Oral  BP 124/70 mmHg  Patient Position (if appropriate) Lying  Pulse Rate 81  Pulse Rate Source Dinamap  Resp 18  Oxygen Therapy  SpO2 96 %   Patient escorted to bath by RN. Patient was steady and stable on feet in bathroom. Patient stated that, "Knee just gave out on me". MD notified. No further testing needed per MD.

## 2014-04-16 NOTE — Progress Notes (Signed)
Echocardiogram 2D Echocardiogram has been performed.  Erik Ramirez 04/16/2014, 3:05 PM

## 2014-04-16 NOTE — Progress Notes (Signed)
[facility] PROGRESS NOTE  Erik Ramirez WRU:[id number] DOB: 10/06/1925 DOA: 04/12/2014 PCP: [name], MD  Brief history 81 year old male with a history of diffuse large B-cell lymphoma, diabetes mellitus, Paroxysmal atrial fibrillation, hypertension, obstructive sleep apnea presents with 3 day history of fever, myalgias, sore throat, and shortness of breath. He went to see his oncologist, Dr. Jana Hakim, on 04/12/2014. The patient was started on ciprofloxacin empirically. However, he continued to feel worse and continued to have fevers. Therefore, he presented to the emergency department. He was noted to have an absolute neutrophil count of 506, total WBC 2.3. Chest x-ray did not show any consolidation. Blood cultures were obtained and the patient was started on vancomycin and cefepime. Urinalysis was negative for pyuria. His hemoglobin was also noted to be 6.5, and the patient was transfused with 2 units PRBC. The patient denies any nausea, vomiting, diarrhea, abdominal pain, dysuria, hematochezia, melena, headache. He has noted some dyspnea on exertion with chest discomfort     Asessment/Plan: #1 neutropenic fever Unknown etiology. Chest x-ray was negative for any acute infiltrate. Urinalysis was negative. Blood cultures with no growth to date. ANC has improved. Patient was placed empirically on IV vancomycin and IV cefepime. Continue oral Levaquin to complete a five-day course of antibiotic therapy. Follow.   #2 chest discomfort/elevated troponin Likely secondary to demand ischemia per cardiology, secondary to acute illness and anemia of 6.5. EKG abnormalities were felt to be likely secondary to severe anemia and 2 hypokalemia. Per cardiology no further ischemic workup indicated at this time. Per cardiology if exertional symptoms persist after correction of underlying anemia and febrile illness and electrolyte abnormalities may consider outpatient Lexiscan Myoview. 2-D echo pending.  Cardiology following and appreciate input and recommendations.  #3 acute blood loss anemia Patient's hemoglobin dropped to 6.5 from 9.2. Patient status post 2 units packed red blood cells. Patient was heme positive. Hemoglobin has improved currently at 11.4. No significant overt bleeding noted. May be secondary to intermittent NSAID use. Patient has been seen by gastroenterology and I defer an endoscopy at this time. Follow H&H.  #4 hypertension Stable. Continue current regimen of diltiazem and metoprolol.  #5 type 2 diabetes Hemoglobin A1c 6.2 on 04/13/2014. Continue sliding scale insulin.  #6 B-cell lymphoma Status post dose reduced CHOP/Rituxan started 01/10/2014. Last chemotherapy was 04/05/2014. Patient has been seen by hematology oncology and recommend an outpatient follow-up.  #7 paroxysmal atrial fibrillation Currently normal sinus rhythm. Continue diltiazem and metoprolol for rate control. Patient deemed not anticoagulation candidate secondary to thrombocytopenia. Continue aspirin.  #8 hypokalemia Repleted.  #9 prophylaxis SCDs for DVT prophylaxis.   Code Status: Full Family Communication: Updated patient and wife at bedside. Disposition Plan: Home when medically stable hopefully 1-2 days.   Consultants:  Cardiology Dr. Mare Ferrari 04/14/2014  Oncology Dr. Ron Agee 04/13/2014  Gastroenterology Dr. Carlean Purl 04/13/2014  Procedures:  Chest x-ray 04/12/2014  2 units packed red blood cells 04/13/2014  Antibiotics:  IV vancomycin 04/12/2014>>>> 04/15/2014  IV cefepime 04/12/2014>>>>> 04/15/2014  Oral Levaquin 04/16/2014  HPI/Subjective: Patient states he's feeling better. Patient with some complaints of some sore throat and postnasal drip.  Objective: Filed Vitals:   04/16/14 0957  BP: 136/71  Pulse: 79  Temp:   Resp: 16    Intake/Output Summary (Last 24 hours) at 04/16/14 1357 Last data filed at 04/16/14 0845  Gross per 24 hour  Intake    360 ml   Output    875 ml  Net   -515 ml   [employer]  04/12/14 1859 04/13/14 0131  Weight: 73.483 kg (162 lb) 72.303 kg (159 lb 6.4 oz)    Exam:   General:  NAD  Cardiovascular: RRR  Respiratory: CTAB  Abdomen: Soft, nontender, nondistended, positive bowel sounds.  Musculoskeletal: No clubbing cyanosis or edema.  Data Reviewed: Basic Metabolic Panel:  Recent Labs Lab 04/12/14 1834 04/13/14 1105 04/14/14 0500 04/15/14 0545 04/16/14 0500  NA 136 138 138 132* 136  K 3.3* 3.4* 3.1* 3.8 4.2  CL 102 101 103 100 98  CO2 29 31 31 28 31   GLUCOSE 144* 168* 125* 114* 109*  BUN 13 11 14 13 13   CREATININE 0.69 0.66 0.61 0.68 0.68  CALCIUM 7.8* 7.9* 7.5* 7.8* 8.1*  MG  --   --   --  1.8  --    Liver Function Tests:  Recent Labs Lab 04/12/14 1834 04/13/14 1105  AST 23 23  ALT 21 19  ALKPHOS 75 65  BILITOT 0.3 0.7  PROT 5.0* 4.9*  ALBUMIN 2.6* 2.4*   No results for input(s): LIPASE, AMYLASE in the last 168 hours. No results for input(s): AMMONIA in the last 168 hours. CBC:  Recent Labs Lab 04/12/14 1013 04/12/14 1834 04/13/14 1105 04/14/14 0500 04/15/14 0545 04/16/14 0500  WBC 1.6* 2.3* 1.9* 2.8* 4.4 6.1  NEUTROABS 0.6* 0.5*  --  1.2* 2.3 3.5  HGB 9.2* 6.5* 10.4* 10.7* 11.4* 11.6*  HCT 28.8* 20.0* 31.9* 32.7* 35.4* 36.7*  MCV 84.2 85.1 85.5 84.5 84.7 85.3  PLT 116* 129* 91* 95* 120* 127*   Cardiac Enzymes:  Recent Labs Lab 04/13/14 1105 04/13/14 1715 04/13/14 2306  TROPONINI 0.12* 0.12* 0.11*   BNP (last 3 results)  Recent Labs  01/05/14 2142  PROBNP 343.7   CBG:  Recent Labs Lab 04/15/14 1255 04/15/14 1751 04/15/14 2122 04/16/14 0755 04/16/14 1129  GLUCAP 135* 125* 149* 98 138*    Recent Results (from the past 240 hour(s))  Blood Culture (routine x 2)     Status: None (Preliminary result)   Collection Time: 04/12/14  6:34 PM  Result Value Ref Range Status   Specimen Description BLOOD PAC  Final   Special Requests BOTTLES DRAWN  AEROBIC AND ANAEROBIC 5CC  Final   Culture   Final           BLOOD CULTURE RECEIVED NO GROWTH TO DATE CULTURE WILL BE HELD FOR 5 DAYS BEFORE ISSUING A FINAL NEGATIVE REPORT Performed at [facility]    Report Status PENDING  Incomplete  Blood Culture (routine x 2)     Status: None (Preliminary result)   Collection Time: 04/12/14  7:24 PM  Result Value Ref Range Status   Specimen Description BLOOD RIGHT ANTECUBITAL  Final   Special Requests BOTTLES DRAWN AEROBIC AND ANAEROBIC 5CC  Final   Culture   Final           BLOOD CULTURE RECEIVED NO GROWTH TO DATE CULTURE WILL BE HELD FOR 5 DAYS BEFORE ISSUING A FINAL NEGATIVE REPORT Performed at [facility]    Report Status PENDING  Incomplete  Urine culture     Status: None   Collection Time: 04/12/14  9:17 PM  Result Value Ref Range Status   Specimen Description URINE, CLEAN CATCH  Final   Special Requests NONE  Final   Colony Count NO GROWTH Performed at [facility]   Final   Culture NO GROWTH Performed at [facility]   Final   Report Status 04/13/2014 FINAL  Final  Rapid strep screen     Status: None   Collection Time: 04/12/14  9:23 PM  Result Value Ref Range Status   Streptococcus, Group A Screen (Direct) NEGATIVE NEGATIVE Final    Comment: (NOTE) A Rapid Antigen test may result negative if the antigen level in the sample is below the detection level of this test. The FDA has not cleared this test as a stand-alone test therefore the rapid antigen negative result has reflexed to a Group A Strep culture.   Culture, Group A Strep     Status: None   Collection Time: 04/12/14  9:23 PM  Result Value Ref Range Status   Specimen Description THROAT  Final   Special Requests NONE  Final   Culture   Final    No Beta Hemolytic Streptococci Isolated Performed at [facility]    Report Status 04/14/2014 FINAL  Final  Respiratory virus panel (routine influenza)     Status: None   Collection  Time: 04/13/14  2:14 AM  Result Value Ref Range Status   Source - [facility] NASAL WASHINGS  Corrected    Comment: CORRECTED ON 12/30 AT 1834: PREVIOUSLY REPORTED AS NASAL WASHINGS   Respiratory Syncytial Virus A NOT DETECTED  Final   Respiratory Syncytial Virus B NOT DETECTED  Final   Influenza A NOT DETECTED  Final   Influenza B NOT DETECTED  Final   Parainfluenza 1 NOT DETECTED  Final   Parainfluenza 2 NOT DETECTED  Final   Parainfluenza 3 NOT DETECTED  Final   Metapneumovirus NOT DETECTED  Final   Rhinovirus NOT DETECTED  Final   Adenovirus NOT DETECTED  Final   Influenza A H1 NOT DETECTED  Final   Influenza A H3 NOT DETECTED  Final    Comment: (NOTE)       Normal Reference Range for each Analyte: NOT DETECTED Testing performed using the Luminex xTAG Respiratory Viral Panel test kit. The analytical performance characteristics of this assay have been determined by [facility].  The modifications have not been cleared or approved by the FDA. This assay has been validated pursuant to the CLIA regulations and is used for clinical purposes. Performed at [facility]      Studies: No results found.  Scheduled Meds: . allopurinol  300 mg Oral Daily  . aspirin  325 mg Oral Daily  . clorazepate  7.5 mg Oral BH-q7a  . clotrimazole  1 application Topical BID  . diltiazem  120 mg Oral Daily  . docusate sodium  100 mg Oral BID  . feeding supplement (ENSURE COMPLETE)  237 mL Oral Q24H  . fluconazole  100 mg Oral Daily  . folic acid  1 mg Oral Daily  . insulin aspart  0-9 Units Subcutaneous TID WC  . levofloxacin  500 mg Oral Daily  . loratadine  10 mg Oral Daily  . metoprolol  25 mg Oral BID  . miconazole  1 application Topical BID  . multivitamin with minerals  1 tablet Oral Daily  . nystatin  5 mL Oral QID  . polyethylene glycol  17 g Oral Daily  . pseudoephedrine  120 mg Oral BID  . simvastatin  20 mg Oral Daily  . sodium chloride  3 mL Intravenous Q12H  .  thiamine  100 mg Oral Daily   Continuous Infusions:    Principal Problem:   Febrile neutropenia Active Problems:   Hypertension   Diabetes mellitus without complication   DLBCL (diffuse  large B cell lymphoma)   CKD (chronic kidney disease), stage III   Fever   Acute blood loss anemia   Pyrexia   Heme + stool   Decreased hemoglobin   Protein-calorie malnutrition, severe    Time spent: 44 minutes    Isel Skufca M.D. [facility] Pager [phone]. If 7PM-7AM, please contact night-coverage at [email or web address], password [facility] 04/16/2014, 1:57 PM  LOS: 4 days

## 2014-04-16 NOTE — Progress Notes (Signed)
Pt comfortable placing himself on and off of CPAP QHS.  Pt is using his home mask and tubing with our machine.  Pt is comfortable and demonstrated to me how to cut the machine on.  Machine plugged into red outlet and humidifier filled with sterile water.

## 2014-04-16 NOTE — Progress Notes (Signed)
Patient Name: Erik Ramirez Date of Encounter: 04/16/2014     Principal Problem:   Febrile neutropenia Active Problems:   Hypertension   Diabetes mellitus without complication   DLBCL (diffuse large B cell lymphoma)   CKD (chronic kidney disease), stage III   Fever   Acute blood loss anemia   Pyrexia   Heme + stool   Decreased hemoglobin   Protein-calorie malnutrition, severe    SUBJECTIVE 81 yo who is admitted with fevers, chills, hx of lymphoma .  We were consulted for shortness of breath.  ECG now shows ant. TWI.  - new since Sept.  Troponin has been mildly elevated.   0.11 - 0.12  Echo on 9/24 shows normal LV function.      No chest pain or dyspnea.   CURRENT MEDS . allopurinol  300 mg Oral Daily  . aspirin  325 mg Oral Daily  . clorazepate  7.5 mg Oral BH-q7a  . clotrimazole  1 application Topical BID  . diltiazem  120 mg Oral Daily  . docusate sodium  100 mg Oral BID  . feeding supplement (ENSURE COMPLETE)  237 mL Oral Q24H  . fluconazole  100 mg Oral Daily  . folic acid  1 mg Oral Daily  . insulin aspart  0-9 Units Subcutaneous TID WC  . levofloxacin  500 mg Oral Daily  . loratadine  10 mg Oral Daily  . metoprolol  25 mg Oral BID  . miconazole  1 application Topical BID  . multivitamin with minerals  1 tablet Oral Daily  . nystatin  5 mL Oral QID  . polyethylene glycol  17 g Oral Daily  . pseudoephedrine  120 mg Oral BID  . simvastatin  20 mg Oral Daily  . sodium chloride  3 mL Intravenous Q12H  . thiamine  100 mg Oral Daily    OBJECTIVE  Filed Vitals:   04/15/14 1300 04/15/14 2115 04/15/14 2116 04/16/14 0455  BP: 133/68  132/83 131/96  Pulse: 67  68 58  Temp: 97.7 F (36.5 C)  98.7 F (37.1 C) 97.5 F (36.4 C)  TempSrc: Oral  Oral Axillary  Resp: 16  18 16   Height:      Weight:      SpO2: 98% 95% 97% 96%    Intake/Output Summary (Last 24 hours) at 04/16/14 0814 Last data filed at 04/16/14 0457  Gross per 24 hour  Intake    240 ml    Output   1525 ml  Net  -1285 ml   Filed Weights   04/12/14 1859 04/13/14 0131  Weight: 162 lb (73.483 kg) 159 lb 6.4 oz (72.303 kg)    PHYSICAL EXAM  General: Pleasant, NAD. Neuro: Alert and oriented X 3. Moves all extremities spontaneously. Psych: Normal affect. HEENT:  Normal  Neck: Supple without bruits or JVD. Lungs:  Resp regular and unlabored, CTA. Heart: RRR no s3, s4, or murmurs. Abdomen: Soft, non-tender, non-distended, BS + x 4.  Extremities: No clubbing, cyanosis or edema. DP/PT/Radials 2+ and equal bilaterally.  Accessory Clinical Findings  CBC  Recent Labs  04/15/14 0545 04/16/14 0500  WBC 4.4 6.1  NEUTROABS 2.3 3.5  HGB 11.4* 11.6*  HCT 35.4* 36.7*  MCV 84.7 85.3  PLT 120* 671*   Basic Metabolic Panel  Recent Labs  04/15/14 0545 04/16/14 0500  NA 132* 136  K 3.8 4.2  CL 100 98  CO2 28 31  GLUCOSE 114* 109*  BUN 13 13  CREATININE 0.68  0.68  CALCIUM 7.8* 8.1*  MG 1.8  --    Liver Function Tests  Recent Labs  04/13/14 1105  AST 23  ALT 19  ALKPHOS 65  BILITOT 0.7  PROT 4.9*  ALBUMIN 2.4*   No results for input(s): LIPASE, AMYLASE in the last 72 hours. Cardiac Enzymes  Recent Labs  04/13/14 1105 04/13/14 1715 04/13/14 2306  TROPONINI 0.12* 0.12* 0.11*   BNP Invalid input(s): POCBNP D-Dimer No results for input(s): DDIMER in the last 72 hours. Hemoglobin A1C  Recent Labs  04/13/14 1105  HGBA1C 6.2*   Fasting Lipid Panel No results for input(s): CHOL, HDL, LDLCALC, TRIG, CHOLHDL, LDLDIRECT in the last 72 hours. Thyroid Function Tests  Recent Labs  04/13/14 1105  TSH 1.863    TELE  NSR. HR 78  ECG  pending  Radiology/Studies  Dg Chest Port 1 View  04/12/2014   CLINICAL DATA:  Fever, sore throat.  Cough.  History of lymphoma.  EXAM: PORTABLE CHEST - 1 VIEW  COMPARISON:  PET-CT 03/09/2014.  Radiograph 01/17/2014  FINDINGS: Right chest port remains in place, tip in the mid SVC. Diminished left pleural  effusion with minimal residual blunting of left costophrenic angle. No airspace consolidation to suggest pneumonia. The cardiomediastinal contours are unchanged. There is no pneumothorax.  IMPRESSION: Diminished left pleural effusion with minimal residual blunting of the costophrenic angle. No acute pulmonary process.   Electronically Signed   By: Jeb Levering M.D.   On: 04/12/2014 19:15    ASSESSMENT AND PLAN 1. Elevated troponin most likely secondary to demand ischemia secondary to acute illness and hemoglobin of 6.5. LV function was normal in Sept. Will repeat today since his ECG abnormalities are new since that time.  2. EKG abnormalities may also be secondary to his previous severe anemia and to his hypokalemia, both of which are being corrected. 3. Pancytopenia secondary to non-Hodgkin's lymphoma and chemotherapy 4. Past history of paroxysmal atrial fibrillation, holding normal sinus rhythm on current therapy 5. Hypokalemia.  Repletion per primary service.   Thayer Headings, Brooke Bonito., MD, [facility] 04/16/2014, 8:23 AM [address]  [place] Pager [phone]

## 2014-04-17 LAB — CBC WITH DIFFERENTIAL/PLATELET
BASOS PCT: 1 % (ref 0–1)
Basophils Absolute: 0.1 10*3/uL (ref 0.0–0.1)
Eosinophils Absolute: 0.1 10*3/uL (ref 0.0–0.7)
Eosinophils Relative: 2 % (ref 0–5)
HCT: 36.4 % — ABNORMAL LOW (ref 39.0–52.0)
HEMOGLOBIN: 11.7 g/dL — AB (ref 13.0–17.0)
Lymphocytes Relative: 23 % (ref 12–46)
Lymphs Abs: 1.3 10*3/uL (ref 0.7–4.0)
MCH: 27.3 pg (ref 26.0–34.0)
MCHC: 32.1 g/dL (ref 30.0–36.0)
MCV: 84.8 fL (ref 78.0–100.0)
Monocytes Absolute: 0.8 10*3/uL (ref 0.1–1.0)
Monocytes Relative: 14 % — ABNORMAL HIGH (ref 3–12)
NEUTROS PCT: 60 % (ref 43–77)
Neutro Abs: 3.5 10*3/uL (ref 1.7–7.7)
Platelets: 160 10*3/uL (ref 150–400)
RBC: 4.29 MIL/uL (ref 4.22–5.81)
RDW: 16.4 % — ABNORMAL HIGH (ref 11.5–15.5)
WBC: 5.8 10*3/uL (ref 4.0–10.5)

## 2014-04-17 LAB — GLUCOSE, CAPILLARY
GLUCOSE-CAPILLARY: 137 mg/dL — AB (ref 70–99)
GLUCOSE-CAPILLARY: 151 mg/dL — AB (ref 70–99)
Glucose-Capillary: 118 mg/dL — ABNORMAL HIGH (ref 70–99)
Glucose-Capillary: 122 mg/dL — ABNORMAL HIGH (ref 70–99)

## 2014-04-17 MED ORDER — ZOLPIDEM TARTRATE 5 MG PO TABS
5.0000 mg | ORAL_TABLET | Freq: Once | ORAL | Status: AC
  Administered 2014-04-18: 5 mg via ORAL
  Filled 2014-04-17: qty 1

## 2014-04-17 MED ORDER — FLUCONAZOLE 100 MG PO TABS: 100.0000 mg | ORAL_TABLET | Freq: Every day | ORAL | Status: DC

## 2014-04-17 MED ORDER — DSS 100 MG PO CAPS: 100.0000 mg | ORAL_CAPSULE | Freq: Two times a day (BID) | ORAL | Status: DC

## 2014-04-17 NOTE — Progress Notes (Signed)
Pt comfortable placing mask on and off himself and cutting machine on.  Pt demonstrated to me how to cut machine on and off.  Humidifier filled with sterile water and machine plugged into red outlet.

## 2014-04-17 NOTE — Progress Notes (Signed)
CARE MANAGEMENT NOTE 04/17/2014  Patient:  Erik Ramirez,Erik Ramirez   Account Number:  [id number]  Date Initiated:  04/17/2014  Documentation initiated by:  [facility]  Subjective/Objective Assessment:     Action/Plan:   llives at home with wife   Anticipated DC Date:     Anticipated DC Plan:  [place]  CM consult      [place]   Choice offered to / List presented to:  C-1 Patient        [place] arranged  [place].   Status of service:  Completed, signed off Medicare Important Message given?  YES (If response is "NO", the following Medicare IM given date fields will be blank) Date Medicare IM given:  04/17/2014 Medicare IM given by:  [facility] Date Additional Medicare IM given:   Additional Medicare IM given by:    Discharge Disposition:  [place]  Per UR Regulation:    If discussed at Long Length of Stay Meetings, dates discussed:    Comments:  04/17/2014 1745 NCM spoke to pt and offered choice for [facility]. Pt requested [facility] for HH. Referral sent to [facility] for [facility]. Pt states he has RW at home. Jonnie Finner RN CCM Case Mgmt phone [phone]

## 2014-04-17 NOTE — Progress Notes (Signed)
PROGRESS NOTE  Subjective:   81 Ramirez who is admitted with fevers, chills, hx of lymphoma . We were consulted for shortness of breath. ECG now shows ant. TWI. - new since Sept.  Troponin has been mildly elevated. 0.11 - 0.12  Echo on 9/24 shows normal LV function.   No chest pain or dyspnea.   Objective:    Vital Signs:   Temp:  [97.7 F (36.5 C)-98 F (36.7 C)] 97.7 F (36.5 C) (01/03 0532) Pulse Rate:  [70-81] 70 (01/03 0532) Resp:  [16-18] 18 (01/03 0532) BP: (119-150)/(67-73) 150/70 mmHg (01/03 0532) SpO2:  [96 %-98 %] 98 % (01/03 0532)  Last BM Date: 04/16/14   24-hour weight change: Weight change:   Weight trends: Filed Weights   04/12/14 1859 04/13/14 0131  Weight: 162 lb (73.483 kg) 159 lb 6.4 oz (72.303 kg)    Intake/Output:  01/02 0701 - 01/03 0700 In: 400 [P.O.:360] Out: 2550 [Urine:2550]     Physical Exam: BP 150/70 mmHg  Pulse 70  Temp(Src) 97.7 F (36.5 C) (Oral)  Resp 18  Ht 5\' 7"  (1.702 m)  Wt 159 lb 6.4 oz (72.303 kg)  BMI 24.96 kg/m2  SpO2 98%  Wt Readings from Last 3 Encounters:  04/13/14 159 lb 6.4 oz (72.303 kg)  04/12/14 162 lb 8 oz (73.71 kg)  04/04/14 159 lb 6.4 oz (72.303 kg)   General: Pleasant, NAD. Neuro: Alert and oriented X 3. Moves all extremities spontaneously. Psych: Normal affect. HEENT: Normal Neck: Supple without bruits or JVD. Lungs: Resp regular and unlabored, CTA. Heart: RRR no s3, s4, or murmurs. Abdomen: Soft, non-tender, non-distended, BS + x 4.  Extremities: No clubbing, cyanosis or edema. DP/PT/Radials 2+ and equal bilaterally.  Labs: BMET:  Recent Labs  04/15/14 0545 04/16/14 0500  NA 132* 136  K 3.8 4.2  CL 100 98  CO2 Erik 31  GLUCOSE 114* 109*  BUN 13 13  CREATININE 0.68 0.68  CALCIUM 7.8* 8.1*  MG 1.8  --     Liver function tests: No results for input(s): AST, ALT, ALKPHOS, BILITOT, PROT, ALBUMIN in the last 72 hours. No results for input(s): LIPASE, AMYLASE  in the last 72 hours.  CBC:  Recent Labs  04/16/14 0500 04/17/14 0446  WBC 6.1 5.8  NEUTROABS 3.5 3.5  HGB 11.6* 11.7*  HCT 36.7* 36.4*  MCV 85.3 84.8  PLT 127* 160    Cardiac Enzymes: No results for input(s): CKTOTAL, CKMB, TROPONINI in the last 72 hours.  Coagulation Studies: No results for input(s): LABPROT, INR in the last 72 hours.  Other  Recent Labs  04/15/14 0545  RETICCTPCT 2.8     Other results:  Tele:  NSR    Medications:    Infusions:    Scheduled Medications: . allopurinol  300 mg Oral Daily  . aspirin  325 mg Oral Daily  . clorazepate  7.5 mg Oral BH-q7a  . clotrimazole  1 application Topical BID  . diltiazem  120 mg Oral Daily  . docusate sodium  100 mg Oral BID  . feeding supplement (ENSURE COMPLETE)  237 mL Oral Q24H  . fluconazole  100 mg Oral Daily  . folic acid  1 mg Oral Daily  . insulin aspart  0-9 Units Subcutaneous TID WC  . levofloxacin  500 mg Oral Daily  . loratadine  10 mg Oral Daily  . metoprolol  25 mg Oral BID  . miconazole  1 application Topical BID  .  multivitamin with minerals  1 tablet Oral Daily  . nystatin  5 mL Oral QID  . polyethylene glycol  17 g Oral Daily  . pseudoephedrine  120 mg Oral BID  . simvastatin  20 mg Oral Daily  . sodium chloride  3 mL Intravenous Q12H  . thiamine  100 mg Oral Daily    Assessment/ Plan:   Principal Problem:   Febrile neutropenia Active Problems:   Hypertension   Diabetes mellitus without complication   DLBCL (diffuse large B cell lymphoma)   CKD (chronic kidney disease), stage III   Fever   Acute blood loss anemia   Pyrexia   Heme + stool   Decreased hemoglobin   Protein-calorie malnutrition, severe   Elevated troponin  ASSESSMENT AND PLAN 1. Elevated troponin most likely secondary to demand ischemia secondary to acute illness and hemoglobin of 6.5. LV function was normal in Sept. Will repeat today since his ECG abnormalities are new since that time.  Should be  read by later this am.  Possible DC today if echo looks ok.   2. EKG abnormalities may also be secondary to his previous severe anemia and to his hypokalemia, both of which are being corrected. 3. Pancytopenia secondary to non-Hodgkin's lymphoma and chemotherapy 4. Past history of paroxysmal atrial fibrillation, holding normal sinus rhythm on current therapy    Disposition:  Length of Stay: 5  Thayer Headings, Brooke Bonito., MD, [facility] 04/17/2014, 8:19 AM Office [phone] Pager [phone]

## 2014-04-17 NOTE — Progress Notes (Signed)
[facility] PROGRESS NOTE  Erik Ramirez QBH:[id number] DOB: Jul 31, 1925 DOA: 04/12/2014 PCP: [name], MD  Brief history 81 year old male with a history of diffuse large B-cell lymphoma, diabetes mellitus, Paroxysmal atrial fibrillation, hypertension, obstructive sleep apnea presents with 3 day history of fever, myalgias, sore throat, and shortness of breath. He went to see his oncologist, Dr. Jana Hakim, on 04/12/2014. The patient was started on ciprofloxacin empirically. However, he continued to feel worse and continued to have fevers. Therefore, he presented to the emergency department. He was noted to have an absolute neutrophil count of 506, total WBC 2.3. Chest x-ray did not show any consolidation. Blood cultures were obtained and the patient was started on vancomycin and cefepime. Urinalysis was negative for pyuria. His hemoglobin was also noted to be 6.5, and the patient was transfused with 2 units PRBC. The patient denies any nausea, vomiting, diarrhea, abdominal pain, dysuria, hematochezia, melena, headache. He has noted some dyspnea on exertion with chest discomfort     Asessment/Plan: #1 neutropenic fever Unknown etiology. Chest x-ray was negative for any acute infiltrate. Urinalysis was negative. Blood cultures with no growth to date. ANC has improved. Patient was placed empirically on IV vancomycin and IV cefepime. Continue oral Levaquin to complete a five-day course of antibiotic therapy. Follow.   #2 chest discomfort/elevated troponin Likely secondary to demand ischemia per cardiology, secondary to acute illness and anemia of 6.5. EKG abnormalities were felt to be likely secondary to severe anemia and 2 hypokalemia. Per cardiology no further ischemic workup indicated at this time. Per cardiology if exertional symptoms persist after correction of underlying anemia and febrile illness and electrolyte abnormalities may consider outpatient Lexiscan Myoview. 2-D echo pending.  Cardiology following and appreciate input and recommendations.  #3 acute blood loss anemia Patient's hemoglobin dropped to 6.5 from 9.2. Patient status post 2 units packed red blood cells. Patient was heme positive. Hemoglobin has improved currently at 11.4. No significant overt bleeding noted. May be secondary to intermittent NSAID use. Patient has been seen by gastroenterology and I defer an endoscopy at this time. Follow H&H.  #4 hypertension Stable. Continue current regimen of diltiazem and metoprolol.  #5 type 2 diabetes Hemoglobin A1c 6.2 on 04/13/2014. CBG 118-137. Continue sliding scale insulin.  #6 B-cell lymphoma Status post dose reduced CHOP/Rituxan started 01/10/2014. Last chemotherapy was 04/05/2014. Patient has been seen by hematology oncology and recommend an outpatient follow-up.  #7 paroxysmal atrial fibrillation Currently normal sinus rhythm. Continue diltiazem and metoprolol for rate control. Patient deemed not anticoagulation candidate secondary to thrombocytopenia. Continue aspirin.  #8 hypokalemia Repleted.  #9 prophylaxis SCDs for DVT prophylaxis.   Code Status: Full Family Communication: Updated patient and wife at bedside. Disposition Plan: Home when medically stable hopefully 1-2 days.   Consultants:  Cardiology Dr. Mare Ferrari 04/14/2014  Oncology Dr. Ron Agee 04/13/2014  Gastroenterology Dr. Carlean Purl 04/13/2014  Procedures:  Chest x-ray 04/12/2014  2 units packed red blood cells 04/13/2014  Antibiotics:  IV vancomycin 04/12/2014>>>> 04/15/2014  IV cefepime 04/12/2014>>>>> 04/15/2014  Oral Levaquin 04/16/2014  HPI/Subjective: Patient states he's feeling better. Patient with no nausea or emesis. Patient stated was feeling weak early on this morning.  Objective: Filed Vitals:   04/17/14 0955  BP: 148/76  Pulse: 80  Temp:   Resp:     Intake/Output Summary (Last 24 hours) at 04/17/14 1634 Last data filed at 04/17/14 0900  Gross per  24 hour  Intake    200 ml  Output   1150 ml  Net   -950  ml   Filed Weights   04/12/14 1859 04/13/14 0131  Weight: 73.483 kg (162 lb) 72.303 kg (159 lb 6.4 oz)    Exam:   General:  NAD  Cardiovascular: RRR  Respiratory: CTAB  Abdomen: Soft, nontender, nondistended, positive bowel sounds.  Musculoskeletal: No clubbing cyanosis or edema.  Data Reviewed: Basic Metabolic Panel:  Recent Labs Lab 04/12/14 1834 04/13/14 1105 04/14/14 0500 04/15/14 0545 04/16/14 0500  NA 136 138 138 132* 136  K 3.3* 3.4* 3.1* 3.8 4.2  CL 102 101 103 100 98  CO2 _0 GLUCOSE 144* 168* 125* 114* 109*  BUN _1 CREATININE 0.69 0.66 0.61 0.68 0.68  CALCIUM 7.8* 7.9* 7.5* 7.8* 8.1*  MG  --   --   --  1.8  --    Liver Function Tests:  Recent Labs Lab 04/12/14 1834 04/13/14 1105  AST 23 23  ALT 21 19  ALKPHOS 75 65  BILITOT 0.3 0.7  PROT 5.0* 4.9*  ALBUMIN 2.6* 2.4*   No results for input(s): LIPASE, AMYLASE in the last 168 hours. No results for input(s): AMMONIA in the last 168 hours. CBC:  Recent Labs Lab 04/12/14 1834 04/13/14 1105 04/14/14 0500 04/15/14 0545 04/16/14 0500 04/17/14 0446  WBC 2.3* 1.9* 2.8* 4.4 6.1 5.8  NEUTROABS 0.5*  --  1.2* 2.3 3.5 3.5  HGB 6.5* 10.4* 10.7* 11.4* 11.6* 11.7*  HCT 20.0* 31.9* 32.7* 35.4* 36.7* 36.4*  MCV 85.1 85.5 84.5 84.7 85.3 84.8  PLT 129* 91* 95* 120* 127* 160   Cardiac Enzymes:  Recent Labs Lab 04/13/14 1105 04/13/14 1715 04/13/14 2306  TROPONINI 0.12* 0.12* 0.11*   BNP (last 3 results)  Recent Labs  01/05/14 2142  PROBNP 343.7   CBG:  Recent Labs Lab 04/16/14 1129 04/16/14 1658 04/16/14 2210 04/17/14 0740 04/17/14 1245  GLUCAP 138* 139* 119* 118* 137*    Recent Results (from the past 240 hour(s))  Blood Culture (routine x 2)     Status: None (Preliminary result)   Collection Time: 04/12/14  6:34 PM  Result Value Ref Range Status   Specimen Description BLOOD PAC  Final    Special Requests BOTTLES DRAWN AEROBIC AND ANAEROBIC 5CC  Final   Culture   Final           BLOOD CULTURE RECEIVED NO GROWTH TO DATE CULTURE WILL BE HELD FOR 5 DAYS BEFORE ISSUING A FINAL NEGATIVE REPORT Performed at [facility]    Report Status PENDING  Incomplete  Blood Culture (routine x 2)     Status: None (Preliminary result)   Collection Time: 04/12/14  7:24 PM  Result Value Ref Range Status   Specimen Description BLOOD RIGHT ANTECUBITAL  Final   Special Requests BOTTLES DRAWN AEROBIC AND ANAEROBIC 5CC  Final   Culture   Final           BLOOD CULTURE RECEIVED NO GROWTH TO DATE CULTURE WILL BE HELD FOR 5 DAYS BEFORE ISSUING A FINAL NEGATIVE REPORT Performed at [facility]    Report Status PENDING  Incomplete  Urine culture     Status: None   Collection Time: 04/12/14  9:17 PM  Result Value Ref Range Status   Specimen Description URINE, CLEAN CATCH  Final   Special Requests NONE  Final   Colony Count NO GROWTH Performed at [facility]   Final   Culture NO GROWTH Performed at [facility]  Final   Report Status 04/13/2014 FINAL  Final  Rapid strep screen     Status: None   Collection Time: 04/12/14  9:23 PM  Result Value Ref Range Status   Streptococcus, Group A Screen (Direct) NEGATIVE NEGATIVE Final    Comment: (NOTE) A Rapid Antigen test may result negative if the antigen level in the sample is below the detection level of this test. The FDA has not cleared this test as a stand-alone test therefore the rapid antigen negative result has reflexed to a Group A Strep culture.   Culture, Group A Strep     Status: None   Collection Time: 04/12/14  9:23 PM  Result Value Ref Range Status   Specimen Description THROAT  Final   Special Requests NONE  Final   Culture   Final    No Beta Hemolytic Streptococci Isolated Performed at [facility]    Report Status 04/14/2014 FINAL  Final  Respiratory virus panel (routine influenza)      Status: None   Collection Time: 04/13/14  2:14 AM  Result Value Ref Range Status   Source - [facility] NASAL WASHINGS  Corrected    Comment: CORRECTED ON 12/30 AT 1834: PREVIOUSLY REPORTED AS NASAL WASHINGS   Respiratory Syncytial Virus A NOT DETECTED  Final   Respiratory Syncytial Virus B NOT DETECTED  Final   Influenza A NOT DETECTED  Final   Influenza B NOT DETECTED  Final   Parainfluenza 1 NOT DETECTED  Final   Parainfluenza 2 NOT DETECTED  Final   Parainfluenza 3 NOT DETECTED  Final   Metapneumovirus NOT DETECTED  Final   Rhinovirus NOT DETECTED  Final   Adenovirus NOT DETECTED  Final   Influenza A H1 NOT DETECTED  Final   Influenza A H3 NOT DETECTED  Final    Comment: (NOTE)       Normal Reference Range for each Analyte: NOT DETECTED Testing performed using the [employer] xTAG Respiratory Viral Panel test kit. The analytical performance characteristics of this assay have been determined by [facility].  The modifications have not been cleared or approved by the FDA. This assay has been validated pursuant to the CLIA regulations and is used for clinical purposes. Performed at [facility]      Studies: No results found.  Scheduled Meds: . allopurinol  300 mg Oral Daily  . aspirin  325 mg Oral Daily  . clorazepate  7.5 mg Oral [id number]  . clotrimazole  1 application Topical BID  . diltiazem  120 mg Oral Daily  . docusate sodium  100 mg Oral BID  . feeding supplement (ENSURE COMPLETE)  237 mL Oral Q24H  . fluconazole  100 mg Oral Daily  . folic acid  1 mg Oral Daily  . insulin aspart  0-9 Units Subcutaneous TID WC  . levofloxacin  500 mg Oral Daily  . loratadine  10 mg Oral Daily  . metoprolol  25 mg Oral BID  . miconazole  1 application Topical BID  . multivitamin with minerals  1 tablet Oral Daily  . nystatin  5 mL Oral QID  . polyethylene glycol  17 g Oral Daily  . pseudoephedrine  120 mg Oral BID  . simvastatin  20 mg Oral Daily  . sodium chloride  3  mL Intravenous Q12H  . thiamine  100 mg Oral Daily   Continuous Infusions:    Principal Problem:   Febrile neutropenia Active Problems:   Hypertension  Diabetes mellitus without complication   DLBCL (diffuse large B cell lymphoma)   CKD (chronic kidney disease), stage III   Fever   Acute blood loss anemia   Pyrexia   Heme + stool   Decreased hemoglobin   Protein-calorie malnutrition, severe   Elevated troponin    Time spent: 56 minutes    , M.D. [facility] Pager [phone]. If 7PM-7AM, please contact night-coverage at [email or web address], password [facility] 04/17/2014, 4:34 PM  LOS: 5 days

## 2014-04-17 NOTE — Discharge Summary (Signed)
Physician Discharge Summary  Deckard Stuber DJT:[id number] DOB: [birth year] DOA: 04/12/2014  PCP: [name], MD  Admit date: 04/12/2014 Discharge date: 04/18/2014  Time spent: 65 minutes  Recommendations for Outpatient Follow-up:  1. Follow-up with CABEZA,YURI, MD in 1-2 weeks. On follow-up patient need a CBC and a basic metabolic profile done to follow-up on electrolytes and renal function. 2. Follow-up with Dr. Jana Hakim at of hematology/oncology on 04/25/2014 as scheduled.  Discharge Diagnoses:  Principal Problem:   Febrile neutropenia Active Problems:   Hypertension   Diabetes mellitus without complication   DLBCL (diffuse large B cell lymphoma)   CKD (chronic kidney disease), stage III   Fever   Acute blood loss anemia   Pyrexia   Heme + stool   Decreased hemoglobin   Protein-calorie malnutrition, severe   Elevated troponin   Discharge Condition: Stable and improved  Diet recommendation: Regular  Filed Weights   04/12/14 1859 04/13/14 0131  Weight: 73.483 kg (162 lb) 72.303 kg (159 lb 6.4 oz)    History of present illness:  81 year old male with a history of diffuse large B-cell lymphoma, diabetes mellitus, Paroxysmal atrial fibrillation, hypertension, obstructive sleep apnea presented with 3 day history of fever, myalgias, sore throat, and shortness of breath. He went to see his oncologist, Dr. Jana Hakim, on 04/12/2014. The patient was started on ciprofloxacin empirically. However, he continued to feel worse and continued to have fevers. Therefore, he presented to the emergency department. He was noted to have an absolute neutrophil count of 506, total WBC 2.3. Chest x-ray did not show any consolidation. Blood cultures were obtained and the patient was started on vancomycin and cefepime. Urinalysis was negative for pyuria.  Hospital Course:  1 neutropenic fever Unknown etiology. Chest x-ray was negative for any acute infiltrate. Urinalysis was negative. Blood cultures  with no growth to date. ANC has improved. Patient was placed empirically on IV vancomycin and IV cefepime. IV vancomycin and IV cefepime was subsequently transitioned to oral Levaquin and patient completed a 6 day course of antibiotic therapy. No further antibiotics needed on discharge.   #2 chest discomfort/elevated troponin Likely secondary to demand ischemia per cardiology, secondary to acute illness and anemia of 6.5. EKG abnormalities were felt to be likely secondary to severe anemia and  hypokalemia. Per cardiology no further ischemic workup indicated at this time. Per cardiology if exertional symptoms persist after correction of underlying anemia and febrile illness and electrolyte abnormalities may consider outpatient Lexiscan Myoview. 2-D echo was done and per cardiology, Dr. Acie Fredrickson, EF was normal with no significant change. 2-D echo was not being transferred into the Epic system at this time. Cardiology followed the patient throughout the hospitalization.   #3 acute blood loss anemia Patient's hemoglobin dropped to 6.5 from 9.2. Patient status post 2 units packed red blood cells. Patient was heme positive. Hemoglobin has improved currently at 11.4 and stable. No significant overt bleeding noted. May be secondary to intermittent NSAID use. Patient has been seen by gastroenterology and defer an endoscopy at this time. Follow H&H.  #4 hypertension Stable. Patient was maintained on a diltiazem and metoprolol.  #5 type 2 diabetes Hemoglobin A1c 6.2 on 04/13/2014. Patient was maintained on a sliding scale insulin.  #6 B-cell lymphoma Status post dose reduced CHOP/Rituxan started 01/10/2014. Last chemotherapy was 04/05/2014. Patient has been seen by hematology/ oncology and recommended outpatient follow-up.  #7 paroxysmal atrial fibrillation Currently normal sinus rhythm. Continued diltiazem and metoprolol for rate control. Patient deemed not anticoagulation candidate secondary to  thrombocytopenia.  Maintained on aspirin.  #8 hypokalemia Repleted.  Procedures:  Chest x-ray 04/12/2014  2 units packed red blood cells 04/13/2014    Consultations:  Cardiology Dr. Mare Ferrari 04/14/2014  Oncology Dr. Ron Agee 04/13/2014  Gastroenterology Dr. Carlean Purl 04/13/2014    Discharge Exam: Filed Vitals:   04/18/14 1326  BP: 123/79  Pulse: 87  Temp: 98 F (36.7 C)  Resp: 18    General: NAD Cardiovascular: RRR Respiratory: CTAB  Discharge Instructions   Discharge Instructions    Diet general    Complete by:  As directed      Discharge instructions    Complete by:  As directed   Follow up with DR Magrinat at scheduled 04/25/13     Increase activity slowly    Complete by:  As directed           Current Discharge Medication List    START taking these medications   Details  docusate sodium 100 MG CAPS Take 100 mg by mouth 2 (two) times daily. Qty: 10 capsule, Refills: 0      CONTINUE these medications which have CHANGED   Details  fluconazole (DIFLUCAN) 100 MG tablet Take 1 tablet (100 mg total) by mouth daily. Qty: 30 tablet, Refills: 0      CONTINUE these medications which have NOT CHANGED   Details  allopurinol (ZYLOPRIM) 300 MG tablet Take 1 tablet (300 mg total) by mouth daily. Qty: 90 tablet, Refills: 1   Associated Diagnoses: DLBCL (diffuse large B cell lymphoma)    aspirin 325 MG tablet Take 1 tablet (325 mg total) by mouth daily. Qty: 30 tablet, Refills: 0    clorazepate (TRANXENE) 7.5 MG tablet Take 1 tablet (7.5 mg total) by mouth 2 (two) times daily as needed for anxiety or sleep. Qty: 180 tablet, Refills: 0    clotrimazole-betamethasone (LOTRISONE) cream Apply 1 application topically 2 (two) times daily.    feeding supplement, ENSURE COMPLETE, (ENSURE COMPLETE) LIQD Take 237 mLs by mouth daily. Qty: 20 Bottle, Refills: 1    insulin aspart (NOVOLOG) 100 UNIT/ML injection Inject 0-20 Units into the skin 3 (three) times daily  with meals. Qty: 60 mL, Refills: 3   Associated Diagnoses: DLBCL (diffuse large B cell lymphoma)    lidocaine-prilocaine (EMLA) cream Apply 1 application topically as needed. APPLY TO PORT SITE 1 HOUR PRIOR TO THERAPY. DO NOT RUB IN. COVER WITH SARAN WRAP. Qty: 30 g, Refills: 2    loratadine (CLARITIN) 10 MG tablet Take 10 mg by mouth every morning.    metoprolol (LOPRESSOR) 50 MG tablet Take 25 mg by mouth 2 (two) times daily.      miconazole (MICATIN) 2 % cream Apply 1 application topically 2 (two) times daily. Apply to groin area twice daily until clear Qty: 45 g, Refills: 0    nystatin (MYCOSTATIN) 100000 UNIT/ML suspension SWISH AND SWALLOW 5 MLS BY MOUTH FOUR TIMES DAILY Qty: 240 mL, Refills: 3    polyethylene glycol (MIRALAX / GLYCOLAX) packet Take 17 g by mouth daily.    simvastatin (ZOCOR) 20 MG tablet Take 1 tablet (20 mg total) by mouth daily. Qty: 90 tablet, Refills: 3    diltiazem (CARDIZEM CD) 120 MG 24 hr capsule Take 1 capsule (120 mg total) by mouth daily. Qty: 90 capsule, Refills: 1   Associated Diagnoses: Atrial fibrillation with RVR    glucose blood test strip ONE TOUCH ULTRA .CHECK BLOOD SUGAR AC AND HS DAILY Qty: 480 each, Refills: 3    naproxen (NAPROSYN) 375  MG tablet Take 1 tablet (375 mg total) by mouth 3 (three) times daily with meals. Qty: 20 tablet, Refills: 0    ondansetron (ZOFRAN) 8 MG tablet Take 1 tablet (8 mg total) by mouth every 12 (twelve) hours. Qty: 20 tablet, Refills: 0   Associated Diagnoses: DLBCL (diffuse large B cell lymphoma); Acute respiratory failure with hypoxia    predniSONE (DELTASONE) 20 MG tablet Take 3 tablets (60 mg total) by mouth daily with breakfast. Qty: 15 tablet, Refills: 5    traMADol (ULTRAM) 50 MG tablet Take 50 mg by mouth every 6 (six) hours as needed for moderate pain.      STOP taking these medications     ciprofloxacin (CIPRO) 500 MG tablet        Allergies  Allergen Reactions  . Rituximab Other  (See Comments)    Developed wheezing & rigors when infusion rate increased to 100 mg/hr.  Tolerates rate of 50 mg/hr.  . Caffeine Other (See Comments)    Makes patient feel like he's going to die  . Morphine And Related Other (See Comments)    excitability  . Penicillins Rash   Follow-up Information    Follow up with CABEZA,YURI, MD. Schedule an appointment as soon as possible for a visit in 2 weeks.   Specialty:  Internal Medicine   Why:  f/u in 1-2 weeks   Contact information:   [address] [place] [address] [phone]       Follow up with [facility].   Why:  Home Health Physical Therapy, Occupational Therapy, Social Worker and Proofreader information:   [address] [place] [address] [phone]       Follow up with Chauncey Cruel, MD On 04/25/2014.   Specialty:  Oncology   Contact information:   [place] [address] [phone]        The results of significant diagnostics from this hospitalization (including imaging, microbiology, ancillary and laboratory) are listed below for reference.    Significant Diagnostic Studies: Dg Tibia/fibula Left  04/18/2014   CLINICAL DATA:  Left leg pain and weakness follow multiple falls at home recently.  EXAM: LEFT TIBIA AND FIBULA - 2 VIEW  COMPARISON:  None.  FINDINGS: Anterior tibial tubercle spur. Mild patellar spur formation. No fracture or dislocation. Medium and lateral meniscus calcification.  IMPRESSION: 1. No fracture or dislocation. 2. Chondrocalcinosis and mild degenerative changes.   Electronically Signed   By: Enrique Sack M.D.   On: 04/18/2014 15:16   Dg Chest Port 1 View  04/12/2014   CLINICAL DATA:  Fever, sore throat.  Cough.  History of lymphoma.  EXAM: PORTABLE CHEST - 1 VIEW  COMPARISON:  PET-CT 03/09/2014.  Radiograph 01/17/2014  FINDINGS: Right chest port remains in place, tip in the mid SVC. Diminished left pleural effusion with  minimal residual blunting of left costophrenic angle. No airspace consolidation to suggest pneumonia. The cardiomediastinal contours are unchanged. There is no pneumothorax.  IMPRESSION: Diminished left pleural effusion with minimal residual blunting of the costophrenic angle. No acute pulmonary process.   Electronically Signed   By: Jeb Levering M.D.   On: 04/12/2014 19:15   Dg Hip Unilat With Pelvis 2-3 Views Left  04/18/2014   CLINICAL DATA:  Several falls outcome recently. LEFT leg weakness. LEFT hip pain radiating down the LEFT leg. Initial encounter.  EXAM: RADIOLOGY EXAMINATION  COMPARISON:  None.  FINDINGS: AP view of the  pelvis and AP and lateral views of the LEFT hip are submitted for interpretation. LEFT hip is located. Negative for fracture. Pelvic rings appear intact. Sacral arcades appear normal.  IMPRESSION: No acute osseous abnormality.   Electronically Signed   By: Dereck Ligas M.D.   On: 04/18/2014 15:23   Dg Femur Min 2 Views Left  04/18/2014   CLINICAL DATA:  Several falls at home recently.  LEFT leg weakness.  EXAM: RADIOLOGY EXAMINATION  COMPARISON:  None.  FINDINGS: Atherosclerosis is present. There is no fracture or acute osseous abnormality. AP and lateral views of the femur submitted for interpretation. The visualized portions of the knee demonstrate chondrocalcinosis. No aggressive osseous lesions.  IMPRESSION: No acute injury to the LEFT femur. Chondrocalcinosis of the knee, probably senile degenerative in this age group.   Electronically Signed   By: Dereck Ligas M.D.   On: 04/18/2014 15:14    Microbiology: Recent Results (from the past 240 hour(s))  Blood Culture (routine x 2)     Status: None (Preliminary result)   Collection Time: 04/12/14  6:34 PM  Result Value Ref Range Status   Specimen Description BLOOD PAC  Final   Special Requests BOTTLES DRAWN AEROBIC AND ANAEROBIC 5CC  Final   Culture   Final           BLOOD CULTURE RECEIVED NO GROWTH TO DATE CULTURE  WILL BE HELD FOR 5 DAYS BEFORE ISSUING A FINAL NEGATIVE REPORT Performed at [facility]    Report Status PENDING  Incomplete  Blood Culture (routine x 2)     Status: None (Preliminary result)   Collection Time: 04/12/14  7:24 PM  Result Value Ref Range Status   Specimen Description BLOOD RIGHT ANTECUBITAL  Final   Special Requests BOTTLES DRAWN AEROBIC AND ANAEROBIC 5CC  Final   Culture   Final           BLOOD CULTURE RECEIVED NO GROWTH TO DATE CULTURE WILL BE HELD FOR 5 DAYS BEFORE ISSUING A FINAL NEGATIVE REPORT Performed at [facility]    Report Status PENDING  Incomplete  Urine culture     Status: None   Collection Time: 04/12/14  9:17 PM  Result Value Ref Range Status   Specimen Description URINE, CLEAN CATCH  Final   Special Requests NONE  Final   Colony Count NO GROWTH Performed at [facility]   Final   Culture NO GROWTH Performed at [facility]   Final   Report Status 04/13/2014 FINAL  Final  Rapid strep screen     Status: None   Collection Time: 04/12/14  9:23 PM  Result Value Ref Range Status   Streptococcus, Group A Screen (Direct) NEGATIVE NEGATIVE Final    Comment: (NOTE) A Rapid Antigen test may result negative if the antigen level in the sample is below the detection level of this test. The FDA has not cleared this test as a stand-alone test therefore the rapid antigen negative result has reflexed to a Group A Strep culture.   Culture, Group A Strep     Status: None   Collection Time: 04/12/14  9:23 PM  Result Value Ref Range Status   Specimen Description THROAT  Final   Special Requests NONE  Final   Culture   Final    No Beta Hemolytic Streptococci Isolated Performed at [facility]    Report Status 04/14/2014 FINAL  Final  Respiratory virus panel (routine influenza)     Status:  None   Collection Time: 04/13/14  2:14 AM  Result Value Ref Range Status   Source - [facility]VPAN NASAL WASHINGS  Corrected    Comment:  CORRECTED ON 12/30 AT 1834: PREVIOUSLY REPORTED AS NASAL WASHINGS   Respiratory Syncytial Virus A NOT DETECTED  Final   Respiratory Syncytial Virus B NOT DETECTED  Final   Influenza A NOT DETECTED  Final   Influenza B NOT DETECTED  Final   Parainfluenza 1 NOT DETECTED  Final   Parainfluenza 2 NOT DETECTED  Final   Parainfluenza 3 NOT DETECTED  Final   Metapneumovirus NOT DETECTED  Final   Rhinovirus NOT DETECTED  Final   Adenovirus NOT DETECTED  Final   Influenza A H1 NOT DETECTED  Final   Influenza A H3 NOT DETECTED  Final    Comment: (NOTE)       Normal Reference Range for each Analyte: NOT DETECTED Testing performed using the Luminex xTAG Respiratory Viral Panel test kit. The analytical performance characteristics of this assay have been determined by [facility].  The modifications have not been cleared or approved by the FDA. This assay has been validated pursuant to the CLIA regulations and is used for clinical purposes. Performed at [facility]: Basic Metabolic Panel:  Recent Labs Lab 04/13/14 1105 04/14/14 0500 04/15/14 0545 04/16/14 0500 04/18/14 0416  NA 138 138 132* 136 134*  K 3.4* 3.1* 3.8 4.2 4.4  CL 101 103 100 98 96  CO2 31 31 28 31 29   GLUCOSE 168* 125* 114* 109* 143*  BUN 11 14 13 13 12   CREATININE 0.66 0.61 0.68 0.68 0.71  CALCIUM 7.9* 7.5* 7.8* 8.1* 8.3*  MG  --   --  1.8  --  2.0   Liver Function Tests:  Recent Labs Lab 04/12/14 1834 04/13/14 1105 04/18/14 0416  AST 23 23 29   ALT 21 19 14   ALKPHOS 75 65 73  BILITOT 0.3 0.7 0.4  PROT 5.0* 4.9* 5.3*  ALBUMIN 2.6* 2.4* 2.4*   No results for input(s): LIPASE, AMYLASE in the last 168 hours. No results for input(s): AMMONIA in the last 168 hours. CBC:  Recent Labs Lab 04/14/14 0500 04/15/14 0545 04/16/14 0500 04/17/14 0446 04/18/14 0416  WBC 2.8* 4.4 6.1 5.8 6.6  NEUTROABS 1.2* 2.3 3.5 3.5 4.5  HGB 10.7* 11.4* 11.6* 11.7* 11.9*  HCT 32.7* 35.4* 36.7*  36.4* 36.6*  MCV 84.5 84.7 85.3 84.8 84.5  PLT 95* 120* 127* 160 168   Cardiac Enzymes:  Recent Labs Lab 04/13/14 1105 04/13/14 1715 04/13/14 2306  TROPONINI 0.12* 0.12* 0.11*   BNP: BNP (last 3 results)  Recent Labs  01/05/14 2142  PROBNP 343.7   CBG:  Recent Labs Lab 04/17/14 1630 04/17/14 2153 04/18/14 0740 04/18/14 1134 04/18/14 1701  GLUCAP 122* 151* 106* 158* 142*       Signed:  Kwamane Whack MD [facility] 04/18/2014, 5:31 PM

## 2014-04-18 ENCOUNTER — Other Ambulatory Visit: Payer: Medicare Other

## 2014-04-18 ENCOUNTER — Inpatient Hospital Stay (HOSPITAL_COMMUNITY): Payer: Medicare Other

## 2014-04-18 ENCOUNTER — Other Ambulatory Visit: Payer: Self-pay | Admitting: Oncology

## 2014-04-18 DIAGNOSIS — N183 Chronic kidney disease, stage 3 (moderate): Secondary | ICD-10-CM

## 2014-04-18 LAB — CBC WITH DIFFERENTIAL/PLATELET
BASOS PCT: 1 % (ref 0–1)
Basophils Absolute: 0.1 10*3/uL (ref 0.0–0.1)
Eosinophils Absolute: 0.1 10*3/uL (ref 0.0–0.7)
Eosinophils Relative: 1 % (ref 0–5)
HCT: 36.6 % — ABNORMAL LOW (ref 39.0–52.0)
HEMOGLOBIN: 11.9 g/dL — AB (ref 13.0–17.0)
Lymphocytes Relative: 19 % (ref 12–46)
Lymphs Abs: 1.3 10*3/uL (ref 0.7–4.0)
MCH: 27.5 pg (ref 26.0–34.0)
MCHC: 32.5 g/dL (ref 30.0–36.0)
MCV: 84.5 fL (ref 78.0–100.0)
Monocytes Absolute: 0.7 10*3/uL (ref 0.1–1.0)
Monocytes Relative: 11 % (ref 3–12)
NEUTROS PCT: 68 % (ref 43–77)
Neutro Abs: 4.5 10*3/uL (ref 1.7–7.7)
Platelets: 168 10*3/uL (ref 150–400)
RBC: 4.33 MIL/uL (ref 4.22–5.81)
RDW: 16.3 % — AB (ref 11.5–15.5)
WBC: 6.6 10*3/uL (ref 4.0–10.5)

## 2014-04-18 LAB — COMPREHENSIVE METABOLIC PANEL
ALBUMIN: 2.4 g/dL — AB (ref 3.5–5.2)
ALK PHOS: 73 U/L (ref 39–117)
ALT: 14 U/L (ref 0–53)
AST: 29 U/L (ref 0–37)
Anion gap: 9 (ref 5–15)
BILIRUBIN TOTAL: 0.4 mg/dL (ref 0.3–1.2)
BUN: 12 mg/dL (ref 6–23)
CHLORIDE: 96 meq/L (ref 96–112)
CO2: 29 mmol/L (ref 19–32)
Calcium: 8.3 mg/dL — ABNORMAL LOW (ref 8.4–10.5)
Creatinine, Ser: 0.71 mg/dL (ref 0.50–1.35)
GFR calc Af Amer: >90 mL/min (ref >90)
GFR calc non Af Amer: 83 mL/min — ABNORMAL LOW (ref >90)
Glucose, Bld: 143 mg/dL — ABNORMAL HIGH (ref 70–99)
POTASSIUM: 4.4 mmol/L (ref 3.5–5.1)
SODIUM: 134 mmol/L — AB (ref 135–145)
Total Protein: 5.3 g/dL — ABNORMAL LOW (ref 6.0–8.3)

## 2014-04-18 LAB — GLUCOSE, CAPILLARY
Glucose-Capillary: 106 mg/dL — ABNORMAL HIGH (ref 70–99)
Glucose-Capillary: 158 mg/dL — ABNORMAL HIGH (ref 70–99)
Glucose-Capillary: 142 mg/dL — ABNORMAL HIGH (ref 70–99)

## 2014-04-18 LAB — MAGNESIUM: MAGNESIUM: 2 mg/dL (ref 1.5–2.5)

## 2014-04-18 LAB — URIC ACID: URIC ACID, SERUM: 1.9 mg/dL — AB (ref 4.0–7.8)

## 2014-04-18 LAB — LACTATE DEHYDROGENASE: LDH: 334 U/L — AB (ref 94–250)

## 2014-04-18 MED ORDER — HEPARIN SOD (PORK) LOCK FLUSH 100 UNIT/ML IV SOLN
500.0000 [IU] | INTRAVENOUS | Status: AC | PRN
  Administered 2014-04-18: 500 [IU]

## 2014-04-18 NOTE — Care Management Note (Signed)
    Page 1 of 1   04/18/2014     4:02:08 PM CARE MANAGEMENT NOTE 04/18/2014  Patient:  Erik Ramirez,Erik Ramirez   Account Number:  [id number]  Date Initiated:  04/17/2014  Documentation initiated by:  [facility]  Subjective/Objective Assessment:     Action/Plan:   llives at home with wife   Anticipated DC Date:  04/18/2014   Anticipated DC Plan:  [place]  CM consult      [facility] Care Choice  HOME HEALTH   Choice offered to / List presented to:  C-1 Patient        [place] arranged  [place].   Status of service:  Completed, signed off Medicare Important Message given?  YES (If response is "NO", the following Medicare IM given date fields will be blank) Date Medicare IM given:  04/17/2014 Medicare IM given by:  [facility] Date Additional Medicare IM given:   Additional Medicare IM given by:    Discharge Disposition:  [place]  Per UR Regulation:  Reviewed for med. necessity/level of care/duration of stay  If discussed at [place] of Stay Meetings, dates discussed:    Comments:  04/18/14 Dessa Phi RN BSN NCM [phone] [facility] already aware of [place] orders, & d/c.  04/17/2014 1745 NCM spoke to pt and offered choice for [facility]. Pt requested [facility] for HH. Referral sent to [facility] for [facility]. Pt states he has RW at home. Jonnie Finner RN CCM Case Mgmt phone [phone]

## 2014-04-18 NOTE — Evaluation (Signed)
Occupational Therapy Evaluation Patient Details Name: Xavious Sharrar MRN: [id number] DOB: [birth year] Today's Date: 04/18/2014    History of Present Illness 81 year old male with a history of diffuse large B-cell lymphoma, diabetes mellitus,  Paroxysmal atrial fibrillation, hypertension, obstructive sleep apnea admitted for neutropenic fever.   Clinical Impression   Pt up to the bathroom with min guard/min assist with transfers/ADL. He is alittle unsteady and would benefit from walker use which pt is agreeable to as recommended by PT today. He states his son can help him initially at home as well as his wife. Discussed safety strategies of not getting into the tub right away and that he could benefit from sponge bathing initially. Pt agreeable to this as well as a 3in1 to make transitions on and off toilet safer. Will follow on acute to progress ADL safety and independence.     Follow Up Recommendations  Home health OT;Supervision/Assistance - 24 hour    Equipment Recommendations  3 in 1 bedside comode    Recommendations for Other Services       Precautions / Restrictions Precautions Precautions: Fall      Mobility                 Transfers Overall transfer level: Needs assistance Equipment used: None Transfers: Sit to/from Stand Sit to Stand: Min guard;Min assist         General transfer comment: min guard from recliner. min from toilet with bar.    Balance Overall balance assessment: History of Falls                                          ADL Overall ADL's : Needs assistance/impaired Eating/Feeding: Independent;Sitting   Grooming: Wash/dry hands;Standing;Min guard   Upper Body Bathing: Set up;Sitting   Lower Body Bathing: Minimal assistance;Min guard;Sit to/from stand   Upper Body Dressing : Set up;Sitting   Lower Body Dressing: Minimal assistance;Min guard;Sit to/from stand   Toilet Transfer: Minimal assistance;Min  guard;Ambulation Toilet Transfer Details (indicate cue type and reason): pushing iv pole Toileting- Clothing Manipulation and Hygiene: Minimal assistance;Min guard;Sit to/from stand         General ADL Comments: Pt with some unsteadiness noted during functional transfers to the bathroom, on and off the toilet. Pt having to pull up heavily with grab bar to stand from toilet so discussed use of a 3in1 and pt is agreeable to get one. Pt able to don/doff socks seated. Pt reaching for door frame and places to hold as he mobilizes in and out of the bathroom so agree with PT that pt would benefit from walker use when up. Discussed safety of sponge bathing initially and not getting into a tub until he feels stronger. He reports having some near falls/falls at home.     Vision                     Perception     Praxis      Pertinent Vitals/Pain Pain Assessment: No/denies pain     Hand Dominance     Extremity/Trunk Assessment Upper Extremity Assessment Upper Extremity Assessment: Overall WFL for tasks assessed          Communication Communication Communication: No difficulties   Cognition Arousal/Alertness: Awake/alert Behavior During Therapy: WFL for tasks assessed/performed Overall Cognitive Status: Within Functional Limits for tasks assessed  General Comments       Exercises       Shoulder Instructions      Home Living Family/patient expects to be discharged to:: Private residence Living Arrangements: Spouse/significant other Available Help at Discharge: Family;Available 24 hours/day Type of Home: House Home Access: Level entry     Home Layout: Laundry or work area in basement     [employer] Shower/Tub: Tub/shower unit;Walk-in shower   Bathroom Toilet: Standard     Home Equipment: Environmental consultant - 2 wheels;Cane - single point;Grab bars - tub/shower          Prior Functioning/Environment Level of Independence: Independent              OT Diagnosis: Generalized weakness   OT Problem List: Decreased strength;Decreased knowledge of use of DME or AE;Impaired balance (sitting and/or standing)   OT Treatment/Interventions: Self-care/ADL training;Patient/family education;Therapeutic activities;DME and/or AE instruction    OT Goals(Current goals can be found in the care plan section) Acute Rehab OT Goals Patient Stated Goal: get more strength OT Goal Formulation: With patient Time For Goal Achievement: 05/02/14 Potential to Achieve Goals: Good  OT Frequency: Min 2X/week   Barriers to D/C:            Co-evaluation              End of Session Equipment Utilized During Treatment: Gait belt  Activity Tolerance: Patient tolerated treatment well Patient left: in chair;with call bell/phone within reach;with chair alarm set   Time: 1135-1205 OT Time Calculation (min): 30 min Charges:  OT General Charges $OT Visit: 1 Procedure OT Evaluation $Initial OT Evaluation Tier I: 1 Procedure OT Treatments $Self Care/Home Management : 8-22 mins $Therapeutic Activity: 8-22 mins G-Codes:    Jules Schick  [phone] 04/18/2014, 12:14 PM

## 2014-04-18 NOTE — Evaluation (Signed)
Physical Therapy Evaluation Patient Details Name: Erik Ramirez MRN: [id number] DOB: 1925-12-01 Today's Date: 04/18/2014   History of Present Illness  81 year old male with a history of diffuse large B-cell lymphoma, diabetes mellitus,  Paroxysmal atrial fibrillation, hypertension, obstructive sleep apnea admitted for neutropenic fever.  Clinical Impression  Pt admitted with above diagnosis. Pt currently with functional limitations due to the deficits listed below (see PT Problem List). Pt ambulated in hallway with unsteady gait however no true LOB, pt also declined assistive device however strongly encouraged pt use RW upon d/c for safety and decrease falls. Pt reports falls at home however states "not true falls" to floor however were described as unplanned descents toward floor. Pt will benefit from skilled PT to increase their independence and safety with mobility to allow discharge to the venue listed below.  Pt would benefit from HHPT to improve strength and balance training.     Follow Up Recommendations Home health PT;Supervision for mobility/OOB    Equipment Recommendations  None recommended by PT    Recommendations for Other Services       Precautions / Restrictions Precautions Precautions: Fall      Mobility  Bed Mobility Overal bed mobility: Modified Independent                Transfers Overall transfer level: Needs assistance Equipment used: None Transfers: Sit to/from Stand Sit to Stand: Min guard         General transfer comment: verbal cues for self assist with UEs  Ambulation/Gait Ambulation/Gait assistance: Min guard Ambulation Distance (Feet): 320 Feet Assistive device:  (IV pole) Gait Pattern/deviations: Step-through pattern;Decreased stride length     General Gait Details: pt pushed IV pole, unsteady throughout gait however no true LOB observed, occasional instances of L knee buckling during gait, pt declined using assistive device today  however recommended using RW at home for safety   Stairs            Wheelchair Mobility    Modified Rankin (Stroke Patients Only)       Balance Overall balance assessment: History of Falls                                           Pertinent Vitals/Pain Pain Assessment: No/denies pain    Home Living Family/patient expects to be discharged to:: Private residence Living Arrangements: Spouse/significant other Available Help at Discharge: Family;Available 24 hours/day Type of Home: House Home Access: Level entry     Home Layout: Laundry or work area in [place]: Environmental consultant - 2 wheels;Cane - single point      Prior Function Level of Independence: Independent               Hand Dominance        Extremity/Trunk Assessment               Lower Extremity Assessment: Generalized weakness;LLE deficits/detail   LLE Deficits / Details: grossly 3/5 throughout, pt unable to hold resistance, reports L LE has recently been buckling causing "almost falls" at home     Communication   Communication: No difficulties  Cognition Arousal/Alertness: Awake/alert Behavior During Therapy: WFL for tasks assessed/performed Overall Cognitive Status: Within Functional Limits for tasks assessed                      General Comments  Exercises        Assessment/Plan    PT Assessment Patient needs continued PT services  PT Diagnosis Difficulty walking   PT Problem List Decreased strength;Decreased balance;Decreased mobility;Decreased knowledge of use of DME;Decreased safety awareness  PT Treatment Interventions DME instruction;Gait training;Functional mobility training;Therapeutic activities;Therapeutic exercise;Patient/family education;Neuromuscular re-education;Balance training   PT Goals (Current goals can be found in the Care Plan section) Acute Rehab PT Goals PT Goal Formulation: With patient Time For Goal Achievement:  05/02/14 Potential to Achieve Goals: Good    Frequency Min 3X/week   Barriers to discharge        Co-evaluation               End of Session   Activity Tolerance: Patient tolerated treatment well Patient left: in chair;with call bell/phone within reach Nurse Communication: Mobility status (pt up in recliner)         Time: 5638-9373 PT Time Calculation (min) (ACUTE ONLY): 13 min   Charges:   PT Evaluation $Initial PT Evaluation Tier I: 1 Procedure PT Treatments $Gait Training: 8-22 mins   PT G Codes:        Jennae Hakeem,KATHrine E 04/18/2014, 11:49 AM Carmelia Bake, PT, DPT 04/18/2014 Pager: [phone]

## 2014-04-18 NOTE — Progress Notes (Signed)
Subjective:  No chest pain or dyspnea  Objective:   Vital Signs in the last 24 hours: Temp:  [97.6 F (36.4 C)] 97.6 F (36.4 C) (01/04 0506) Pulse Rate:  [76-80] 79 (01/04 0506) Resp:  [18] 18 (01/04 0506) BP: (119-148)/(69-76) 119/69 mmHg (01/04 0506) SpO2:  [98 %] 98 % (01/04 0506)  Intake/Output from previous day: 01/03 0701 - 01/04 0700 In: 315 [P.O.:280] Out: 2000 [Urine:2000] Net: -6767  I/O since admission: -6395  Medications: . allopurinol  300 mg Oral Daily  . aspirin  325 mg Oral Daily  . clorazepate  7.5 mg Oral BH-[id number]  . clotrimazole  1 application Topical BID  . diltiazem  120 mg Oral Daily  . docusate sodium  100 mg Oral BID  . feeding supplement (ENSURE COMPLETE)  237 mL Oral Q24H  . fluconazole  100 mg Oral Daily  . folic acid  1 mg Oral Daily  . insulin aspart  0-9 Units Subcutaneous TID WC  . levofloxacin  500 mg Oral Daily  . loratadine  10 mg Oral Daily  . metoprolol  25 mg Oral BID  . miconazole  1 application Topical BID  . multivitamin with minerals  1 tablet Oral Daily  . nystatin  5 mL Oral QID  . polyethylene glycol  17 g Oral Daily  . pseudoephedrine  120 mg Oral BID  . simvastatin  20 mg Oral Daily  . sodium chloride  3 mL Intravenous Q12H  . thiamine  100 mg Oral Daily       Physical Exam:   General appearance: alert, cooperative and no distress Neck: no carotid bruit, no JVD, supple, symmetrical, trachea midline and thyroid not enlarged, symmetric, no tenderness/mass/nodules Lungs: clear to auscultation bilaterally Heart: regular rate and rhythm Abdomen: soft, non-tender; bowel sounds normal; no masses,  no organomegaly Extremities: no edema, redness or tenderness in the calves or thighs Neurologic: Grossly normal   Rate:80  Rhythm: normal sinus rhythm  04/15/2014 ECG (independently read by me today): NSR with Anterior Q waves suggestive of old MI and T wave changes   Will repeat ECG today.  Lab Results:  BMP Latest  Ref Rng 04/16/2014 04/15/2014 04/14/2014  Glucose 70 - 99 mg/dL 109(H) 114(H) 125(H)  BUN 6 - 23 mg/dL 13 13 14   Creatinine 0.50 - 1.35 mg/dL 0.68 0.68 0.61  Sodium 135 - 145 mmol/L 136 132(L) 138  Potassium 3.5 - 5.1 mmol/L 4.2 3.8 3.1(L)  Chloride 96 - 112 mEq/L 98 100 103  CO2 19 - 32 mmol/L 31 28 31   Calcium 8.4 - 10.5 mg/dL 8.1(L) 7.8(L) 7.5(L)     CBC Latest Ref Rng 04/18/2014 04/17/2014 04/16/2014  WBC 4.0 - 10.5 K/uL 6.6 5.8 6.1  Hemoglobin 13.0 - 17.0 g/dL 11.9(L) 11.7(L) 11.6(L)  Hematocrit 39.0 - 52.0 % 36.6(L) 36.4(L) 36.7(L)  Platelets 150 - 400 K/uL 168 160 127(L)     No results for input(s): TROPONINI in the last 72 hours.  Invalid input(s): CK, MB  Hepatic Function Panel No results for input(s): PROT, ALBUMIN, AST, ALT, ALKPHOS, BILITOT, BILIDIR, IBILI in the last 72 hours. No results for input(s): INR in the last 72 hours. BNP (last 3 results)  Recent Labs  01/05/14 2142  PROBNP 343.7    Lipid Panel  No results found for: CHOL, TRIG, HDL, CHOLHDL, VLDL, LDLCALC, LDLDIRECT    Imaging:   01/06/2014 EchoStudy Conclusions  - Left ventricle: The cavity size was normal. Systolic function was normal. The estimated  ejection fraction was in the range of 55% to 60%. Wall motion was normal; there were no regional wall motion abnormalities. There was an increased relative contribution of atrial contraction to ventricular filling. Doppler parameters are consistent with abnormal left ventricular relaxation (grade 1 diastolic dysfunction). - Ventricular septum: Septal motion showed paradox. - Aortic valve: Trileaflet; normal thickness, mildly calcified leaflets. There was trivial regurgitation. - Tricuspid valve: There was mild regurgitation. - Pulmonic valve: There was trivial regurgitation.  Echo ordered for this weekend results not yet available.    Assessment/Plan:   Principal Problem:   Febrile neutropenia Active Problems:   Hypertension    Diabetes mellitus without complication   DLBCL (diffuse large B cell lymphoma)   CKD (chronic kidney disease), stage III   Fever   Acute blood loss anemia   Pyrexia   Heme + stool   Decreased hemoglobin   Protein-calorie malnutrition, severe   Elevated troponin   Elevated Troponin ? Demand ischemia in setting of anemia/acute illness.  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 With T changes from 04/15/14 will repeat ECG today; f/u echo today to see if changed from  12/2013 study.  Anemia stable; platelet count is improved. Rhythm is stable.    Troy Sine, MD, [facility] 04/18/2014, 7:54 AM

## 2014-04-19 LAB — CULTURE, BLOOD (ROUTINE X 2)
CULTURE: NO GROWTH
CULTURE: NO GROWTH

## 2014-04-20 ENCOUNTER — Telehealth: Payer: Self-pay | Admitting: *Deleted

## 2014-04-20 NOTE — Telephone Encounter (Signed)
Message left by pt's son/caregiver- [name], stating noting ongoing weakness in father since discharge and increased difficulty swallowing.  This RN returned call to [phone] and obtained identified VM- message left to return call to this RN or if he feels more urgent concern if this RN not available to request to speak to a triage nurse.

## 2014-04-20 NOTE — Telephone Encounter (Signed)
This RN was able to speak with Erik Ramirez regarding his concerns with his father post discharge.  Erik Ramirez states overall " Dad is just getting weaker since he came home "  " he has no strength from his waist down - and has fallen 4 times "  "when he swallows he has to really work to get food all the way down almost like it gets blocked "  Erik Ramirez denies any coughing post eating or drinking per concern for aspiration.  Pt is not incontinent of urination - " but mom has to help him do about everything ".  At present Erik Ramirez states home health is not in the home " but they are working on getting physical therapy here "  Overall Erik Ramirez is concerned due to continue decline and pt not scheduled to be seen until 04/25/2014.  This note will be given to Dr Jannifer Rodney for review and recommendation.

## 2014-04-22 ENCOUNTER — Telehealth: Payer: Self-pay | Admitting: *Deleted

## 2014-04-22 ENCOUNTER — Other Ambulatory Visit: Payer: Self-pay | Admitting: *Deleted

## 2014-04-22 MED ORDER — SUCRALFATE 1 GM/10ML PO SUSP
1.0000 g | Freq: Three times a day (TID) | ORAL | Status: DC
Start: 2014-04-22 — End: 2014-05-10

## 2014-04-22 NOTE — Telephone Encounter (Signed)
This RN spoke with pt's son - Erik Ramirez- per his concerns with father today.  Erik Ramirez states he came over around noon to see parents-   " dad said when he work up his note was kinda stopped up but then so was his throat " " that scared him so he put some vicks vapor rub on his chest and under his nose and that really didn't help- so then he put some vicks in his throat and that seemed to help "  " he has a rash on his lower legs like when he was first diagnosed "  " just from the heel up 2/3's if his calves - sorta comes and goes "  Erik Ramirez states his father is still having swallowing difficulty " with feeling like food is getting stuck in his throat "  Other wise pt " seems to be able to get around a little better today"  This note will be given to MD for review and recommendation.

## 2014-04-22 NOTE — Telephone Encounter (Signed)
Per review with MD and recommendations this RN contacted pt's soneLanny Ramirez.  Informed Erik Hurst MD is concerned related to changes and inquired if Erik Ramirez could bring his father in to the office for lab and assessment by symptom management nurse.  Erik Ramirez states presently physical therapy is working with his father and he feels there is nothing so urgent that he needs to bring his father in presently.  This RN verified pt has not had fevers and he is continuing antibiotics per discharge- which Erik Ramirez verified.  Discussed concern with throat and swallowing as well as concern of use of Vick's vapor rub internally due to choking concern.  Per above use - improvement noted post " swallowing a small amount "  This RN informed Erik Ramirez a medication for swallowing concerns is being sent to his dad's pharmacy that should give benefit and not cause further problems that could incur with use of Vick's vapor rub being swallowed.  Erik Ramirez verbalized appreciation of help as well as understanding of plan. Erik Ramirez states he understands concern for dad's condition and understanding to call over the weekend if needed.  At present pt is scheduled for follow up with MD on 04/25/2014.

## 2014-04-25 ENCOUNTER — Ambulatory Visit: Payer: Medicare Other | Admitting: Oncology

## 2014-04-25 ENCOUNTER — Other Ambulatory Visit: Payer: Self-pay | Admitting: *Deleted

## 2014-04-25 ENCOUNTER — Encounter: Payer: Medicare Other | Admitting: Oncology

## 2014-04-25 ENCOUNTER — Other Ambulatory Visit: Payer: Medicare Other

## 2014-04-25 ENCOUNTER — Encounter: Payer: Self-pay | Admitting: Nurse Practitioner

## 2014-04-25 ENCOUNTER — Ambulatory Visit (HOSPITAL_BASED_OUTPATIENT_CLINIC_OR_DEPARTMENT_OTHER): Payer: Medicare Other | Admitting: Nurse Practitioner

## 2014-04-25 ENCOUNTER — Ambulatory Visit: Payer: Medicare Other

## 2014-04-25 ENCOUNTER — Other Ambulatory Visit (HOSPITAL_BASED_OUTPATIENT_CLINIC_OR_DEPARTMENT_OTHER): Payer: Medicare Other

## 2014-04-25 VITALS — BP 126/70 | HR 100 | Temp 97.4°F | Resp 18

## 2014-04-25 DIAGNOSIS — C8590 Non-Hodgkin lymphoma, unspecified, unspecified site: Secondary | ICD-10-CM

## 2014-04-25 DIAGNOSIS — C8330 Diffuse large B-cell lymphoma, unspecified site: Secondary | ICD-10-CM

## 2014-04-25 DIAGNOSIS — R531 Weakness: Secondary | ICD-10-CM

## 2014-04-25 LAB — COMPREHENSIVE METABOLIC PANEL (CC13)
ALT: 15 U/L (ref 0–55)
ANION GAP: 11 meq/L (ref 3–11)
AST: 24 U/L (ref 5–34)
Albumin: 2.9 g/dL — ABNORMAL LOW (ref 3.5–5.0)
Alkaline Phosphatase: 86 U/L (ref 40–150)
BUN: 22.8 mg/dL (ref 7.0–26.0)
CO2: 29 meq/L (ref 22–29)
Calcium: 9.8 mg/dL (ref 8.4–10.4)
Chloride: 93 mEq/L — ABNORMAL LOW (ref 98–109)
Creatinine: 0.8 mg/dL (ref 0.7–1.3)
EGFR: 80 mL/min/{1.73_m2} — ABNORMAL LOW (ref >90)
GLUCOSE: 154 mg/dL — AB (ref 70–140)
POTASSIUM: 3.7 meq/L (ref 3.5–5.1)
Sodium: 133 mEq/L — ABNORMAL LOW (ref 136–145)
Total Bilirubin: 0.5 mg/dL (ref 0.20–1.20)
Total Protein: 6.5 g/dL (ref 6.4–8.3)

## 2014-04-25 LAB — CBC WITH DIFFERENTIAL/PLATELET
BASO%: 1.3 % (ref 0.0–2.0)
Basophils Absolute: 0.2 10*3/uL — ABNORMAL HIGH (ref 0.0–0.1)
EOS%: 0.1 % (ref 0.0–7.0)
Eosinophils Absolute: 0 10*3/uL (ref 0.0–0.5)
HCT: 39.7 % (ref 38.4–49.9)
HGB: 12.7 g/dL — ABNORMAL LOW (ref 13.0–17.1)
LYMPH%: 11.9 % — ABNORMAL LOW (ref 14.0–49.0)
MCH: 26.9 pg — AB (ref 27.2–33.4)
MCHC: 32 g/dL (ref 32.0–36.0)
MCV: 83.8 fL (ref 79.3–98.0)
MONO#: 1.6 10*3/uL — ABNORMAL HIGH (ref 0.1–0.9)
MONO%: 10 % (ref 0.0–14.0)
NEUT#: 12.1 10*3/uL — ABNORMAL HIGH (ref 1.5–6.5)
NEUT%: 76.7 % — ABNORMAL HIGH (ref 39.0–75.0)
Platelets: 318 10*3/uL (ref 140–400)
RBC: 4.74 10*6/uL (ref 4.20–5.82)
RDW: 18.2 % — ABNORMAL HIGH (ref 11.0–14.6)
WBC: 15.8 10*3/uL — AB (ref 4.0–10.3)
lymph#: 1.9 10*3/uL (ref 0.9–3.3)

## 2014-04-25 LAB — LACTATE DEHYDROGENASE (CC13): LDH: 297 U/L — ABNORMAL HIGH (ref 125–245)

## 2014-04-25 LAB — URIC ACID (CC13): Uric Acid, Serum: 2.2 mg/dl — ABNORMAL LOW (ref 2.6–7.4)

## 2014-04-25 MED ORDER — PREDNISONE 10 MG PO TABS: 10.0000 mg | ORAL_TABLET | Freq: Every day | ORAL | Status: DC

## 2014-04-25 NOTE — Progress Notes (Signed)
[facility]  [phone] [phone]     ID: Zachariah Pavek DOB: 1925/10/19  MR#: [id number]  IPJ#:[id number]  Patient Care Team: Valaria Good. [name], MD as PCP - General (Internal Medicine) OTHER MD:  CHIEF COMPLAINT: large cell diffuse B-cell non-Hodgkin's lymphoma CURRENT TREATMENT: dose reduced CHOP/ rituximab   HISTORY OF PRESENT ILLNESS: From the original intake note:  "Mr Macmullen developed abdominal pain and distension sometime mid August 2015. He thought this was diverticular disease. Eventually he brought these concerns to Dr [name] and per patient's report and chart review a CT of the abd/pelvis was obtained which showed significant adenopathy. Biopsy 12/13/2013 at [facility] reportedly showed a large cell, B-cell lymphoma. The patient has developed "B" symptoms including unexplained fatigue, fevers and drenching sweats. He presented to [facility] 01/05/2014 with worsening shortness of breath. Workup here included a chest CT 01/05/2014 which showed diffuse upper abdominal and mediastinal adenopathy, with bilateral pleural effusions. V/Q scan was low probability for PE and bilateral LE dopples were negative for DVT. Echo 01/06/2014 showed an EF or 55-60%. He developed A-fib 01/06/2014 and has been started on cardizem. Other problems include acute renal injury, DM, HTN, dyslipidemia with atherosclerosis noted on CT scans and OSA requiring norcturnal C-pap"  The patient's subsequent history is as detailed below  INTERVAL HISTORY: Mr. Ressler returns today accompanied by his wife for follow-up of his non-Hodgkin's lymphoma. Today is day 1, cycle 6 of 8 planned cycles of treatment; he receives Neulasta on day 2. He was discharged from the hospital on 04/18/14 after a 7 day secondary to neutropenic fevers and mild GI bleed. He has had home health set back up, but the aide only comes a few days a week to bathe him and his wife has picked up the slack on the  other days. She states that she can not manage him by herself, because she has a bad back and knee. It took 3 nurses to get him in and out of the car this morning. Mr. Mcneel is very weak and can barely stand. He worked with physical therapy on Friday and is supposed to have a portable commode delivered, but they are also in need of a wheelchair.   REVIEW OF SYSTEMS: Mr. Yoshino denies fevers, chill, nausea, or vomiting. He has not had a bowel movement in 2 day, and is taking stool softeners and miralax daily. He believes he may be able to have one today. His appetite is decreased so he is not eating well, though he has been taking in some protein drinks. He finds it difficult to swallow because his throat is sore and he takes his carafate daily. He also has diflucan for thrush and a nystatin suspension to swish and spit. His arms ache. He has some numbness to his bilateral hands. There is a rash on his bilateral lower legs starting at his ankles that has been there for 3-4 days. It itched at first so he applied alcohol. It does not itch now but it is still just as red.   PAST MEDICAL HISTORY: Past Medical History  Diagnosis Date  . Hypertension   . Hyperlipemia   . Anxiety   . Lymphadenopathy, abdominal   . Urethral stricture   . Anemia   . BPPV (benign paroxysmal positional vertigo)   . Lymphoma   . Diabetes mellitus without complication   . Obstructive sleep apnea     uses cpap at home  . Sleep apnea     PAST  SURGICAL HISTORY: Past Surgical History  Procedure Laterality Date  . Back surgery    . Prostate surgery    . Tonsillectomy      FAMILY HISTORY No family history on file. Patient's father had a "muscle disease" and died from unclear causes age 77. Patient's mother died from heartproblems age 24. The patient had 1 brother, 5 sisters. 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 He  and his wife [name] have ben married 36 years. Son Kayson lives in [place] and works in Press photographer. Son [name] lives in [place] and is retired (disabled). Son [name] runs a maintenance shop in [place], where he lives. Son [name] works in telephones in [place]. [name]'s wife, [name], is an Therapist, sports and worked for [employer] many years. She now works for [employer].The patient has 8 gch and 6 ggch. He is a Psychologist, forensic    ADVANCED DIRECTIVES: place. Mr Meine tells me "if they can't do anything for me" he would not want resuscitation or life support. His wife [name] is his health care POA, with son [name] listed second.   HEALTH MAINTENANCE: History  Substance Use Topics  . Smoking status: Former Smoker -- 30 years    Quit date: 04/12/1968  . Smokeless tobacco: Never Used  . Alcohol Use: No     Colonoscopy:  PAP:  Bone density:  Lipid panel:  Allergies  Allergen Reactions  . Rituximab Other (See Comments)    Developed wheezing & rigors when infusion rate increased to 100 mg/hr.  Tolerates rate of 50 mg/hr.  . Caffeine Other (See Comments)    Makes patient feel like he's going to die  . Morphine And Related Other (See Comments)    excitability  . Penicillins Rash    Current Outpatient Prescriptions  Medication Sig Dispense Refill  . allopurinol (ZYLOPRIM) 300 MG tablet Take 1 tablet (300 mg total) by mouth daily. 90 tablet 1  . aspirin 325 MG tablet Take 1 tablet (325 mg total) by mouth daily. 30 tablet 0  . clorazepate (TRANXENE) 7.5 MG tablet Take 1 tablet (7.5 mg total) by mouth 2 (two) times daily as needed for anxiety or sleep. (Patient taking differently: Take 7.5 mg by mouth every morning. ) 180 tablet 0  . clotrimazole-betamethasone (LOTRISONE) cream Apply 1 application topically 2 (two) times daily.    Marland Kitchen diltiazem (CARDIZEM CD) 120 MG 24 hr capsule Take 1 capsule (120 mg total) by mouth daily. 90 capsule 1  . docusate sodium 100 MG CAPS Take 100 mg by mouth 2 (two) times daily. 10 capsule  0  . feeding supplement, ENSURE COMPLETE, (ENSURE COMPLETE) LIQD Take 237 mLs by mouth daily. 20 Bottle 1  . fluconazole (DIFLUCAN) 100 MG tablet Take 1 tablet (100 mg total) by mouth daily. 30 tablet 0  . glucose blood test strip ONE TOUCH ULTRA .CHECK BLOOD SUGAR AC AND HS DAILY 480 each 3  . insulin aspart (NOVOLOG) 100 UNIT/ML injection Inject 0-20 Units into the skin 3 (three) times daily with meals. 60 mL 3  . lidocaine-prilocaine (EMLA) cream Apply 1 application topically as needed. APPLY TO PORT SITE 1 HOUR PRIOR TO THERAPY. DO NOT RUB IN. COVER WITH SARAN WRAP. 30 g 2  . loratadine (CLARITIN) 10 MG tablet Take 10 mg by mouth every morning.    . metoprolol (LOPRESSOR) 50 MG tablet Take 25 mg by mouth 2 (  two) times daily.      . naproxen (NAPROSYN) 375 MG tablet Take 1 tablet (375 mg total) by mouth 3 (three) times daily with meals. 20 tablet 0  . nystatin (MYCOSTATIN) 100000 UNIT/ML suspension SWISH AND SWALLOW 5 MLS BY MOUTH FOUR TIMES DAILY 240 mL 3  . polyethylene glycol (MIRALAX / GLYCOLAX) packet Take 17 g by mouth daily.    . predniSONE (DELTASONE) 20 MG tablet Take 3 tablets (60 mg total) by mouth daily with breakfast. (Patient taking differently: Take 60 mg by mouth daily with breakfast. For 5 days after treatment) 15 tablet 5  . simvastatin (ZOCOR) 20 MG tablet Take 1 tablet (20 mg total) by mouth daily. 90 tablet 3  . sucralfate (CARAFATE) 1 GM/10ML suspension Take 10 mLs (1 g total) by mouth 4 (four) times daily -  with meals and at bedtime. 420 mL 1  . miconazole (MICATIN) 2 % cream Apply 1 application topically 2 (two) times daily. Apply to groin area twice daily until clear (Patient not taking: Reported on 04/25/2014) 45 g 0  . ondansetron (ZOFRAN) 8 MG tablet Take 1 tablet (8 mg total) by mouth every 12 (twelve) hours. (Patient not taking: Reported on 04/12/2014) 20 tablet 0  . predniSONE (DELTASONE) 10 MG tablet Take 1 tablet (10 mg total) by mouth daily with breakfast. 30  tablet 0  . traMADol (ULTRAM) 50 MG tablet Take 50 mg by mouth every 6 (six) hours as needed for moderate pain.     No current facility-administered medications for this visit.    OBJECTIVE: elderly white man who appears well  Filed Vitals:   04/25/14 0835  BP: 126/70  Pulse: 100  Temp: 97.4 F (36.3 C)  Resp: 18     Body mass index is 0.00 kg/(m^2).    ECOG FS:1 - Symptomatic but completely ambulatory  Skin: warm, dry, erythematous rash to bilateral lower legs HEENT: sclerae anicteric, conjunctivae pink. Mild thrush coating to tongue Lymph Nodes: No cervical or supraclavicular lymphadenopathy  Lungs: clear to auscultation bilaterally, no rales, wheezes, or rhonci  Heart: regular rate and rhythm  Abdomen: round, soft, non tender, positive bowel sounds  Musculoskeletal: No focal spinal tenderness, no peripheral edema  Neuro: non focal, well oriented, positive affect    LAB RESULTS:  CMP     Component Value Date/Time   NA 133* 04/25/2014 0815   NA 134* 04/18/2014 0416   K 3.7 04/25/2014 0815   K 4.4 04/18/2014 0416   CL 96 04/18/2014 0416   CO2 29 04/25/2014 0815   CO2 29 04/18/2014 0416   GLUCOSE 154* 04/25/2014 0815   GLUCOSE 143* 04/18/2014 0416   BUN 22.8 04/25/2014 0815   BUN 12 04/18/2014 0416   CREATININE 0.8 04/25/2014 0815   CREATININE 0.71 04/18/2014 0416   CALCIUM 9.8 04/25/2014 0815   CALCIUM 8.3* 04/18/2014 0416   PROT 6.5 04/25/2014 0815   PROT 5.3* 04/18/2014 0416   ALBUMIN 2.9* 04/25/2014 0815   ALBUMIN 2.4* 04/18/2014 0416   AST 24 04/25/2014 0815   AST 29 04/18/2014 0416   ALT 15 04/25/2014 0815   ALT 14 04/18/2014 0416   ALKPHOS 86 04/25/2014 0815   ALKPHOS 73 04/18/2014 0416   BILITOT 0.50 04/25/2014 0815   BILITOT 0.4 04/18/2014 0416   GFRNONAA 83* 04/18/2014 0416   GFRAA >90 04/18/2014 0416    INo results found for: SPEP, UPEP  Lab Results  Component Value Date   WBC 15.8* 04/25/2014   NEUTROABS  12.1* 04/25/2014   HGB 12.7*  04/25/2014   HCT 39.7 04/25/2014   MCV 83.8 04/25/2014   PLT 318 04/25/2014      Chemistry      Component Value Date/Time   NA 133* 04/25/2014 0815   NA 134* 04/18/2014 0416   K 3.7 04/25/2014 0815   K 4.4 04/18/2014 0416   CL 96 04/18/2014 0416   CO2 29 04/25/2014 0815   CO2 29 04/18/2014 0416   BUN 22.8 04/25/2014 0815   BUN 12 04/18/2014 0416   CREATININE 0.8 04/25/2014 0815   CREATININE 0.71 04/18/2014 0416      Component Value Date/Time   CALCIUM 9.8 04/25/2014 0815   CALCIUM 8.3* 04/18/2014 0416   ALKPHOS 86 04/25/2014 0815   ALKPHOS 73 04/18/2014 0416   AST 24 04/25/2014 0815   AST 29 04/18/2014 0416   ALT 15 04/25/2014 0815   ALT 14 04/18/2014 0416   BILITOT 0.50 04/25/2014 0815   BILITOT 0.4 04/18/2014 0416       No results found for: LABCA2  No components found for: OXBDZ329  No results for input(s): INR in the last 168 hours.  Urinalysis    Component Value Date/Time   COLORURINE YELLOW 04/12/2014 2117   APPEARANCEUR CLEAR 04/12/2014 2117   LABSPEC 1.014 04/12/2014 2117   PHURINE 7.5 04/12/2014 2117   GLUCOSEU NEGATIVE 04/12/2014 2117   HGBUR NEGATIVE 04/12/2014 2117   BILIRUBINUR NEGATIVE 04/12/2014 2117   KETONESUR NEGATIVE 04/12/2014 2117   PROTEINUR NEGATIVE 04/12/2014 2117   UROBILINOGEN 1.0 04/12/2014 2117   NITRITE NEGATIVE 04/12/2014 2117   LEUKOCYTESUR NEGATIVE 04/12/2014 2117    STUDIES: Dg Tibia/fibula Left  04/18/2014   CLINICAL DATA:  Left leg pain and weakness follow multiple falls at home recently.  EXAM: LEFT TIBIA AND FIBULA - 2 VIEW  COMPARISON:  None.  FINDINGS: Anterior tibial tubercle spur. Mild patellar spur formation. No fracture or dislocation. Medium and lateral meniscus calcification.  IMPRESSION: 1. No fracture or dislocation. 2. Chondrocalcinosis and mild degenerative changes.   Electronically Signed   By: Enrique Sack M.D.   On: 04/18/2014 15:16   Dg Chest Port 1 View  04/12/2014   CLINICAL DATA:  Fever, sore  throat.  Cough.  History of lymphoma.  EXAM: PORTABLE CHEST - 1 VIEW  COMPARISON:  PET-CT 03/09/2014.  Radiograph 01/17/2014  FINDINGS: Right chest port remains in place, tip in the mid SVC. Diminished left pleural effusion with minimal residual blunting of left costophrenic angle. No airspace consolidation to suggest pneumonia. The cardiomediastinal contours are unchanged. There is no pneumothorax.  IMPRESSION: Diminished left pleural effusion with minimal residual blunting of the costophrenic angle. No acute pulmonary process.   Electronically Signed   By: Jeb Levering M.D.   On: 04/12/2014 19:15   Dg Hip Unilat With Pelvis 2-3 Views Left  04/18/2014   CLINICAL DATA:  Several falls outcome recently. LEFT leg weakness. LEFT hip pain radiating down the LEFT leg. Initial encounter.  EXAM: RADIOLOGY EXAMINATION  COMPARISON:  None.  FINDINGS: AP view of the pelvis and AP and lateral views of the LEFT hip are submitted for interpretation. LEFT hip is located. Negative for fracture. Pelvic rings appear intact. Sacral arcades appear normal.  IMPRESSION: No acute osseous abnormality.   Electronically Signed   By: Dereck Ligas M.D.   On: 04/18/2014 15:23   Dg Femur Min 2 Views Left  04/18/2014   CLINICAL DATA:  Several falls at home recently.  LEFT leg  weakness.  EXAM: RADIOLOGY EXAMINATION  COMPARISON:  None.  FINDINGS: Atherosclerosis is present. There is no fracture or acute osseous abnormality. AP and lateral views of the femur submitted for interpretation. The visualized portions of the knee demonstrate chondrocalcinosis. No aggressive osseous lesions.  IMPRESSION: No acute injury to the LEFT femur. Chondrocalcinosis of the knee, probably senile degenerative in this age group.   Electronically Signed   By: Dereck Ligas M.D.   On: 04/18/2014 15:14    ASSESSMENT: 81 y.o. [name] with a new diagnosis of diffuse large-cell, B-cell non-Hodgkin's lymphoma  (1) rituximab started 01/07/2014, with  initial infusion reaction but able to receive entire dose that evening; to be repeated every 21 days   (2) dose-reduced CHOP/Rituxan started 01/10/2014--to be repeated every 21 days.  (a) patient was able to tolerate rituxan well starting with second infusion  (b) prednisone inadvertently left out of cycle 2, resumed cycle 3  (3) tumor lysis syndrome while hospitalized: very favorable initial reaction to treatment; following LDH and uric acid  (4) AKI while hospitalized (01/05/14): creatinine has normalized since  (5) hospitalizedd 04/12/14-04/18/14 for neutropenic fevers, mild GI bleed   PLAN: Mr. Plotts is very weak today. The labs were reviewed in detail and were relatively stable. I consulted with Dr. Jana Hakim regarding continuing with cycle 6 of RCHOP today, and he agreed that treatment should be held given the extent of Mr. Ganoe' weakness. Instead I was given instructions to order 10mg  predinsone daily, a repeat echocardiogram, and a PET scan to be performed in the next few weeks. I cautioned Mr. Dingee to continue his diflucan daily while on the prednisone to prevent steroid induced thrush. He will continue both drugs until he returns in February. At this time we will discuss scan results and treatment options moving forward.    I am in the process of having my nurse contact [facility]. He will need a nurse aide to come by daily x 2 weeks until he regains his strength. He will also need a wheelchair for home use and I have also written him a prescription in a physical copy just in case. Mr. Kouba will return in February for a follow up visit. He understands and agrees with this plan. He has been encouraged to call with any issues that might arise before his next visit here. He can certainly be seen sooner if necessary.   Marcelino Duster, NP   04/25/2014 9:36 AM

## 2014-04-26 ENCOUNTER — Ambulatory Visit: Payer: Medicare Other

## 2014-04-27 ENCOUNTER — Other Ambulatory Visit: Payer: Self-pay | Admitting: *Deleted

## 2014-04-27 ENCOUNTER — Telehealth: Payer: Self-pay | Admitting: Nurse Practitioner

## 2014-04-27 DIAGNOSIS — R531 Weakness: Secondary | ICD-10-CM

## 2014-04-27 DIAGNOSIS — R1310 Dysphagia, unspecified: Secondary | ICD-10-CM

## 2014-04-27 DIAGNOSIS — R059 Cough, unspecified: Secondary | ICD-10-CM

## 2014-04-27 DIAGNOSIS — R05 Cough: Secondary | ICD-10-CM

## 2014-04-27 NOTE — Progress Notes (Signed)
Message received from [place], speech therapist with [facility] stating per her assessment noted concerns with swallowing and possible aspiration.  Sonia Baller is requesting if MD would order a modified barium swallowing test.  Per MD above is appropriate- order placed.

## 2014-04-27 NOTE — Telephone Encounter (Signed)
per pof to sch pt ECHO-cld & spoke to wife lois and gave time & date-per GM to CX 1/15 appt-wife aware

## 2014-04-28 ENCOUNTER — Telehealth: Payer: Self-pay | Admitting: Oncology

## 2014-04-28 ENCOUNTER — Other Ambulatory Visit (HOSPITAL_COMMUNITY): Payer: Self-pay | Admitting: Oncology

## 2014-04-28 DIAGNOSIS — R1310 Dysphagia, unspecified: Secondary | ICD-10-CM

## 2014-04-29 ENCOUNTER — Ambulatory Visit: Payer: Medicare Other | Admitting: Nurse Practitioner

## 2014-04-29 ENCOUNTER — Other Ambulatory Visit: Payer: Medicare Other

## 2014-04-29 ENCOUNTER — Telehealth: Payer: Self-pay | Admitting: *Deleted

## 2014-04-29 NOTE — Telephone Encounter (Signed)
Called pt to f/u to see how he is doing. Talked to his wife, she told me that pt is very weak today. She said he had a good PT session today the therapist walked him using the gait belt and he did well, but it worn him out. Wife told me the [facility] has been coming and this has been a tremendous help especially with the bathing. She commented pt is still having difficulty with swallowing and sometimes get choked drinking water  but is drinking Boost. Pt has a bedside commode and wheelchair now. Pt is unable to walk on his own and has to have help when getting to bathroom. Message to be forwarded to [facility]

## 2014-05-02 ENCOUNTER — Encounter (HOSPITAL_COMMUNITY): Payer: Self-pay | Admitting: *Deleted

## 2014-05-02 ENCOUNTER — Ambulatory Visit (HOSPITAL_COMMUNITY)
Admission: RE | Admit: 2014-05-02 | Discharge: 2014-05-02 | Disposition: A | Discharge status: Home / Self Care | Payer: Medicare Other | Source: Ambulatory Visit | Attending: Oncology | Admitting: Oncology

## 2014-05-02 ENCOUNTER — Inpatient Hospital Stay (HOSPITAL_COMMUNITY)
Admission: EM | Admit: 2014-05-02 | Discharge: 2014-05-10 | DRG: 840 | Disposition: A | Discharge status: Hospice | Payer: Medicare Other | Attending: Internal Medicine | Admitting: Internal Medicine

## 2014-05-02 ENCOUNTER — Other Ambulatory Visit: Payer: Self-pay | Admitting: *Deleted

## 2014-05-02 ENCOUNTER — Other Ambulatory Visit: Payer: Self-pay

## 2014-05-02 ENCOUNTER — Ambulatory Visit (HOSPITAL_COMMUNITY): Admission: RE | Admit: 2014-05-02 | Payer: Medicare Other | Source: Ambulatory Visit

## 2014-05-02 ENCOUNTER — Inpatient Hospital Stay (HOSPITAL_COMMUNITY): Payer: Medicare Other

## 2014-05-02 ENCOUNTER — Emergency Department (HOSPITAL_COMMUNITY): Payer: Medicare Other

## 2014-05-02 DIAGNOSIS — T380X5A Adverse effect of glucocorticoids and synthetic analogues, initial encounter: Secondary | ICD-10-CM | POA: Diagnosis present

## 2014-05-02 DIAGNOSIS — R059 Cough, unspecified: Secondary | ICD-10-CM

## 2014-05-02 DIAGNOSIS — G629 Polyneuropathy, unspecified: Secondary | ICD-10-CM | POA: Diagnosis present

## 2014-05-02 DIAGNOSIS — T17908A Unspecified foreign body in respiratory tract, part unspecified causing other injury, initial encounter: Secondary | ICD-10-CM | POA: Insufficient documentation

## 2014-05-02 DIAGNOSIS — R1312 Dysphagia, oropharyngeal phase: Secondary | ICD-10-CM | POA: Diagnosis present

## 2014-05-02 DIAGNOSIS — Z7952 Long term (current) use of systemic steroids: Secondary | ICD-10-CM

## 2014-05-02 DIAGNOSIS — W19XXXA Unspecified fall, initial encounter: Secondary | ICD-10-CM | POA: Insufficient documentation

## 2014-05-02 DIAGNOSIS — R633 Feeding difficulties, unspecified: Secondary | ICD-10-CM | POA: Insufficient documentation

## 2014-05-02 DIAGNOSIS — E871 Hypo-osmolality and hyponatremia: Secondary | ICD-10-CM | POA: Diagnosis present

## 2014-05-02 DIAGNOSIS — R05 Cough: Secondary | ICD-10-CM

## 2014-05-02 DIAGNOSIS — I129 Hypertensive chronic kidney disease with stage 1 through stage 4 chronic kidney disease, or unspecified chronic kidney disease: Secondary | ICD-10-CM | POA: Diagnosis present

## 2014-05-02 DIAGNOSIS — G8191 Hemiplegia, unspecified affecting right dominant side: Secondary | ICD-10-CM | POA: Insufficient documentation

## 2014-05-02 DIAGNOSIS — R0902 Hypoxemia: Secondary | ICD-10-CM

## 2014-05-02 DIAGNOSIS — E785 Hyperlipidemia, unspecified: Secondary | ICD-10-CM | POA: Diagnosis present

## 2014-05-02 DIAGNOSIS — R531 Weakness: Secondary | ICD-10-CM | POA: Insufficient documentation

## 2014-05-02 DIAGNOSIS — R471 Dysarthria and anarthria: Secondary | ICD-10-CM | POA: Diagnosis present

## 2014-05-02 DIAGNOSIS — Z87891 Personal history of nicotine dependence: Secondary | ICD-10-CM

## 2014-05-02 DIAGNOSIS — R1310 Dysphagia, unspecified: Secondary | ICD-10-CM

## 2014-05-02 DIAGNOSIS — B370 Candidal stomatitis: Secondary | ICD-10-CM | POA: Diagnosis present

## 2014-05-02 DIAGNOSIS — E119 Type 2 diabetes mellitus without complications: Secondary | ICD-10-CM | POA: Diagnosis present

## 2014-05-02 DIAGNOSIS — W19XXXS Unspecified fall, sequela: Secondary | ICD-10-CM

## 2014-05-02 DIAGNOSIS — R64 Cachexia: Secondary | ICD-10-CM | POA: Diagnosis present

## 2014-05-02 DIAGNOSIS — I480 Paroxysmal atrial fibrillation: Secondary | ICD-10-CM | POA: Diagnosis present

## 2014-05-02 DIAGNOSIS — C8330 Diffuse large B-cell lymphoma, unspecified site: Secondary | ICD-10-CM

## 2014-05-02 DIAGNOSIS — T451X5A Adverse effect of antineoplastic and immunosuppressive drugs, initial encounter: Secondary | ICD-10-CM | POA: Diagnosis present

## 2014-05-02 DIAGNOSIS — D630 Anemia in neoplastic disease: Secondary | ICD-10-CM | POA: Diagnosis present

## 2014-05-02 DIAGNOSIS — Z515 Encounter for palliative care: Secondary | ICD-10-CM

## 2014-05-02 DIAGNOSIS — Z7982 Long term (current) use of aspirin: Secondary | ICD-10-CM | POA: Diagnosis not present

## 2014-05-02 DIAGNOSIS — Z66 Do not resuscitate: Secondary | ICD-10-CM | POA: Diagnosis present

## 2014-05-02 DIAGNOSIS — K1231 Oral mucositis (ulcerative) due to antineoplastic therapy: Secondary | ICD-10-CM | POA: Diagnosis present

## 2014-05-02 DIAGNOSIS — J189 Pneumonia, unspecified organism: Secondary | ICD-10-CM | POA: Diagnosis not present

## 2014-05-02 DIAGNOSIS — N183 Chronic kidney disease, stage 3 unspecified: Secondary | ICD-10-CM | POA: Diagnosis present

## 2014-05-02 DIAGNOSIS — M25551 Pain in right hip: Secondary | ICD-10-CM | POA: Diagnosis present

## 2014-05-02 DIAGNOSIS — Z794 Long term (current) use of insulin: Secondary | ICD-10-CM | POA: Diagnosis not present

## 2014-05-02 DIAGNOSIS — M25511 Pain in right shoulder: Secondary | ICD-10-CM | POA: Diagnosis present

## 2014-05-02 DIAGNOSIS — E43 Unspecified severe protein-calorie malnutrition: Secondary | ICD-10-CM | POA: Diagnosis present

## 2014-05-02 DIAGNOSIS — G4733 Obstructive sleep apnea (adult) (pediatric): Secondary | ICD-10-CM | POA: Diagnosis present

## 2014-05-02 DIAGNOSIS — I639 Cerebral infarction, unspecified: Secondary | ICD-10-CM

## 2014-05-02 DIAGNOSIS — R29810 Facial weakness: Secondary | ICD-10-CM | POA: Diagnosis present

## 2014-05-02 DIAGNOSIS — Z6822 Body mass index (BMI) 22.0-22.9, adult: Secondary | ICD-10-CM

## 2014-05-02 DIAGNOSIS — Y92009 Unspecified place in unspecified non-institutional (private) residence as the place of occurrence of the external cause: Secondary | ICD-10-CM

## 2014-05-02 DIAGNOSIS — Y95 Nosocomial condition: Secondary | ICD-10-CM | POA: Diagnosis not present

## 2014-05-02 DIAGNOSIS — E46 Unspecified protein-calorie malnutrition: Secondary | ICD-10-CM

## 2014-05-02 LAB — URINALYSIS, ROUTINE W REFLEX MICROSCOPIC
BILIRUBIN URINE: NEGATIVE
GLUCOSE, UA: NEGATIVE mg/dL
HGB URINE DIPSTICK: NEGATIVE
Ketones, ur: NEGATIVE mg/dL
LEUKOCYTES UA: NEGATIVE
Nitrite: NEGATIVE
PH: 7 (ref 5.0–8.0)
Protein, ur: NEGATIVE mg/dL
SPECIFIC GRAVITY, URINE: 1.017 (ref 1.005–1.030)
Urobilinogen, UA: 1 mg/dL (ref 0.0–1.0)

## 2014-05-02 LAB — ETHANOL: Alcohol, Ethyl (B): <5 mg/dL (ref 0–9)

## 2014-05-02 LAB — CBC
HEMATOCRIT: 37.1 % — AB (ref 39.0–52.0)
Hemoglobin: 12.5 g/dL — ABNORMAL LOW (ref 13.0–17.0)
MCH: 27.9 pg (ref 26.0–34.0)
MCHC: 33.7 g/dL (ref 30.0–36.0)
MCV: 82.8 fL (ref 78.0–100.0)
Platelets: 303 10*3/uL (ref 150–400)
RBC: 4.48 MIL/uL (ref 4.22–5.81)
RDW: 17.1 % — AB (ref 11.5–15.5)
WBC: 14.5 10*3/uL — ABNORMAL HIGH (ref 4.0–10.5)

## 2014-05-02 LAB — COMPREHENSIVE METABOLIC PANEL
ALT: 14 U/L (ref 0–53)
AST: 25 U/L (ref 0–37)
Albumin: 2.6 g/dL — ABNORMAL LOW (ref 3.5–5.2)
Alkaline Phosphatase: 72 U/L (ref 39–117)
Anion gap: 11 (ref 5–15)
BUN: 24 mg/dL — ABNORMAL HIGH (ref 6–23)
CO2: 30 mmol/L (ref 19–32)
CREATININE: 0.8 mg/dL (ref 0.50–1.35)
Calcium: 9.1 mg/dL (ref 8.4–10.5)
Chloride: 94 mEq/L — ABNORMAL LOW (ref 96–112)
GFR, EST NON AFRICAN AMERICAN: 79 mL/min — AB (ref >90)
Glucose, Bld: 188 mg/dL — ABNORMAL HIGH (ref 70–99)
Potassium: 4.8 mmol/L (ref 3.5–5.1)
Sodium: 135 mmol/L (ref 135–145)
Total Bilirubin: 0.5 mg/dL (ref 0.3–1.2)
Total Protein: 6.2 g/dL (ref 6.0–8.3)

## 2014-05-02 LAB — CBG MONITORING, ED: GLUCOSE-CAPILLARY: 194 mg/dL — AB (ref 70–99)

## 2014-05-02 LAB — RAPID URINE DRUG SCREEN, HOSP PERFORMED
Amphetamines: NOT DETECTED
BARBITURATES: NOT DETECTED
Benzodiazepines: POSITIVE — AB
Cocaine: NOT DETECTED
Opiates: NOT DETECTED
TETRAHYDROCANNABINOL: NOT DETECTED

## 2014-05-02 LAB — DIFFERENTIAL
BASOS ABS: 0 10*3/uL (ref 0.0–0.1)
Basophils Relative: 0 % (ref 0–1)
EOS ABS: 0 10*3/uL (ref 0.0–0.7)
Eosinophils Relative: 0 % (ref 0–5)
LYMPHS PCT: 10 % — AB (ref 12–46)
Lymphs Abs: 1.5 10*3/uL (ref 0.7–4.0)
MONO ABS: 0.8 10*3/uL (ref 0.1–1.0)
MONOS PCT: 6 % (ref 3–12)
Neutro Abs: 12.1 10*3/uL — ABNORMAL HIGH (ref 1.7–7.7)
Neutrophils Relative %: 84 % — ABNORMAL HIGH (ref 43–77)

## 2014-05-02 LAB — I-STAT CHEM 8, ED
BUN: 29 mg/dL — ABNORMAL HIGH (ref 6–23)
CALCIUM ION: 1.11 mmol/L — AB (ref 1.13–1.30)
CREATININE: 0.8 mg/dL (ref 0.50–1.35)
Chloride: 93 mEq/L — ABNORMAL LOW (ref 96–112)
Glucose, Bld: 188 mg/dL — ABNORMAL HIGH (ref 70–99)
HCT: 41 % (ref 39.0–52.0)
Hemoglobin: 13.9 g/dL (ref 13.0–17.0)
POTASSIUM: 4.6 mmol/L (ref 3.5–5.1)
SODIUM: 134 mmol/L — AB (ref 135–145)
TCO2: 30 mmol/L (ref 0–100)

## 2014-05-02 LAB — GLUCOSE, CAPILLARY
GLUCOSE-CAPILLARY: 170 mg/dL — AB (ref 70–99)
Glucose-Capillary: 112 mg/dL — ABNORMAL HIGH (ref 70–99)

## 2014-05-02 LAB — PROTIME-INR
INR: 1.07 (ref 0.00–1.49)
PROTHROMBIN TIME: 14 s (ref 11.6–15.2)

## 2014-05-02 LAB — I-STAT TROPONIN, ED: TROPONIN I, POC: 0.01 ng/mL (ref 0.00–0.08)

## 2014-05-02 LAB — APTT: aPTT: 37 seconds (ref 24–37)

## 2014-05-02 MED ORDER — SODIUM CHLORIDE 0.9 % IJ SOLN
3.0000 mL | Freq: Two times a day (BID) | INTRAMUSCULAR | Status: DC
  Administered 2014-05-03 – 2014-05-09 (×12): 3 mL via INTRAVENOUS

## 2014-05-02 MED ORDER — ONDANSETRON HCL 4 MG/2ML IJ SOLN: 4.0000 mg | Freq: Four times a day (QID) | INTRAMUSCULAR | Status: DC | PRN

## 2014-05-02 MED ORDER — ACETAMINOPHEN 650 MG RE SUPP: 650.0000 mg | Freq: Four times a day (QID) | RECTAL | Status: DC | PRN

## 2014-05-02 MED ORDER — METOPROLOL TARTRATE 25 MG PO TABS
25.0000 mg | ORAL_TABLET | Freq: Two times a day (BID) | ORAL | Status: DC
Start: 2014-05-02 — End: 2014-05-07
  Administered 2014-05-02 – 2014-05-07 (×8): 25 mg via ORAL
  Filled 2014-05-02 (×8): qty 1

## 2014-05-02 MED ORDER — ENSURE COMPLETE PO LIQD: 237.0000 mL | ORAL | Status: DC

## 2014-05-02 MED ORDER — ACETAMINOPHEN 325 MG PO TABS
650.0000 mg | ORAL_TABLET | Freq: Four times a day (QID) | ORAL | Status: DC | PRN
  Administered 2014-05-04: 650 mg via ORAL
  Filled 2014-05-02: qty 2

## 2014-05-02 MED ORDER — SUCRALFATE 1 GM/10ML PO SUSP
1.0000 g | Freq: Three times a day (TID) | ORAL | Status: DC
  Administered 2014-05-02 – 2014-05-07 (×12): 1 g via ORAL
  Filled 2014-05-02 (×17): qty 10

## 2014-05-02 MED ORDER — TRAMADOL HCL 50 MG PO TABS
50.0000 mg | ORAL_TABLET | Freq: Four times a day (QID) | ORAL | Status: DC | PRN
  Administered 2014-05-02: 50 mg via ORAL
  Filled 2014-05-02: qty 1

## 2014-05-02 MED ORDER — DILTIAZEM HCL ER COATED BEADS 120 MG PO CP24
120.0000 mg | ORAL_CAPSULE | Freq: Every day | ORAL | Status: DC
Start: 2014-05-02 — End: 2014-05-07
  Administered 2014-05-03 – 2014-05-06 (×3): 120 mg via ORAL
  Filled 2014-05-02 (×6): qty 1

## 2014-05-02 MED ORDER — ENOXAPARIN SODIUM 40 MG/0.4ML ~~LOC~~ SOLN
40.0000 mg | SUBCUTANEOUS | Status: DC
  Administered 2014-05-02 – 2014-05-08 (×7): 40 mg via SUBCUTANEOUS
  Filled 2014-05-02 (×8): qty 0.4

## 2014-05-02 MED ORDER — ALLOPURINOL 300 MG PO TABS
300.0000 mg | ORAL_TABLET | Freq: Every day | ORAL | Status: DC
  Administered 2014-05-03 – 2014-05-07 (×4): 300 mg via ORAL
  Filled 2014-05-02: qty 1
  Filled 2014-05-02 (×2): qty 3
  Filled 2014-05-02: qty 1

## 2014-05-02 MED ORDER — LORATADINE 10 MG PO TABS
10.0000 mg | ORAL_TABLET | ORAL | Status: DC
  Administered 2014-05-03 – 2014-05-07 (×4): 10 mg via ORAL
  Filled 2014-05-02 (×4): qty 1

## 2014-05-02 MED ORDER — ONDANSETRON HCL 4 MG PO TABS: 4.0000 mg | ORAL_TABLET | Freq: Four times a day (QID) | ORAL | Status: DC | PRN

## 2014-05-02 MED ORDER — GADOBENATE DIMEGLUMINE 529 MG/ML IV SOLN
15.0000 mL | Freq: Once | INTRAVENOUS | Status: AC | PRN
  Administered 2014-05-02: 15 mL via INTRAVENOUS

## 2014-05-02 MED ORDER — CLORAZEPATE DIPOTASSIUM 3.75 MG PO TABS
7.5000 mg | ORAL_TABLET | Freq: Two times a day (BID) | ORAL | Status: DC | PRN
  Administered 2014-05-03 (×2): 7.5 mg via ORAL
  Filled 2014-05-02 (×2): qty 2

## 2014-05-02 MED ORDER — CHLORHEXIDINE GLUCONATE 0.12 % MT SOLN
15.0000 mL | Freq: Two times a day (BID) | OROMUCOSAL | Status: DC
  Administered 2014-05-02 – 2014-05-10 (×14): 15 mL via OROMUCOSAL
  Filled 2014-05-02 (×14): qty 15

## 2014-05-02 MED ORDER — FLUCONAZOLE 100 MG PO TABS
100.0000 mg | ORAL_TABLET | Freq: Every day | ORAL | Status: DC
Start: 2014-05-02 — End: 2014-05-07
  Administered 2014-05-03 – 2014-05-04 (×2): 100 mg via ORAL
  Filled 2014-05-02 (×5): qty 1

## 2014-05-02 MED ORDER — SODIUM CHLORIDE 0.9 % IV SOLN
INTRAVENOUS | Status: DC
  Administered 2014-05-02 – 2014-05-08 (×5): via INTRAVENOUS

## 2014-05-02 MED ORDER — ONDANSETRON HCL 4 MG PO TABS
8.0000 mg | ORAL_TABLET | Freq: Two times a day (BID) | ORAL | Status: DC
  Administered 2014-05-02: 8 mg via ORAL
  Administered 2014-05-03: 4 mg via ORAL
  Administered 2014-05-03 – 2014-05-09 (×10): 8 mg via ORAL
  Filled 2014-05-02 (×12): qty 2

## 2014-05-02 MED ORDER — INSULIN ASPART 100 UNIT/ML ~~LOC~~ SOLN
0.0000 [IU] | Freq: Every day | SUBCUTANEOUS | Status: DC
  Administered 2014-05-03: 3 [IU] via SUBCUTANEOUS

## 2014-05-02 MED ORDER — MICONAZOLE NITRATE 2 % EX CREA
1.0000 "application " | TOPICAL_CREAM | Freq: Two times a day (BID) | CUTANEOUS | Status: DC
  Administered 2014-05-02 – 2014-05-10 (×16): 1 via TOPICAL
  Filled 2014-05-02 (×2): qty 14

## 2014-05-02 MED ORDER — NYSTATIN 100000 UNIT/ML MT SUSP
5.0000 mL | Freq: Four times a day (QID) | OROMUCOSAL | Status: DC
  Administered 2014-05-02 – 2014-05-10 (×20): 500000 [IU] via ORAL
  Filled 2014-05-02 (×29): qty 5

## 2014-05-02 MED ORDER — ATORVASTATIN CALCIUM 10 MG PO TABS
10.0000 mg | ORAL_TABLET | Freq: Every day | ORAL | Status: DC
  Administered 2014-05-02 – 2014-05-05 (×4): 10 mg via ORAL
  Filled 2014-05-02 (×4): qty 1

## 2014-05-02 MED ORDER — POLYETHYLENE GLYCOL 3350 17 G PO PACK
17.0000 g | PACK | Freq: Every day | ORAL | Status: DC
  Administered 2014-05-03 – 2014-05-07 (×4): 17 g via ORAL
  Filled 2014-05-02 (×4): qty 1

## 2014-05-02 MED ORDER — CETYLPYRIDINIUM CHLORIDE 0.05 % MT LIQD
7.0000 mL | Freq: Two times a day (BID) | OROMUCOSAL | Status: DC
  Administered 2014-05-03 – 2014-05-10 (×13): 7 mL via OROMUCOSAL

## 2014-05-02 MED ORDER — TRAMADOL HCL 50 MG PO TABS
100.0000 mg | ORAL_TABLET | Freq: Four times a day (QID) | ORAL | Status: DC | PRN
  Administered 2014-05-03: 50 mg via ORAL
  Administered 2014-05-05: 100 mg via ORAL
  Filled 2014-05-02 (×2): qty 2

## 2014-05-02 MED ORDER — ONDANSETRON HCL 4 MG/2ML IJ SOLN: 4.0000 mg | Freq: Three times a day (TID) | INTRAMUSCULAR | Status: AC | PRN

## 2014-05-02 MED ORDER — SIMVASTATIN 20 MG PO TABS: 20.0000 mg | ORAL_TABLET | Freq: Every day | ORAL | Status: DC

## 2014-05-02 MED ORDER — ASPIRIN 325 MG PO TABS
325.0000 mg | ORAL_TABLET | Freq: Every day | ORAL | Status: DC
  Administered 2014-05-03 – 2014-05-07 (×4): 325 mg via ORAL
  Filled 2014-05-02 (×4): qty 1

## 2014-05-02 MED ORDER — DOCUSATE SODIUM 100 MG PO CAPS
100.0000 mg | ORAL_CAPSULE | Freq: Two times a day (BID) | ORAL | Status: DC
  Administered 2014-05-03 – 2014-05-06 (×5): 100 mg via ORAL
  Filled 2014-05-02 (×7): qty 1

## 2014-05-02 MED ORDER — PREDNISONE 10 MG PO TABS
10.0000 mg | ORAL_TABLET | Freq: Every day | ORAL | Status: DC
  Administered 2014-05-03 – 2014-05-07 (×4): 10 mg via ORAL
  Filled 2014-05-02: qty 1
  Filled 2014-05-02: qty 2
  Filled 2014-05-02: qty 1
  Filled 2014-05-02: qty 2

## 2014-05-02 MED ORDER — INSULIN ASPART 100 UNIT/ML ~~LOC~~ SOLN
0.0000 [IU] | Freq: Three times a day (TID) | SUBCUTANEOUS | Status: DC
  Administered 2014-05-03: 2 [IU] via SUBCUTANEOUS
  Administered 2014-05-03: 8 [IU] via SUBCUTANEOUS
  Administered 2014-05-03: 2 [IU] via SUBCUTANEOUS
  Administered 2014-05-04: 3 [IU] via SUBCUTANEOUS
  Administered 2014-05-05 – 2014-05-06 (×2): 2 [IU] via SUBCUTANEOUS
  Administered 2014-05-06: 3 [IU] via SUBCUTANEOUS
  Administered 2014-05-07: 2 [IU] via SUBCUTANEOUS

## 2014-05-02 NOTE — Procedures (Signed)
Objective Swallowing Evaluation: Modified Barium Swallowing Study  Patient Details  Name: Erik Ramirez MRN: [id number] Date of Birth: 10-31-1925  Today's Date: 05/02/2014 Time: SLP Start Time (ACUTE ONLY): 1135-SLP Stop Time (ACUTE ONLY): 1240 SLP Time Calculation (min) (ACUTE ONLY): 65 min  Past Medical History:  Past Medical History  Diagnosis Date  . Hypertension   . Hyperlipemia   . Anxiety   . Lymphadenopathy, abdominal   . Urethral stricture   . Anemia   . BPPV (benign paroxysmal positional vertigo)   . Lymphoma   . Diabetes mellitus without complication   . Obstructive sleep apnea     uses cpap at home  . Sleep apnea    Past Surgical History:  Past Surgical History  Procedure Laterality Date  . Back surgery    . Prostate surgery    . Tonsillectomy     HPI:  HPI: 81 year old male with h/o HTN, anxiety, lymphoma, sleep apnea, DM, anemia seen for OP MBS due to c/o dysphagia x 1 week. Patient and son providing history which includes also within the week, new onset right arm weakness and numbness, right leg numbness, significant generalized weakness, severe vocal quality changes, falls. SLP confirms severe dysphonia (almost aphonic) as well as right sided facial droop.   No Data Recorded  Assessment / Plan / Recommendation CHL IP CLINICAL IMPRESSIONS 05/02/2014  Dysphagia Diagnosis Moderate pharyngeal phase dysphagia;Severe pharyngeal phase dysphagia  Clinical impression Patient presents with a moderate-severe primarily pharyngeal dysphagia characterized by base of tongue, laryngeal, and pharyngeal weakness resulting in decreased pharyngeal clearance of bolus and decreased airway protection. SEvere pharyngeal residuals remained post swallow which patient senses, responding with multiple swallows which are moderately effective to clear. Penetrates noted with nectar thick liquids clear with either multiple swallows or clinician cueing for throat clear. Various head postures  assessed (chin tuck, right head turn) and unsuccessful to reduce pharyngeal residuals or facilitate improved airway protection. Overall, risk of aspiration high at this time given severity of deficits, reducing to moderate with strict use of aspiration precautions which were reviewed in full with patient and son. Given severity of neuro symptoms suggestive of CVA, recommended rapid MD f/u. Attempted to contact referring physician, Dr. Jana Hakim, via phone without response. Recommended patient go to ED given stroke symptoms. Patient and family in agreement.       CHL IP TREATMENT RECOMMENDATION 05/02/2014  Treatment Plan Recommendations Defer treatment plan to SLP at (Comment)     CHL IP DIET RECOMMENDATION 05/02/2014  Diet Recommendations Dysphagia 2 (Fine chop);Nectar-thick liquid  Liquid Administration via Cup;No straw  Medication Administration Crushed with puree  Compensations Slow rate;Small sips/bites;Multiple dry swallows after each bite/sip;Clear throat after each swallow  Postural Changes and/or Swallow Maneuvers Seated upright 90 degrees     CHL IP OTHER RECOMMENDATIONS 05/02/2014  Recommended Consults (None)  Oral Care Recommendations Oral care BID  Other Recommendations Prohibited food (jello, ice cream, thin soups)     CHL IP FOLLOW UP RECOMMENDATIONS 05/02/2014  Follow up Recommendations Home health SLP     No flowsheet data found.       SLP Swallow Goals No flowsheet data found.  No flowsheet data found.    CHL IP REASON FOR REFERRAL 05/02/2014  Reason for Referral Objectively evaluate swallowing function     CHL IP ORAL PHASE 05/02/2014  Lips (None)  Tongue (None)  Mucous membranes (None)  Nutritional status (None)  Other (None)  Oxygen therapy (None)  Oral Phase WFL  Oral -  Pudding Teaspoon (None)  Oral - Pudding Cup (None)  Oral - Honey Teaspoon (None)  Oral - Honey Cup (None)  Oral - Honey Syringe (None)  Oral - Nectar Teaspoon (None)  Oral - Nectar Cup  (None)  Oral - Nectar Straw (None)  Oral - Nectar Syringe (None)  Oral - Ice Chips (None)  Oral - Thin Teaspoon (None)  Oral - Thin Cup (None)  Oral - Thin Straw (None)  Oral - Thin Syringe (None)  Oral - Puree (None)  Oral - Mechanical Soft (None)  Oral - Regular (None)  Oral - Multi-consistency (None)  Oral - Pill (None)  Oral Phase - Comment (None)      CHL IP PHARYNGEAL PHASE 05/02/2014  Pharyngeal Phase Impaired  Pharyngeal - Pudding Teaspoon (None)  Penetration/Aspiration details (pudding teaspoon) (None)  Pharyngeal - Pudding Cup (None)  Penetration/Aspiration details (pudding cup) (None)  Pharyngeal - Honey Teaspoon (None)  Penetration/Aspiration details (honey teaspoon) (None)  Pharyngeal - Honey Cup (None)  Penetration/Aspiration details (honey cup) (None)  Pharyngeal - Honey Syringe (None)  Penetration/Aspiration details (honey syringe) (None)  Pharyngeal - Nectar Teaspoon Delayed swallow initiation;Premature spillage to pyriform sinuses;Reduced pharyngeal peristalsis;Reduced epiglottic inversion;Reduced anterior laryngeal mobility;Reduced laryngeal elevation;Reduced airway/laryngeal closure;Reduced tongue base retraction;Penetration/Aspiration before swallow;Penetration/Aspiration during swallow;Pharyngeal residue - valleculae;Pharyngeal residue - pyriform sinuses;Lateral channel residue;Compensatory strategies attempted (Comment)  Penetration/Aspiration details (nectar teaspoon) Material enters airway, CONTACTS cords and not ejected out  Pharyngeal - Nectar Cup Delayed swallow initiation;Premature spillage to pyriform sinuses;Reduced pharyngeal peristalsis;Reduced epiglottic inversion;Reduced anterior laryngeal mobility;Reduced laryngeal elevation;Reduced airway/laryngeal closure;Reduced tongue base retraction;Penetration/Aspiration before swallow;Penetration/Aspiration during swallow;Pharyngeal residue - valleculae;Pharyngeal residue - pyriform sinuses;Lateral channel  residue;Compensatory strategies attempted (Comment)  Penetration/Aspiration details (nectar cup) Material enters airway, CONTACTS cords and not ejected out  Pharyngeal - Nectar Straw (None)  Penetration/Aspiration details (nectar straw) (None)  Pharyngeal - Nectar Syringe (None)  Penetration/Aspiration details (nectar syringe) (None)  Pharyngeal - Ice Chips (None)  Penetration/Aspiration details (ice chips) (None)  Pharyngeal - Thin Teaspoon (None)  Penetration/Aspiration details (thin teaspoon) (None)  Pharyngeal - Thin Cup Delayed swallow initiation;Premature spillage to pyriform sinuses;Reduced pharyngeal peristalsis;Reduced epiglottic inversion;Reduced anterior laryngeal mobility;Reduced laryngeal elevation;Reduced airway/laryngeal closure;Reduced tongue base retraction;Penetration/Aspiration before swallow;Penetration/Aspiration during swallow;Pharyngeal residue - valleculae;Pharyngeal residue - pyriform sinuses;Lateral channel residue;Compensatory strategies attempted (Comment);Moderate aspiration  Penetration/Aspiration details (thin cup) Material enters airway, passes BELOW cords and not ejected out despite cough attempt by patient  Pharyngeal - Thin Straw (None)  Penetration/Aspiration details (thin straw) (None)  Pharyngeal - Thin Syringe (None)  Penetration/Aspiration details (thin syringe') (None)  Pharyngeal - Puree Delayed swallow initiation;Premature spillage to pyriform sinuses;Reduced pharyngeal peristalsis;Reduced epiglottic inversion;Reduced anterior laryngeal mobility;Reduced laryngeal elevation;Reduced airway/laryngeal closure;Reduced tongue base retraction;Pharyngeal residue - valleculae;Pharyngeal residue - pyriform sinuses;Lateral channel residue;Compensatory strategies attempted (Comment)  Penetration/Aspiration details (puree) Material does not enter airway  Pharyngeal - Mechanical Soft Delayed swallow initiation;Premature spillage to pyriform sinuses;Reduced pharyngeal  peristalsis;Reduced epiglottic inversion;Reduced anterior laryngeal mobility;Reduced laryngeal elevation;Reduced airway/laryngeal closure;Reduced tongue base retraction;Pharyngeal residue - valleculae;Pharyngeal residue - pyriform sinuses;Lateral channel residue;Compensatory strategies attempted (Comment)  Penetration/Aspiration details (mechanical soft) Material does not enter airway  Pharyngeal - Regular (None)  Penetration/Aspiration details (regular) (None)  Pharyngeal - Multi-consistency (None)  Penetration/Aspiration details (multi-consistency) (None)  Pharyngeal - Pill (None)  Penetration/Aspiration details (pill) (None)  Pharyngeal Comment (None)     CHL IP CERVICAL ESOPHAGEAL PHASE 05/02/2014  Cervical Esophageal Phase Impaired  Pudding Teaspoon (None)  Pudding Cup (None)  Honey Teaspoon (None)  Honey Cup (None)  Honey Syringe (None)  Nectar Teaspoon (None)  Nectar Cup (None)  Nectar Straw (None)  Nectar Syringe (None)  Thin Teaspoon (None)  Thin Cup (None)  Thin Straw (None)  Thin Syringe (None)  Cervical Esophageal Comment decreased UES relaxation    CHL IP GO 05/02/2014  Functional Assessment Tool Used skilled clinical judgement  Functional Limitations Swallowing  Swallow Current Status (N3614) CL  Swallow Goal Status (E3154) CL  Swallow Discharge Status (M0867) CL  Motor Speech Current Status (Y1950) (None)  Motor Speech Goal Status (D3267) (None)  Motor Speech Goal Status (T2458) (None)  Spoken Language Comprehension Current Status (K9983) (None)  Spoken Language Comprehension Goal Status (J8250) (None)  Spoken Language Comprehension Discharge Status (N3976) (None)  Spoken Language Expression Current Status (B3419) (None)  Spoken Language Expression Goal Status (F7902) (None)  Spoken Language Expression Discharge Status [phone]) (None)  Attention Current Status (Z3299) (None)  Attention Goal Status (M4268) (None)  Attention Discharge Status (T4196) (None)   Memory Current Status (Q2297) (None)  Memory Goal Status (L8921) (None)  Memory Discharge Status (J9417) (None)  Voice Current Status (E0814) (None)  Voice Goal Status (G8185) (None)  Voice Discharge Status (U3149) (None)  Other Speech-Language Pathology Functional Limitation [phone]) (None)  Other Speech-Language Pathology Functional Limitation Goal Status (Z8588) (None)  Other Speech-Language Pathology Functional Limitation Discharge Status [phone]) (None)   Gabriel Rainwater MA, CCC-SLP [phone]         Adrieanna Boteler Meryl 05/02/2014, 1:24 PM

## 2014-05-02 NOTE — Progress Notes (Signed)
COURTESY NOTE: appreciate your help to this 81 y/o [name] admitted with FTTh and Right-sided weakness. Wwe follow him for NHL, and had planned six cycles of dose-reduced CHOP, but held the 6th cycle because clinically he had not recovered from cycle 5--given 04/04/2014. -- As he has been off chemo > 4 weeks I would expect the current symptoms are not directly related, but he is elderly and has significant comorbidities so he may require more support (SNF? Rehab? PT?) to fully recover. 0-- At this point no further chemo is planned and after appropriate restaging he will be followed with observation and maintenance rituximab.   I will be temporarily unavailable, but plese let us know if we can be of further help.    SUMMARY: 81 y.o. [name] with a new diagnosis of diffuse large-cell, B-cell non-Hodgkin's lymphoma  (1) rituximab started 01/07/2014, with initial infusion reaction but able to receive entire dose that evening; to be repeated every 21 days   (2) dose-reduced CHOP/Rituxan started 01/10/2014--to be repeated every 21 days. (a) patient was able to tolerate rituxan well starting with second infusion (b) prednisone inadvertently left out of cycle 2, resumed cycle 3  (c) chemotherapy discontinued after cycle 5 (04/04/2014) due to FTTh  (3) tumor lysis syndrome while hospitalized: very favorable initial reaction to treatment; following LDH and uric acid  (4) AKI while hospitalized (01/05/14): creatinine has normalized since  (5) hospitalizedd 04/12/14-04/18/14 for neutropenic fevers, mild GI bleed  (6) maintenance rituximab to be started when patient stable

## 2014-05-02 NOTE — ED Notes (Signed)
Pt removed from bedpan. Pt cleaned. Neuro MD and family at bedside.

## 2014-05-02 NOTE — ED Provider Notes (Addendum)
CSN: [id number]     Arrival date & time 05/02/14  1231 History   First MD Initiated Contact with Patient 05/02/14 1253     Chief Complaint  Patient presents with  . Extremity Weakness     (Consider location/radiation/quality/duration/timing/severity/associated sxs/prior Treatment) HPI Comments: The patient is an 81 year old male, he has had significant complications related to his chemotherapy treated large B-cell lymphoma. He has recently been admitted to the hospital for what was a neutropenic fever, had a significant blood loss of 3 units of blood, he was transfused from a hemoglobin of 6.5. He has been weak since that time and had a physical therapy appointment today he was found to have right-sided weakness and facial droop. The patient and family member state that he did have a fall last week but states that this has been a new problem over the last several days, the patient states his right leg feels like "lead". He denies any pain at this time, he does endorse generalized weakness but no fevers chills nausea vomiting coughing diarrhea or shortness of breath.  Patient is a 81 y.o. male presenting with extremity weakness. The history is provided by the patient.  Extremity Weakness    Past Medical History  Diagnosis Date  . Hypertension   . Hyperlipemia   . Anxiety   . Lymphadenopathy, abdominal   . Urethral stricture   . Anemia   . BPPV (benign paroxysmal positional vertigo)   . Lymphoma   . Diabetes mellitus without complication   . Obstructive sleep apnea     uses cpap at home  . Sleep apnea    Past Surgical History  Procedure Laterality Date  . Back surgery    . Prostate surgery    . Tonsillectomy     No family history on file. History  Substance Use Topics  . Smoking status: Former Smoker -- 30 years    Quit date: 04/12/1968  . Smokeless tobacco: Never Used  . Alcohol Use: No    Review of Systems  Musculoskeletal: Positive for extremity weakness.  All other  systems reviewed and are negative.     Allergies  Rituximab; Caffeine; Morphine and related; and Penicillins  Home Medications   Prior to Admission medications   Medication Sig Start Date End Date Taking? Authorizing Provider  allopurinol (ZYLOPRIM) 300 MG tablet Take 1 tablet (300 mg total) by mouth daily. 01/31/14  Yes Minette Headland, NP  aspirin 325 MG tablet Take 1 tablet (325 mg total) by mouth daily. 01/12/14  Yes Kelvin Cellar, MD  clorazepate (TRANXENE) 7.5 MG tablet Take 1 tablet (7.5 mg total) by mouth 2 (two) times daily as needed for anxiety or sleep. Patient taking differently: Take 7.5 mg by mouth every morning.  04/05/14  Yes Chauncey Cruel, MD  clotrimazole-betamethasone (LOTRISONE) cream Apply 1 application topically 2 (two) times daily as needed (for itching).    Yes Historical Provider, MD  diltiazem (CARDIZEM CD) 120 MG 24 hr capsule Take 1 capsule (120 mg total) by mouth daily. 01/31/14  Yes Minette Headland, NP  docusate sodium 100 MG CAPS Take 100 mg by mouth 2 (two) times daily. 04/17/14  Yes Eugenie Filler, MD  feeding supplement, ENSURE COMPLETE, (ENSURE COMPLETE) LIQD Take 237 mLs by mouth daily. 01/12/14  Yes Kelvin Cellar, MD  fluconazole (DIFLUCAN) 100 MG tablet Take 1 tablet (100 mg total) by mouth daily. 04/17/14  Yes Eugenie Filler, MD  insulin aspart (NOVOLOG) 100 UNIT/ML injection Inject 0-20  Units into the skin 3 (three) times daily with meals. 04/05/14  Yes Chauncey Cruel, MD  lidocaine-prilocaine (EMLA) cream Apply 1 application topically as needed. APPLY TO PORT SITE 1 HOUR PRIOR TO THERAPY. DO NOT RUB IN. COVER WITH SARAN WRAP. 01/27/14  Yes Chauncey Cruel, MD  loratadine (CLARITIN) 10 MG tablet Take 10 mg by mouth every morning.   Yes Historical Provider, MD  metoprolol (LOPRESSOR) 50 MG tablet Take 25 mg by mouth 2 (two) times daily.     Yes Historical Provider, MD  naproxen (NAPROSYN) 375 MG tablet Take 1 tablet (375 mg total) by  mouth 3 (three) times daily with meals. Patient taking differently: Take 375 mg by mouth 3 (three) times daily as needed for moderate pain.  01/17/14  Yes Garald Balding, NP  nystatin (MYCOSTATIN) 100000 UNIT/ML suspension SWISH AND SWALLOW 5 MLS BY MOUTH FOUR TIMES DAILY Patient taking differently: Take 5 mLs by mouth 4 (four) times daily. SWISH AND SWALLOW 03/01/14  Yes Marcelino Duster, NP  ondansetron (ZOFRAN) 8 MG tablet Take 1 tablet (8 mg total) by mouth every 12 (twelve) hours. Patient taking differently: Take 8 mg by mouth 2 (two) times daily as needed for nausea.  01/12/14  Yes Kelvin Cellar, MD  polyethylene glycol (MIRALAX / GLYCOLAX) packet Take 17 g by mouth daily.   Yes Historical Provider, MD  predniSONE (DELTASONE) 10 MG tablet Take 1 tablet (10 mg total) by mouth daily with breakfast. 04/25/14  Yes Marcelino Duster, NP  simvastatin (ZOCOR) 20 MG tablet Take 1 tablet (20 mg total) by mouth daily. 03/21/14  Yes Marcelino Duster, NP  sucralfate (CARAFATE) 1 GM/10ML suspension Take 10 mLs (1 g total) by mouth 4 (four) times daily -  with meals and at bedtime. 04/22/14  Yes Chauncey Cruel, MD  traMADol (ULTRAM) 50 MG tablet Take 50 mg by mouth every 6 (six) hours as needed for moderate pain.   Yes Historical Provider, MD  glucose blood test strip ONE TOUCH ULTRA .CHECK BLOOD SUGAR AC AND HS DAILY 04/05/14   Chauncey Cruel, MD  miconazole (MICATIN) 2 % cream Apply 1 application topically 2 (two) times daily. Apply to groin area twice daily until clear Patient not taking: Reported on 04/25/2014 02/08/14   Marcelino Duster, NP  predniSONE (DELTASONE) 20 MG tablet Take 3 tablets (60 mg total) by mouth daily with breakfast. Patient taking differently: Take 60 mg by mouth daily with breakfast. For 5 days after treatment 02/22/14   Chauncey Cruel, MD   BP 166/82 mmHg  Pulse 71  Temp(Src) 97.3 F (36.3 C) (Oral)  Resp 13  SpO2 96% Physical Exam  Constitutional: He appears  well-developed and well-nourished. No distress.  HENT:  Head: Normocephalic and atraumatic.  Mouth/Throat: Oropharynx is clear and moist. No oropharyngeal exudate.  Dry MM  Eyes: Conjunctivae and EOM are normal. Pupils are equal, round, and reactive to light. Right eye exhibits no discharge. Left eye exhibits no discharge. No scleral icterus.  Neck: Normal range of motion. Neck supple. No JVD present. No thyromegaly present.  Cardiovascular: Normal rate, regular rhythm, normal heart sounds and intact distal pulses.  Exam reveals no gallop and no friction rub.   No murmur heard. Pulmonary/Chest: Effort normal and breath sounds normal. No respiratory distress. He has no wheezes. He has no rales.  Abdominal: Soft. Bowel sounds are normal. He exhibits no distension and no mass. There is no tenderness.  Musculoskeletal: Normal range  of motion. He exhibits no edema or tenderness.  Lymphadenopathy:    He has no cervical adenopathy.  Neurological: He is alert. Coordination normal.  Slight right-sided facial droop, right upper extremity with mild weakness, right lower extremity with significant weakness, the patient is barely able to lift his leg off the bed an inch. His left side has normal strength. His speech is normal, he has dysmetria and the inability to perform finger-nose-finger with the right hand  Skin: Skin is warm and dry. No rash noted. No erythema.  Psychiatric: He has a normal mood and affect. His behavior is normal.  Nursing note and vitals reviewed.   ED Course  Procedures (including critical care time) Labs Review Labs Reviewed  CBC - Abnormal; Notable for the following:    WBC 14.5 (*)    Hemoglobin 12.5 (*)    HCT 37.1 (*)    RDW 17.1 (*)    All other components within normal limits  DIFFERENTIAL - Abnormal; Notable for the following:    Neutrophils Relative % 84 (*)    Neutro Abs 12.1 (*)    Lymphocytes Relative 10 (*)    All other components within normal limits   COMPREHENSIVE METABOLIC PANEL - Abnormal; Notable for the following:    Chloride 94 (*)    Glucose, Bld 188 (*)    BUN 24 (*)    Albumin 2.6 (*)    GFR calc non Af Amer 79 (*)    All other components within normal limits  URINE RAPID DRUG SCREEN (HOSP PERFORMED) - Abnormal; Notable for the following:    Benzodiazepines POSITIVE (*)    All other components within normal limits  URINALYSIS, ROUTINE W REFLEX MICROSCOPIC - Abnormal; Notable for the following:    APPearance HAZY (*)    All other components within normal limits  CBG MONITORING, ED - Abnormal; Notable for the following:    Glucose-Capillary 194 (*)    All other components within normal limits  I-STAT CHEM 8, ED - Abnormal; Notable for the following:    Sodium 134 (*)    Chloride 93 (*)    BUN 29 (*)    Glucose, Bld 188 (*)    Calcium, Ion 1.11 (*)    All other components within normal limits  PROTIME-INR  APTT  ETHANOL  I-STAT TROPOININ, ED  I-STAT TROPOININ, ED  I-STAT TROPOININ, ED    Imaging Review Ct Head Wo Contrast  05/02/2014   CLINICAL DATA:  Right upper extremity weakness. Recent diagnosis of lymphoma.  EXAM: CT HEAD WITHOUT CONTRAST  TECHNIQUE: Contiguous axial images were obtained from the base of the skull through the vertex without intravenous contrast.  COMPARISON:  CT scan dated 06/29/2007  FINDINGS: No mass lesion. No midline shift. No acute hemorrhage or hematoma. No extra-axial fluid collections. No evidence of acute infarction. There is a small old white matter infarct high in the right frontal lobe. There is diffuse slight cerebral cortical atrophy, stable. No ventricular dilatation.  Osseous structures are normal.  IMPRESSION: No acute abnormality. 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   Electronically Signed   By: Rozetta Nunnery M.D.   On: 05/02/2014 14:08   Dg Op Swallowing Func-medicare/speech Path  05/02/2014   Procedures by Earley Brooke, CCC-SLP at 05/02/2014 1:24 PM     Author: Earley Brooke, CCC-SLP Service: (none) Author Type: Speech  and Language Pathologist   Filed: 05/02/2014 1:25 PM Note Time: 05/02/2014 1:24 PM Status: Signed  Editor: Earle Gell McCoy, CCC-SLP (Speech and Language Pathologist)      Procedures   1. SLP MODIFIED BARIUM SWALLOW [[id number] (Custom)]       Expand All Collapse All    Objective Swallowing Evaluation: Modified Barium Swallowing Study  Patient Details  Name: Erik Ramirez MRN: [id number] Date of Birth: 01-May-1925  Today's Date: 05/02/2014 Time: SLP Start Time (ACUTE ONLY): 1135-SLP Stop Time (ACUTE ONLY): 1240 SLP Time Calculation (min) (ACUTE ONLY): 65 min  Past Medical History:  Past Medical History  Diagnosis Date  . Hypertension   . Hyperlipemia   . Anxiety   . Lymphadenopathy, abdominal   . Urethral stricture   . Anemia   . BPPV (benign paroxysmal positional vertigo)   . Lymphoma   . Diabetes mellitus without complication   . Obstructive sleep apnea     uses cpap at home  . Sleep apnea    Past Surgical History:  Past Surgical History  Procedure Laterality Date  . Back surgery    . Prostate surgery    . Tonsillectomy     HPI:  HPI: 81 year old male with h/o HTN, anxiety, lymphoma, sleep apnea, DM,  anemia seen for OP MBS due to c/o dysphagia x 1 week. Patient and son  providing history which includes also within the week, new onset right arm  weakness and numbness, right leg numbness, significant generalized  weakness, severe vocal quality changes, falls. SLP confirms severe  dysphonia (almost aphonic) as well as right sided facial droop.   No Data Recorded  Assessment / Plan / Recommendation CHL IP CLINICAL IMPRESSIONS 05/02/2014  Dysphagia Diagnosis Moderate pharyngeal phase dysphagia;Severe pharyngeal  phase dysphagia  Clinical impression Patient presents with a moderate-severe primarily  pharyngeal dysphagia characterized by base of tongue, laryngeal, and  pharyngeal  weakness resulting in decreased pharyngeal clearance of bolus  and decreased airway protection. SEvere pharyngeal residuals remained post  swallow which patient senses, responding with multiple swallows which are  moderately effective to clear. Penetrates noted with nectar thick liquids  clear with either multiple swallows or clinician cueing for throat clear.  Various head postures assessed (chin tuck, right head turn) and  unsuccessful to reduce pharyngeal residuals or facilitate improved airway  protection. Overall, risk of aspiration high at this time given severity  of deficits, reducing to moderate with strict use of aspiration  precautions which were reviewed in full with patient and son. Given  severity of neuro symptoms suggestive of CVA, recommended rapid MD f/u.  Attempted to contact referring physician, Dr. Jana Hakim, via phone without  response. Recommended patient go to ED given stroke symptoms. Patient and  family in agreement.       CHL IP TREATMENT RECOMMENDATION 05/02/2014  Treatment Plan Recommendations Defer treatment plan to SLP at (Comment)     CHL IP DIET RECOMMENDATION 05/02/2014  Diet Recommendations Dysphagia 2 (Fine chop);Nectar-thick liquid  Liquid Administration via Cup;No straw  Medication Administration Crushed with puree  Compensations Slow rate;Small sips/bites;Multiple dry swallows after each  bite/sip;Clear throat after each swallow  Postural Changes and/or Swallow Maneuvers Seated upright 90 degrees     CHL IP OTHER RECOMMENDATIONS 05/02/2014  Recommended Consults (None)  Oral Care Recommendations Oral care BID  Other Recommendations Prohibited food (jello, ice cream, thin soups)     CHL IP FOLLOW UP RECOMMENDATIONS 05/02/2014  Follow up Recommendations Home health SLP     No flowsheet data found.       SLP Swallow Goals No flowsheet data  found.  No flowsheet data found.    CHL IP REASON FOR REFERRAL 05/02/2014  Reason for Referral  Objectively evaluate swallowing function     CHL IP ORAL PHASE 05/02/2014  Lips (None)  Tongue (None)  Mucous membranes (None)  Nutritional status (None)  Other (None)  Oxygen therapy (None)  Oral Phase WFL  Oral - Pudding Teaspoon (None)  Oral - Pudding Cup (None)  Oral - Honey Teaspoon (None)  Oral - Honey Cup (None)  Oral - Honey Syringe (None)  Oral - Nectar Teaspoon (None)  Oral - Nectar Cup (None)  Oral - Nectar Straw (None)  Oral - Nectar Syringe (None)  Oral - Ice Chips (None)  Oral - Thin Teaspoon (None)  Oral - Thin Cup (None)  Oral - Thin Straw (None)  Oral - Thin Syringe (None)  Oral - Puree (None)  Oral - Mechanical Soft (None)  Oral - Regular (None)  Oral - Multi-consistency (None)  Oral - Pill (None)  Oral Phase - Comment (None)      CHL IP PHARYNGEAL PHASE 05/02/2014  Pharyngeal Phase Impaired  Pharyngeal - Pudding Teaspoon (None)  Penetration/Aspiration details (pudding teaspoon) (None)  Pharyngeal - Pudding Cup (None)  Penetration/Aspiration details (pudding cup) (None)  Pharyngeal - Honey Teaspoon (None)  Penetration/Aspiration details (honey teaspoon) (None)  Pharyngeal - Honey Cup (None)  Penetration/Aspiration details (honey cup) (None)  Pharyngeal - Honey Syringe (None)  Penetration/Aspiration details (honey syringe) (None)  Pharyngeal - Nectar Teaspoon Delayed swallow initiation;Premature  spillage to pyriform sinuses;Reduced pharyngeal peristalsis;Reduced  epiglottic inversion;Reduced anterior laryngeal mobility;Reduced laryngeal  elevation;Reduced airway/laryngeal closure;Reduced tongue base  retraction;Penetration/Aspiration before swallow;Penetration/Aspiration  during swallow;Pharyngeal residue - valleculae;Pharyngeal residue -  pyriform sinuses;Lateral channel residue;Compensatory strategies attempted  (Comment)  Penetration/Aspiration details (nectar teaspoon) Material enters airway,  CONTACTS cords  and not ejected out  Pharyngeal - Nectar Cup Delayed swallow initiation;Premature spillage to  pyriform sinuses;Reduced pharyngeal peristalsis;Reduced epiglottic  inversion;Reduced anterior laryngeal mobility;Reduced laryngeal  elevation;Reduced airway/laryngeal closure;Reduced tongue base  retraction;Penetration/Aspiration before swallow;Penetration/Aspiration  during swallow;Pharyngeal residue - valleculae;Pharyngeal residue -  pyriform sinuses;Lateral channel residue;Compensatory strategies attempted  (Comment)  Penetration/Aspiration details (nectar cup) Material enters airway,  CONTACTS cords and not ejected out  Pharyngeal - Nectar Straw (None)  Penetration/Aspiration details (nectar straw) (None)  Pharyngeal - Nectar Syringe (None)  Penetration/Aspiration details (nectar syringe) (None)  Pharyngeal - Ice Chips (None)  Penetration/Aspiration details (ice chips) (None)  Pharyngeal - Thin Teaspoon (None)  Penetration/Aspiration details (thin teaspoon) (None)  Pharyngeal - Thin Cup Delayed swallow initiation;Premature spillage to  pyriform sinuses;Reduced pharyngeal peristalsis;Reduced epiglottic  inversion;Reduced anterior laryngeal mobility;Reduced laryngeal  elevation;Reduced airway/laryngeal closure;Reduced tongue base  retraction;Penetration/Aspiration before swallow;Penetration/Aspiration  during swallow;Pharyngeal residue - valleculae;Pharyngeal residue -  pyriform sinuses;Lateral channel residue;Compensatory strategies attempted  (Comment);Moderate aspiration  Penetration/Aspiration details (thin cup) Material enters airway, passes  BELOW cords and not ejected out despite cough attempt by patient  Pharyngeal - Thin Straw (None)  Penetration/Aspiration details (thin straw) (None)  Pharyngeal - Thin Syringe (None)  Penetration/Aspiration details (thin syringe') (None)  Pharyngeal - Puree Delayed swallow initiation;Premature spillage to  pyriform sinuses;Reduced pharyngeal  peristalsis;Reduced epiglottic  inversion;Reduced anterior laryngeal mobility;Reduced laryngeal  elevation;Reduced airway/laryngeal closure;Reduced tongue base  retraction;Pharyngeal residue - valleculae;Pharyngeal residue - pyriform  sinuses;Lateral channel residue;Compensatory strategies attempted  (Comment)  Penetration/Aspiration details (puree) Material does not enter airway  Pharyngeal - Mechanical Soft Delayed swallow initiation;Premature  spillage to pyriform sinuses;Reduced pharyngeal peristalsis;Reduced  epiglottic inversion;Reduced anterior laryngeal mobility;Reduced laryngeal  elevation;Reduced airway/laryngeal closure;Reduced tongue  base  retraction;Pharyngeal residue - valleculae;Pharyngeal residue - pyriform  sinuses;Lateral channel residue;Compensatory strategies attempted  (Comment)  Penetration/Aspiration details (mechanical soft) Material does not enter  airway  Pharyngeal - Regular (None)  Penetration/Aspiration details (regular) (None)  Pharyngeal - Multi-consistency (None)  Penetration/Aspiration details (multi-consistency) (None)  Pharyngeal - Pill (None)  Penetration/Aspiration details (pill) (None)  Pharyngeal Comment (None)     CHL IP CERVICAL ESOPHAGEAL PHASE 05/02/2014  Cervical Esophageal Phase Impaired  Pudding Teaspoon (None)  Pudding Cup (None)  Honey Teaspoon (None)  Honey Cup (None)  Honey Syringe (None)  Nectar Teaspoon (None)  Nectar Cup (None)  Nectar Straw (None)  Nectar Syringe (None)  Thin Teaspoon (None)  Thin Cup (None)  Thin Straw (None)  Thin Syringe (None)  Cervical Esophageal Comment decreased UES relaxation    CHL IP GO 05/02/2014  Functional Assessment Tool Used skilled clinical judgement  Functional Limitations Swallowing  Swallow Current Status (V6122) CL  Swallow Goal Status (E4975) CL  Swallow Discharge Status (P0051) CL  Motor Speech Current Status (T0211) (None)  Motor Speech Goal Status  (Z7356) (None)  Motor Speech Goal Status (P0141) (None)  Spoken Language Comprehension Current Status (C3013) (None)  Spoken Language Comprehension Goal Status (H4388) (None)  Spoken Language Comprehension Discharge Status (I7579) (None)  Spoken Language Expression Current Status (J2820) (None)  Spoken Language Expression Goal Status (U0156) (None)  Spoken Language Expression Discharge Status (F5379) (None)  Attention Current Status (K3276) (None)  Attention Goal Status (D4709) (None)  Attention Discharge Status (K9574) (None)  Memory Current Status (B3403) (None)  Memory Goal Status (J0964) (None)  Memory Discharge Status (R8381) (None)  Voice Current Status (M4037) (None)  Voice Goal Status (V4360) (None)  Voice Discharge Status (O7703) (None)  Other Speech-Language Pathology Functional Limitation (E0352) (None)  Other Speech-Language Pathology Functional Limitation Goal Status (Y8185)  (None)  Other Speech-Language Pathology Functional Limitation Discharge Status  [phone]) (None)   Gabriel Rainwater MA, CCC-SLP [phone]         McCoy Leah Meryl 05/02/2014, 1:24 PM            05/02/2014   CLINICAL DATA: swallowing difficulties   FLUOROSCOPY FOR SWALLOWING FUNCTION STUDY:  Fluoroscopy was provided for swallowing function study, which was  administered by a speech pathologist.  Final results and recommendations  from this study are contained within the speech pathology report.     ED ECG REPORT  I personally interpreted this EKG   Date: 05/02/2014   Rate: 75  Rhythm: normal sinus rhythm and premature atrial contractions (PAC)  QRS Axis: normal  Intervals: normal  ST/T Wave abnormalities: nonspecific T wave changes  Conduction Disutrbances:none  Narrative Interpretation:   Old EKG Reviewed: none available   MDM   Final diagnoses:  Stroke    The patient has abnormal neurologic findings that will require evaluation with CT scan to rule out hemorrhage or  ischemic infarct. Anticipate admission to the hospital for what appears to be acute cerebrovascular accident.  The pt has had no acute findings to suggest stroke however his clinical exam is consistent with that. Care was discussed with Dr. Doy Mince, she is in agreement to see the patient in consultation. We'll discuss with an hospitalist / internist.   Discussed with Dr. Raeanne Barry, he will admit.  Johnna Acosta, MD 05/02/14 1507  Johnna Acosta, MD 05/02/14 [phone]

## 2014-05-02 NOTE — Progress Notes (Signed)
RT Note:  Placed patient on CPAP via FFM, auto titrate settings (min 4.0 max 14.0).  Patient tolerating well at this time.

## 2014-05-02 NOTE — Consult Note (Addendum)
Referring Physician: Dr. Sabra Heck, ED    Chief Complaint: right sided weakness, right facial droop  HPI:                                                                                                                                         Erik Ramirez is an 81 y.o. male with a past medical history significant for HTN, hyperlipidemia, DM, paroxysmal A. fib, diffuse large B-cell lymphoma on chemotherapy, obstructive sleep apnea, recently hospitalized for neutropenic fever and anemia, elevated troponin due to demand ischemia, and was discharged home with home health, readmitted to [facility] due to concerns for stroke due to the above stated symptoms. He indicated that he has ben feeling weak and having not enough power in his legs since discharge from the hospital which caused him to fall in his bathroom approximately 10 days ago. Further, he complains of worsening legs and right arm weakness  for the past 2 or 3 days and said that he can not really walk. Although he is experiencing some pain in his lower back, neck, and legs, he is positive that he can not walk mainly because of legs weakness. Denies numbness upper or lower extremities, bladder or bowel incontinence, HA, vertigo, double vision, difficulty swallowing, language or speech impediment. Upon initial assessment in the ED today he was noted to have right sided weakness and right facial droopiness that prompted CT and MRI brain with contrast unremarkable for acute abnormality. Date last known well: uncertain Time last known well: uncertain tPA Given: no, out of the window   Past Medical History  Diagnosis Date  . Hypertension   . Hyperlipemia   . Anxiety   . Lymphadenopathy, abdominal   . Urethral stricture   . Anemia   . BPPV (benign paroxysmal positional vertigo)   . Lymphoma   . Diabetes mellitus without complication   . Obstructive sleep apnea     uses cpap at home  . Sleep apnea     Past Surgical History  Procedure Laterality  Date  . Back surgery    . Prostate surgery    . Tonsillectomy     Family history: denies brain tumors, brain aneurysm, or epilepsy No family history on file. Social History:  reports that he quit smoking about 46 years ago. He has never used smokeless tobacco. He reports that he does not drink alcohol or use illicit drugs.  Allergies:  Allergies  Allergen Reactions  . Rituximab Other (See Comments)    Developed wheezing & rigors when infusion rate increased to 100 mg/hr.  Tolerates rate of 50 mg/hr.  . Caffeine Other (See Comments)    Makes patient feel like he's going to die  . Morphine And Related Other (See Comments)    excitability  . Penicillins Rash    Medications:  Scheduled: . allopurinol  300 mg Oral Daily  . [START ON 05/03/2014] antiseptic oral rinse  7 mL Mouth Rinse q12n4p  . aspirin  325 mg Oral Daily  . atorvastatin  10 mg Oral q1800  . chlorhexidine  15 mL Mouth Rinse BID  . diltiazem  120 mg Oral Daily  . docusate sodium  100 mg Oral BID  . enoxaparin (LOVENOX) injection  40 mg Subcutaneous Q24H  . feeding supplement (ENSURE COMPLETE)  237 mL Oral Q24H  . fluconazole  100 mg Oral Daily  . [START ON 05/03/2014] insulin aspart  0-15 Units Subcutaneous TID WC  . insulin aspart  0-5 Units Subcutaneous QHS  . [START ON 05/03/2014] loratadine  10 mg Oral BH-q7a  . metoprolol  25 mg Oral BID  . miconazole  1 application Topical BID  . nystatin  5 mL Oral QID  . ondansetron  8 mg Oral Q12H  . polyethylene glycol  17 g Oral Daily  . [START ON 05/03/2014] predniSONE  10 mg Oral Q breakfast  . sodium chloride  3 mL Intravenous Q12H  . sucralfate  1 g Oral TID WC & HS    ROS:                                                                                                                                       History obtained from the patient and  chart review  General ROS: negative for - chills,, fever, night sweats, or weight gain Psychological ROS: negative for - behavioral disorder, hallucinations, memory difficulties, mood swings or suicidal ideation Ophthalmic ROS: negative for - blurry vision, double vision, eye pain or loss of vision ENT ROS: negative for - epistaxis, nasal discharge, oral lesions, sore throat, tinnitus or vertigo Allergy and Immunology ROS: negative for - hives or itchy/watery eyes Hematological and Lymphatic ROS: negative for - bleeding problems, bruising or swollen lymph nodes Endocrine ROS: negative for - galactorrhea, hair pattern changes, polydipsia/polyuria or temperature intolerance Respiratory ROS: negative for - cough, hemoptysis, shortness of breath or wheezing Cardiovascular ROS: negative for - chest pain, dyspnea on exertion, edema or irregular heartbeat Gastrointestinal ROS: negative for - abdominal pain, diarrhea, hematemesis, nausea/vomiting or stool incontinence Genito-Urinary ROS: negative for - dysuria, hematuria, incontinence or urinary frequency/urgency Musculoskeletal ROS: negative for - joint swelling Neurological ROS: as noted in HPI Dermatological ROS: negative for rash and skin lesion changes  Physical exam: pleasant male in no apparent distress. Blood pressure 191/96, pulse 95, temperature 98.1 F (36.7 C), temperature source Oral, resp. rate 16, SpO2 95 %. Head: normocephalic. Neck: supple, no bruits, no JVD. Cardiac: no murmurs. Lungs: clear. Abdomen: soft, no tender, no mass. Extremities: no edema. Skin: no rash Neurologic Examination:  General: Mental Status: Alert, oriented, thought content appropriate.  Speech fluent without evidence of aphasia.  Able to follow 3 step commands without difficulty. Cranial Nerves: II: Discs flat bilaterally; Visual fields grossly normal,  pupils equal, round, reactive to light and accommodation III,IV, VI: ptosis not present, extra-ocular motions intact bilaterally V,VII: smile symmetric, facial light touch sensation normal bilaterally VIII: hearing normal bilaterally IX,X: gag reflex present XI: bilateral shoulder shrug XII: midline tongue extension without atrophy or fasciculations Motor: Significant for mild right sided weakness. Tone and bulk:normal tone throughout; no atrophy noted Sensory: Pinprick and light touch intact throughout, bilaterally Deep Tendon Reflexes:  1+ all over Plantars: Right: downgoing   Left: downgoing Cerebellar: normal finger-to-nose,  heel-to-shin test unable to test due to weakness Gait: Unable to test for safety reasons.    Results for orders placed or performed during the hospital encounter of 05/02/14 (from the past 48 hour(s))  CBG monitoring, ED     Status: Abnormal   Collection Time: 05/02/14  1:15 PM  Result Value Ref Range   Glucose-Capillary 194 (H) 70 - 99 mg/dL   Comment 1 Notify RN   Protime-INR     Status: None   Collection Time: 05/02/14  1:16 PM  Result Value Ref Range   Prothrombin Time 14.0 11.6 - 15.2 seconds   INR 1.07 0.00 - 1.49  APTT     Status: None   Collection Time: 05/02/14  1:16 PM  Result Value Ref Range   aPTT 37 24 - 37 seconds    Comment:        IF BASELINE aPTT IS ELEVATED, SUGGEST PATIENT RISK ASSESSMENT BE USED TO DETERMINE APPROPRIATE ANTICOAGULANT THERAPY.   CBC     Status: Abnormal   Collection Time: 05/02/14  1:16 PM  Result Value Ref Range   WBC 14.5 (H) 4.0 - 10.5 K/uL   RBC 4.48 4.22 - 5.81 MIL/uL   Hemoglobin 12.5 (L) 13.0 - 17.0 g/dL   HCT 37.1 (L) 39.0 - 52.0 %   MCV 82.8 78.0 - 100.0 fL   MCH 27.9 26.0 - 34.0 pg   MCHC 33.7 30.0 - 36.0 g/dL   RDW 17.1 (H) 11.5 - 15.5 %   Platelets 303 150 - 400 K/uL  Differential     Status: Abnormal   Collection Time: 05/02/14  1:16 PM  Result Value Ref Range   Neutrophils Relative %  84 (H) 43 - 77 %   Neutro Abs 12.1 (H) 1.7 - 7.7 K/uL   Lymphocytes Relative 10 (L) 12 - 46 %   Lymphs Abs 1.5 0.7 - 4.0 K/uL   Monocytes Relative 6 3 - 12 %   Monocytes Absolute 0.8 0.1 - 1.0 K/uL   Eosinophils Relative 0 0 - 5 %   Eosinophils Absolute 0.0 0.0 - 0.7 K/uL   Basophils Relative 0 0 - 1 %   Basophils Absolute 0.0 0.0 - 0.1 K/uL  Comprehensive metabolic panel     Status: Abnormal   Collection Time: 05/02/14  1:16 PM  Result Value Ref Range   Sodium 135 135 - 145 mmol/L    Comment: Please note change in reference range.   Potassium 4.8 3.5 - 5.1 mmol/L    Comment: Please note change in reference range.   Chloride 94 (L) 96 - 112 mEq/L   CO2 30 19 - 32 mmol/L   Glucose, Bld 188 (H) 70 - 99 mg/dL   BUN 24 (H) 6 - 23 mg/dL  Creatinine, Ser 0.80 0.50 - 1.35 mg/dL   Calcium 9.1 8.4 - 10.5 mg/dL   Total Protein 6.2 6.0 - 8.3 g/dL   Albumin 2.6 (L) 3.5 - 5.2 g/dL   AST 25 0 - 37 U/L   ALT 14 0 - 53 U/L   Alkaline Phosphatase 72 39 - 117 U/L   Total Bilirubin 0.5 0.3 - 1.2 mg/dL   GFR calc non Af Amer 79 (L) >90 mL/min   GFR calc Af Amer >90 >90 mL/min    Comment: (NOTE) The eGFR has been calculated using the CKD EPI equation. This calculation has not been validated in all clinical situations. eGFR's persistently <90 mL/min signify possible Chronic Kidney Disease.    Anion gap 11 5 - 15  Ethanol     Status: None   Collection Time: 05/02/14  1:16 PM  Result Value Ref Range   Alcohol, Ethyl (B) <5 0 - 9 mg/dL    Comment:        LOWEST DETECTABLE LIMIT FOR SERUM ALCOHOL IS 11 mg/dL FOR MEDICAL PURPOSES ONLY   I-stat troponin, ED (not at [facility])     Status: None   Collection Time: 05/02/14  1:27 PM  Result Value Ref Range   Troponin i, poc 0.01 0.00 - 0.08 ng/mL   Comment 3            Comment: Due to the release kinetics of cTnI, a negative result within the first hours of the onset of symptoms does not rule out myocardial infarction with certainty. If myocardial  infarction is still suspected, repeat the test at appropriate intervals.   I-Stat Chem 8, ED     Status: Abnormal   Collection Time: 05/02/14  1:29 PM  Result Value Ref Range   Sodium 134 (L) 135 - 145 mmol/L   Potassium 4.6 3.5 - 5.1 mmol/L   Chloride 93 (L) 96 - 112 mEq/L   BUN 29 (H) 6 - 23 mg/dL   Creatinine, Ser 0.80 0.50 - 1.35 mg/dL   Glucose, Bld 188 (H) 70 - 99 mg/dL   Calcium, Ion 1.11 (L) 1.13 - 1.30 mmol/L   TCO2 30 0 - 100 mmol/L   Hemoglobin 13.9 13.0 - 17.0 g/dL   HCT 41.0 39.0 - 52.0 %  Urine Drug Screen     Status: Abnormal   Collection Time: 05/02/14  1:52 PM  Result Value Ref Range   Opiates NONE DETECTED NONE DETECTED   Cocaine NONE DETECTED NONE DETECTED   Benzodiazepines POSITIVE (A) NONE DETECTED   Amphetamines NONE DETECTED NONE DETECTED   Tetrahydrocannabinol NONE DETECTED NONE DETECTED   Barbiturates NONE DETECTED NONE DETECTED    Comment:        DRUG SCREEN FOR MEDICAL PURPOSES ONLY.  IF CONFIRMATION IS NEEDED FOR ANY PURPOSE, NOTIFY LAB WITHIN 5 DAYS.        LOWEST DETECTABLE LIMITS FOR URINE DRUG SCREEN Drug Class       Cutoff (ng/mL) Amphetamine      1000 Barbiturate      200 Benzodiazepine   003 Tricyclics       491 Opiates          300 Cocaine          300 THC              50   Urinalysis, Routine w reflex microscopic     Status: Abnormal   Collection Time: 05/02/14  1:52 PM  Result Value Ref Range  Color, Urine YELLOW YELLOW   APPearance HAZY (A) CLEAR   Specific Gravity, Urine 1.017 1.005 - 1.030   pH 7.0 5.0 - 8.0   Glucose, UA NEGATIVE NEGATIVE mg/dL   Hgb urine dipstick NEGATIVE NEGATIVE   Bilirubin Urine NEGATIVE NEGATIVE   Ketones, ur NEGATIVE NEGATIVE mg/dL   Protein, ur NEGATIVE NEGATIVE mg/dL   Urobilinogen, UA 1.0 0.0 - 1.0 mg/dL   Nitrite NEGATIVE NEGATIVE   Leukocytes, UA NEGATIVE NEGATIVE    Comment: MICROSCOPIC NOT DONE ON URINES WITH NEGATIVE PROTEIN, BLOOD, LEUKOCYTES, NITRITE, OR GLUCOSE <1000 mg/dL.   Glucose, capillary     Status: Abnormal   Collection Time: 05/02/14  6:30 PM  Result Value Ref Range   Glucose-Capillary 112 (H) 70 - 99 mg/dL  Glucose, capillary     Status: Abnormal   Collection Time: 05/02/14  9:41 PM  Result Value Ref Range   Glucose-Capillary 170 (H) 70 - 99 mg/dL   Comment 1 Documented in Chart    Comment 2 Notify RN    Dg Shoulder Right  05/02/2014   CLINICAL DATA:  Pain all over  EXAM: RIGHT SHOULDER - 2+ VIEW  COMPARISON:  None.  FINDINGS: There is no fracture or dislocation. There are mild degenerative changes of the acromioclavicular joint.  IMPRESSION: No acute osseous injury of the right shoulder.   Electronically Signed   By: Kathreen Devoid   On: 05/02/2014 20:18   Ct Head Wo Contrast  05/02/2014   CLINICAL DATA:  Right upper extremity weakness. Recent diagnosis of lymphoma.  EXAM: CT HEAD WITHOUT CONTRAST  TECHNIQUE: Contiguous axial images were obtained from the base of the skull through the vertex without intravenous contrast.  COMPARISON:  CT scan dated 06/29/2007  FINDINGS: No mass lesion. No midline shift. No acute hemorrhage or hematoma. No extra-axial fluid collections. No evidence of acute infarction. There is a small old white matter infarct high in the right frontal lobe. There is diffuse slight cerebral cortical atrophy, stable. No ventricular dilatation.  Osseous structures are normal.  IMPRESSION: No acute abnormality. 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   Electronically Signed   By: Rozetta Nunnery M.D.   On: 05/02/2014 14:08   Mr Jodene Nam Head Wo Contrast  05/02/2014   CLINICAL DATA:  Leg weakness. Difficulty walking. History of lymphoma and diabetes and atrial fibrillation.  EXAM: MRI HEAD WITHOUT AND WITH CONTRAST  MRA HEAD WITHOUT CONTRAST  TECHNIQUE: Multiplanar, multiecho pulse sequences of the brain and surrounding structures were obtained without and with intravenous contrast. Angiographic images of the head were obtained using MRA  technique without contrast.  CONTRAST:  80m MULTIHANCE GADOBENATE DIMEGLUMINE 529 MG/ML IV SOLN  COMPARISON:  CT head 05/02/2014  FINDINGS: MRI HEAD FINDINGS  Negative for acute infarct.  Moderate atrophy. Mild chronic microvascular ischemic changes in the cerebral white matter. Basal ganglia and brainstem and cerebellum normal.  Negative for mass or edema.  Normal enhancement following contrast administration. No enhancing mass lesion identified.  MRA HEAD FINDINGS  Both vertebral arteries are patent to the basilar without significant stenosis. Left vertebral artery dominant. PICA, superior cerebellar, posterior cerebral artery is patent bilaterally without stenosis. Basilar widely patent.  Internal carotid artery widely patent bilaterally. Anterior and middle cerebral arteries widely patent without stenosis  Negative for cerebral aneurysm.  IMPRESSION: Atrophy and mild chronic microvascular ischemia.  No acute infarct  Negative MRA head.   Electronically Signed   By: CFranchot GalloM.D.  On: 05/02/2014 20:14   Mr Jeri Cos OX Contrast  05/02/2014   CLINICAL DATA:  Leg weakness. Difficulty walking. History of lymphoma and diabetes and atrial fibrillation.  EXAM: MRI HEAD WITHOUT AND WITH CONTRAST  MRA HEAD WITHOUT CONTRAST  TECHNIQUE: Multiplanar, multiecho pulse sequences of the brain and surrounding structures were obtained without and with intravenous contrast. Angiographic images of the head were obtained using MRA technique without contrast.  CONTRAST:  58m MULTIHANCE GADOBENATE DIMEGLUMINE 529 MG/ML IV SOLN  COMPARISON:  CT head 05/02/2014  FINDINGS: MRI HEAD FINDINGS  Negative for acute infarct.  Moderate atrophy. Mild chronic microvascular ischemic changes in the cerebral white matter. Basal ganglia and brainstem and cerebellum normal.  Negative for mass or edema.  Normal enhancement following contrast administration. No enhancing mass lesion identified.  MRA HEAD FINDINGS  Both vertebral arteries are  patent to the basilar without significant stenosis. Left vertebral artery dominant. PICA, superior cerebellar, posterior cerebral artery is patent bilaterally without stenosis. Basilar widely patent.  Internal carotid artery widely patent bilaterally. Anterior and middle cerebral arteries widely patent without stenosis  Negative for cerebral aneurysm.  IMPRESSION: Atrophy and mild chronic microvascular ischemia.  No acute infarct  Negative MRA head.   Electronically Signed   By: CFranchot GalloM.D.   On: 05/02/2014 20:14   Dg Op Swallowing Func-medicare/speech Path  05/02/2014   Procedures by LEarley Brooke CCC-SLP at 05/02/2014 1:24 PM    Author: LEarley Brooke CCC-SLP Service: (none) Author Type: Speech  and Language Pathologist   Filed: 05/02/2014 1:25 PM Note Time: 05/02/2014 1:24 PM Status: Signed   Editor: LEarle GellMcCoy, CCC-SLP (Speech and Language Pathologist)      Procedures   1. SLP MODIFIED BARIUM SWALLOW [[[id number](Custom)]       Expand All Collapse All    Objective Swallowing Evaluation: Modified Barium Swallowing Study  Patient Details  Name: DRomir KlimowiczMRN: [id number] of Birth: [birth year] Today's Date: 05/02/2014 Time: SLP Start Time (ACUTE ONLY): 1135-SLP Stop Time (ACUTE ONLY): 1240 SLP Time Calculation (min) (ACUTE ONLY): 65 min  Past Medical History:  Past Medical History  Diagnosis Date  . Hypertension   . Hyperlipemia   . Anxiety   . Lymphadenopathy, abdominal   . Urethral stricture   . Anemia   . BPPV (benign paroxysmal positional vertigo)   . Lymphoma   . Diabetes mellitus without complication   . Obstructive sleep apnea     uses cpap at home  . Sleep apnea    Past Surgical History:  Past Surgical History  Procedure Laterality Date  . Back surgery    . Prostate surgery    . Tonsillectomy     HPI:  HPI: 81year old male with h/o HTN, anxiety, lymphoma, sleep apnea, DM,  anemia seen for OP MBS due to c/o  dysphagia x 1 week. Patient and son  providing history which includes also within the week, new onset right arm  weakness and numbness, right leg numbness, significant generalized  weakness, severe vocal quality changes, falls. SLP confirms severe  dysphonia (almost aphonic) as well as right sided facial droop.   No Data Recorded  Assessment / Plan / Recommendation CHL IP CLINICAL IMPRESSIONS 05/02/2014  Dysphagia Diagnosis Moderate pharyngeal phase dysphagia;Severe pharyngeal  phase dysphagia  Clinical impression Patient presents with a moderate-severe primarily  pharyngeal dysphagia characterized by base of tongue, laryngeal, and  pharyngeal weakness resulting in decreased pharyngeal clearance of bolus  and decreased airway  protection. SEvere pharyngeal residuals remained post  swallow which patient senses, responding with multiple swallows which are  moderately effective to clear. Penetrates noted with nectar thick liquids  clear with either multiple swallows or clinician cueing for throat clear.  Various head postures assessed (chin tuck, right head turn) and  unsuccessful to reduce pharyngeal residuals or facilitate improved airway  protection. Overall, risk of aspiration high at this time given severity  of deficits, reducing to moderate with strict use of aspiration  precautions which were reviewed in full with patient and son. Given  severity of neuro symptoms suggestive of CVA, recommended rapid MD f/u.  Attempted to contact referring physician, Dr. Jana Hakim, via phone without  response. Recommended patient go to ED given stroke symptoms. Patient and  family in agreement.       CHL IP TREATMENT RECOMMENDATION 05/02/2014  Treatment Plan Recommendations Defer treatment plan to SLP at (Comment)     CHL IP DIET RECOMMENDATION 05/02/2014  Diet Recommendations Dysphagia 2 (Fine chop);Nectar-thick liquid  Liquid Administration via Cup;No straw  Medication Administration Crushed with puree   Compensations Slow rate;Small sips/bites;Multiple dry swallows after each  bite/sip;Clear throat after each swallow  Postural Changes and/or Swallow Maneuvers Seated upright 90 degrees     CHL IP OTHER RECOMMENDATIONS 05/02/2014  Recommended Consults (None)  Oral Care Recommendations Oral care BID  Other Recommendations Prohibited food (jello, ice cream, thin soups)     CHL IP FOLLOW UP RECOMMENDATIONS 05/02/2014  Follow up Recommendations Home health SLP     No flowsheet data found.       SLP Swallow Goals No flowsheet data found.  No flowsheet data found.    CHL IP REASON FOR REFERRAL 05/02/2014  Reason for Referral Objectively evaluate swallowing function     CHL IP ORAL PHASE 05/02/2014  Lips (None)  Tongue (None)  Mucous membranes (None)  Nutritional status (None)  Other (None)  Oxygen therapy (None)  Oral Phase WFL  Oral - Pudding Teaspoon (None)  Oral - Pudding Cup (None)  Oral - Honey Teaspoon (None)  Oral - Honey Cup (None)  Oral - Honey Syringe (None)  Oral - Nectar Teaspoon (None)  Oral - Nectar Cup (None)  Oral - Nectar Straw (None)  Oral - Nectar Syringe (None)  Oral - Ice Chips (None)  Oral - Thin Teaspoon (None)  Oral - Thin Cup (None)  Oral - Thin Straw (None)  Oral - Thin Syringe (None)  Oral - Puree (None)  Oral - Mechanical Soft (None)  Oral - Regular (None)  Oral - Multi-consistency (None)  Oral - Pill (None)  Oral Phase - Comment (None)      CHL IP PHARYNGEAL PHASE 05/02/2014  Pharyngeal Phase Impaired  Pharyngeal - Pudding Teaspoon (None)  Penetration/Aspiration details (pudding teaspoon) (None)  Pharyngeal - Pudding Cup (None)  Penetration/Aspiration details (pudding cup) (None)  Pharyngeal - Honey Teaspoon (None)  Penetration/Aspiration details (honey teaspoon) (None)  Pharyngeal - Honey Cup (None)  Penetration/Aspiration details (honey cup) (None)  Pharyngeal - Honey Syringe (None)   Penetration/Aspiration details (honey syringe) (None)  Pharyngeal - Nectar Teaspoon Delayed swallow initiation;Premature  spillage to pyriform sinuses;Reduced pharyngeal peristalsis;Reduced  epiglottic inversion;Reduced anterior laryngeal mobility;Reduced laryngeal  elevation;Reduced airway/laryngeal closure;Reduced tongue base  retraction;Penetration/Aspiration before swallow;Penetration/Aspiration  during swallow;Pharyngeal residue - valleculae;Pharyngeal residue -  pyriform sinuses;Lateral channel residue;Compensatory strategies attempted  (Comment)  Penetration/Aspiration details (nectar teaspoon) Material enters airway,  CONTACTS cords and not ejected out  Pharyngeal - Nectar Cup Delayed swallow initiation;Premature spillage  to  pyriform sinuses;Reduced pharyngeal peristalsis;Reduced epiglottic  inversion;Reduced anterior laryngeal mobility;Reduced laryngeal  elevation;Reduced airway/laryngeal closure;Reduced tongue base  retraction;Penetration/Aspiration before swallow;Penetration/Aspiration  during swallow;Pharyngeal residue - valleculae;Pharyngeal residue -  pyriform sinuses;Lateral channel residue;Compensatory strategies attempted  (Comment)  Penetration/Aspiration details (nectar cup) Material enters airway,  CONTACTS cords and not ejected out  Pharyngeal - Nectar Straw (None)  Penetration/Aspiration details (nectar straw) (None)  Pharyngeal - Nectar Syringe (None)  Penetration/Aspiration details (nectar syringe) (None)  Pharyngeal - Ice Chips (None)  Penetration/Aspiration details (ice chips) (None)  Pharyngeal - Thin Teaspoon (None)  Penetration/Aspiration details (thin teaspoon) (None)  Pharyngeal - Thin Cup Delayed swallow initiation;Premature spillage to  pyriform sinuses;Reduced pharyngeal peristalsis;Reduced epiglottic  inversion;Reduced anterior laryngeal mobility;Reduced laryngeal  elevation;Reduced airway/laryngeal closure;Reduced tongue base   retraction;Penetration/Aspiration before swallow;Penetration/Aspiration  during swallow;Pharyngeal residue - valleculae;Pharyngeal residue -  pyriform sinuses;Lateral channel residue;Compensatory strategies attempted  (Comment);Moderate aspiration  Penetration/Aspiration details (thin cup) Material enters airway, passes  BELOW cords and not ejected out despite cough attempt by patient  Pharyngeal - Thin Straw (None)  Penetration/Aspiration details (thin straw) (None)  Pharyngeal - Thin Syringe (None)  Penetration/Aspiration details (thin syringe') (None)  Pharyngeal - Puree Delayed swallow initiation;Premature spillage to  pyriform sinuses;Reduced pharyngeal peristalsis;Reduced epiglottic  inversion;Reduced anterior laryngeal mobility;Reduced laryngeal  elevation;Reduced airway/laryngeal closure;Reduced tongue base  retraction;Pharyngeal residue - valleculae;Pharyngeal residue - pyriform  sinuses;Lateral channel residue;Compensatory strategies attempted  (Comment)  Penetration/Aspiration details (puree) Material does not enter airway  Pharyngeal - Mechanical Soft Delayed swallow initiation;Premature  spillage to pyriform sinuses;Reduced pharyngeal peristalsis;Reduced  epiglottic inversion;Reduced anterior laryngeal mobility;Reduced laryngeal  elevation;Reduced airway/laryngeal closure;Reduced tongue base  retraction;Pharyngeal residue - valleculae;Pharyngeal residue - pyriform  sinuses;Lateral channel residue;Compensatory strategies attempted  (Comment)  Penetration/Aspiration details (mechanical soft) Material does not enter  airway  Pharyngeal - Regular (None)  Penetration/Aspiration details (regular) (None)  Pharyngeal - Multi-consistency (None)  Penetration/Aspiration details (multi-consistency) (None)  Pharyngeal - Pill (None)  Penetration/Aspiration details (pill) (None)  Pharyngeal Comment (None)     CHL IP CERVICAL ESOPHAGEAL PHASE 05/02/2014  Cervical Esophageal Phase  Impaired  Pudding Teaspoon (None)  Pudding Cup (None)  Honey Teaspoon (None)  Honey Cup (None)  Honey Syringe (None)  Nectar Teaspoon (None)  Nectar Cup (None)  Nectar Straw (None)  Nectar Syringe (None)  Thin Teaspoon (None)  Thin Cup (None)  Thin Straw (None)  Thin Syringe (None)  Cervical Esophageal Comment decreased UES relaxation    CHL IP GO 05/02/2014  Functional Assessment Tool Used skilled clinical judgement  Functional Limitations Swallowing  Swallow Current Status (J8841) CL  Swallow Goal Status (Y6063) CL  Swallow Discharge Status (K1601) CL  Motor Speech Current Status (U9323) (None)  Motor Speech Goal Status (F5732) (None)  Motor Speech Goal Status (K0254) (None)  Spoken Language Comprehension Current Status (Y7062) (None)  Spoken Language Comprehension Goal Status (B7628) (None)  Spoken Language Comprehension Discharge Status (B1517) (None)  Spoken Language Expression Current Status (O1607) (None)  Spoken Language Expression Goal Status (P7106) (None)  Spoken Language Expression Discharge Status (Y6948) (None)  Attention Current Status (N4627) (None)  Attention Goal Status (O3500) (None)  Attention Discharge Status (X3818) (None)  Memory Current Status (E9937) (None)  Memory Goal Status (J6967) (None)  Memory Discharge Status (E9381) (None)  Voice Current Status (O1751) (None)  Voice Goal Status (W2585) (None)  Voice Discharge Status (I7782) (None)  Other Speech-Language Pathology Functional Limitation (U2353) (None)  Other Speech-Language Pathology Functional Limitation Goal Status (I1443)  (None)  Other Speech-Language Pathology Functional Limitation  Discharge Status  [phone]) (None)   Gabriel Rainwater MA, CCC-SLP [phone]         McCoy Leah Meryl 05/02/2014, 1:24 PM            05/02/2014   CLINICAL DATA: swallowing difficulties   FLUOROSCOPY FOR SWALLOWING FUNCTION STUDY:  Fluoroscopy was provided for swallowing  function study, which was  administered by a speech pathologist.  Final results and recommendations  from this study are contained within the speech pathology report.    Dg Hips Bilat With Pelvis 2v  05/02/2014   CLINICAL DATA:  Fall.  Pain.  EXAM: DG HIP W/ PELVIS 2V BILAT  COMPARISON:  None.  FINDINGS: No acute fracture. No dislocation. Mild osteopenia. Minimal degenerative change of the right hip joint.  IMPRESSION: No acute bony pathology.   Electronically Signed   By: Maryclare Bean M.D.   On: 05/02/2014 20:14     Assessment: 81 y.o. male with multiple medical problems including new diagnosis of diffuse large-cell, B-cell non-Hodgkin's lymphoma, admitted due to right sided weakness and inability to walk. Neuro-exam significant for weakness right arm and leg. MRI brain with contrast and MRA showed no acute abnormalities or other lesions that could explain patient's findings on exam. He is certainly demonstrating objective lateralizing signs on exam. Although there is not evidence of myelopathic signs on exam, I believe it would be prudent to investigate a potential lesion in his cervical spine causing strictly unilateral symptoms. PT. Will follow up.   Dorian Pod, MD [facility] [phone]  05/02/2014, 11:23 PM

## 2014-05-02 NOTE — ED Notes (Signed)
Notified RN of CBG [id number]

## 2014-05-02 NOTE — H&P (Signed)
[facility] History and Physical  Erik Ramirez EXH:[id number] DOB: [birth year] DOA: 05/02/2014  Referring physician: Dr Noemi Chapel PCP: Follows with Dr. Jana Hakim  Chief Complaint:  Weakness x 1-2 weeks  HPI:  81 year old male with history of diffuse large B-cell lymphoma on chemotherapy, diabetes mellitus, paroxysmal A. fib, hypertension, obstructive sleep apnea was recently hospitalized for neutropenic fever and anemia, elevated troponin due to demand ischemia, and was discharged home with home health. Patient reports that he has been feeling weak since his discharge and not having enough strength in his legs. He fell in his bathroom about 10 days back and says that his legs gave away. He thinks he may have hit his right scapula, hip and right knee and leg. Since then he has been progressively feeling weak and having pain in his right shoulder, hips and the right leg. For past 2-3 days he reports that his right leg feels like "lead ". He denies any syncope, headache or dizziness. He denies any tingling or numbness of his extremities. Denies any bowel or urinary symptoms. Denies any blurred vision. He reports poor appetite and difficulty swallowing due to oral thrush.  Patient denies  fever, chills, nausea , vomiting, chest pain, palpitations, SOB, abdominal pain, bowel or urinary symptoms. As reported that his right hand has been week for past 1 week. Pt had presented for outpatient swallow eval today and was referred to ED given complains of right-sided weakness.  Course in the ED  patient's vitals were stable. Blood work done showed WBC of 14.5, hb of 12.5,  and plt  303. Chemistry showed mild hyponatremia and BUN of 29. Blood glucose was 188.  Head CT was done which was unremarkable.   Review of Systems:  Constitutional: Denies fever, chills, diaphoresis, appetite change and fatigue.  HEENT: Denies visual or hearing symptoms, congestion, has difficulty swallowing, denies neck  pain or stiffness Respiratory: Denies SOB, DOE, cough, chest tightness,  and wheezing.   Cardiovascular: Denies chest pain, palpitations and leg swelling.  Gastrointestinal: Denies nausea, vomiting, abdominal pain, diarrhea, constipation, blood in stool and abdominal distention.  Genitourinary: Denies dysuria, , hematuria, flank pain and difficulty urinating.  Endocrine: Denies: hot or cold intolerance, polyuria, polydipsia. Musculoskeletal: Denies myalgias, back pain, pain in  hip , rt shoulder and rt leg Skin: Denies pallor, rash and wound.  Neurological: Generalized weakness,  Denies dizziness, seizures, syncope,light-headedness, numbness and headaches.  Hematological: Denies adenopathy.  Psychiatric/Behavioral: Denies confusion  Past Medical History  Diagnosis Date  . Hypertension   . Hyperlipemia   . Anxiety   . Lymphadenopathy, abdominal   . Urethral stricture   . Anemia   . BPPV (benign paroxysmal positional vertigo)   . Lymphoma   . Diabetes mellitus without complication   . Obstructive sleep apnea     uses cpap at home  . Sleep apnea    Past Surgical History  Procedure Laterality Date  . Back surgery    . Prostate surgery    . Tonsillectomy     Social History:  reports that he quit smoking about 46 years ago. He has never used smokeless tobacco. He reports that he does not drink alcohol or use illicit drugs.  Allergies  Allergen Reactions  . Rituximab Other (See Comments)    Developed wheezing & rigors when infusion rate increased to 100 mg/hr.  Tolerates rate of 50 mg/hr.  . Caffeine Other (See Comments)    Makes patient feel like he's going to die  . Morphine  And Related Other (See Comments)    excitability  . Penicillins Rash    No family history on file.  Prior to Admission medications   Medication Sig Start Date End Date Taking? Authorizing Provider  allopurinol (ZYLOPRIM) 300 MG tablet Take 1 tablet (300 mg total) by mouth daily. 01/31/14  Yes Minette Headland, NP  aspirin 325 MG tablet Take 1 tablet (325 mg total) by mouth daily. 01/12/14  Yes Kelvin Cellar, MD  clorazepate (TRANXENE) 7.5 MG tablet Take 1 tablet (7.5 mg total) by mouth 2 (two) times daily as needed for anxiety or sleep. Patient taking differently: Take 7.5 mg by mouth every morning.  04/05/14  Yes Chauncey Cruel, MD  clotrimazole-betamethasone (LOTRISONE) cream Apply 1 application topically 2 (two) times daily as needed (for itching).    Yes Historical Provider, MD  diltiazem (CARDIZEM CD) 120 MG 24 hr capsule Take 1 capsule (120 mg total) by mouth daily. 01/31/14  Yes Minette Headland, NP  docusate sodium 100 MG CAPS Take 100 mg by mouth 2 (two) times daily. 04/17/14  Yes Eugenie Filler, MD  feeding supplement, ENSURE COMPLETE, (ENSURE COMPLETE) LIQD Take 237 mLs by mouth daily. 01/12/14  Yes Kelvin Cellar, MD  fluconazole (DIFLUCAN) 100 MG tablet Take 1 tablet (100 mg total) by mouth daily. 04/17/14  Yes Eugenie Filler, MD  insulin aspart (NOVOLOG) 100 UNIT/ML injection Inject 0-20 Units into the skin 3 (three) times daily with meals. 04/05/14  Yes Chauncey Cruel, MD  lidocaine-prilocaine (EMLA) cream Apply 1 application topically as needed. APPLY TO PORT SITE 1 HOUR PRIOR TO THERAPY. DO NOT RUB IN. COVER WITH SARAN WRAP. 01/27/14  Yes Chauncey Cruel, MD  loratadine (CLARITIN) 10 MG tablet Take 10 mg by mouth every morning.   Yes Historical Provider, MD  metoprolol (LOPRESSOR) 50 MG tablet Take 25 mg by mouth 2 (two) times daily.     Yes Historical Provider, MD  naproxen (NAPROSYN) 375 MG tablet Take 1 tablet (375 mg total) by mouth 3 (three) times daily with meals. Patient taking differently: Take 375 mg by mouth 3 (three) times daily as needed for moderate pain.  01/17/14  Yes Garald Balding, NP  nystatin (MYCOSTATIN) 100000 UNIT/ML suspension SWISH AND SWALLOW 5 MLS BY MOUTH FOUR TIMES DAILY Patient taking differently: Take 5 mLs by mouth 4 (four) times daily.  SWISH AND SWALLOW 03/01/14  Yes Marcelino Duster, NP  ondansetron (ZOFRAN) 8 MG tablet Take 1 tablet (8 mg total) by mouth every 12 (twelve) hours. Patient taking differently: Take 8 mg by mouth 2 (two) times daily as needed for nausea.  01/12/14  Yes Kelvin Cellar, MD  polyethylene glycol (MIRALAX / GLYCOLAX) packet Take 17 g by mouth daily.   Yes Historical Provider, MD  predniSONE (DELTASONE) 10 MG tablet Take 1 tablet (10 mg total) by mouth daily with breakfast. 04/25/14  Yes Marcelino Duster, NP  simvastatin (ZOCOR) 20 MG tablet Take 1 tablet (20 mg total) by mouth daily. 03/21/14  Yes Marcelino Duster, NP  sucralfate (CARAFATE) 1 GM/10ML suspension Take 10 mLs (1 g total) by mouth 4 (four) times daily -  with meals and at bedtime. 04/22/14  Yes Chauncey Cruel, MD  traMADol (ULTRAM) 50 MG tablet Take 50 mg by mouth every 6 (six) hours as needed for moderate pain.   Yes Historical Provider, MD  glucose blood test strip ONE TOUCH ULTRA .CHECK BLOOD SUGAR AC AND HS DAILY 04/05/14  Chauncey Cruel, MD  miconazole (MICATIN) 2 % cream Apply 1 application topically 2 (two) times daily. Apply to groin area twice daily until clear Patient not taking: Reported on 04/25/2014 02/08/14   Marcelino Duster, NP  predniSONE (DELTASONE) 20 MG tablet Take 3 tablets (60 mg total) by mouth daily with breakfast. Patient taking differently: Take 60 mg by mouth daily with breakfast. For 5 days after treatment 02/22/14   Chauncey Cruel, MD     Physical Exam:  Filed Vitals:   05/02/14 1515 05/02/14 1530 05/02/14 1600 05/02/14 1700  BP: 136/76 157/84 166/82 150/70  Pulse: 57 79 71 56  Temp:    98.6 F (37 C)  TempSrc:    Oral  Resp: 20 15 13 16   SpO2: 95% 96% 96% 99%    Constitutional: Vital signs reviewed. Elderly thin built male lying in bed in no acute distress HEENT: no pallor, no icterus, moist oral mucosa, no cervical lymphadenopathy,  Hoarse voice, neck supple, temporal  wasting Cardiovascular: RRR, S1 normal, S2 normal, no MRG Chest: CTAB, no wheezes, rales, or rhonchi gastrointestinal: Soft. Non-tender, non-distended, bowel sounds are normal,  musculoskeletal: warm, no edema, normal range of motion of the knees and the hip. Nontender  Neurological: A&O x3, 4/5 power in rt arm and hand grip, nomral strength and tone in all other extremities. Normal sensation. Plantar downgoing bilaterally  Labs on Admission:  Basic Metabolic Panel:  Recent Labs Lab 05/02/14 1316 05/02/14 1329  NA 135 134*  K 4.8 4.6  CL 94* 93*  CO2 30  --   GLUCOSE 188* 188*  BUN 24* 29*  CREATININE 0.80 0.80  CALCIUM 9.1  --    Liver Function Tests:  Recent Labs Lab 05/02/14 1316  AST 25  ALT 14  ALKPHOS 72  BILITOT 0.5  PROT 6.2  ALBUMIN 2.6*   No results for input(s): LIPASE, AMYLASE in the last 168 hours. No results for input(s): AMMONIA in the last 168 hours. CBC:  Recent Labs Lab 05/02/14 1316 05/02/14 1329  WBC 14.5*  --   NEUTROABS 12.1*  --   HGB 12.5* 13.9  HCT 37.1* 41.0  MCV 82.8  --   PLT 303  --    Cardiac Enzymes: No results for input(s): CKTOTAL, CKMB, CKMBINDEX, TROPONINI in the last 168 hours. BNP: Invalid input(s): POCBNP CBG:  Recent Labs Lab 05/02/14 1315  GLUCAP 194*    Radiological Exams on Admission: Ct Head Wo Contrast  05/02/2014   CLINICAL DATA:  Right upper extremity weakness. Recent diagnosis of lymphoma.  EXAM: CT HEAD WITHOUT CONTRAST  TECHNIQUE: Contiguous axial images were obtained from the base of the skull through the vertex without intravenous contrast.  COMPARISON:  CT scan dated 06/29/2007  FINDINGS: No mass lesion. No midline shift. No acute hemorrhage or hematoma. No extra-axial fluid collections. No evidence of acute infarction. There is a small old white matter infarct high in the right frontal lobe. There is diffuse slight cerebral cortical atrophy, stable. No ventricular dilatation.  Osseous structures are  normal.  IMPRESSION: No acute abnormality. 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   Electronically Signed   By: Rozetta Nunnery M.D.   On: 05/02/2014 14:08   Dg Op Swallowing Func-medicare/speech Path  05/02/2014   Procedures by Earley Brooke, CCC-SLP at 05/02/2014 1:24 PM    Author: Earley Brooke, CCC-SLP Service: (none) Author Type: Speech  and Language Pathologist   Filed: 05/02/2014 1:25 PM  Note Time: 05/02/2014 1:24 PM Status: Signed   Editor: Leah Meryl McCoy, CCC-SLP (Speech and Language Pathologist)      Procedures   1. SLP MODIFIED BARIUM SWALLOW [[id number] (Custom)]       Expand All Collapse All    Objective Swallowing Evaluation: Modified Barium Swallowing Study  Patient Details  Name: Erik Ramirez MRN: [id number] Date of Birth: 13-Jul-1925  Today's Date: 05/02/2014 Time: SLP Start Time (ACUTE ONLY): 1135-SLP Stop Time (ACUTE ONLY): 1240 SLP Time Calculation (min) (ACUTE ONLY): 65 min  Past Medical History:  Past Medical History  Diagnosis Date  . Hypertension   . Hyperlipemia   . Anxiety   . Lymphadenopathy, abdominal   . Urethral stricture   . Anemia   . BPPV (benign paroxysmal positional vertigo)   . Lymphoma   . Diabetes mellitus without complication   . Obstructive sleep apnea     uses cpap at home  . Sleep apnea    Past Surgical History:  Past Surgical History  Procedure Laterality Date  . Back surgery    . Prostate surgery    . Tonsillectomy     HPI:  HPI: 81 year old male with h/o HTN, anxiety, lymphoma, sleep apnea, DM,  anemia seen for OP MBS due to c/o dysphagia x 1 week. Patient and son  providing history which includes also within the week, new onset right arm  weakness and numbness, right leg numbness, significant generalized  weakness, severe vocal quality changes, falls. SLP confirms severe  dysphonia (almost aphonic) as well as right sided facial droop.   No Data Recorded  Assessment / Plan /  Recommendation CHL IP CLINICAL IMPRESSIONS 05/02/2014  Dysphagia Diagnosis Moderate pharyngeal phase dysphagia;Severe pharyngeal  phase dysphagia  Clinical impression Patient presents with a moderate-severe primarily  pharyngeal dysphagia characterized by base of tongue, laryngeal, and  pharyngeal weakness resulting in decreased pharyngeal clearance of bolus  and decreased airway protection. SEvere pharyngeal residuals remained post  swallow which patient senses, responding with multiple swallows which are  moderately effective to clear. Penetrates noted with nectar thick liquids  clear with either multiple swallows or clinician cueing for throat clear.  Various head postures assessed (chin tuck, right head turn) and  unsuccessful to reduce pharyngeal residuals or facilitate improved airway  protection. Overall, risk of aspiration high at this time given severity  of deficits, reducing to moderate with strict use of aspiration  precautions which were reviewed in full with patient and son. Given  severity of neuro symptoms suggestive of CVA, recommended rapid MD f/u.  Attempted to contact referring physician, Dr. Jana Hakim, via phone without  response. Recommended patient go to ED given stroke symptoms. Patient and  family in agreement.       CHL IP TREATMENT RECOMMENDATION 05/02/2014  Treatment Plan Recommendations Defer treatment plan to SLP at (Comment)     CHL IP DIET RECOMMENDATION 05/02/2014  Diet Recommendations Dysphagia 2 (Fine chop);Nectar-thick liquid  Liquid Administration via Cup;No straw  Medication Administration Crushed with puree  Compensations Slow rate;Small sips/bites;Multiple dry swallows after each  bite/sip;Clear throat after each swallow  Postural Changes and/or Swallow Maneuvers Seated upright 90 degrees     CHL IP OTHER RECOMMENDATIONS 05/02/2014  Recommended Consults (None)  Oral Care Recommendations Oral care BID  Other Recommendations Prohibited food (jello,  ice cream, thin soups)     CHL IP FOLLOW UP RECOMMENDATIONS 05/02/2014  Follow up Recommendations Home health SLP     No flowsheet data found.  SLP Swallow Goals No flowsheet data found.  No flowsheet data found.    CHL IP REASON FOR REFERRAL 05/02/2014  Reason for Referral Objectively evaluate swallowing function     CHL IP ORAL PHASE 05/02/2014  Lips (None)  Tongue (None)  Mucous membranes (None)  Nutritional status (None)  Other (None)  Oxygen therapy (None)  Oral Phase WFL  Oral - Pudding Teaspoon (None)  Oral - Pudding Cup (None)  Oral - Honey Teaspoon (None)  Oral - Honey Cup (None)  Oral - Honey Syringe (None)  Oral - Nectar Teaspoon (None)  Oral - Nectar Cup (None)  Oral - Nectar Straw (None)  Oral - Nectar Syringe (None)  Oral - Ice Chips (None)  Oral - Thin Teaspoon (None)  Oral - Thin Cup (None)  Oral - Thin Straw (None)  Oral - Thin Syringe (None)  Oral - Puree (None)  Oral - Mechanical Soft (None)  Oral - Regular (None)  Oral - Multi-consistency (None)  Oral - Pill (None)  Oral Phase - Comment (None)      CHL IP PHARYNGEAL PHASE 05/02/2014  Pharyngeal Phase Impaired  Pharyngeal - Pudding Teaspoon (None)  Penetration/Aspiration details (pudding teaspoon) (None)  Pharyngeal - Pudding Cup (None)  Penetration/Aspiration details (pudding cup) (None)  Pharyngeal - Honey Teaspoon (None)  Penetration/Aspiration details (honey teaspoon) (None)  Pharyngeal - Honey Cup (None)  Penetration/Aspiration details (honey cup) (None)  Pharyngeal - Honey Syringe (None)  Penetration/Aspiration details (honey syringe) (None)  Pharyngeal - Nectar Teaspoon Delayed swallow initiation;Premature  spillage to pyriform sinuses;Reduced pharyngeal peristalsis;Reduced  epiglottic inversion;Reduced anterior laryngeal mobility;Reduced laryngeal  elevation;Reduced airway/laryngeal closure;Reduced tongue base   retraction;Penetration/Aspiration before swallow;Penetration/Aspiration  during swallow;Pharyngeal residue - valleculae;Pharyngeal residue -  pyriform sinuses;Lateral channel residue;Compensatory strategies attempted  (Comment)  Penetration/Aspiration details (nectar teaspoon) Material enters airway,  CONTACTS cords and not ejected out  Pharyngeal - Nectar Cup Delayed swallow initiation;Premature spillage to  pyriform sinuses;Reduced pharyngeal peristalsis;Reduced epiglottic  inversion;Reduced anterior laryngeal mobility;Reduced laryngeal  elevation;Reduced airway/laryngeal closure;Reduced tongue base  retraction;Penetration/Aspiration before swallow;Penetration/Aspiration  during swallow;Pharyngeal residue - valleculae;Pharyngeal residue -  pyriform sinuses;Lateral channel residue;Compensatory strategies attempted  (Comment)  Penetration/Aspiration details (nectar cup) Material enters airway,  CONTACTS cords and not ejected out  Pharyngeal - Nectar Straw (None)  Penetration/Aspiration details (nectar straw) (None)  Pharyngeal - Nectar Syringe (None)  Penetration/Aspiration details (nectar syringe) (None)  Pharyngeal - Ice Chips (None)  Penetration/Aspiration details (ice chips) (None)  Pharyngeal - Thin Teaspoon (None)  Penetration/Aspiration details (thin teaspoon) (None)  Pharyngeal - Thin Cup Delayed swallow initiation;Premature spillage to  pyriform sinuses;Reduced pharyngeal peristalsis;Reduced epiglottic  inversion;Reduced anterior laryngeal mobility;Reduced laryngeal  elevation;Reduced airway/laryngeal closure;Reduced tongue base  retraction;Penetration/Aspiration before swallow;Penetration/Aspiration  during swallow;Pharyngeal residue - valleculae;Pharyngeal residue -  pyriform sinuses;Lateral channel residue;Compensatory strategies attempted  (Comment);Moderate aspiration  Penetration/Aspiration details (thin cup) Material enters airway, passes  BELOW cords and not ejected out  despite cough attempt by patient  Pharyngeal - Thin Straw (None)  Penetration/Aspiration details (thin straw) (None)  Pharyngeal - Thin Syringe (None)  Penetration/Aspiration details (thin syringe') (None)  Pharyngeal - Puree Delayed swallow initiation;Premature spillage to  pyriform sinuses;Reduced pharyngeal peristalsis;Reduced epiglottic  inversion;Reduced anterior laryngeal mobility;Reduced laryngeal  elevation;Reduced airway/laryngeal closure;Reduced tongue base  retraction;Pharyngeal residue - valleculae;Pharyngeal residue - pyriform  sinuses;Lateral channel residue;Compensatory strategies attempted  (Comment)  Penetration/Aspiration details (puree) Material does not enter airway  Pharyngeal - Mechanical Soft Delayed swallow initiation;Premature  spillage to pyriform sinuses;Reduced pharyngeal peristalsis;Reduced  epiglottic inversion;Reduced anterior laryngeal mobility;Reduced laryngeal  elevation;Reduced airway/laryngeal closure;Reduced tongue base  retraction;Pharyngeal residue - valleculae;Pharyngeal residue - pyriform  sinuses;Lateral channel residue;Compensatory strategies attempted  (Comment)  Penetration/Aspiration details (mechanical soft) Material does not enter  airway  Pharyngeal - Regular (None)  Penetration/Aspiration details (regular) (None)  Pharyngeal - Multi-consistency (None)  Penetration/Aspiration details (multi-consistency) (None)  Pharyngeal - Pill (None)  Penetration/Aspiration details (pill) (None)  Pharyngeal Comment (None)     CHL IP CERVICAL ESOPHAGEAL PHASE 05/02/2014  Cervical Esophageal Phase Impaired  Pudding Teaspoon (None)  Pudding Cup (None)  Honey Teaspoon (None)  Honey Cup (None)  Honey Syringe (None)  Nectar Teaspoon (None)  Nectar Cup (None)  Nectar Straw (None)  Nectar Syringe (None)  Thin Teaspoon (None)  Thin Cup (None)  Thin Straw (None)  Thin Syringe (None)  Cervical Esophageal Comment decreased UES  relaxation    CHL IP GO 05/02/2014  Functional Assessment Tool Used skilled clinical judgement  Functional Limitations Swallowing  Swallow Current Status (I4580) CL  Swallow Goal Status (D9833) CL  Swallow Discharge Status (A2505) CL  Motor Speech Current Status (L9767) (None)  Motor Speech Goal Status (H4193) (None)  Motor Speech Goal Status (X9024) (None)  Spoken Language Comprehension Current Status (O9735) (None)  Spoken Language Comprehension Goal Status (H2992) (None)  Spoken Language Comprehension Discharge Status (E2683) (None)  Spoken Language Expression Current Status (M1962) (None)  Spoken Language Expression Goal Status (I2979) (None)  Spoken Language Expression Discharge Status (G9211) (None)  Attention Current Status (H4174) (None)  Attention Goal Status (Y8144) (None)  Attention Discharge Status (Y1856) (None)  Memory Current Status (D1497) (None)  Memory Goal Status (W2637) (None)  Memory Discharge Status (C5885) (None)  Voice Current Status (O2774) (None)  Voice Goal Status (J2878) (None)  Voice Discharge Status (M7672) (None)  Other Speech-Language Pathology Functional Limitation (C9470) (None)  Other Speech-Language Pathology Functional Limitation Goal Status (J6283)  (None)  Other Speech-Language Pathology Functional Limitation Discharge Status  [phone]) (None)   Gabriel Rainwater MA, CCC-SLP [phone]         McCoy Leah Meryl 05/02/2014, 1:24 PM            05/02/2014   CLINICAL DATA: swallowing difficulties   FLUOROSCOPY FOR SWALLOWING FUNCTION STUDY:  Fluoroscopy was provided for swallowing function study, which was  administered by a speech pathologist.  Final results and recommendations  from this study are contained within the speech pathology report.     EKG: Sinus rhythm at 75 with APCs.  Assessment/Plan  Principal Problem: Rt sided weakness Differential includes CVA versus generalized weakness from recent fall. -Admit to  telemetry. -He had on admission unremarkable. Check MRI brain with contrast and MRA head.. A 2-D echo done 2 weeks back reportedly showed normal EF and no wall motion abnormality. (somehow It was not charted in on Epic). Will defer to neurology if echo needs to be repeated. -We'll check carotid Dopplers. -PT/OT eval. -Neuro checks per unit protocol. -Continue aspirin and statin. Lipid panel in a.m.  Active Problems:  Right shoulder and hip pain Secondary to fall. We'll obtain x-ray of the right shoulder and hips pain control with tramadol    DLBCL (diffuse large B cell lymphoma) Following with a magrinat as outpatient and on chemotherapy.  Diabetes mellitus type 2  continue SSI    CKD (chronic kidney disease), stage III Renal function stable.  Paroxysmal  A. fib Currently in sinus. Continue aspirin and beta blocker    Mucositis due to antineoplastic therapy As diffuse oral thrush. Continue fluconazole and nystatin.  Anemia in neoplastic disease H&H stable    Protein-calorie malnutrition, severe conitnue supplements       Diet:  level II with nectar thick liquids  DVT prophylaxis: sq lovenox   Code Status: full code Family Communication: discussed with wife and son at bedside Disposition Plan: Admit to telemetry. PT eval in a.m.  Louellen Molder [facility] Pager [phone]  Total time spent on admission :60 minutes  If 7PM-7AM, please contact night-coverage [email or web address] Password [facility] 05/02/2014, 6:22 PM

## 2014-05-02 NOTE — ED Notes (Signed)
MD at bedside. 

## 2014-05-02 NOTE — Progress Notes (Signed)
Pt's son called to state concern due to increased weakness noticed in father over the weekend-  He states " dad fell the other night when I wasn't able to be there - he got up at night on his own to use the bathroom and fell"  " he hit the commode going down on his right side "  " he is not able to use his right arm well as well as he seems to want to give out in his legs when he is walking"  This RN asked if pt was able to proceed with scheduled test for today with [name] stating " we are planning on taking him but was hoping to get xrays of his right side due to the fall "  " I didn't think it merited going to the ER over the weekend but Dad does seem to be getting a little weaker"  Plan at present is to obtain R side plain films for pt post test at [facility].

## 2014-05-02 NOTE — ED Notes (Signed)
Pt was at an outpatient speech therapy appointment. Pt was noted to have rt sided weakness, facial droop, and difficulty swallowing. Pt noted to have rt sided facial droop, decreased grip strength and rt leg drift.

## 2014-05-03 DIAGNOSIS — G8191 Hemiplegia, unspecified affecting right dominant side: Secondary | ICD-10-CM | POA: Insufficient documentation

## 2014-05-03 DIAGNOSIS — D630 Anemia in neoplastic disease: Secondary | ICD-10-CM

## 2014-05-03 DIAGNOSIS — E43 Unspecified severe protein-calorie malnutrition: Secondary | ICD-10-CM

## 2014-05-03 DIAGNOSIS — N183 Chronic kidney disease, stage 3 (moderate): Secondary | ICD-10-CM

## 2014-05-03 DIAGNOSIS — R531 Weakness: Secondary | ICD-10-CM

## 2014-05-03 DIAGNOSIS — E119 Type 2 diabetes mellitus without complications: Secondary | ICD-10-CM

## 2014-05-03 DIAGNOSIS — G8190 Hemiplegia, unspecified affecting unspecified side: Secondary | ICD-10-CM

## 2014-05-03 LAB — GLUCOSE, CAPILLARY
Glucose-Capillary: 136 mg/dL — ABNORMAL HIGH (ref 70–99)
Glucose-Capillary: 267 mg/dL — ABNORMAL HIGH (ref 70–99)
Glucose-Capillary: 141 mg/dL — ABNORMAL HIGH (ref 70–99)

## 2014-05-03 LAB — CBC
HCT: 34.3 % — ABNORMAL LOW (ref 39.0–52.0)
HEMOGLOBIN: 11.5 g/dL — AB (ref 13.0–17.0)
MCH: 27.4 pg (ref 26.0–34.0)
MCHC: 33.5 g/dL (ref 30.0–36.0)
MCV: 81.9 fL (ref 78.0–100.0)
Platelets: 306 10*3/uL (ref 150–400)
RBC: 4.19 MIL/uL — AB (ref 4.22–5.81)
RDW: 16.9 % — ABNORMAL HIGH (ref 11.5–15.5)
WBC: 13.1 10*3/uL — ABNORMAL HIGH (ref 4.0–10.5)

## 2014-05-03 LAB — LIPID PANEL
Cholesterol: 111 mg/dL (ref 0–200)
HDL: 38 mg/dL — ABNORMAL LOW (ref >39)
LDL CALC: 51 mg/dL (ref 0–99)
Total CHOL/HDL Ratio: 2.9 RATIO
Triglycerides: 111 mg/dL (ref <150)
VLDL: 22 mg/dL (ref 0–40)

## 2014-05-03 LAB — BASIC METABOLIC PANEL
Anion gap: 9 (ref 5–15)
BUN: 18 mg/dL (ref 6–23)
CHLORIDE: 96 meq/L (ref 96–112)
CO2: 31 mmol/L (ref 19–32)
CREATININE: 0.76 mg/dL (ref 0.50–1.35)
Calcium: 9 mg/dL (ref 8.4–10.5)
GFR calc non Af Amer: 80 mL/min — ABNORMAL LOW (ref >90)
Glucose, Bld: 143 mg/dL — ABNORMAL HIGH (ref 70–99)
Potassium: 3.8 mmol/L (ref 3.5–5.1)
SODIUM: 136 mmol/L (ref 135–145)

## 2014-05-03 MED ORDER — ZOLPIDEM TARTRATE 5 MG PO TABS
5.0000 mg | ORAL_TABLET | Freq: Once | ORAL | Status: AC
  Administered 2014-05-03: 5 mg via ORAL
  Filled 2014-05-03: qty 1

## 2014-05-03 MED ORDER — HYDRALAZINE HCL 20 MG/ML IJ SOLN
10.0000 mg | INTRAMUSCULAR | Status: DC | PRN
  Administered 2014-05-04 – 2014-05-05 (×2): 10 mg via INTRAVENOUS
  Filled 2014-05-03 (×2): qty 1

## 2014-05-03 MED ORDER — INFLUENZA VAC SPLIT QUAD 0.5 ML IM SUSY
0.5000 mL | PREFILLED_SYRINGE | INTRAMUSCULAR | Status: DC
  Filled 2014-05-03: qty 0.5

## 2014-05-03 MED ORDER — ENSURE COMPLETE PO LIQD
237.0000 mL | Freq: Two times a day (BID) | ORAL | Status: DC
  Administered 2014-05-04 – 2014-05-06 (×3): 237 mL via ORAL

## 2014-05-03 MED ORDER — RESOURCE THICKENUP CLEAR PO POWD
ORAL | Status: DC | PRN
  Filled 2014-05-03: qty 125

## 2014-05-03 NOTE — Progress Notes (Signed)
PROGRESS NOTE  Erik Ramirez RKY:[id number] DOB: 09-12-1925 DOA: 05/02/2014 PCP: Pcp Not In System  HPI: 81 year old male with history of diffuse large B-cell lymphoma on chemotherapy, diabetes mellitus, paroxysmal A. fib, hypertension, obstructive sleep apnea was recently hospitalized for neutropenic fever and anemia, elevated troponin due to demand ischemia, and was discharged home with home health. Patient reports that he has been feeling weak since his discharge and not having enough strength in his legs. He fell in his bathroom about 10 days back and says that his legs gave away. He thinks he may have hit his right scapula, hip and right knee and leg. Since then he has been progressively feeling weak and having pain in his right shoulder, hips and the right leg. For past 2-3 days he reports that his right leg feels like "lead ". He denies any syncope, headache or dizziness. He denies any tingling or numbness of his extremities. Denies any bowel or urinary symptoms. Denies any blurred vision. He reports poor appetite and difficulty swallowing due to oral thrush. Patient denies fever, chills, nausea , vomiting, chest pain, palpitations, SOB, abdominal pain, bowel or urinary symptoms. As reported that his right hand has been week for past 1 week. Pt had presented for outpatient swallow eval today and was referred to ED given complains of right-sided weakness.  Subjective / 24 H Interval events Pain better this morning  Assessment/Plan: Principal Problem:   CVA (cerebral vascular accident) Active Problems:   Diabetes mellitus without complication   DLBCL (diffuse large B cell lymphoma)   CKD (chronic kidney disease), stage III   Mucositis due to antineoplastic therapy   Anemia in neoplastic disease   Protein-calorie malnutrition, severe   Fall at home   Weakness generalized  Rt sided weakness - Neurology consulted, appreciate input. MRI negative, pending MRI C spine this morning -  subjectively better - We'll check carotid Dopplers. - PT/OT eval. - Neuro checks per unit protocol.  Right shoulder and hip pain - Secondary to fall. No fractures on XR  DLBCL (diffuse large B cell lymphoma) - Following with Dr. Jana Hakim as outpatient and on chemotherapy.  Diabetes mellitus type 2 continue SSI  CKD (chronic kidney disease), stage III Renal function stable.  Paroxysmal A. Fib Currently in sinus. Continue aspirin and beta blocker  Mucositis due to antineoplastic therapy As diffuse oral thrush. Continue fluconazole and nystatin.  Anemia in neoplastic disease H&H stable  Protein-calorie malnutrition, severe conitnue supplements  HTN - continue home medications, somewhat hypertensive this morning, add hydralazine PRN  Diet: DIET DYS 2 Fluids: none DVT Prophylaxis: Lovenox  Code Status: Full Code Family Communication: none this morning  Disposition Plan: remain inpatient  Consultants:  Neurology   Procedures:  None    Antibiotics  Anti-infectives    Start     Dose/Rate Route Frequency Ordered Stop   05/02/14 1830  fluconazole (DIFLUCAN) tablet 100 mg     100 mg Oral Daily 05/02/14 1821         Studies  Dg Shoulder Right  05/02/2014   CLINICAL DATA:  Pain all over  EXAM: RIGHT SHOULDER - 2+ VIEW  COMPARISON:  None.  FINDINGS: There is no fracture or dislocation. There are mild degenerative changes of the acromioclavicular joint.  IMPRESSION: No acute osseous injury of the right shoulder.   Electronically Signed   By: Kathreen Devoid   On: 05/02/2014 20:18   Ct Head Wo Contrast  05/02/2014   CLINICAL DATA:  Right upper extremity  weakness. Recent diagnosis of lymphoma.  EXAM: CT HEAD WITHOUT CONTRAST  TECHNIQUE: Contiguous axial images were obtained from the base of the skull through the vertex without intravenous contrast.  COMPARISON:  CT scan dated 06/29/2007  FINDINGS: No mass lesion. No midline shift. No acute hemorrhage or hematoma. No extra-axial  fluid collections. No evidence of acute infarction. There is a small old white matter infarct high in the right frontal lobe. There is diffuse slight cerebral cortical atrophy, stable. No ventricular dilatation.  Osseous structures are normal.  IMPRESSION: No acute abnormality. 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   Electronically Signed   By: Rozetta Nunnery M.D.   On: 05/02/2014 14:08   Mr Jodene Nam Head Wo Contrast  05/02/2014   CLINICAL DATA:  Leg weakness. Difficulty walking. History of lymphoma and diabetes and atrial fibrillation.  EXAM: MRI HEAD WITHOUT AND WITH CONTRAST  MRA HEAD WITHOUT CONTRAST  TECHNIQUE: Multiplanar, multiecho pulse sequences of the brain and surrounding structures were obtained without and with intravenous contrast. Angiographic images of the head were obtained using MRA technique without contrast.  CONTRAST:  48mL MULTIHANCE GADOBENATE DIMEGLUMINE 529 MG/ML IV SOLN  COMPARISON:  CT head 05/02/2014  FINDINGS: MRI HEAD FINDINGS  Negative for acute infarct.  Moderate atrophy. Mild chronic microvascular ischemic changes in the cerebral white matter. Basal ganglia and brainstem and cerebellum normal.  Negative for mass or edema.  Normal enhancement following contrast administration. No enhancing mass lesion identified.  MRA HEAD FINDINGS  Both vertebral arteries are patent to the basilar without significant stenosis. Left vertebral artery dominant. PICA, superior cerebellar, posterior cerebral artery is patent bilaterally without stenosis. Basilar widely patent.  Internal carotid artery widely patent bilaterally. Anterior and middle cerebral arteries widely patent without stenosis  Negative for cerebral aneurysm.  IMPRESSION: Atrophy and mild chronic microvascular ischemia.  No acute infarct  Negative MRA head.   Electronically Signed   By: Franchot Gallo M.D.   On: 05/02/2014 20:14   Mr Jeri Cos BT Contrast  05/02/2014   CLINICAL DATA:  Leg weakness. Difficulty walking.  History of lymphoma and diabetes and atrial fibrillation.  EXAM: MRI HEAD WITHOUT AND WITH CONTRAST  MRA HEAD WITHOUT CONTRAST  TECHNIQUE: Multiplanar, multiecho pulse sequences of the brain and surrounding structures were obtained without and with intravenous contrast. Angiographic images of the head were obtained using MRA technique without contrast.  CONTRAST:  79mL MULTIHANCE GADOBENATE DIMEGLUMINE 529 MG/ML IV SOLN  COMPARISON:  CT head 05/02/2014  FINDINGS: MRI HEAD FINDINGS  Negative for acute infarct.  Moderate atrophy. Mild chronic microvascular ischemic changes in the cerebral white matter. Basal ganglia and brainstem and cerebellum normal.  Negative for mass or edema.  Normal enhancement following contrast administration. No enhancing mass lesion identified.  MRA HEAD FINDINGS  Both vertebral arteries are patent to the basilar without significant stenosis. Left vertebral artery dominant. PICA, superior cerebellar, posterior cerebral artery is patent bilaterally without stenosis. Basilar widely patent.  Internal carotid artery widely patent bilaterally. Anterior and middle cerebral arteries widely patent without stenosis  Negative for cerebral aneurysm.  IMPRESSION: Atrophy and mild chronic microvascular ischemia.  No acute infarct  Negative MRA head.   Electronically Signed   By: Franchot Gallo M.D.   On: 05/02/2014 20:14   Dg Op Swallowing Func-medicare/speech Path  05/02/2014   Procedures by Earley Brooke, CCC-SLP at 05/02/2014 1:24 PM    Author: Earley Brooke, CCC-SLP Service: (none) Author Type: Speech  and  Language Pathologist   Filed: 05/02/2014 1:25 PM Note Time: 05/02/2014 1:24 PM Status: Signed   Editor: Leah Meryl McCoy, CCC-SLP (Speech and Language Pathologist)      Procedures   1. SLP MODIFIED BARIUM SWALLOW [LDJ5701 (Custom)]       Expand All Collapse All    Objective Swallowing Evaluation: Modified Barium Swallowing Study  Patient Details  Name: Quinlan Vollmer MRN:  [id number] Date of Birth: 1925-05-15  Today's Date: 05/02/2014 Time: SLP Start Time (ACUTE ONLY): 1135-SLP Stop Time (ACUTE ONLY): 1240 SLP Time Calculation (min) (ACUTE ONLY): 65 min  Past Medical History:  Past Medical History  Diagnosis Date  . Hypertension   . Hyperlipemia   . Anxiety   . Lymphadenopathy, abdominal   . Urethral stricture   . Anemia   . BPPV (benign paroxysmal positional vertigo)   . Lymphoma   . Diabetes mellitus without complication   . Obstructive sleep apnea     uses cpap at home  . Sleep apnea    Past Surgical History:  Past Surgical History  Procedure Laterality Date  . Back surgery    . Prostate surgery    . Tonsillectomy     HPI:  HPI: 81 year old male with h/o HTN, anxiety, lymphoma, sleep apnea, DM,  anemia seen for OP MBS due to c/o dysphagia x 1 week. Patient and son  providing history which includes also within the week, new onset right arm  weakness and numbness, right leg numbness, significant generalized  weakness, severe vocal quality changes, falls. SLP confirms severe  dysphonia (almost aphonic) as well as right sided facial droop.   No Data Recorded  Assessment / Plan / Recommendation CHL IP CLINICAL IMPRESSIONS 05/02/2014  Dysphagia Diagnosis Moderate pharyngeal phase dysphagia;Severe pharyngeal  phase dysphagia  Clinical impression Patient presents with a moderate-severe primarily  pharyngeal dysphagia characterized by base of tongue, laryngeal, and  pharyngeal weakness resulting in decreased pharyngeal clearance of bolus  and decreased airway protection. SEvere pharyngeal residuals remained post  swallow which patient senses, responding with multiple swallows which are  moderately effective to clear. Penetrates noted with nectar thick liquids  clear with either multiple swallows or clinician cueing for throat clear.  Various head postures assessed (chin tuck, right head turn) and  unsuccessful to reduce pharyngeal  residuals or facilitate improved airway  protection. Overall, risk of aspiration high at this time given severity  of deficits, reducing to moderate with strict use of aspiration  precautions which were reviewed in full with patient and son. Given  severity of neuro symptoms suggestive of CVA, recommended rapid MD f/u.  Attempted to contact referring physician, Dr. Jana Hakim, via phone without  response. Recommended patient go to ED given stroke symptoms. Patient and  family in agreement.       CHL IP TREATMENT RECOMMENDATION 05/02/2014  Treatment Plan Recommendations Defer treatment plan to SLP at (Comment)     CHL IP DIET RECOMMENDATION 05/02/2014  Diet Recommendations Dysphagia 2 (Fine chop);Nectar-thick liquid  Liquid Administration via Cup;No straw  Medication Administration Crushed with puree  Compensations Slow rate;Small sips/bites;Multiple dry swallows after each  bite/sip;Clear throat after each swallow  Postural Changes and/or Swallow Maneuvers Seated upright 90 degrees     CHL IP OTHER RECOMMENDATIONS 05/02/2014  Recommended Consults (None)  Oral Care Recommendations Oral care BID  Other Recommendations Prohibited food (jello, ice cream, thin soups)     CHL IP FOLLOW UP RECOMMENDATIONS 05/02/2014  Follow up Recommendations Home health SLP  No flowsheet data found.       SLP Swallow Goals No flowsheet data found.  No flowsheet data found.    CHL IP REASON FOR REFERRAL 05/02/2014  Reason for Referral Objectively evaluate swallowing function     CHL IP ORAL PHASE 05/02/2014  Lips (None)  Tongue (None)  Mucous membranes (None)  Nutritional status (None)  Other (None)  Oxygen therapy (None)  Oral Phase WFL  Oral - Pudding Teaspoon (None)  Oral - Pudding Cup (None)  Oral - Honey Teaspoon (None)  Oral - Honey Cup (None)  Oral - Honey Syringe (None)  Oral - Nectar Teaspoon (None)  Oral - Nectar Cup (None)  Oral - Nectar Straw (None)   Oral - Nectar Syringe (None)  Oral - Ice Chips (None)  Oral - Thin Teaspoon (None)  Oral - Thin Cup (None)  Oral - Thin Straw (None)  Oral - Thin Syringe (None)  Oral - Puree (None)  Oral - Mechanical Soft (None)  Oral - Regular (None)  Oral - Multi-consistency (None)  Oral - Pill (None)  Oral Phase - Comment (None)      CHL IP PHARYNGEAL PHASE 05/02/2014  Pharyngeal Phase Impaired  Pharyngeal - Pudding Teaspoon (None)  Penetration/Aspiration details (pudding teaspoon) (None)  Pharyngeal - Pudding Cup (None)  Penetration/Aspiration details (pudding cup) (None)  Pharyngeal - Honey Teaspoon (None)  Penetration/Aspiration details (honey teaspoon) (None)  Pharyngeal - Honey Cup (None)  Penetration/Aspiration details (honey cup) (None)  Pharyngeal - Honey Syringe (None)  Penetration/Aspiration details (honey syringe) (None)  Pharyngeal - Nectar Teaspoon Delayed swallow initiation;Premature  spillage to pyriform sinuses;Reduced pharyngeal peristalsis;Reduced  epiglottic inversion;Reduced anterior laryngeal mobility;Reduced laryngeal  elevation;Reduced airway/laryngeal closure;Reduced tongue base  retraction;Penetration/Aspiration before swallow;Penetration/Aspiration  during swallow;Pharyngeal residue - valleculae;Pharyngeal residue -  pyriform sinuses;Lateral channel residue;Compensatory strategies attempted  (Comment)  Penetration/Aspiration details (nectar teaspoon) Material enters airway,  CONTACTS cords and not ejected out  Pharyngeal - Nectar Cup Delayed swallow initiation;Premature spillage to  pyriform sinuses;Reduced pharyngeal peristalsis;Reduced epiglottic  inversion;Reduced anterior laryngeal mobility;Reduced laryngeal  elevation;Reduced airway/laryngeal closure;Reduced tongue base  retraction;Penetration/Aspiration before swallow;Penetration/Aspiration  during swallow;Pharyngeal residue - valleculae;Pharyngeal residue -  pyriform sinuses;Lateral channel  residue;Compensatory strategies attempted  (Comment)  Penetration/Aspiration details (nectar cup) Material enters airway,  CONTACTS cords and not ejected out  Pharyngeal - Nectar Straw (None)  Penetration/Aspiration details (nectar straw) (None)  Pharyngeal - Nectar Syringe (None)  Penetration/Aspiration details (nectar syringe) (None)  Pharyngeal - Ice Chips (None)  Penetration/Aspiration details (ice chips) (None)  Pharyngeal - Thin Teaspoon (None)  Penetration/Aspiration details (thin teaspoon) (None)  Pharyngeal - Thin Cup Delayed swallow initiation;Premature spillage to  pyriform sinuses;Reduced pharyngeal peristalsis;Reduced epiglottic  inversion;Reduced anterior laryngeal mobility;Reduced laryngeal  elevation;Reduced airway/laryngeal closure;Reduced tongue base  retraction;Penetration/Aspiration before swallow;Penetration/Aspiration  during swallow;Pharyngeal residue - valleculae;Pharyngeal residue -  pyriform sinuses;Lateral channel residue;Compensatory strategies attempted  (Comment);Moderate aspiration  Penetration/Aspiration details (thin cup) Material enters airway, passes  BELOW cords and not ejected out despite cough attempt by patient  Pharyngeal - Thin Straw (None)  Penetration/Aspiration details (thin straw) (None)  Pharyngeal - Thin Syringe (None)  Penetration/Aspiration details (thin syringe') (None)  Pharyngeal - Puree Delayed swallow initiation;Premature spillage to  pyriform sinuses;Reduced pharyngeal peristalsis;Reduced epiglottic  inversion;Reduced anterior laryngeal mobility;Reduced laryngeal  elevation;Reduced airway/laryngeal closure;Reduced tongue base  retraction;Pharyngeal residue - valleculae;Pharyngeal residue - pyriform  sinuses;Lateral channel residue;Compensatory strategies attempted  (Comment)  Penetration/Aspiration details (puree) Material does not enter airway  Pharyngeal - Mechanical Soft Delayed swallow initiation;Premature  spillage to  pyriform sinuses;Reduced pharyngeal peristalsis;Reduced  epiglottic inversion;Reduced anterior laryngeal mobility;Reduced laryngeal  elevation;Reduced airway/laryngeal closure;Reduced tongue base  retraction;Pharyngeal residue - valleculae;Pharyngeal residue - pyriform  sinuses;Lateral channel residue;Compensatory strategies attempted  (Comment)  Penetration/Aspiration details (mechanical soft) Material does not enter  airway  Pharyngeal - Regular (None)  Penetration/Aspiration details (regular) (None)  Pharyngeal - Multi-consistency (None)  Penetration/Aspiration details (multi-consistency) (None)  Pharyngeal - Pill (None)  Penetration/Aspiration details (pill) (None)  Pharyngeal Comment (None)     CHL IP CERVICAL ESOPHAGEAL PHASE 05/02/2014  Cervical Esophageal Phase Impaired  Pudding Teaspoon (None)  Pudding Cup (None)  Honey Teaspoon (None)  Honey Cup (None)  Honey Syringe (None)  Nectar Teaspoon (None)  Nectar Cup (None)  Nectar Straw (None)  Nectar Syringe (None)  Thin Teaspoon (None)  Thin Cup (None)  Thin Straw (None)  Thin Syringe (None)  Cervical Esophageal Comment decreased UES relaxation    CHL IP GO 05/02/2014  Functional Assessment Tool Used skilled clinical judgement  Functional Limitations Swallowing  Swallow Current Status (K5625) CL  Swallow Goal Status (W3893) CL  Swallow Discharge Status (T3428) CL  Motor Speech Current Status (J6811) (None)  Motor Speech Goal Status (X7262) (None)  Motor Speech Goal Status (M3559) (None)  Spoken Language Comprehension Current Status (R4163) (None)  Spoken Language Comprehension Goal Status (A4536) (None)  Spoken Language Comprehension Discharge Status (I6803) (None)  Spoken Language Expression Current Status (O1224) (None)  Spoken Language Expression Goal Status (M2500) (None)  Spoken Language Expression Discharge Status (B7048) (None)  Attention Current Status (G8916) (None)   Attention Goal Status (X4503) (None)  Attention Discharge Status (U8828) (None)  Memory Current Status (M0349) (None)  Memory Goal Status (Z7915) (None)  Memory Discharge Status (A5697) (None)  Voice Current Status (X4801) (None)  Voice Goal Status (K5537) (None)  Voice Discharge Status (S8270) (None)  Other Speech-Language Pathology Functional Limitation (B8675) (None)  Other Speech-Language Pathology Functional Limitation Goal Status (Q4920)  (None)  Other Speech-Language Pathology Functional Limitation Discharge Status  [phone]) (None)   Gabriel Rainwater MA, CCC-SLP [phone]         McCoy Leah Meryl 05/02/2014, 1:24 PM            05/02/2014   CLINICAL DATA: swallowing difficulties   FLUOROSCOPY FOR SWALLOWING FUNCTION STUDY:  Fluoroscopy was provided for swallowing function study, which was  administered by a speech pathologist.  Final results and recommendations  from this study are contained within the speech pathology report.    Dg Hips Bilat With Pelvis 2v  05/02/2014   CLINICAL DATA:  Fall.  Pain.  EXAM: DG HIP W/ PELVIS 2V BILAT  COMPARISON:  None.  FINDINGS: No acute fracture. No dislocation. Mild osteopenia. Minimal degenerative change of the right hip joint.  IMPRESSION: No acute bony pathology.   Electronically Signed   By: Maryclare Bean M.D.   On: 05/02/2014 20:14    Objective  Filed Vitals:   05/03/14 0000 05/03/14 0200 05/03/14 0401 05/03/14 0600  BP: 166/111 182/125 180/95 200/86  Pulse: 80  82 85  Temp: 98.1 F (36.7 C) 98.1 F (36.7 C) 98.1 F (36.7 C) 98.1 F (36.7 C)  TempSrc: Oral Oral Oral Oral  Resp: 16 14 16 16   SpO2: 96% 97% 98% 95%   Exam:  General:  NAD  HEENT: no scleral icterus  Cardiovascular: RRR without MRG  Respiratory: CTA biL, no wheezing  Abdomen: soft, non tender  MSK/Extremities: no clubbing or cyanosis   Skin: no rashes  Neuro: minimal decrease in  strength right arm  Data Reviewed: Basic Metabolic Panel:  Recent  Labs Lab 05/02/14 1316 05/02/14 1329 05/03/14 0559  NA 135 134* 136  K 4.8 4.6 3.8  CL 94* 93* 96  CO2 30  --  31  GLUCOSE 188* 188* 143*  BUN 24* 29* 18  CREATININE 0.80 0.80 0.76  CALCIUM 9.1  --  9.0   Liver Function Tests:  Recent Labs Lab 05/02/14 1316  AST 25  ALT 14  ALKPHOS 72  BILITOT 0.5  PROT 6.2  ALBUMIN 2.6*   CBC:  Recent Labs Lab 05/02/14 1316 05/02/14 1329 05/03/14 0559  WBC 14.5*  --  13.1*  NEUTROABS 12.1*  --   --   HGB 12.5* 13.9 11.5*  HCT 37.1* 41.0 34.3*  MCV 82.8  --  81.9  PLT 303  --  306   BNP (last 3 results)  Recent Labs  01/05/14 2142  PROBNP 343.7   CBG:  Recent Labs Lab 05/02/14 1315 05/02/14 1830 05/02/14 2141 05/03/14 0644  GLUCAP 194* 112* 170* 136*   Scheduled Meds: . allopurinol  300 mg Oral Daily  . antiseptic oral rinse  7 mL Mouth Rinse q12n4p  . aspirin  325 mg Oral Daily  . atorvastatin  10 mg Oral q1800  . chlorhexidine  15 mL Mouth Rinse BID  . diltiazem  120 mg Oral Daily  . docusate sodium  100 mg Oral BID  . enoxaparin (LOVENOX) injection  40 mg Subcutaneous Q24H  . feeding supplement (ENSURE COMPLETE)  237 mL Oral Q24H  . fluconazole  100 mg Oral Daily  . [START ON 05/04/2014] Influenza vac split quadrivalent PF  0.5 mL Intramuscular Tomorrow-1000  . insulin aspart  0-15 Units Subcutaneous TID WC  . insulin aspart  0-5 Units Subcutaneous QHS  . loratadine  10 mg Oral BH-q7a  . metoprolol  25 mg Oral BID  . miconazole  1 application Topical BID  . nystatin  5 mL Oral QID  . ondansetron  8 mg Oral Q12H  . polyethylene glycol  17 g Oral Daily  . predniSONE  10 mg Oral Q breakfast  . sodium chloride  3 mL Intravenous Q12H  . sucralfate  1 g Oral TID WC & HS   Continuous Infusions: . sodium chloride 75 mL/hr at 05/02/14 2034    Marzetta Board, MD [facility] Pager [phone]. If 7 PM - 7 AM, please contact night-coverage at [email or web address], password [facility] 05/03/2014, 8:42 AM  LOS: 1 day

## 2014-05-03 NOTE — Progress Notes (Signed)
Patient placed on CPAP via [facility] auto titrate settings.  Tolerating well at this time.

## 2014-05-03 NOTE — Progress Notes (Signed)
Subjective: Patient reports that he continues to feel weak, particularly on the right.  There has been no worsening.  Objective: Current vital signs: BP 178/88 mmHg  Pulse 84  Temp(Src) 97.8 F (36.6 C) (Oral)  Resp 16  SpO2 93% Vital signs in last 24 hours: Temp:  [97.8 F (36.6 C)-98.6 F (37 C)] 97.8 F (36.6 C) (01/19 0928) Pulse Rate:  [39-95] 84 (01/19 0928) Resp:  [13-24] 16 (01/19 0928) BP: (136-200)/(57-125) 178/88 mmHg (01/19 0928) SpO2:  [93 %-99 %] 93 % (01/19 0928)  Intake/Output from previous day:   Intake/Output this shift: Total I/O In: 200 [P.O.:200] Out: -  Nutritional status: DIET - DYS 1  Neurologic Exam: Mental Status: Alert, oriented, thought content appropriate.  Speech soft but fluent.  Able to follow 3 step commands without difficulty. Cranial Nerves: II: Discs flat bilaterally; Visual fields grossly normal, pupils equal, round, reactive to light and accommodation III,IV, VI: ptosis not present, extra-ocular motions intact bilaterally V,VII: facial bone structure asymmetry with smile symmetric, facial light touch sensation normal bilaterally VIII: hearing normal bilaterally IX,X: gag reflex present XI: bilateral shoulder shrug XII: midline tongue extension Motor: Patient exhibits generalized weakness but when arms lifted together has a drift of the RUE without pronation.  Lower extremity testing reveals a positive Hoovers.   Sensory: Pinprick and light touch intact throughout, bilaterally Deep Tendon Reflexes: 1+ and symmetric throughout Plantars: Right: downgoing   Left: downgoing   Lab Results: Basic Metabolic Panel:  Recent Labs Lab 05/02/14 1316 05/02/14 1329 05/03/14 0559  NA 135 134* 136  K 4.8 4.6 3.8  CL 94* 93* 96  CO2 30  --  31  GLUCOSE 188* 188* 143*  BUN 24* 29* 18  CREATININE 0.80 0.80 0.76  CALCIUM 9.1  --  9.0    Liver Function Tests:  Recent Labs Lab 05/02/14 1316  AST 25  ALT 14  ALKPHOS 72  BILITOT  0.5  PROT 6.2  ALBUMIN 2.6*   No results for input(s): LIPASE, AMYLASE in the last 168 hours. No results for input(s): AMMONIA in the last 168 hours.  CBC:  Recent Labs Lab 05/02/14 1316 05/02/14 1329 05/03/14 0559  WBC 14.5*  --  13.1*  NEUTROABS 12.1*  --   --   HGB 12.5* 13.9 11.5*  HCT 37.1* 41.0 34.3*  MCV 82.8  --  81.9  PLT 303  --  306    Cardiac Enzymes: No results for input(s): CKTOTAL, CKMB, CKMBINDEX, TROPONINI in the last 168 hours.  Lipid Panel:  Recent Labs Lab 05/03/14 0559  CHOL 111  TRIG 111  HDL 38*  CHOLHDL 2.9  VLDL 22  LDLCALC 51    CBG:  Recent Labs Lab 05/02/14 1315 05/02/14 1830 05/02/14 2141 05/03/14 0644  GLUCAP 194* 112* 170* 136*    Microbiology: Results for orders placed or performed during the hospital encounter of 04/12/14  Blood Culture (routine x 2)     Status: None   Collection Time: 04/12/14  6:34 PM  Result Value Ref Range Status   Specimen Description BLOOD PAC  Final   Special Requests BOTTLES DRAWN AEROBIC AND ANAEROBIC 5CC  Final   Culture   Final    NO GROWTH 5 DAYS Performed at [facility]    Report Status 04/19/2014 FINAL  Final  Blood Culture (routine x 2)     Status: None   Collection Time: 04/12/14  7:24 PM  Result Value Ref Range Status   Specimen Description  BLOOD RIGHT ANTECUBITAL  Final   Special Requests BOTTLES DRAWN AEROBIC AND ANAEROBIC 5CC  Final   Culture   Final    NO GROWTH 5 DAYS Performed at [facility]    Report Status 04/19/2014 FINAL  Final  Urine culture     Status: None   Collection Time: 04/12/14  9:17 PM  Result Value Ref Range Status   Specimen Description URINE, CLEAN CATCH  Final   Special Requests NONE  Final   Colony Count NO GROWTH Performed at [facility]   Final   Culture NO GROWTH Performed at [facility]   Final   Report Status 04/13/2014 FINAL  Final  Rapid strep screen     Status: None   Collection Time: 04/12/14  9:23  PM  Result Value Ref Range Status   Streptococcus, Group A Screen (Direct) NEGATIVE NEGATIVE Final    Comment: (NOTE) A Rapid Antigen test may result negative if the antigen level in the sample is below the detection level of this test. The FDA has not cleared this test as a stand-alone test therefore the rapid antigen negative result has reflexed to a Group A Strep culture.   Culture, Group A Strep     Status: None   Collection Time: 04/12/14  9:23 PM  Result Value Ref Range Status   Specimen Description THROAT  Final   Special Requests NONE  Final   Culture   Final    No Beta Hemolytic Streptococci Isolated Performed at [facility]    Report Status 04/14/2014 FINAL  Final  Respiratory virus panel (routine influenza)     Status: None   Collection Time: 04/13/14  2:14 AM  Result Value Ref Range Status   Source - [facility] NASAL WASHINGS  Corrected    Comment: CORRECTED ON 12/30 AT 1834: PREVIOUSLY REPORTED AS NASAL WASHINGS   Respiratory Syncytial Virus A NOT DETECTED  Final   Respiratory Syncytial Virus B NOT DETECTED  Final   Influenza A NOT DETECTED  Final   Influenza B NOT DETECTED  Final   Parainfluenza 1 NOT DETECTED  Final   Parainfluenza 2 NOT DETECTED  Final   Parainfluenza 3 NOT DETECTED  Final   Metapneumovirus NOT DETECTED  Final   Rhinovirus NOT DETECTED  Final   Adenovirus NOT DETECTED  Final   Influenza A H1 NOT DETECTED  Final   Influenza A H3 NOT DETECTED  Final    Comment: (NOTE)       Normal Reference Range for each Analyte: NOT DETECTED Testing performed using the Luminex xTAG Respiratory Viral Panel test kit. The analytical performance characteristics of this assay have been determined by [facility].  The modifications have not been cleared or approved by the FDA. This assay has been validated pursuant to the CLIA regulations and is used for clinical purposes. Performed at [facility]     Coagulation Studies:  Recent  Labs  05/02/14 1316  LABPROT 14.0  INR 1.07    Imaging: Dg Shoulder Right  05/02/2014   CLINICAL DATA:  Pain all over  EXAM: RIGHT SHOULDER - 2+ VIEW  COMPARISON:  None.  FINDINGS: There is no fracture or dislocation. There are mild degenerative changes of the acromioclavicular joint.  IMPRESSION: No acute osseous injury of the right shoulder.   Electronically Signed   By: Kathreen Devoid   On: 05/02/2014 20:18   Ct Head Wo Contrast  05/02/2014   CLINICAL DATA:  Right upper extremity weakness. Recent diagnosis of lymphoma.  EXAM: CT HEAD WITHOUT CONTRAST  TECHNIQUE: Contiguous axial images were obtained from the base of the skull through the vertex without intravenous contrast.  COMPARISON:  CT scan dated 06/29/2007  FINDINGS: No mass lesion. No midline shift. No acute hemorrhage or hematoma. No extra-axial fluid collections. No evidence of acute infarction. There is a small old white matter infarct high in the right frontal lobe. There is diffuse slight cerebral cortical atrophy, stable. No ventricular dilatation.  Osseous structures are normal.  IMPRESSION: No acute abnormality. 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   Electronically Signed   By: Rozetta Nunnery M.D.   On: 05/02/2014 14:08   Mr Jodene Nam Head Wo Contrast  05/02/2014   CLINICAL DATA:  Leg weakness. Difficulty walking. History of lymphoma and diabetes and atrial fibrillation.  EXAM: MRI HEAD WITHOUT AND WITH CONTRAST  MRA HEAD WITHOUT CONTRAST  TECHNIQUE: Multiplanar, multiecho pulse sequences of the brain and surrounding structures were obtained without and with intravenous contrast. Angiographic images of the head were obtained using MRA technique without contrast.  CONTRAST:  96m MULTIHANCE GADOBENATE DIMEGLUMINE 529 MG/ML IV SOLN  COMPARISON:  CT head 05/02/2014  FINDINGS: MRI HEAD FINDINGS  Negative for acute infarct.  Moderate atrophy. Mild chronic microvascular ischemic changes in the cerebral white matter. Basal  ganglia and brainstem and cerebellum normal.  Negative for mass or edema.  Normal enhancement following contrast administration. No enhancing mass lesion identified.  MRA HEAD FINDINGS  Both vertebral arteries are patent to the basilar without significant stenosis. Left vertebral artery dominant. PICA, superior cerebellar, posterior cerebral artery is patent bilaterally without stenosis. Basilar widely patent.  Internal carotid artery widely patent bilaterally. Anterior and middle cerebral arteries widely patent without stenosis  Negative for cerebral aneurysm.  IMPRESSION: Atrophy and mild chronic microvascular ischemia.  No acute infarct  Negative MRA head.   Electronically Signed   By: CFranchot GalloM.D.   On: 05/02/2014 20:14   Mr BJeri CosWZPContrast  05/02/2014   CLINICAL DATA:  Leg weakness. Difficulty walking. History of lymphoma and diabetes and atrial fibrillation.  EXAM: MRI HEAD WITHOUT AND WITH CONTRAST  MRA HEAD WITHOUT CONTRAST  TECHNIQUE: Multiplanar, multiecho pulse sequences of the brain and surrounding structures were obtained without and with intravenous contrast. Angiographic images of the head were obtained using MRA technique without contrast.  CONTRAST:  142mMULTIHANCE GADOBENATE DIMEGLUMINE 529 MG/ML IV SOLN  COMPARISON:  CT head 05/02/2014  FINDINGS: MRI HEAD FINDINGS  Negative for acute infarct.  Moderate atrophy. Mild chronic microvascular ischemic changes in the cerebral white matter. Basal ganglia and brainstem and cerebellum normal.  Negative for mass or edema.  Normal enhancement following contrast administration. No enhancing mass lesion identified.  MRA HEAD FINDINGS  Both vertebral arteries are patent to the basilar without significant stenosis. Left vertebral artery dominant. PICA, superior cerebellar, posterior cerebral artery is patent bilaterally without stenosis. Basilar widely patent.  Internal carotid artery widely patent bilaterally. Anterior and middle cerebral  arteries widely patent without stenosis  Negative for cerebral aneurysm.  IMPRESSION: Atrophy and mild chronic microvascular ischemia.  No acute infarct  Negative MRA head.   Electronically Signed   By: ChFranchot Gallo.D.   On: 05/02/2014 20:14   Dg Op Swallowing Func-medicare/speech Path  05/02/2014   Procedures by LeEarley BrookeCCC-SLP at 05/02/2014 1:24 PM    Author: LeEarley BrookeCCC-SLP Service: (none) Author Type:  Speech  and Language Pathologist   Filed: 05/02/2014 1:25 PM Note Time: 05/02/2014 1:24 PM Status: Signed   Editor: Leah Meryl McCoy, CCC-SLP (Speech and Language Pathologist)      Procedures   1. SLP MODIFIED BARIUM SWALLOW [CWC3762 (Custom)]       Expand All Collapse All    Objective Swallowing Evaluation: Modified Barium Swallowing Study  Patient Details  Name: Erik Ramirez MRN: [id number] Date of Birth: Nov 10, 1925  Today's Date: 05/02/2014 Time: SLP Start Time (ACUTE ONLY): 1135-SLP Stop Time (ACUTE ONLY): 1240 SLP Time Calculation (min) (ACUTE ONLY): 65 min  Past Medical History:  Past Medical History  Diagnosis Date  . Hypertension   . Hyperlipemia   . Anxiety   . Lymphadenopathy, abdominal   . Urethral stricture   . Anemia   . BPPV (benign paroxysmal positional vertigo)   . Lymphoma   . Diabetes mellitus without complication   . Obstructive sleep apnea     uses cpap at home  . Sleep apnea    Past Surgical History:  Past Surgical History  Procedure Laterality Date  . Back surgery    . Prostate surgery    . Tonsillectomy     HPI:  HPI: 81 year old male with h/o HTN, anxiety, lymphoma, sleep apnea, DM,  anemia seen for OP MBS due to c/o dysphagia x 1 week. Patient and son  providing history which includes also within the week, new onset right arm  weakness and numbness, right leg numbness, significant generalized  weakness, severe vocal quality changes, falls. SLP confirms severe  dysphonia (almost aphonic) as well as  right sided facial droop.   No Data Recorded  Assessment / Plan / Recommendation CHL IP CLINICAL IMPRESSIONS 05/02/2014  Dysphagia Diagnosis Moderate pharyngeal phase dysphagia;Severe pharyngeal  phase dysphagia  Clinical impression Patient presents with a moderate-severe primarily  pharyngeal dysphagia characterized by base of tongue, laryngeal, and  pharyngeal weakness resulting in decreased pharyngeal clearance of bolus  and decreased airway protection. SEvere pharyngeal residuals remained post  swallow which patient senses, responding with multiple swallows which are  moderately effective to clear. Penetrates noted with nectar thick liquids  clear with either multiple swallows or clinician cueing for throat clear.  Various head postures assessed (chin tuck, right head turn) and  unsuccessful to reduce pharyngeal residuals or facilitate improved airway  protection. Overall, risk of aspiration high at this time given severity  of deficits, reducing to moderate with strict use of aspiration  precautions which were reviewed in full with patient and son. Given  severity of neuro symptoms suggestive of CVA, recommended rapid MD f/u.  Attempted to contact referring physician, Dr. Jana Hakim, via phone without  response. Recommended patient go to ED given stroke symptoms. Patient and  family in agreement.       CHL IP TREATMENT RECOMMENDATION 05/02/2014  Treatment Plan Recommendations Defer treatment plan to SLP at (Comment)     CHL IP DIET RECOMMENDATION 05/02/2014  Diet Recommendations Dysphagia 2 (Fine chop);Nectar-thick liquid  Liquid Administration via Cup;No straw  Medication Administration Crushed with puree  Compensations Slow rate;Small sips/bites;Multiple dry swallows after each  bite/sip;Clear throat after each swallow  Postural Changes and/or Swallow Maneuvers Seated upright 90 degrees     CHL IP OTHER RECOMMENDATIONS 05/02/2014  Recommended Consults (None)  Oral Care  Recommendations Oral care BID  Other Recommendations Prohibited food (jello, ice cream, thin soups)     CHL IP FOLLOW UP RECOMMENDATIONS 05/02/2014  Follow up Recommendations Home health  SLP     No flowsheet data found.       SLP Swallow Goals No flowsheet data found.  No flowsheet data found.    CHL IP REASON FOR REFERRAL 05/02/2014  Reason for Referral Objectively evaluate swallowing function     CHL IP ORAL PHASE 05/02/2014  Lips (None)  Tongue (None)  Mucous membranes (None)  Nutritional status (None)  Other (None)  Oxygen therapy (None)  Oral Phase WFL  Oral - Pudding Teaspoon (None)  Oral - Pudding Cup (None)  Oral - Honey Teaspoon (None)  Oral - Honey Cup (None)  Oral - Honey Syringe (None)  Oral - Nectar Teaspoon (None)  Oral - Nectar Cup (None)  Oral - Nectar Straw (None)  Oral - Nectar Syringe (None)  Oral - Ice Chips (None)  Oral - Thin Teaspoon (None)  Oral - Thin Cup (None)  Oral - Thin Straw (None)  Oral - Thin Syringe (None)  Oral - Puree (None)  Oral - Mechanical Soft (None)  Oral - Regular (None)  Oral - Multi-consistency (None)  Oral - Pill (None)  Oral Phase - Comment (None)      CHL IP PHARYNGEAL PHASE 05/02/2014  Pharyngeal Phase Impaired  Pharyngeal - Pudding Teaspoon (None)  Penetration/Aspiration details (pudding teaspoon) (None)  Pharyngeal - Pudding Cup (None)  Penetration/Aspiration details (pudding cup) (None)  Pharyngeal - Honey Teaspoon (None)  Penetration/Aspiration details (honey teaspoon) (None)  Pharyngeal - Honey Cup (None)  Penetration/Aspiration details (honey cup) (None)  Pharyngeal - Honey Syringe (None)  Penetration/Aspiration details (honey syringe) (None)  Pharyngeal - Nectar Teaspoon Delayed swallow initiation;Premature  spillage to pyriform sinuses;Reduced pharyngeal peristalsis;Reduced  epiglottic inversion;Reduced anterior laryngeal mobility;Reduced laryngeal   elevation;Reduced airway/laryngeal closure;Reduced tongue base  retraction;Penetration/Aspiration before swallow;Penetration/Aspiration  during swallow;Pharyngeal residue - valleculae;Pharyngeal residue -  pyriform sinuses;Lateral channel residue;Compensatory strategies attempted  (Comment)  Penetration/Aspiration details (nectar teaspoon) Material enters airway,  CONTACTS cords and not ejected out  Pharyngeal - Nectar Cup Delayed swallow initiation;Premature spillage to  pyriform sinuses;Reduced pharyngeal peristalsis;Reduced epiglottic  inversion;Reduced anterior laryngeal mobility;Reduced laryngeal  elevation;Reduced airway/laryngeal closure;Reduced tongue base  retraction;Penetration/Aspiration before swallow;Penetration/Aspiration  during swallow;Pharyngeal residue - valleculae;Pharyngeal residue -  pyriform sinuses;Lateral channel residue;Compensatory strategies attempted  (Comment)  Penetration/Aspiration details (nectar cup) Material enters airway,  CONTACTS cords and not ejected out  Pharyngeal - Nectar Straw (None)  Penetration/Aspiration details (nectar straw) (None)  Pharyngeal - Nectar Syringe (None)  Penetration/Aspiration details (nectar syringe) (None)  Pharyngeal - Ice Chips (None)  Penetration/Aspiration details (ice chips) (None)  Pharyngeal - Thin Teaspoon (None)  Penetration/Aspiration details (thin teaspoon) (None)  Pharyngeal - Thin Cup Delayed swallow initiation;Premature spillage to  pyriform sinuses;Reduced pharyngeal peristalsis;Reduced epiglottic  inversion;Reduced anterior laryngeal mobility;Reduced laryngeal  elevation;Reduced airway/laryngeal closure;Reduced tongue base  retraction;Penetration/Aspiration before swallow;Penetration/Aspiration  during swallow;Pharyngeal residue - valleculae;Pharyngeal residue -  pyriform sinuses;Lateral channel residue;Compensatory strategies attempted  (Comment);Moderate aspiration  Penetration/Aspiration details (thin cup)  Material enters airway, passes  BELOW cords and not ejected out despite cough attempt by patient  Pharyngeal - Thin Straw (None)  Penetration/Aspiration details (thin straw) (None)  Pharyngeal - Thin Syringe (None)  Penetration/Aspiration details (thin syringe') (None)  Pharyngeal - Puree Delayed swallow initiation;Premature spillage to  pyriform sinuses;Reduced pharyngeal peristalsis;Reduced epiglottic  inversion;Reduced anterior laryngeal mobility;Reduced laryngeal  elevation;Reduced airway/laryngeal closure;Reduced tongue base  retraction;Pharyngeal residue - valleculae;Pharyngeal residue - pyriform  sinuses;Lateral channel residue;Compensatory strategies attempted  (Comment)  Penetration/Aspiration details (puree) Material does not enter airway  Pharyngeal - Mechanical Soft Delayed swallow  initiation;Premature  spillage to pyriform sinuses;Reduced pharyngeal peristalsis;Reduced  epiglottic inversion;Reduced anterior laryngeal mobility;Reduced laryngeal  elevation;Reduced airway/laryngeal closure;Reduced tongue base  retraction;Pharyngeal residue - valleculae;Pharyngeal residue - pyriform  sinuses;Lateral channel residue;Compensatory strategies attempted  (Comment)  Penetration/Aspiration details (mechanical soft) Material does not enter  airway  Pharyngeal - Regular (None)  Penetration/Aspiration details (regular) (None)  Pharyngeal - Multi-consistency (None)  Penetration/Aspiration details (multi-consistency) (None)  Pharyngeal - Pill (None)  Penetration/Aspiration details (pill) (None)  Pharyngeal Comment (None)     CHL IP CERVICAL ESOPHAGEAL PHASE 05/02/2014  Cervical Esophageal Phase Impaired  Pudding Teaspoon (None)  Pudding Cup (None)  Honey Teaspoon (None)  Honey Cup (None)  Honey Syringe (None)  Nectar Teaspoon (None)  Nectar Cup (None)  Nectar Straw (None)  Nectar Syringe (None)  Thin Teaspoon (None)  Thin Cup (None)  Thin Straw (None)  Thin  Syringe (None)  Cervical Esophageal Comment decreased UES relaxation    CHL IP GO 05/02/2014  Functional Assessment Tool Used skilled clinical judgement  Functional Limitations Swallowing  Swallow Current Status (W4665) CL  Swallow Goal Status (L9357) CL  Swallow Discharge Status (S1779) CL  Motor Speech Current Status (T9030) (None)  Motor Speech Goal Status (S9233) (None)  Motor Speech Goal Status (A0762) (None)  Spoken Language Comprehension Current Status (U6333) (None)  Spoken Language Comprehension Goal Status (L4562) (None)  Spoken Language Comprehension Discharge Status (B6389) (None)  Spoken Language Expression Current Status (H7342) (None)  Spoken Language Expression Goal Status (A7681) (None)  Spoken Language Expression Discharge Status (L5726) (None)  Attention Current Status (O0355) (None)  Attention Goal Status (H7416) (None)  Attention Discharge Status (L8453) (None)  Memory Current Status (M4680) (None)  Memory Goal Status (H2122) (None)  Memory Discharge Status (Q8250) (None)  Voice Current Status (I3704) (None)  Voice Goal Status (U8891) (None)  Voice Discharge Status (Q9450) (None)  Other Speech-Language Pathology Functional Limitation (T8882) (None)  Other Speech-Language Pathology Functional Limitation Goal Status (C0034)  (None)  Other Speech-Language Pathology Functional Limitation Discharge Status  [phone]) (None)   Gabriel Rainwater MA, CCC-SLP [phone]         McCoy Leah Meryl 05/02/2014, 1:24 PM            05/02/2014   CLINICAL DATA: swallowing difficulties   FLUOROSCOPY FOR SWALLOWING FUNCTION STUDY:  Fluoroscopy was provided for swallowing function study, which was  administered by a speech pathologist.  Final results and recommendations  from this study are contained within the speech pathology report.    Dg Hips Bilat With Pelvis 2v  05/02/2014   CLINICAL DATA:  Fall.  Pain.  EXAM: DG HIP W/ PELVIS 2V BILAT  COMPARISON:   None.  FINDINGS: No acute fracture. No dislocation. Mild osteopenia. Minimal degenerative change of the right hip joint.  IMPRESSION: No acute bony pathology.   Electronically Signed   By: Maryclare Bean M.D.   On: 05/02/2014 20:14    Medications:  I have reviewed the patient's current medications. Scheduled: . allopurinol  300 mg Oral Daily  . antiseptic oral rinse  7 mL Mouth Rinse q12n4p  . aspirin  325 mg Oral Daily  . atorvastatin  10 mg Oral q1800  . chlorhexidine  15 mL Mouth Rinse BID  . diltiazem  120 mg Oral Daily  . docusate sodium  100 mg Oral BID  . enoxaparin (LOVENOX) injection  40 mg Subcutaneous Q24H  . feeding supplement (ENSURE COMPLETE)  237 mL Oral Q24H  . fluconazole  100 mg Oral Daily  . [  START ON 05/04/2014] Influenza vac split quadrivalent PF  0.5 mL Intramuscular Tomorrow-1000  . insulin aspart  0-15 Units Subcutaneous TID WC  . insulin aspart  0-5 Units Subcutaneous QHS  . loratadine  10 mg Oral BH-q7a  . metoprolol  25 mg Oral BID  . miconazole  1 application Topical BID  . nystatin  5 mL Oral QID  . ondansetron  8 mg Oral Q12H  . polyethylene glycol  17 g Oral Daily  . predniSONE  10 mg Oral Q breakfast  . sodium chloride  3 mL Intravenous Q12H  . sucralfate  1 g Oral TID WC & HS    Assessment/Plan: Patient reports continued weakness.  Unclear from neurological examination if there is really a significant right sided weakness when compared to the left.  There are multiple functional features on examination.  MRI of the brain personally reviewed and shows no acute changes.    Recommendations: 1.  MRI of the cervical spine pending   LOS: 1 day   Alexis Goodell, MD [facility] [phone] 05/03/2014  2:41 PM

## 2014-05-03 NOTE — Progress Notes (Signed)
[facility]  Patient Status: Active (receiving services up to time of hospitalization)  [facility] is providing the following services: PT, OT, ST, MSW and HHA  If patient discharges after hours, please call [phone].   Consepcion Hearing 05/03/2014, 10:09 AM

## 2014-05-03 NOTE — Evaluation (Signed)
Physical Therapy Evaluation Patient Details Name: Erik Ramirez MRN: [id number] DOB: 1925/10/19 Today's Date: 05/03/2014   History of Present Illness  Adm 05/02/14 with Rt sided weakness. Pt with multiple falls since d/c home from hospital early January (last fell 1/10 in bathroom, per son) Xrays Rt shoulder, pelvis/hips negative; MRI brain negative. MRI cervical spine pending PMHx- FTT post chemotherapy treatments for lymphoma, DM, afib, HTN, BPPV  Clinical Impression  Pt admitted with Rt sided weakness. Pt has had recent decline in function/strength related to his cancer treatments. Has had multiple falls. Discussed with patient that he currently will need to function at a wheelchair level as he works to rebuild his strength. Family has been very supportive and ultimate goal is return home. Pt currently with functional limitations due to the deficits listed below (see PT Problem List).  Pt will benefit from skilled PT to increase their independence and safety with mobility to allow discharge to the venue listed below.       Follow Up Recommendations CIR;Supervision/Assistance - 24 hour    Equipment Recommendations  None recommended by PT    Recommendations for Other Services OT consult     Precautions / Restrictions Precautions Precautions: Fall      Mobility  Bed Mobility Overal bed mobility: Needs Assistance Bed Mobility: Rolling;Sidelying to Sit;Sit to Sidelying Rolling: Supervision Sidelying to sit: Min assist     Sit to sidelying: Min guard General bed mobility comments: with rail; vc for technique; assist due to weakness  Transfers                 General transfer comment: deferred due to LE weakness and recent falls (will need +2 for safety)  Ambulation/Gait                Stairs            Wheelchair Mobility    Modified Rankin (Stroke Patients Only) Modified Rankin (Stroke Patients Only) Pre-Morbid Rankin Score: Moderately severe  disability Modified Rankin: Severe disability     Balance Overall balance assessment: Needs assistance;History of Falls Sitting-balance support: Single extremity supported;Feet unsupported Sitting balance-Leahy Scale: Poor Sitting balance - Comments: able to support himself with LUE only x 5 seconds; sat EOB with minguard to min assist x 7 minutes                                     Pertinent Vitals/Pain Pain Assessment: 0-10 Pain Score: 3  Pain Location: back Pain Intervention(s): Limited activity within patient's tolerance;Monitored during session;Repositioned    Home Living Family/patient expects to be discharged to:: Private residence Living Arrangements: Spouse/significant other Available Help at Discharge: Family;Available 24 hours/day (sons live nearby; one retired) Type of Home: House Home Access: Level entry     [place]: [place]: Environmental consultant - 2 wheels;Cane - single point;Grab bars - tub/shower;Wheelchair - manual      Prior Function Level of Independence: Needs assistance   Gait / Transfers Assistance Needed: only walking with HHPT or his sons due to frequent Lt knee buckling and near falls (32 yo wife cannot safely assist him--she calls her sons to help pt to transfer each time); 1 son carries pt up/down 2 interior steps           Hand Dominance   Dominant Hand: Right    Extremity/Trunk Assessment   Upper Extremity Assessment: RUE deficits/detail;LUE deficits/detail RUE Deficits /  Details: elbow flexion 3+, extension 3+; grip 3+     LUE Deficits / Details: elbow flexion 4/5, extension 4/5, grip 4/5   Lower Extremity Assessment: RLE deficits/detail;LLE deficits/detail RLE Deficits / Details: knee extension 3+, ankle DF 5/5 LLE Deficits / Details: knee extension 3-/5, ankle DF 4/5  Cervical / Trunk Assessment: Kyphotic  Communication   Communication: Expressive difficulties (very hoarse/low volume)  Cognition  Arousal/Alertness: Awake/alert Behavior During Therapy: WFL for tasks assessed/performed Overall Cognitive Status: Within Functional Limits for tasks assessed                      General Comments General comments (skin integrity, edema, etc.): Pt tires easily, especially with talking/interview of prior status    Exercises        Assessment/Plan    PT Assessment Patient needs continued PT services  PT Diagnosis Generalized weakness;Difficulty walking   PT Problem List Decreased strength;Decreased activity tolerance;Decreased balance;Decreased mobility;Decreased knowledge of use of DME;Pain  PT Treatment Interventions DME instruction;Gait training;Stair training;Functional mobility training;Therapeutic activities;Therapeutic exercise;Balance training;Neuromuscular re-education;Patient/family education;Wheelchair mobility training   PT Goals (Current goals can be found in the Care Plan section) Acute Rehab PT Goals Patient Stated Goal: get more strength PT Goal Formulation: With patient Time For Goal Achievement: 05/17/14 Potential to Achieve Goals: Good    Frequency Min 3X/week   Barriers to discharge Decreased caregiver support;Inaccessible home environment 2 interior steps (son carries him up/down);     Co-evaluation               End of Session   Activity Tolerance: Patient limited by fatigue Patient left: in bed;with call bell/phone within reach;with nursing/sitter in room           Time: 1351-1424 PT Time Calculation (min) (ACUTE ONLY): 33 min   Charges:   PT Evaluation $Initial PT Evaluation Tier I: 1 Procedure PT Treatments $Therapeutic Activity: 8-22 mins   PT G Codes:        Lasundra Hascall May 24, 2014, 2:48 PM Pager [phone]

## 2014-05-03 NOTE — Progress Notes (Signed)
*  PRELIMINARY RESULTS* Vascular Ultrasound Carotid Duplex (Doppler) has been completed.  Findings suggest 1-39% internal carotid artery stenosis bilaterally. Vertebral arteries are patent with antegrade flow.  05/03/2014 11:19 AM Maudry Mayhew, RVT, RDCS, RDMS

## 2014-05-03 NOTE — Progress Notes (Signed)
INITIAL NUTRITION ASSESSMENT  DOCUMENTATION CODES Per approved criteria  -Severe malnutrition in the context of chronic illness    Pt meets the criteria for severe MALNUTRITION in the context of chronic illness as evidenced by 20% weight loss is past 6 months, >75% PO intake in the past month and severe depletion of fat and muscle mass.   INTERVENTION: -Ensure Complete BID, each supplement provides 350 kcal and 13 grams of protein -RD to monitor toleration of Dysphagia diet and PO intake  NUTRITION DIAGNOSIS: Inadequate oral intake related to thrush/dysphagia as evidenced by >75% PO intake in the past month.   Goal: Pt to meet >/=90% of needs through meals and supplements  Monitor:  -Pt PO intake, weight, labs  Reason for Assessment: MST = 5  81 y.o. male  Admitting Dx: CVA (cerebral vascular accident)  ASSESSMENT: Pt presents with neutropenic fever and anemia.  Hx of lymphoma, undergoing chemotherapy, DM, HTN, Stage III CKD, and sleep apnea. Chemotherapy is currently paused. Pt has been feeling weak and the past 2-3 days his right leg feels like "lead".  He reports right sided weakness and appears to have a right side facial droop. Pt reports poor appetite and difficulty swallowing due to thrush.  SLP performed Modified Barium Swallow Study today, he is now on a Dysphagia I diet and nectar thick liquids.  Pt reports decreased appetite and PO intake since the start of his chemotherapy treatments due to thrush. Pt consumed most of his lunch consisting of eggs, sausage, rice, thickened orange juice, he did not like the thickened milk however.      Pt reports UBW of 190 lbs.  Says he has lost a lot of weight since he started chemotherapy.  Bed scale reported a weight of 151 lbs, although we do not have knowledge of when bed scale was zeroed so this may not be accurate.   Pt usually drinks 2 protein shakes a day at home during chemo treatments, he reports he really enjoys  them.  Nutrition Focused Physical Exam:  Subcutaneous Fat:  Orbital Region: severe depletion Upper Arm Region: severe depletion Thoracic and Lumbar Region: na  Muscle:  Temple Region: severe depletion Clavicle Bone Region: severe depletion Clavicle and Acromion Bone Region: severe depletion Scapular Bone Region: not assessed, pt very weak Dorsal Hand: severe depletion Patellar Region: severe depletion Anterior Thigh Region: severe depletion Posterior Calf Region: severe depletion  Edema: none present   Height: Ht Readings from Last 1 Encounters:  04/13/14 5\' 7"  (1.702 m)    Weight: Wt Readings from Last 1 Encounters:  04/13/14 159 lb 6.4 oz (72.303 kg)    Ideal Body Weight: 148  % Ideal Body Weight: 107%  Wt Readings from Last 10 Encounters:  04/13/14 159 lb 6.4 oz (72.303 kg)  04/12/14 162 lb 8 oz (73.71 kg)  04/04/14 159 lb 6.4 oz (72.303 kg)  03/14/14 161 lb 1.6 oz (73.074 kg)  03/01/14 165 lb 4.8 oz (74.98 kg)  02/21/14 165 lb 11.2 oz (75.161 kg)  02/08/14 168 lb 12.8 oz (76.567 kg)  01/31/14 173 lb 11.2 oz (78.79 kg)  01/19/14 184 lb 3.2 oz (83.553 kg)  01/14/14 188 lb 12.8 oz (85.639 kg)    Usual Body Weight: 190 lbs  % Usual Body Weight: 79%  BMI:  There is no weight on file to calculate BMI.  26.4  Estimated Nutritional Needs: Kcal: 2100-2300 kcals Protein: 85-105 g protein Fluid: >/= 2100 mL/day  Skin: Dry, intact  Diet Order: DIET -  DYS 1   Intake/Output Summary (Last 24 hours) at 05/03/14 1326 Last data filed at 05/03/14 0942  Gross per 24 hour  Intake    200 ml  Output      0 ml  Net    200 ml    Last BM: 1/19   Labs:   Recent Labs Lab 05/02/14 1316 05/02/14 1329 05/03/14 0559  NA 135 134* 136  K 4.8 4.6 3.8  CL 94* 93* 96  CO2 30  --  31  BUN 24* 29* 18  CREATININE 0.80 0.80 0.76  CALCIUM 9.1  --  9.0  GLUCOSE 188* 188* 143*    CBG (last 3)   Recent Labs  05/02/14 1830 05/02/14 2141 05/03/14 0644  GLUCAP  112* 170* 136*    Scheduled Meds: . allopurinol  300 mg Oral Daily  . antiseptic oral rinse  7 mL Mouth Rinse q12n4p  . aspirin  325 mg Oral Daily  . atorvastatin  10 mg Oral q1800  . chlorhexidine  15 mL Mouth Rinse BID  . diltiazem  120 mg Oral Daily  . docusate sodium  100 mg Oral BID  . enoxaparin (LOVENOX) injection  40 mg Subcutaneous Q24H  . feeding supplement (ENSURE COMPLETE)  237 mL Oral Q24H  . fluconazole  100 mg Oral Daily  . [START ON 05/04/2014] Influenza vac split quadrivalent PF  0.5 mL Intramuscular Tomorrow-1000  . insulin aspart  0-15 Units Subcutaneous TID WC  . insulin aspart  0-5 Units Subcutaneous QHS  . loratadine  10 mg Oral BH-q7a  . metoprolol  25 mg Oral BID  . miconazole  1 application Topical BID  . nystatin  5 mL Oral QID  . ondansetron  8 mg Oral Q12H  . polyethylene glycol  17 g Oral Daily  . predniSONE  10 mg Oral Q breakfast  . sodium chloride  3 mL Intravenous Q12H  . sucralfate  1 g Oral TID WC & HS    Continuous Infusions: . sodium chloride 75 mL/hr at 05/02/14 2034    Past Medical History  Diagnosis Date  . Hypertension   . Hyperlipemia   . Anxiety   . Lymphadenopathy, abdominal   . Urethral stricture   . Anemia   . BPPV (benign paroxysmal positional vertigo)   . Lymphoma   . Diabetes mellitus without complication   . Obstructive sleep apnea     uses cpap at home  . Sleep apnea     Past Surgical History  Procedure Laterality Date  . Back surgery    . Prostate surgery    . Tonsillectomy      Elmer Picker MS Dietetic Intern Pager Number [phone]

## 2014-05-03 NOTE — Progress Notes (Signed)
Rehab Admissions Coordinator Note:  Patient was screened by Retta Diones for appropriateness for an Inpatient Acute Rehab Consult.  At this time, we are recommending Inpatient Rehab consult.  Retta Diones 05/03/2014, 3:16 PM  I can be reached at [phone].

## 2014-05-04 ENCOUNTER — Inpatient Hospital Stay (HOSPITAL_COMMUNITY): Payer: Medicare Other

## 2014-05-04 ENCOUNTER — Other Ambulatory Visit: Payer: Self-pay

## 2014-05-04 DIAGNOSIS — R29898 Other symptoms and signs involving the musculoskeletal system: Secondary | ICD-10-CM

## 2014-05-04 DIAGNOSIS — I517 Cardiomegaly: Secondary | ICD-10-CM

## 2014-05-04 LAB — GLUCOSE, CAPILLARY
GLUCOSE-CAPILLARY: 140 mg/dL — AB (ref 70–99)
GLUCOSE-CAPILLARY: 150 mg/dL — AB (ref 70–99)
Glucose-Capillary: 156 mg/dL — ABNORMAL HIGH (ref 70–99)
Glucose-Capillary: 175 mg/dL — ABNORMAL HIGH (ref 70–99)

## 2014-05-04 LAB — CBC
HCT: 36.4 % — ABNORMAL LOW (ref 39.0–52.0)
Hemoglobin: 12.3 g/dL — ABNORMAL LOW (ref 13.0–17.0)
MCH: 27.8 pg (ref 26.0–34.0)
MCHC: 33.8 g/dL (ref 30.0–36.0)
MCV: 82.2 fL (ref 78.0–100.0)
PLATELETS: 313 10*3/uL (ref 150–400)
RBC: 4.43 MIL/uL (ref 4.22–5.81)
RDW: 16.9 % — ABNORMAL HIGH (ref 11.5–15.5)
WBC: 16.8 10*3/uL — ABNORMAL HIGH (ref 4.0–10.5)

## 2014-05-04 LAB — BASIC METABOLIC PANEL
ANION GAP: 9 (ref 5–15)
BUN: 15 mg/dL (ref 6–23)
CALCIUM: 9 mg/dL (ref 8.4–10.5)
CO2: 31 mmol/L (ref 19–32)
Chloride: 95 mEq/L — ABNORMAL LOW (ref 96–112)
Creatinine, Ser: 0.69 mg/dL (ref 0.50–1.35)
GFR calc non Af Amer: 84 mL/min — ABNORMAL LOW (ref >90)
Glucose, Bld: 152 mg/dL — ABNORMAL HIGH (ref 70–99)
POTASSIUM: 3.9 mmol/L (ref 3.5–5.1)
Sodium: 135 mmol/L (ref 135–145)

## 2014-05-04 MED ORDER — GADOBENATE DIMEGLUMINE 529 MG/ML IV SOLN
15.0000 mL | Freq: Once | INTRAVENOUS | Status: AC | PRN
  Administered 2014-05-04: 15 mL via INTRAVENOUS

## 2014-05-04 NOTE — Progress Notes (Signed)
I will follow up with pt and family as medical workup completes to assist in [place] for rehab. [phone]

## 2014-05-04 NOTE — Progress Notes (Signed)
Patient noted to have increased right sided weakness, sys of 196, slurred speech, notified of a.fib with rvr by tele hr 140's-180's. , patient complaining of pain in right arm. Notified Schorr NP, paged Camilo MD.

## 2014-05-04 NOTE — Evaluation (Signed)
Clinical/Bedside Swallow Evaluation Patient Details  Name: Erik Ramirez MRN: [id number] Date of Birth: May 01, 1925  Today's Date: 05/04/2014 Time: 1035-1106 SLP Time Calculation (min) (ACUTE ONLY): 31 min  Past Medical History:  Past Medical History  Diagnosis Date  . Hypertension   . Hyperlipemia   . Anxiety   . Lymphadenopathy, abdominal   . Urethral stricture   . Anemia   . BPPV (benign paroxysmal positional vertigo)   . Lymphoma   . Diabetes mellitus without complication   . Obstructive sleep apnea     uses cpap at home  . Sleep apnea    Past Surgical History:  Past Surgical History  Procedure Laterality Date  . Back surgery    . Prostate surgery    . Tonsillectomy     HPI:  81 year old male with h/o HTN, anxiety, lymphoma, sleep apnea, DM, anemia seen for OP MBS due to c/o dysphagia x 1 week. Patient and son providing history which includes also within the week, new onset right arm weakness and numbness, right leg numbness, significant generalized weakness, severe vocal quality changes, falls. SLP confirms severe dysphonia (almost aphonic) as well as right sided facial droop.    Assessment / Plan / Recommendation Clinical Impression  Pt presents with clinical indications of severe pharyngeal dysphagia and + aspiration across all consistencies tested (ice, thin).  Multiple swallows with immediate and delayed cough with expectoration observed.  Pt reports dysphagia to be worse now than during MBS two days previously.  He states his voice has been dysphonic since prior to cancer dx Sept 2015 and his dysphagia present x 1 week with rapid onset.   Today pt unable to lift right leg and has weakness with right arm - note MRI negative but reports he is for MRI of cervical spine.  Pt is high aspiration/malnutrition risk with any po intake - due to level of dysphagia.  He reports swallowing to be worse with thicker consistencies stating all lodges in his throat and he has to  expectorate it.  Po intake is not pleasurable for this pt per his statement.    Reviewed with MD, pt and RN findings of evaluation and concern for overt aspiration.  Using teach back with pt, reviewed previous MBS and aspiration precautions (with npo status except ice).    Will follow up to aid in pt care plan, thanks for this order.  Do not recommend MBS at this time due to overt deficits and MBS completed two days ago.      Aspiration Risk  Severe    Diet Recommendation NPO;Ice chips PRN after oral care        Other  Recommendations   tbd  Follow Up Recommendations    tbd   Frequency and Duration min 2x/week  2 weeks   Pertinent Vitals/Pain Afebrile decreased WBC increasing     Swallow Study Prior Functional Status   see [place] Date of Onset: 05/04/14 HPI: 81 year old male with h/o HTN, anxiety, lymphoma, sleep apnea, DM, anemia seen for OP MBS due to c/o dysphagia x 1 week. Patient and son providing history which includes also within the week, new onset right arm weakness and numbness, right leg numbness, significant generalized weakness, severe vocal quality changes, falls. SLP confirms severe dysphonia (almost aphonic) as well as right sided facial droop.  Type of Study: Bedside swallow evaluation Previous Swallow Assessment: MBS two days ago, found to be high aspiration risk Diet Prior to this  Study: Dysphagia 1 (puree);Nectar-thick liquids Temperature Spikes Noted: No Respiratory Status: Room air History of Recent Intubation: No Behavior/Cognition: Alert;Cooperative;Pleasant mood Oral Cavity - Dentition: Adequate natural dentition Self-Feeding Abilities: Able to feed self Patient Positioning: Upright in bed Baseline Vocal Quality: Aphonic;Hoarse Volitional Cough: Weak Volitional Swallow: Able to elicit    Oral/Motor/Sensory Function Overall Oral Motor/Sensory Function:  (generalized weakness and appearance of facial CN deficit) Labial ROM: Reduced  right Labial Symmetry: Abnormal symmetry right Labial Strength: Reduced Facial Symmetry: Right droop Facial Strength: Reduced Velum:  (sluggish)   Ice Chips Ice chips: Impaired Presentation: Spoon Oral Phase Functional Implications: Prolonged oral transit Pharyngeal Phase Impairments: Cough - Immediate;Cough - Delayed (multiple swallows) Other Comments: cough and expectoration   Thin Liquid Thin Liquid: Impaired Presentation: Spoon Pharyngeal  Phase Impairments: Cough - Immediate;Cough - Delayed;Multiple swallows Other Comments: cough and expectoration    Nectar Thick Nectar Thick Liquid: Not tested   Honey Thick Honey Thick Liquid: Not tested   Puree Puree: Not tested   Solid   GO    Solid: Not tested       Luanna Salk, [facility] SLP [phone]

## 2014-05-04 NOTE — Consult Note (Signed)
Physical Medicine and Rehabilitation Consult  Reason for Consult: Gait disorder in patient with B cell lymphoma and ongoing chemotherapy Referring Physician: Dr. Waldron Labs   HPI: Erik Ramirez is a 81 y.o. male with a past medical history significant for HTN, BPPV, DM, PAF, diffuse large B-cell lymphoma started on chemotherapy 12/2013 and recently hospitalized for neutropenic fever with anemia and elevated troponin due to demand ischemia. He wasdischarged to home on 04/17/14 but has had difficulty walking with multiple falls, difficulty swallowing as well as progressive weakness. He was readmitted to [facility] on 05/02/14 with right shoulder and RLE pain with weakness and FTT. MRI/MRA brain with atrophy and mild chronic changes.MBS done showing moderate to severe pharyngeal dysphagia with residues and penetration. Patient was placed on dysphagia 2 diet with nectar liquids.  Neurology consulted for input and patient with RUE drift and positive Hoovers on LE testing.  MRI cervical spine ordered for work up. PT evaluation done yesterday and CIR recommended by MD and Rehab team.   Pt with hoarse voice, c/o weakness on Right side Review of Systems  Constitutional: Positive for malaise/fatigue.  HENT: Negative for hearing loss.   Eyes: Negative for blurred vision.  Respiratory: Negative for shortness of breath.   Cardiovascular: Negative for chest pain.  Gastrointestinal:       Sore throat with inability to swallow  Musculoskeletal: Positive for falls.  Neurological: Positive for sensory change (Right > left hands with tingling and RLE with "heaviness") and weakness. Negative for headaches.      Past Medical History  Diagnosis Date  . Hypertension   . Hyperlipemia   . Anxiety   . Lymphadenopathy, abdominal   . Urethral stricture   . Anemia   . BPPV (benign paroxysmal positional vertigo)   . Lymphoma   . Diabetes mellitus without complication   . Obstructive sleep apnea     uses cpap  at home  . Sleep apnea     Past Surgical History  Procedure Laterality Date  . Back surgery    . Prostate surgery    . Tonsillectomy      No family history on file.    Social History:  Married. 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  Was active prior to this summer. Has required a walker past month. He reports that he quit smoking about 46 years ago. He has never used smokeless tobacco. He reports that he does not drink alcohol or use illicit drugs.    Allergies  Allergen Reactions  . Rituximab Other (See Comments)    Developed wheezing & rigors when infusion rate increased to 100 mg/hr.  Tolerates rate of 50 mg/hr.  . Caffeine Other (See Comments)    Makes patient feel like he's going to die  . Morphine And Related Other (See Comments)    excitability  . Penicillins Rash    Medications Prior to Admission  Medication Sig Dispense Refill  . allopurinol (ZYLOPRIM) 300 MG tablet Take 1 tablet (300 mg total) by mouth daily. 90 tablet 1  . aspirin 325 MG tablet Take 1 tablet (325 mg total) by mouth daily. 30 tablet 0  . clorazepate (TRANXENE) 7.5 MG tablet Take 1 tablet (7.5 mg total) by mouth 2 (two) times daily as needed for anxiety or sleep. (Patient taking differently: Take 7.5 mg by mouth every morning. ) 180 tablet 0  . clotrimazole-betamethasone (LOTRISONE) cream Apply 1 application topically 2 (two)  times daily as needed (for itching).     Marland Kitchen diltiazem (CARDIZEM CD) 120 MG 24 hr capsule Take 1 capsule (120 mg total) by mouth daily. 90 capsule 1  . docusate sodium 100 MG CAPS Take 100 mg by mouth 2 (two) times daily. 10 capsule 0  . feeding supplement, ENSURE COMPLETE, (ENSURE COMPLETE) LIQD Take 237 mLs by mouth daily. 20 Bottle 1  . fluconazole (DIFLUCAN) 100 MG tablet Take 1 tablet (100 mg total) by mouth daily. 30 tablet 0  . insulin aspart (NOVOLOG) 100 UNIT/ML injection Inject 0-20 Units into the skin 3 (three) times daily  with meals. 60 mL 3  . lidocaine-prilocaine (EMLA) cream Apply 1 application topically as needed. APPLY TO PORT SITE 1 HOUR PRIOR TO THERAPY. DO NOT RUB IN. COVER WITH SARAN WRAP. 30 g 2  . loratadine (CLARITIN) 10 MG tablet Take 10 mg by mouth every morning.    . metoprolol (LOPRESSOR) 50 MG tablet Take 25 mg by mouth 2 (two) times daily.      . naproxen (NAPROSYN) 375 MG tablet Take 1 tablet (375 mg total) by mouth 3 (three) times daily with meals. (Patient taking differently: Take 375 mg by mouth 3 (three) times daily as needed for moderate pain. ) 20 tablet 0  . nystatin (MYCOSTATIN) 100000 UNIT/ML suspension SWISH AND SWALLOW 5 MLS BY MOUTH FOUR TIMES DAILY (Patient taking differently: Take 5 mLs by mouth 4 (four) times daily. SWISH AND SWALLOW) 240 mL 3  . ondansetron (ZOFRAN) 8 MG tablet Take 1 tablet (8 mg total) by mouth every 12 (twelve) hours. (Patient taking differently: Take 8 mg by mouth 2 (two) times daily as needed for nausea. ) 20 tablet 0  . polyethylene glycol (MIRALAX / GLYCOLAX) packet Take 17 g by mouth daily.    . predniSONE (DELTASONE) 10 MG tablet Take 1 tablet (10 mg total) by mouth daily with breakfast. 30 tablet 0  . simvastatin (ZOCOR) 20 MG tablet Take 1 tablet (20 mg total) by mouth daily. 90 tablet 3  . sucralfate (CARAFATE) 1 GM/10ML suspension Take 10 mLs (1 g total) by mouth 4 (four) times daily -  with meals and at bedtime. 420 mL 1  . traMADol (ULTRAM) 50 MG tablet Take 50 mg by mouth every 6 (six) hours as needed for moderate pain.    Marland Kitchen glucose blood test strip ONE TOUCH ULTRA .CHECK BLOOD SUGAR AC AND HS DAILY 480 each 3  . miconazole (MICATIN) 2 % cream Apply 1 application topically 2 (two) times daily. Apply to groin area twice daily until clear (Patient not taking: Reported on 04/25/2014) 45 g 0  . predniSONE (DELTASONE) 20 MG tablet Take 3 tablets (60 mg total) by mouth daily with breakfast. (Patient taking differently: Take 60 mg by mouth daily with breakfast.  For 5 days after treatment) 15 tablet 5    Home: [place] expects to be discharged to:: Private residence Living Arrangements: Spouse/significant other Available Help at Discharge: Family, Available 24 hours/day (sons live nearby; one retired) Type of Home: House Home Access: Level entry Villanueva: Two level Alternate Level Stairs-Number of Steps: 2 Alternate Level Stairs-Rails: None Home Equipment: Environmental consultant - 2 wheels, Cane - single point, Grab bars - tub/shower, Wheelchair - manual  Functional History: Prior Function Level of Independence: Needs assistance Gait / Transfers Assistance Needed: only walking with HHPT or his sons due to frequent Lt knee buckling and near falls (62 yo wife cannot safely assist him--she calls  her sons to help pt to transfer each time); 1 son carries pt up/down 2 interior steps Functional Status:  Mobility: Bed Mobility Overal bed mobility: Needs Assistance Bed Mobility: Rolling, Sidelying to Sit, Sit to Sidelying Rolling: Supervision Sidelying to sit: Min assist Sit to sidelying: Min guard General bed mobility comments: with rail; vc for technique; assist due to weakness Transfers General transfer comment: deferred due to LE weakness and recent falls (will need +2 for safety)      ADL:    Cognition: Cognition Overall Cognitive Status: Within Functional Limits for tasks assessed Orientation Level: Oriented to person, Oriented to place, Oriented to situation, Disoriented to time Cognition Arousal/Alertness: Awake/alert Behavior During Therapy: WFL for tasks assessed/performed Overall Cognitive Status: Within Functional Limits for tasks assessed  Blood pressure 166/74, pulse 91, temperature 98.2 F (36.8 C), temperature source Oral, resp. rate 16, SpO2 96 %. Physical Exam  Nursing note and vitals reviewed. Constitutional: He is oriented to person, place, and time. He appears well-developed. He appears cachectic.  HENT:    Head: Normocephalic and atraumatic.  Tongue dry with thrush.   Eyes: Conjunctivae are normal. Pupils are equal, round, and reactive to light.  Neck: Normal range of motion. Neck supple.  Cardiovascular: Normal rate and regular rhythm.   Respiratory: Effort normal and breath sounds normal. No respiratory distress. He has no wheezes.  GI: Soft. Bowel sounds are normal.  Musculoskeletal:  BLE with muscle wasting. Bilateral knees with healing abrasions.   Neurological: He is alert and oriented to person, place, and time.  Dysphonic speech. Follows basic commands without difficulty. Diffuse weakness greater proximal LLE and RUE distally.   Skin: Skin is warm and dry.  RUE 3/5  RLE 2-/5 LLE3+/5 LUE 5/5  Results for orders placed or performed during the hospital encounter of 05/02/14 (from the past 24 hour(s))  Glucose, capillary     Status: Abnormal   Collection Time: 05/03/14  1:32 PM  Result Value Ref Range   Glucose-Capillary 267 (H) 70 - 99 mg/dL  Glucose, capillary     Status: Abnormal   Collection Time: 05/03/14  4:08 PM  Result Value Ref Range   Glucose-Capillary 141 (H) 70 - 99 mg/dL  Glucose, capillary     Status: Abnormal   Collection Time: 05/03/14  9:16 PM  Result Value Ref Range   Glucose-Capillary 140 (H) 70 - 99 mg/dL   Comment 1 Documented in Chart    Comment 2 Notify RN   CBC     Status: Abnormal   Collection Time: 05/04/14  6:15 AM  Result Value Ref Range   WBC 16.8 (H) 4.0 - 10.5 K/uL   RBC 4.43 4.22 - 5.81 MIL/uL   Hemoglobin 12.3 (L) 13.0 - 17.0 g/dL   HCT 36.4 (L) 39.0 - 52.0 %   MCV 82.2 78.0 - 100.0 fL   MCH 27.8 26.0 - 34.0 pg   MCHC 33.8 30.0 - 36.0 g/dL   RDW 16.9 (H) 11.5 - 15.5 %   Platelets 313 150 - 400 K/uL  Basic metabolic panel     Status: Abnormal   Collection Time: 05/04/14  6:15 AM  Result Value Ref Range   Sodium 135 135 - 145 mmol/L   Potassium 3.9 3.5 - 5.1 mmol/L   Chloride 95 (L) 96 - 112 mEq/L   CO2 31 19 - 32 mmol/L   Glucose,  Bld 152 (H) 70 - 99 mg/dL   BUN 15 6 - 23 mg/dL   Creatinine,  Ser 0.69 0.50 - 1.35 mg/dL   Calcium 9.0 8.4 - 10.5 mg/dL   GFR calc non Af Amer 84 (L) >90 mL/min   GFR calc Af Amer >90 >90 mL/min   Anion gap 9 5 - 15  Glucose, capillary     Status: Abnormal   Collection Time: 05/04/14  7:11 AM  Result Value Ref Range   Glucose-Capillary 156 (H) 70 - 99 mg/dL   Comment 1 Documented in Chart    Comment 2 Notify RN    Dg Shoulder Right  05/02/2014   CLINICAL DATA:  Pain all over  EXAM: RIGHT SHOULDER - 2+ VIEW  COMPARISON:  None.  FINDINGS: There is no fracture or dislocation. There are mild degenerative changes of the acromioclavicular joint.  IMPRESSION: No acute osseous injury of the right shoulder.   Electronically Signed   By: Kathreen Devoid   On: 05/02/2014 20:18   Ct Head Wo Contrast  05/02/2014   CLINICAL DATA:  Right upper extremity weakness. Recent diagnosis of lymphoma.  EXAM: CT HEAD WITHOUT CONTRAST  TECHNIQUE: Contiguous axial images were obtained from the base of the skull through the vertex without intravenous contrast.  COMPARISON:  CT scan dated 06/29/2007  FINDINGS: No mass lesion. No midline shift. No acute hemorrhage or hematoma. No extra-axial fluid collections. No evidence of acute infarction. There is a small old white matter infarct high in the right frontal lobe. There is diffuse slight cerebral cortical atrophy, stable. No ventricular dilatation.  Osseous structures are normal.  IMPRESSION: No acute abnormality. 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   Electronically Signed   By: Rozetta Nunnery M.D.   On: 05/02/2014 14:08   Mr Jodene Nam Head Wo Contrast  05/02/2014   CLINICAL DATA:  Leg weakness. Difficulty walking. History of lymphoma and diabetes and atrial fibrillation.  EXAM: MRI HEAD WITHOUT AND WITH CONTRAST  MRA HEAD WITHOUT CONTRAST  TECHNIQUE: Multiplanar, multiecho pulse sequences of the brain and surrounding structures were obtained without and with  intravenous contrast. Angiographic images of the head were obtained using MRA technique without contrast.  CONTRAST:  15mL MULTIHANCE GADOBENATE DIMEGLUMINE 529 MG/ML IV SOLN  COMPARISON:  CT head 05/02/2014  FINDINGS: MRI HEAD FINDINGS  Negative for acute infarct.  Moderate atrophy. Mild chronic microvascular ischemic changes in the cerebral white matter. Basal ganglia and brainstem and cerebellum normal.  Negative for mass or edema.  Normal enhancement following contrast administration. No enhancing mass lesion identified.  MRA HEAD FINDINGS  Both vertebral arteries are patent to the basilar without significant stenosis. Left vertebral artery dominant. PICA, superior cerebellar, posterior cerebral artery is patent bilaterally without stenosis. Basilar widely patent.  Internal carotid artery widely patent bilaterally. Anterior and middle cerebral arteries widely patent without stenosis  Negative for cerebral aneurysm.  IMPRESSION: Atrophy and mild chronic microvascular ischemia.  No acute infarct  Negative MRA head.   Electronically Signed   By: Franchot Gallo M.D.   On: 05/02/2014 20:14   Mr Jeri Cos XB Contrast  05/02/2014   CLINICAL DATA:  Leg weakness. Difficulty walking. History of lymphoma and diabetes and atrial fibrillation.  EXAM: MRI HEAD WITHOUT AND WITH CONTRAST  MRA HEAD WITHOUT CONTRAST  TECHNIQUE: Multiplanar, multiecho pulse sequences of the brain and surrounding structures were obtained without and with intravenous contrast. Angiographic images of the head were obtained using MRA technique without contrast.  CONTRAST:  65mL MULTIHANCE GADOBENATE DIMEGLUMINE 529 MG/ML IV SOLN  COMPARISON:  CT head  05/02/2014  FINDINGS: MRI HEAD FINDINGS  Negative for acute infarct.  Moderate atrophy. Mild chronic microvascular ischemic changes in the cerebral white matter. Basal ganglia and brainstem and cerebellum normal.  Negative for mass or edema.  Normal enhancement following contrast administration. No  enhancing mass lesion identified.  MRA HEAD FINDINGS  Both vertebral arteries are patent to the basilar without significant stenosis. Left vertebral artery dominant. PICA, superior cerebellar, posterior cerebral artery is patent bilaterally without stenosis. Basilar widely patent.  Internal carotid artery widely patent bilaterally. Anterior and middle cerebral arteries widely patent without stenosis  Negative for cerebral aneurysm.  IMPRESSION: Atrophy and mild chronic microvascular ischemia.  No acute infarct  Negative MRA head.   Electronically Signed   By: Franchot Gallo M.D.   On: 05/02/2014 20:14   Dg Op Swallowing Func-medicare/speech Path  05/02/2014   Procedures by Earley Brooke, CCC-SLP at 05/02/2014 1:24 PM    Author: Earley Brooke, CCC-SLP Service: (none) Author Type: Speech  and Language Pathologist   Filed: 05/02/2014 1:25 PM Note Time: 05/02/2014 1:24 PM Status: Signed   Editor: Earle Gell McCoy, CCC-SLP (Speech and Language Pathologist)      Procedures   1. SLP MODIFIED BARIUM SWALLOW [[id number] (Custom)]       Expand All Collapse All    Objective Swallowing Evaluation: Modified Barium Swallowing Study  Patient Details  Name: Tiago Humphrey MRN: [id number] Date of Birth: [birth year]  Today's Date: 05/02/2014 Time: SLP Start Time (ACUTE ONLY): 1135-SLP Stop Time (ACUTE ONLY): 1240 SLP Time Calculation (min) (ACUTE ONLY): 65 min  Past Medical History:  Past Medical History  Diagnosis Date  . Hypertension   . Hyperlipemia   . Anxiety   . Lymphadenopathy, abdominal   . Urethral stricture   . Anemia   . BPPV (benign paroxysmal positional vertigo)   . Lymphoma   . Diabetes mellitus without complication   . Obstructive sleep apnea     uses cpap at home  . Sleep apnea    Past Surgical History:  Past Surgical History  Procedure Laterality Date  . Back surgery    . Prostate surgery    . Tonsillectomy     HPI:  HPI: 81 year old male with h/o  HTN, anxiety, lymphoma, sleep apnea, DM,  anemia seen for OP MBS due to c/o dysphagia x 1 week. Patient and son  providing history which includes also within the week, new onset right arm  weakness and numbness, right leg numbness, significant generalized  weakness, severe vocal quality changes, falls. SLP confirms severe  dysphonia (almost aphonic) as well as right sided facial droop.   No Data Recorded  Assessment / Plan / Recommendation CHL IP CLINICAL IMPRESSIONS 05/02/2014  Dysphagia Diagnosis Moderate pharyngeal phase dysphagia;Severe pharyngeal  phase dysphagia  Clinical impression Patient presents with a moderate-severe primarily  pharyngeal dysphagia characterized by base of tongue, laryngeal, and  pharyngeal weakness resulting in decreased pharyngeal clearance of bolus  and decreased airway protection. SEvere pharyngeal residuals remained post  swallow which patient senses, responding with multiple swallows which are  moderately effective to clear. Penetrates noted with nectar thick liquids  clear with either multiple swallows or clinician cueing for throat clear.  Various head postures assessed (chin tuck, right head turn) and  unsuccessful to reduce pharyngeal residuals or facilitate improved airway  protection. Overall, risk of aspiration high at this time given severity  of deficits, reducing to moderate with strict use of aspiration  precautions which were reviewed  in full with patient and son. Given  severity of neuro symptoms suggestive of CVA, recommended rapid MD f/u.  Attempted to contact referring physician, Dr. Jana Hakim, via phone without  response. Recommended patient go to ED given stroke symptoms. Patient and  family in agreement.       CHL IP TREATMENT RECOMMENDATION 05/02/2014  Treatment Plan Recommendations Defer treatment plan to SLP at (Comment)     CHL IP DIET RECOMMENDATION 05/02/2014  Diet Recommendations Dysphagia 2 (Fine chop);Nectar-thick liquid  Liquid  Administration via Cup;No straw  Medication Administration Crushed with puree  Compensations Slow rate;Small sips/bites;Multiple dry swallows after each  bite/sip;Clear throat after each swallow  Postural Changes and/or Swallow Maneuvers Seated upright 90 degrees     CHL IP OTHER RECOMMENDATIONS 05/02/2014  Recommended Consults (None)  Oral Care Recommendations Oral care BID  Other Recommendations Prohibited food (jello, ice cream, thin soups)     CHL IP FOLLOW UP RECOMMENDATIONS 05/02/2014  Follow up Recommendations Home health SLP     No flowsheet data found.       SLP Swallow Goals No flowsheet data found.  No flowsheet data found.    CHL IP REASON FOR REFERRAL 05/02/2014  Reason for Referral Objectively evaluate swallowing function     CHL IP ORAL PHASE 05/02/2014  Lips (None)  Tongue (None)  Mucous membranes (None)  Nutritional status (None)  Other (None)  Oxygen therapy (None)  Oral Phase WFL  Oral - Pudding Teaspoon (None)  Oral - Pudding Cup (None)  Oral - Honey Teaspoon (None)  Oral - Honey Cup (None)  Oral - Honey Syringe (None)  Oral - Nectar Teaspoon (None)  Oral - Nectar Cup (None)  Oral - Nectar Straw (None)  Oral - Nectar Syringe (None)  Oral - Ice Chips (None)  Oral - Thin Teaspoon (None)  Oral - Thin Cup (None)  Oral - Thin Straw (None)  Oral - Thin Syringe (None)  Oral - Puree (None)  Oral - Mechanical Soft (None)  Oral - Regular (None)  Oral - Multi-consistency (None)  Oral - Pill (None)  Oral Phase - Comment (None)      CHL IP PHARYNGEAL PHASE 05/02/2014  Pharyngeal Phase Impaired  Pharyngeal - Pudding Teaspoon (None)  Penetration/Aspiration details (pudding teaspoon) (None)  Pharyngeal - Pudding Cup (None)  Penetration/Aspiration details (pudding cup) (None)  Pharyngeal - Honey Teaspoon (None)  Penetration/Aspiration details (honey teaspoon) (None)  Pharyngeal - Honey Cup (None)   Penetration/Aspiration details (honey cup) (None)  Pharyngeal - Honey Syringe (None)  Penetration/Aspiration details (honey syringe) (None)  Pharyngeal - Nectar Teaspoon Delayed swallow initiation;Premature  spillage to pyriform sinuses;Reduced pharyngeal peristalsis;Reduced  epiglottic inversion;Reduced anterior laryngeal mobility;Reduced laryngeal  elevation;Reduced airway/laryngeal closure;Reduced tongue base  retraction;Penetration/Aspiration before swallow;Penetration/Aspiration  during swallow;Pharyngeal residue - valleculae;Pharyngeal residue -  pyriform sinuses;Lateral channel residue;Compensatory strategies attempted  (Comment)  Penetration/Aspiration details (nectar teaspoon) Material enters airway,  CONTACTS cords and not ejected out  Pharyngeal - Nectar Cup Delayed swallow initiation;Premature spillage to  pyriform sinuses;Reduced pharyngeal peristalsis;Reduced epiglottic  inversion;Reduced anterior laryngeal mobility;Reduced laryngeal  elevation;Reduced airway/laryngeal closure;Reduced tongue base  retraction;Penetration/Aspiration before swallow;Penetration/Aspiration  during swallow;Pharyngeal residue - valleculae;Pharyngeal residue -  pyriform sinuses;Lateral channel residue;Compensatory strategies attempted  (Comment)  Penetration/Aspiration details (nectar cup) Material enters airway,  CONTACTS cords and not ejected out  Pharyngeal - Nectar Straw (None)  Penetration/Aspiration details (nectar straw) (None)  Pharyngeal - Nectar Syringe (None)  Penetration/Aspiration details (nectar syringe) (None)  Pharyngeal - Ice Chips (None)  Penetration/Aspiration details (ice  chips) (None)  Pharyngeal - Thin Teaspoon (None)  Penetration/Aspiration details (thin teaspoon) (None)  Pharyngeal - Thin Cup Delayed swallow initiation;Premature spillage to  pyriform sinuses;Reduced pharyngeal peristalsis;Reduced epiglottic  inversion;Reduced anterior laryngeal mobility;Reduced laryngeal   elevation;Reduced airway/laryngeal closure;Reduced tongue base  retraction;Penetration/Aspiration before swallow;Penetration/Aspiration  during swallow;Pharyngeal residue - valleculae;Pharyngeal residue -  pyriform sinuses;Lateral channel residue;Compensatory strategies attempted  (Comment);Moderate aspiration  Penetration/Aspiration details (thin cup) Material enters airway, passes  BELOW cords and not ejected out despite cough attempt by patient  Pharyngeal - Thin Straw (None)  Penetration/Aspiration details (thin straw) (None)  Pharyngeal - Thin Syringe (None)  Penetration/Aspiration details (thin syringe') (None)  Pharyngeal - Puree Delayed swallow initiation;Premature spillage to  pyriform sinuses;Reduced pharyngeal peristalsis;Reduced epiglottic  inversion;Reduced anterior laryngeal mobility;Reduced laryngeal  elevation;Reduced airway/laryngeal closure;Reduced tongue base  retraction;Pharyngeal residue - valleculae;Pharyngeal residue - pyriform  sinuses;Lateral channel residue;Compensatory strategies attempted  (Comment)  Penetration/Aspiration details (puree) Material does not enter airway  Pharyngeal - Mechanical Soft Delayed swallow initiation;Premature  spillage to pyriform sinuses;Reduced pharyngeal peristalsis;Reduced  epiglottic inversion;Reduced anterior laryngeal mobility;Reduced laryngeal  elevation;Reduced airway/laryngeal closure;Reduced tongue base  retraction;Pharyngeal residue - valleculae;Pharyngeal residue - pyriform  sinuses;Lateral channel residue;Compensatory strategies attempted  (Comment)  Penetration/Aspiration details (mechanical soft) Material does not enter  airway  Pharyngeal - Regular (None)  Penetration/Aspiration details (regular) (None)  Pharyngeal - Multi-consistency (None)  Penetration/Aspiration details (multi-consistency) (None)  Pharyngeal - Pill (None)  Penetration/Aspiration details (pill) (None)  Pharyngeal Comment (None)     CHL IP  CERVICAL ESOPHAGEAL PHASE 05/02/2014  Cervical Esophageal Phase Impaired  Pudding Teaspoon (None)  Pudding Cup (None)  Honey Teaspoon (None)  Honey Cup (None)  Honey Syringe (None)  Nectar Teaspoon (None)  Nectar Cup (None)  Nectar Straw (None)  Nectar Syringe (None)  Thin Teaspoon (None)  Thin Cup (None)  Thin Straw (None)  Thin Syringe (None)  Cervical Esophageal Comment decreased UES relaxation    CHL IP GO 05/02/2014  Functional Assessment Tool Used skilled clinical judgement  Functional Limitations Swallowing  Swallow Current Status (F7902) CL  Swallow Goal Status (I0973) CL  Swallow Discharge Status (Z3299) CL  Motor Speech Current Status (M4268) (None)  Motor Speech Goal Status (T4196) (None)  Motor Speech Goal Status (Q2297) (None)  Spoken Language Comprehension Current Status (L8921) (None)  Spoken Language Comprehension Goal Status (J9417) (None)  Spoken Language Comprehension Discharge Status (E0814) (None)  Spoken Language Expression Current Status (G8185) (None)  Spoken Language Expression Goal Status (U3149) (None)  Spoken Language Expression Discharge Status (F0263) (None)  Attention Current Status (Z8588) (None)  Attention Goal Status (F0277) (None)  Attention Discharge Status (A1287) (None)  Memory Current Status (O6767) (None)  Memory Goal Status (M0947) (None)  Memory Discharge Status (S9628) (None)  Voice Current Status (Z6629) (None)  Voice Goal Status (U7654) (None)  Voice Discharge Status (Y5035) (None)  Other Speech-Language Pathology Functional Limitation (W6568) (None)  Other Speech-Language Pathology Functional Limitation Goal Status (L2751)  (None)  Other Speech-Language Pathology Functional Limitation Discharge Status  [phone]) (None)   Gabriel Rainwater MA, CCC-SLP [phone]         McCoy Leah Meryl 05/02/2014, 1:24 PM            05/02/2014   CLINICAL DATA: swallowing difficulties   FLUOROSCOPY FOR  SWALLOWING FUNCTION STUDY:  Fluoroscopy was provided for swallowing function study, which was  administered by a speech pathologist.  Final results and recommendations  from this study are contained within the speech pathology report.    Dg Hips  Bilat With Pelvis 2v  05/02/2014   CLINICAL DATA:  Fall.  Pain.  EXAM: DG HIP W/ PELVIS 2V BILAT  COMPARISON:  None.  FINDINGS: No acute fracture. No dislocation. Mild osteopenia. Minimal degenerative change of the right hip joint.  IMPRESSION: No acute bony pathology.   Electronically Signed   By: Maryclare Bean M.D.   On: 05/02/2014 20:14    Assessment/Plan: Diagnosis:  Triplegia of undetrmined etiology, suspect cervical myelopathy 1. Does the need for close, 24 hr/day medical supervision in concert with the patient's rehab needs make it unreasonable for this patient to be served in a less intensive setting? Yes 2. Co-Morbidities requiring supervision/potential complications: diabetes, lymphoma 3. Due to bladder management, bowel management, safety, skin/wound care, disease management, medication administration, pain management and patient education, does the patient require 24 hr/day rehab nursing? Yes 4. Does the patient require coordinated care of a physician, rehab nurse, PT (1-2 hrs/day, 5 days/week), OT (1-2 hrs/day, 5 days/week) and SLP (.5-1 hrs/day, 5 days/week) to address physical and functional deficits in the context of the above medical diagnosis(es)? Yes Addressing deficits in the following areas: balance, endurance, locomotion, strength, transferring, bowel/bladder control, bathing, dressing, feeding, grooming, toileting, cognition, speech, language, swallowing and psychosocial support 5. Can the patient actively participate in an intensive therapy program of at least 3 hrs of therapy per day at least 5 days per week? No 6. The potential for patient to make measurable gains while on inpatient rehab is NA 7. Anticipated functional outcomes upon  discharge from inpatient rehab are min assist  with PT, min assist with OT, min assist with SLP. 8. Estimated rehab length of stay to reach the above functional goals is: 12-14d 9. Does the patient have adequate social supports and living environment to accommodate these discharge functional goals? Potentially 10. Anticipated D/C setting: Home 11. Anticipated post D/C treatments: [place] therapy 12. Overall Rehab/Functional Prognosis: fair  RECOMMENDATIONS: This patient's condition is appropriate for continued rehabilitative care in the following setting: CIR once tolerating therapy and work up and treatment of triplegia is completed Patient has agreed to participate in recommended program. Potentially Note that insurance prior authorization may be required for reimbursement for recommended care.  Comment: NS consult pending will need to review treatment rec prior to further rehab decisions    05/04/2014

## 2014-05-04 NOTE — Progress Notes (Signed)
  Echocardiogram 2D Echocardiogram has been performed.  Erik Ramirez 05/04/2014, 4:51 PM

## 2014-05-04 NOTE — Progress Notes (Signed)
PROGRESS NOTE  Erik Ramirez QBH:[id number] DOB: 13-Jul-1925 DOA: 05/02/2014 PCP: Pcp Not In System  HPI: 81 year old male with history of diffuse large B-cell lymphoma on chemotherapy, diabetes mellitus, paroxysmal A. fib, hypertension, obstructive sleep apnea was recently hospitalized for neutropenic fever and anemia, elevated troponin due to demand ischemia, and was discharged home with home health. Patient reports that he has been feeling weak since his discharge and not having enough strength in his legs. He fell in his bathroom about 10 days back and says that his legs gave away. He thinks he may have hit his right scapula, hip and right knee and leg. Since then he has been progressively feeling weak and having pain in his right shoulder, hips and the right leg. For past 2-3 days he reports that his right leg feels like "lead ". He denies any syncope, headache or dizziness. He denies any tingling or numbness of his extremities. Denies any bowel or urinary symptoms. Denies any blurred vision. He reports poor appetite and difficulty swallowing due to oral thrush. Patient denies fever, chills, nausea , vomiting, chest pain, palpitations, SOB, abdominal pain, bowel or urinary symptoms. As reported that his right hand has been week for past 1 week. Pt had presented for outpatient swallow eval today and was referred to ED given complains of right-sided weakness.  Subjective / 24 H Interval events Pain better this morning  Assessment/Plan: Principal Problem:   CVA (cerebral vascular accident) Active Problems:   Diabetes mellitus without complication   DLBCL (diffuse large B cell lymphoma)   CKD (chronic kidney disease), stage III   Mucositis due to antineoplastic therapy   Anemia in neoplastic disease   Protein-calorie malnutrition, severe   Fall at home   Weakness generalized   Right hemiparesis  Rt sided weakness - Neurology consulted, appreciate input. MRI negative, pending  - MRI C  spine showing Multilevel cervical spondylosis, worst at C5-6 where there is mild spinal stenosis. - subjectively better - We'll check carotid Dopplers. - PT/OT eval.  Dysphagia - Keep nothing by mouth, continue with fluids.   Right shoulder and hip pain - Secondary to fall. No fractures on XR  Leukocytosis - Most likely related to steroids, around baseline, x-ray showing no opacity or infiltrate, urinalysis is -1/18  DLBCL (diffuse large B cell lymphoma) - Following with Dr. Jana Hakim as outpatient and on chemotherapy.  Diabetes mellitus type 2 continue SSI  CKD (chronic kidney disease), stage III Renal function stable.  Paroxysmal A. Fib Currently in sinus. Continue aspirin and beta blocker  Mucositis due to antineoplastic therapy As diffuse oral thrush. Continue fluconazole and nystatin.  Anemia in neoplastic disease H&H stable  Protein-calorie malnutrition, severe conitnue supplements  HTN - continue home medications, somewhat hypertensive this morning, add hydralazine PRN  Diet:   Fluids: none DVT Prophylaxis: Lovenox  Code Status: Full Code Family Communication: nonat bedside disposition plan: Remains inpatient     Consults  Neurology   Procedures:  None    Antibiotics  Anti-infectives    Start     Dose/Rate Route Frequency Ordered Stop   05/02/14 1830  fluconazole (DIFLUCAN) tablet 100 mg     100 mg Oral Daily 05/02/14 1821         Studies  Dg Shoulder Right  05/02/2014   CLINICAL DATA:  Pain all over  EXAM: RIGHT SHOULDER - 2+ VIEW  COMPARISON:  None.  FINDINGS: There is no fracture or dislocation. There are mild degenerative changes of the acromioclavicular joint.  IMPRESSION: No acute osseous injury of the right shoulder.   Electronically Signed   By: Kathreen Devoid   On: 05/02/2014 20:18   Mr Jodene Nam Head Wo Contrast  05/02/2014   CLINICAL DATA:  Leg weakness. Difficulty walking. History of lymphoma and diabetes and atrial fibrillation.  EXAM:  MRI HEAD WITHOUT AND WITH CONTRAST  MRA HEAD WITHOUT CONTRAST  TECHNIQUE: Multiplanar, multiecho pulse sequences of the brain and surrounding structures were obtained without and with intravenous contrast. Angiographic images of the head were obtained using MRA technique without contrast.  CONTRAST:  31mL MULTIHANCE GADOBENATE DIMEGLUMINE 529 MG/ML IV SOLN  COMPARISON:  CT head 05/02/2014  FINDINGS: MRI HEAD FINDINGS  Negative for acute infarct.  Moderate atrophy. Mild chronic microvascular ischemic changes in the cerebral white matter. Basal ganglia and brainstem and cerebellum normal.  Negative for mass or edema.  Normal enhancement following contrast administration. No enhancing mass lesion identified.  MRA HEAD FINDINGS  Both vertebral arteries are patent to the basilar without significant stenosis. Left vertebral artery dominant. PICA, superior cerebellar, posterior cerebral artery is patent bilaterally without stenosis. Basilar widely patent.  Internal carotid artery widely patent bilaterally. Anterior and middle cerebral arteries widely patent without stenosis  Negative for cerebral aneurysm.  IMPRESSION: Atrophy and mild chronic microvascular ischemia.  No acute infarct  Negative MRA head.   Electronically Signed   By: Franchot Gallo M.D.   On: 05/02/2014 20:14   Mr Jeri Cos KV Contrast  05/02/2014   CLINICAL DATA:  Leg weakness. Difficulty walking. History of lymphoma and diabetes and atrial fibrillation.  EXAM: MRI HEAD WITHOUT AND WITH CONTRAST  MRA HEAD WITHOUT CONTRAST  TECHNIQUE: Multiplanar, multiecho pulse sequences of the brain and surrounding structures were obtained without and with intravenous contrast. Angiographic images of the head were obtained using MRA technique without contrast.  CONTRAST:  23mL MULTIHANCE GADOBENATE DIMEGLUMINE 529 MG/ML IV SOLN  COMPARISON:  CT head 05/02/2014  FINDINGS: MRI HEAD FINDINGS  Negative for acute infarct.  Moderate atrophy. Mild chronic microvascular  ischemic changes in the cerebral white matter. Basal ganglia and brainstem and cerebellum normal.  Negative for mass or edema.  Normal enhancement following contrast administration. No enhancing mass lesion identified.  MRA HEAD FINDINGS  Both vertebral arteries are patent to the basilar without significant stenosis. Left vertebral artery dominant. PICA, superior cerebellar, posterior cerebral artery is patent bilaterally without stenosis. Basilar widely patent.  Internal carotid artery widely patent bilaterally. Anterior and middle cerebral arteries widely patent without stenosis  Negative for cerebral aneurysm.  IMPRESSION: Atrophy and mild chronic microvascular ischemia.  No acute infarct  Negative MRA head.   Electronically Signed   By: Franchot Gallo M.D.   On: 05/02/2014 20:14   Mr Cervical Spine W Wo Contrast  05/04/2014   CLINICAL DATA:  Right hemiparesis. Diffuse large B-cell lymphoma, beginning chemotherapy 12/2013.  EXAM: MRI CERVICAL SPINE WITHOUT AND WITH CONTRAST  TECHNIQUE: Multiplanar and multiecho pulse sequences of the cervical spine, to include the craniocervical junction and cervicothoracic junction, were obtained according to standard protocol without and with intravenous contrast.  CONTRAST:  15mL MULTIHANCE GADOBENATE DIMEGLUMINE 529 MG/ML IV SOLN  COMPARISON:  PET-CT 03/09/2014.  Cervical spine CT 06/29/2007.  FINDINGS: Images are mildly to moderately degraded by motion artifact.  Vertebral alignment is unchanged from prior cervical spine CT, with straightening of the normal cervical lordosis, trace anterolisthesis of C4 on C5, and trace retrolisthesis of C5 on C6. Severe disc space narrowing is again seen  at C5-6. Vertebral bone marrow signal is within normal limits aside from mild degenerative marrow changes in the mid to lower cervical spine. No enhancing lesions are identified. Craniocervical junction is unremarkable. Cervical spinal cord is normal in caliber without definite signal  abnormality identified.  C2-3:  Small central disc protrusion without stenosis.  C3-4:  Negative.  C4-5:  Negative.  C5-6: Listhesis and endplate osteophytosis result in mild spinal stenosis and mild bilateral neural foraminal stenosis.  C6-7: Minimal disc bulging and uncovertebral spurring without significant stenosis.  C7-T1: Right greater than left facet arthrosis results in minimal right neural foraminal narrowing. No spinal stenosis.  IMPRESSION: Multilevel cervical spondylosis, worst at C5-6 where there is mild spinal stenosis.   Electronically Signed   By: Logan Bores   On: 05/04/2014 14:00   Dg Chest Port 1 View  05/04/2014   CLINICAL DATA:  Aspiration.  Cough, shortness of Breath  EXAM: PORTABLE CHEST - 1 VIEW  COMPARISON:  04/12/2014  FINDINGS: Right Port-A-Cath is in place. The tip is in the SVC. Heart and mediastinal contours are within normal limits. No focal opacities or effusions. No acute bony abnormality.  IMPRESSION: No active disease.   Electronically Signed   By: Rolm Baptise M.D.   On: 05/04/2014 12:18   Dg Hips Bilat With Pelvis 2v  05/02/2014   CLINICAL DATA:  Fall.  Pain.  EXAM: DG HIP W/ PELVIS 2V BILAT  COMPARISON:  None.  FINDINGS: No acute fracture. No dislocation. Mild osteopenia. Minimal degenerative change of the right hip joint.  IMPRESSION: No acute bony pathology.   Electronically Signed   By: Maryclare Bean M.D.   On: 05/02/2014 20:14    Objective  Filed Vitals:   05/04/14 0211 05/04/14 0709 05/04/14 0948 05/04/14 1312  BP: 165/63 166/74 162/89 125/65  Pulse:  91 85 92  Temp: 97.7 F (36.5 C) 98.2 F (36.8 C) 98.2 F (36.8 C) 97.7 F (36.5 C)  TempSrc: Oral Oral Oral Oral  Resp: 16 16 16 20   SpO2: 94% 96% 93% 97%   Exam:  General:  NAD  HEENT: no scleral icterus  Cardiovascular: RRR without MRG  Respiratory: CTA biL, no wheezing  Abdomen: soft, non tender  MSK/Extremities: no clubbing or cyanosis   Skin: no rashes  Neuro: minimal decrease in  strength right arm  Data Reviewed: Basic Metabolic Panel:  Recent Labs Lab 05/02/14 1316 05/02/14 1329 05/03/14 0559 05/04/14 0615  NA 135 134* 136 135  K 4.8 4.6 3.8 3.9  CL 94* 93* 96 95*  CO2 30  --  31 31  GLUCOSE 188* 188* 143* 152*  BUN 24* 29* 18 15  CREATININE 0.80 0.80 0.76 0.69  CALCIUM 9.1  --  9.0 9.0   Liver Function Tests:  Recent Labs Lab 05/02/14 1316  AST 25  ALT 14  ALKPHOS 72  BILITOT 0.5  PROT 6.2  ALBUMIN 2.6*   CBC:  Recent Labs Lab 05/02/14 1316 05/02/14 1329 05/03/14 0559 05/04/14 0615  WBC 14.5*  --  13.1* 16.8*  NEUTROABS 12.1*  --   --   --   HGB 12.5* 13.9 11.5* 12.3*  HCT 37.1* 41.0 34.3* 36.4*  MCV 82.8  --  81.9 82.2  PLT 303  --  306 313   BNP (last 3 results)  Recent Labs  01/05/14 2142  PROBNP 343.7   CBG:  Recent Labs Lab 05/03/14 1332 05/03/14 1608 05/03/14 2116 05/04/14 0711 05/04/14 1137  GLUCAP 267* 141* 140*  156* 150*   Scheduled Meds: . allopurinol  300 mg Oral Daily  . antiseptic oral rinse  7 mL Mouth Rinse q12n4p  . aspirin  325 mg Oral Daily  . atorvastatin  10 mg Oral q1800  . chlorhexidine  15 mL Mouth Rinse BID  . diltiazem  120 mg Oral Daily  . docusate sodium  100 mg Oral BID  . enoxaparin (LOVENOX) injection  40 mg Subcutaneous Q24H  . feeding supplement (ENSURE COMPLETE)  237 mL Oral BID BM  . fluconazole  100 mg Oral Daily  . Influenza vac split quadrivalent PF  0.5 mL Intramuscular Tomorrow-1000  . insulin aspart  0-15 Units Subcutaneous TID WC  . insulin aspart  0-5 Units Subcutaneous QHS  . loratadine  10 mg Oral BH-q7a  . metoprolol  25 mg Oral BID  . miconazole  1 application Topical BID  . nystatin  5 mL Oral QID  . ondansetron  8 mg Oral Q12H  . polyethylene glycol  17 g Oral Daily  . predniSONE  10 mg Oral Q breakfast  . sodium chloride  3 mL Intravenous Q12H  . sucralfate  1 g Oral TID WC & HS   Continuous Infusions: . sodium chloride 75 mL/hr at 05/02/14 2034     Phillips Climes , MD [facility] Pager [phone] If 7 PM - 7 AM, please contact night-coverage at [email or web address], password [email or web address] 05/04/2014, 4:12 PM  LOS: 2 days

## 2014-05-05 ENCOUNTER — Encounter (HOSPITAL_COMMUNITY): Payer: Self-pay

## 2014-05-05 ENCOUNTER — Inpatient Hospital Stay (HOSPITAL_COMMUNITY): Payer: Medicare Other

## 2014-05-05 LAB — BASIC METABOLIC PANEL
ANION GAP: 8 (ref 5–15)
BUN: 20 mg/dL (ref 6–23)
CHLORIDE: 96 meq/L (ref 96–112)
CO2: 31 mmol/L (ref 19–32)
Calcium: 9 mg/dL (ref 8.4–10.5)
Creatinine, Ser: 0.9 mg/dL (ref 0.50–1.35)
GFR calc Af Amer: 87 mL/min — ABNORMAL LOW (ref >90)
GFR calc non Af Amer: 75 mL/min — ABNORMAL LOW (ref >90)
Glucose, Bld: 141 mg/dL — ABNORMAL HIGH (ref 70–99)
Potassium: 4.2 mmol/L (ref 3.5–5.1)
SODIUM: 135 mmol/L (ref 135–145)

## 2014-05-05 LAB — GLUCOSE, CAPILLARY
GLUCOSE-CAPILLARY: 131 mg/dL — AB (ref 70–99)
Glucose-Capillary: 129 mg/dL — ABNORMAL HIGH (ref 70–99)
Glucose-Capillary: 140 mg/dL — ABNORMAL HIGH (ref 70–99)
Glucose-Capillary: 152 mg/dL — ABNORMAL HIGH (ref 70–99)
Glucose-Capillary: 183 mg/dL — ABNORMAL HIGH (ref 70–99)

## 2014-05-05 LAB — CBC
HEMATOCRIT: 36.8 % — AB (ref 39.0–52.0)
Hemoglobin: 12.1 g/dL — ABNORMAL LOW (ref 13.0–17.0)
MCH: 27.9 pg (ref 26.0–34.0)
MCHC: 32.9 g/dL (ref 30.0–36.0)
MCV: 85 fL (ref 78.0–100.0)
Platelets: 280 10*3/uL (ref 150–400)
RBC: 4.33 MIL/uL (ref 4.22–5.81)
RDW: 17.3 % — AB (ref 11.5–15.5)
WBC: 16 10*3/uL — AB (ref 4.0–10.5)

## 2014-05-05 MED ORDER — SODIUM CHLORIDE 0.9 % IV BOLUS (SEPSIS)
500.0000 mL | Freq: Once | INTRAVENOUS | Status: AC
  Administered 2014-05-05: 500 mL via INTRAVENOUS

## 2014-05-05 MED ORDER — DILTIAZEM HCL 100 MG IV SOLR
5.0000 mg/h | INTRAVENOUS | Status: DC
  Administered 2014-05-05: 5 mg/h via INTRAVENOUS
  Administered 2014-05-06: 10 mg/h via INTRAVENOUS
  Filled 2014-05-05: qty 100

## 2014-05-05 MED ORDER — METOPROLOL TARTRATE 1 MG/ML IV SOLN
2.5000 mg | Freq: Once | INTRAVENOUS | Status: AC
  Administered 2014-05-05: 2.5 mg via INTRAVENOUS
  Filled 2014-05-05: qty 5

## 2014-05-05 NOTE — Progress Notes (Addendum)
Subjective: Patient continues to complain of right sided weakness.  Working with therapy and requiring 2+ assist.    Objective: Current vital signs: BP 99/53 mmHg  Pulse 94  Temp(Src) 97.7 F (36.5 C) (Oral)  Resp 20  SpO2 100% Vital signs in last 24 hours: Temp:  [97.7 F (36.5 C)-98.1 F (36.7 C)] 97.7 F (36.5 C) (01/21 1018) Pulse Rate:  [82-149] 94 (01/21 1018) Resp:  [17-20] 20 (01/21 1018) BP: (91-207)/(52-126) 99/53 mmHg (01/21 1018) SpO2:  [93 %-100 %] 100 % (01/21 1018)  Intake/Output from previous day: 01/20 0701 - 01/21 0700 In: 240 [P.O.:240] Out: -  Intake/Output this shift: Total I/O In: -  Out: 2 [Urine:1; Stool:1] Nutritional status:    Neurologic Exam: Mental Status: Alert, oriented, thought content appropriate. Speech soft but fluent. Able to follow 3 step commands without difficulty. Cranial Nerves: II: Discs flat bilaterally; Visual fields grossly normal, pupils equal, round, reactive to light and accommodation III,IV, VI: ptosis not present, extra-ocular motions intact bilaterally V,VII: facial bone structure asymmetry with smile symmetric, facial light touch sensation normal bilaterally VIII: hearing normal bilaterally IX,X: gag reflex present XI: bilateral shoulder shrug XII: midline tongue extension Motor: Patient exhibits generalized weakness but when arms lifted together has a drift of the RUE without pronation. Lower extremity testing reveals a positive Hoovers.  Sensory: Pinprick and light touch intact throughout, bilaterally Deep Tendon Reflexes: 1+ and symmetric throughout Plantars: Right: downgoingLeft: downgoing   Lab Results: Basic Metabolic Panel:  Recent Labs Lab 05/02/14 1316 05/02/14 1329 05/03/14 0559 05/04/14 0615 05/05/14 0530  NA 135 134* 136 135 135  K 4.8 4.6 3.8 3.9 4.2  CL 94* 93* 96 95* 96  CO2 30  --  _0 GLUCOSE 188* 188* 143* 152* 141*  BUN 24* 29* _1 CREATININE 0.80 0.80 0.76 0.69 0.90  CALCIUM 9.1  --  9.0 9.0 9.0    Liver Function Tests:  Recent Labs Lab 05/02/14 1316  AST 25  ALT 14  ALKPHOS 72  BILITOT 0.5  PROT 6.2  ALBUMIN 2.6*   No results for input(s): LIPASE, AMYLASE in the last 168 hours. No results for input(s): AMMONIA in the last 168 hours.  CBC:  Recent Labs Lab 05/02/14 1316 05/02/14 1329 05/03/14 0559 05/04/14 0615 05/05/14 0530  WBC 14.5*  --  13.1* 16.8* 16.0*  NEUTROABS 12.1*  --   --   --   --   HGB 12.5* 13.9 11.5* 12.3* 12.1*  HCT 37.1* 41.0 34.3* 36.4* 36.8*  MCV 82.8  --  81.9 82.2 85.0  PLT 303  --  306 313 280    Cardiac Enzymes: No results for input(s): CKTOTAL, CKMB, CKMBINDEX, TROPONINI in the last 168 hours.  Lipid Panel:  Recent Labs Lab 05/03/14 0559  CHOL 111  TRIG 111  HDL 38*  CHOLHDL 2.9  VLDL 22  LDLCALC 51    CBG:  Recent Labs Lab 05/04/14 0711 05/04/14 1137 05/04/14 1725 05/05/14 0003 05/05/14 0642  GLUCAP 156* 150* 175* 183* 152*    Microbiology: Results for orders placed or performed during the hospital encounter of 04/12/14  Blood Culture (routine x 2)     Status: None   Collection Time: 04/12/14  6:34 PM  Result Value Ref Range Status   Specimen Description BLOOD PAC  Final   Special Requests BOTTLES DRAWN AEROBIC AND ANAEROBIC 5CC  Final   Culture   Final    NO GROWTH 5 DAYS  Performed at [facility]    Report Status 04/19/2014 FINAL  Final  Blood Culture (routine x 2)     Status: None   Collection Time: 04/12/14  7:24 PM  Result Value Ref Range Status   Specimen Description BLOOD RIGHT ANTECUBITAL  Final   Special Requests BOTTLES DRAWN AEROBIC AND ANAEROBIC 5CC  Final   Culture   Final    NO GROWTH 5 DAYS Performed at [facility]    Report Status 04/19/2014 FINAL  Final  Urine culture     Status: None   Collection Time: 04/12/14  9:17 PM  Result Value Ref Range Status   Specimen Description URINE, CLEAN CATCH   Final   Special Requests NONE  Final   Colony Count NO GROWTH Performed at [facility]   Final   Culture NO GROWTH Performed at [facility]   Final   Report Status 04/13/2014 FINAL  Final  Rapid strep screen     Status: None   Collection Time: 04/12/14  9:23 PM  Result Value Ref Range Status   Streptococcus, Group A Screen (Direct) NEGATIVE NEGATIVE Final    Comment: (NOTE) A Rapid Antigen test may result negative if the antigen level in the sample is below the detection level of this test. The FDA has not cleared this test as a stand-alone test therefore the rapid antigen negative result has reflexed to a Group A Strep culture.   Culture, Group A Strep     Status: None   Collection Time: 04/12/14  9:23 PM  Result Value Ref Range Status   Specimen Description THROAT  Final   Special Requests NONE  Final   Culture   Final    No Beta Hemolytic Streptococci Isolated Performed at [facility]    Report Status 04/14/2014 FINAL  Final  Respiratory virus panel (routine influenza)     Status: None   Collection Time: 04/13/14  2:14 AM  Result Value Ref Range Status   Source - [facility] NASAL WASHINGS  Corrected    Comment: CORRECTED ON 12/30 AT 1834: PREVIOUSLY REPORTED AS NASAL WASHINGS   Respiratory Syncytial Virus A NOT DETECTED  Final   Respiratory Syncytial Virus B NOT DETECTED  Final   Influenza A NOT DETECTED  Final   Influenza B NOT DETECTED  Final   Parainfluenza 1 NOT DETECTED  Final   Parainfluenza 2 NOT DETECTED  Final   Parainfluenza 3 NOT DETECTED  Final   Metapneumovirus NOT DETECTED  Final   Rhinovirus NOT DETECTED  Final   Adenovirus NOT DETECTED  Final   Influenza A H1 NOT DETECTED  Final   Influenza A H3 NOT DETECTED  Final    Comment: (NOTE)       Normal Reference Range for each Analyte: NOT DETECTED Testing performed using the Lumine[employer] xTAG Respiratory Viral Panel test kit. The analytical performance characteristics of this assay have  been determined by [facility].  The modifications have not been cleared or approved by the FDA. This assay has been validated pursuant to the CLIA regulations and is used for clinical purposes. Performed at [facility]     Coagulation Studies:  Recent Labs  05/02/14 1316  LABPROT 14.0  INR 1.07    Imaging: Ct Head Wo Contrast  05/05/2014   CLINICAL DATA:  Dysarthria, worsening RIGHT-sided weakness, RIGHT arm pain. Hypertension.  EXAM: CT HEAD WITHOUT CONTRAST  TECHNIQUE: Contiguous axial images were obtained from the base  of the skull through the vertex without intravenous contrast.  COMPARISON:  MRI of the head May 02, 2014  FINDINGS: The ventricles and sulci are normal for age. No intraparenchymal hemorrhage, mass effect nor midline shift. Patchy supratentorial white matter hypodensities are within normal range for patient's age and though non-specific suggest sequelae of chronic small vessel ischemic disease. No acute large vascular territory infarcts.  No abnormal extra-axial fluid collections. Basal cisterns are patent. Moderate calcific atherosclerosis of the carotid siphons.  No skull fracture. The included ocular globes and orbital contents are non-suspicious. The mastoid aircells and included paranasal sinuses are well-aerated.  IMPRESSION: No acute intracranial process.  Involutional changes. Similar mild to moderate white matter changes can be seen with chronic small vessel ischemic disease.   Electronically Signed   By: [name]   On: 05/05/2014 00:41   Mr Cervical Spine W Wo Contrast  05/04/2014   CLINICAL DATA:  Right hemiparesis. Diffuse large B-cell lymphoma, beginning chemotherapy 12/2013.  EXAM: MRI CERVICAL SPINE WITHOUT AND WITH CONTRAST  TECHNIQUE: Multiplanar and multiecho pulse sequences of the cervical spine, to include the craniocervical junction and cervicothoracic junction, were obtained according to standard protocol without and with  intravenous contrast.  CONTRAST:  62m MULTIHANCE GADOBENATE DIMEGLUMINE 529 MG/ML IV SOLN  COMPARISON:  PET-CT 03/09/2014.  Cervical spine CT 06/29/2007.  FINDINGS: Images are mildly to moderately degraded by motion artifact.  Vertebral alignment is unchanged from prior cervical spine CT, with straightening of the normal cervical lordosis, trace anterolisthesis of C4 on C5, and trace retrolisthesis of C5 on C6. Severe disc space narrowing is again seen at C5-6. Vertebral bone marrow signal is within normal limits aside from mild degenerative marrow changes in the mid to lower cervical spine. No enhancing lesions are identified. Craniocervical junction is unremarkable. Cervical spinal cord is normal in caliber without definite signal abnormality identified.  C2-3:  Small central disc protrusion without stenosis.  C3-4:  Negative.  C4-5:  Negative.  C5-6: Listhesis and endplate osteophytosis result in mild spinal stenosis and mild bilateral neural foraminal stenosis.  C6-7: Minimal disc bulging and uncovertebral spurring without significant stenosis.  C7-T1: Right greater than left facet arthrosis results in minimal right neural foraminal narrowing. No spinal stenosis.  IMPRESSION: Multilevel cervical spondylosis, worst at C5-6 where there is mild spinal stenosis.   Electronically Signed   By: ALogan Bores  On: 05/04/2014 14:00   Dg Chest Port 1 View  05/04/2014   CLINICAL DATA:  Aspiration.  Cough, shortness of Breath  EXAM: PORTABLE CHEST - 1 VIEW  COMPARISON:  04/12/2014  FINDINGS: Right Port-A-Cath is in place. The tip is in the SVC. Heart and mediastinal contours are within normal limits. No focal opacities or effusions. No acute bony abnormality.  IMPRESSION: No active disease.   Electronically Signed   By: KRolm BaptiseM.D.   On: 05/04/2014 12:18    Medications:  I have reviewed the patient's current medications. Scheduled: . allopurinol  300 mg Oral Daily  . antiseptic oral rinse  7 mL Mouth Rinse  q12n4p  . aspirin  325 mg Oral Daily  . atorvastatin  10 mg Oral q1800  . chlorhexidine  15 mL Mouth Rinse BID  . diltiazem  120 mg Oral Daily  . docusate sodium  100 mg Oral BID  . enoxaparin (LOVENOX) injection  40 mg Subcutaneous Q24H  . feeding supplement (ENSURE COMPLETE)  237 mL Oral BID BM  . fluconazole  100 mg Oral Daily  .  Influenza vac split quadrivalent PF  0.5 mL Intramuscular Tomorrow-1000  . insulin aspart  0-15 Units Subcutaneous TID WC  . loratadine  10 mg Oral BH-q7a  . metoprolol  25 mg Oral BID  . miconazole  1 application Topical BID  . nystatin  5 mL Oral QID  . ondansetron  8 mg Oral Q12H  . polyethylene glycol  17 g Oral Daily  . predniSONE  10 mg Oral Q breakfast  . sodium chloride  3 mL Intravenous Q12H  . sucralfate  1 g Oral TID WC & HS    Assessment/Plan: Complaints of weakness continue.  Neurological examination continues to have multiple functional features as well.  MRI of the cervical spine personally reviewed and shows diffuse cervical spondylosis, most severe at C5-6 without significant spinal canal compromise.  No metastatic lesions noted.    Recommendations: 1.  Continue therapy 2.  Case discussed with Dr. Waldron Labs   LOS: 3 days   Alexis Goodell, MD [facility] [phone] 05/05/2014  11:22 AM

## 2014-05-05 NOTE — Progress Notes (Signed)
PROGRESS NOTE  Erik Ramirez JOA:[id number] DOB: 1926-03-07 DOA: 05/02/2014 PCP: Pcp Not In System  HPI: 81 year old male with history of diffuse large B-cell lymphoma on chemotherapy, diabetes mellitus, paroxysmal A. fib, hypertension, obstructive sleep apnea was recently hospitalized for neutropenic fever and anemia, elevated troponin due to demand ischemia, and was discharged home with home health. Patient reports that he has been feeling weak since his discharge and not having enough strength in his legs. He fell in his bathroom about 10 days back and says that his legs gave away. He thinks he may have hit his right scapula, hip and right knee and leg. Since then he has been progressively feeling weak and having pain in his right shoulder, hips and the right leg. For past 2-3 days he reports that his right leg feels like "lead ". He denies any syncope, headache or dizziness. He denies any tingling or numbness of his extremities. Denies any bowel or urinary symptoms. Denies any blurred vision. He reports poor appetite and difficulty swallowing due to oral thrush. Patient denies fever, chills, nausea , vomiting, chest pain, palpitations, SOB, abdominal pain, bowel or urinary symptoms. As reported that his right hand has been week for past 1 week. Pt had presented for outpatient swallow eval today and was referred to ED given complains of right-sided weakness.  Subjective / 24 H Interval events Pain better this morning, and an episode of A. fib with RVR yesterday evening, responded to IV metoprolol and mild fluid bolus.  Assessment/Plan: Principal Problem:   CVA (cerebral vascular accident) Active Problems:   Diabetes mellitus without complication   DLBCL (diffuse large B cell lymphoma)   CKD (chronic kidney disease), stage III   Mucositis due to antineoplastic therapy   Anemia in neoplastic disease   Protein-calorie malnutrition, severe   Fall at home   Weakness generalized   Right  hemiparesis  Rt sided weakness - Neurology consulted, appreciate input. Symptoms was felt more likely secondary to generalized weakness in the absence of significant finding on the imaging, as no metastatic disease or acute CVA was on imaging - MRI brain is negative, - MRI C spine showing Multilevel cervical spondylosis, worst at C5-6 where there is mild spinal stenosis, discussed with neurosurgery on call Dr. Ronnald Ramp,  these findings wouldn't cause the patient symptoms. - subjectively better - Discussed with oncology on call, CNS involvement is unlikely with B-cell lymphoma - Carotid Doppler no significant stenosis - 2-D echo showing EF of 35% with grade 1 diastolic dysfunction. - PT/OT eval., improving, for CIR placement.  Dysphagia - SLP evaluation appreciated   Right shoulder and hip pain - Secondary to fall. No fractures on XR  Leukocytosis - Most likely related to steroids, around baseline, x-ray showing no opacity or infiltrate, urinalysis is  Negative 1/18  DLBCL (diffuse large B cell lymphoma)  - Following with Dr. Jana Ramirez as outpatient and on chemotherapy.  Diabetes mellitus type 2  continue SSI  CKD (chronic kidney disease), stage III  Renal function stable.  Paroxysmal A. Fib  Currently in sinus. Continue aspirin and beta blocker and diltiazem  Mucositis due to antineoplastic therapy  As diffuse oral thrush. Continue fluconazole and nystatin.  Anemia in neoplastic disease  H&H stable  Protein-calorie malnutrition, severe  conitnue supplements  HTN  continue home medications, and hydralazine PRN  Diet: DIET - DYS 1 Fluids: none DVT Prophylaxis: Lovenox  Code Status: Full Code Family Communication: Spoke with both sons yesterday at bedside in detail, explained results,  plan of care, as well spoke to son over the phone this morning. disposition plan: For CIR placement in a.m   Consults  Neurology   Procedures:  None     Antibiotics  Anti-infectives    Start     Dose/Rate Route Frequency Ordered Stop   05/02/14 1830  fluconazole (DIFLUCAN) tablet 100 mg     100 mg Oral Daily 05/02/14 1821         Studies  Ct Head Wo Contrast  05/05/2014   CLINICAL DATA:  Dysarthria, worsening RIGHT-sided weakness, RIGHT arm pain. Hypertension.  EXAM: CT HEAD WITHOUT CONTRAST  TECHNIQUE: Contiguous axial images were obtained from the base of the skull through the vertex without intravenous contrast.  COMPARISON:  MRI of the head May 02, 2014  FINDINGS: The ventricles and sulci are normal for age. No intraparenchymal hemorrhage, mass effect nor midline shift. Patchy supratentorial white matter hypodensities are within normal range for patient's age and though non-specific suggest sequelae of chronic small vessel ischemic disease. No acute large vascular territory infarcts.  No abnormal extra-axial fluid collections. Basal cisterns are patent. Moderate calcific atherosclerosis of the carotid siphons.  No skull fracture. The included ocular globes and orbital contents are non-suspicious. The mastoid aircells and included paranasal sinuses are well-aerated.  IMPRESSION: No acute intracranial process.  Involutional changes. Similar mild to moderate white matter changes can be seen with chronic small vessel ischemic disease.   Electronically Signed   By: [name]   On: 05/05/2014 00:41   Mr Cervical Spine W Wo Contrast  05/04/2014   CLINICAL DATA:  Right hemiparesis. Diffuse large B-cell lymphoma, beginning chemotherapy 12/2013.  EXAM: MRI CERVICAL SPINE WITHOUT AND WITH CONTRAST  TECHNIQUE: Multiplanar and multiecho pulse sequences of the cervical spine, to include the craniocervical junction and cervicothoracic junction, were obtained according to standard protocol without and with intravenous contrast.  CONTRAST:  80mL MULTIHANCE GADOBENATE DIMEGLUMINE 529 MG/ML IV SOLN  COMPARISON:  PET-CT 03/09/2014.  Cervical spine  CT 06/29/2007.  FINDINGS: Images are mildly to moderately degraded by motion artifact.  Vertebral alignment is unchanged from prior cervical spine CT, with straightening of the normal cervical lordosis, trace anterolisthesis of C4 on C5, and trace retrolisthesis of C5 on C6. Severe disc space narrowing is again seen at C5-6. Vertebral bone marrow signal is within normal limits aside from mild degenerative marrow changes in the mid to lower cervical spine. No enhancing lesions are identified. Craniocervical junction is unremarkable. Cervical spinal cord is normal in caliber without definite signal abnormality identified.  C2-3:  Small central disc protrusion without stenosis.  C3-4:  Negative.  C4-5:  Negative.  C5-6: Listhesis and endplate osteophytosis result in mild spinal stenosis and mild bilateral neural foraminal stenosis.  C6-7: Minimal disc bulging and uncovertebral spurring without significant stenosis.  C7-T1: Right greater than left facet arthrosis results in minimal right neural foraminal narrowing. No spinal stenosis.  IMPRESSION: Multilevel cervical spondylosis, worst at C5-6 where there is mild spinal stenosis.   Electronically Signed   By: Logan Bores   On: 05/04/2014 14:00   Dg Chest Port 1 View  05/04/2014   CLINICAL DATA:  Aspiration.  Cough, shortness of Breath  EXAM: PORTABLE CHEST - 1 VIEW  COMPARISON:  04/12/2014  FINDINGS: Right Port-A-Cath is in place. The tip is in the SVC. Heart and mediastinal contours are within normal limits. No focal opacities or effusions. No acute bony abnormality.  IMPRESSION: No active disease.   Electronically Signed  By: Rolm Baptise M.D.   On: 05/04/2014 12:18    Objective  Filed Vitals:   05/04/14 2330 05/05/14 0500 05/05/14 1018 05/05/14 1449  BP: 91/52 121/62 99/53 161/84  Pulse:  92 94 97  Temp:  98.1 F (36.7 C) 97.7 F (36.5 C) 98.4 F (36.9 C)  TempSrc:  Oral Oral Oral  Resp:  18 20 20   SpO2:  93% 100% 97%   Exam:  General:   NAD  HEENT: no scleral icterus  Cardiovascular: RRR without MRG  Respiratory: CTA biL, no wheezing  Abdomen: soft, non tender  MSK/Extremities: no clubbing or cyanosis   Skin: no rashes  Neuro: minimal decrease in strength right arm  Data Reviewed: Basic Metabolic Panel:  Recent Labs Lab 05/02/14 1316 05/02/14 1329 05/03/14 0559 05/04/14 0615 05/05/14 0530  NA 135 134* 136 135 135  K 4.8 4.6 3.8 3.9 4.2  CL 94* 93* 96 95* 96  CO2 30  --  31 31 31   GLUCOSE 188* 188* 143* 152* 141*  BUN 24* 29* 18 15 20   CREATININE 0.80 0.80 0.76 0.69 0.90  CALCIUM 9.1  --  9.0 9.0 9.0   Liver Function Tests:  Recent Labs Lab 05/02/14 1316  AST 25  ALT 14  ALKPHOS 72  BILITOT 0.5  PROT 6.2  ALBUMIN 2.6*   CBC:  Recent Labs Lab 05/02/14 1316 05/02/14 1329 05/03/14 0559 05/04/14 0615 05/05/14 0530  WBC 14.5*  --  13.1* 16.8* 16.0*  NEUTROABS 12.1*  --   --   --   --   HGB 12.5* 13.9 11.5* 12.3* 12.1*  HCT 37.1* 41.0 34.3* 36.4* 36.8*  MCV 82.8  --  81.9 82.2 85.0  PLT 303  --  306 313 280   BNP (last 3 results)  Recent Labs  01/05/14 2142  PROBNP 343.7   CBG:  Recent Labs Lab 05/04/14 1137 05/04/14 1725 05/05/14 0003 05/05/14 0642 05/05/14 1124  GLUCAP 150* 175* 183* 152* 131*   Scheduled Meds: . allopurinol  300 mg Oral Daily  . antiseptic oral rinse  7 mL Mouth Rinse q12n4p  . aspirin  325 mg Oral Daily  . atorvastatin  10 mg Oral q1800  . chlorhexidine  15 mL Mouth Rinse BID  . diltiazem  120 mg Oral Daily  . docusate sodium  100 mg Oral BID  . enoxaparin (LOVENOX) injection  40 mg Subcutaneous Q24H  . feeding supplement (ENSURE COMPLETE)  237 mL Oral BID BM  . fluconazole  100 mg Oral Daily  . Influenza vac split quadrivalent PF  0.5 mL Intramuscular Tomorrow-1000  . insulin aspart  0-15 Units Subcutaneous TID WC  . loratadine  10 mg Oral BH-q7a  . metoprolol  25 mg Oral BID  . miconazole  1 application Topical BID  . nystatin  5 mL Oral  QID  . ondansetron  8 mg Oral Q12H  . polyethylene glycol  17 g Oral Daily  . predniSONE  10 mg Oral Q breakfast  . sodium chloride  3 mL Intravenous Q12H  . sucralfate  1 g Oral TID WC & HS   Continuous Infusions: . sodium chloride 75 mL/hr at 05/02/14 2034    Phillips Climes , MD [facility] Pager [phone] If 7 PM - 7 AM, please contact night-coverage at [email or web address], password [facility] 05/05/2014, 3:47 PM  LOS: 3 days

## 2014-05-05 NOTE — Progress Notes (Signed)
Speech Language Pathology Treatment: Dysphagia  Patient Details Name: Erik Ramirez MRN: [id number] DOB: 23-Jul-1925 Today's Date: 05/05/2014 Time: 0370-4888 SLP Time Calculation (min) (ACUTE ONLY): 55 min  Assessment / Plan / Recommendation Clinical Impression  MD paged SLP to request reevaluation of swallow function and safety. Pt reports improved strength today. Voice quality hoarse. Pt noted to spit out phlegm several times prior to and after po trials. Oral care completed with suction. Concern for aspiration of thin liquids continues to be noted during trials of water, however, overall improvement in presentation based on previous SLP note. Pt appears to tolerate puree, soft solid, and nectar thick consistencies without overt s/s aspiration or clinical indication of airway compromise. Recommend resumption of dys 2 diet with nectar thick liquids, with strict adherence to posted precautions. Will repeat MBS tomorrow. RN, pt, family aware. MD paged with recommendations. ST to follow.   HPI HPI: 81 year old male with h/o HTN, anxiety, lymphoma, sleep apnea, DM, anemia seen for OP MBS due to c/o dysphagia x 1 week. Patient and son providing history which includes also within the week, new onset right arm weakness and numbness, right leg numbness, significant generalized weakness, severe vocal quality changes, falls. SLP confirms severe dysphonia (almost aphonic) as well as right sided facial droop.    Pertinent Vitals Pain Assessment: No/denies pain  SLP Plan  Continue with current plan of care    Recommendations Diet recommendations: Dysphagia 2 (fine chop);Nectar-thick liquid Liquids provided via: Cup;No straw Medication Administration: Crushed with puree Supervision: Patient able to self feed;Full supervision/cueing for compensatory strategies Compensations: Slow rate;Small sips/bites;Multiple dry swallows after each bite/sip;Clear throat after each swallow Postural Changes and/or Swallow  Maneuvers: Seated upright 90 degrees;Upright 30-60 min after meal              General recommendations: Rehab consult Oral Care Recommendations: Oral care BID Plan: Continue with current plan of care    GO   Dontay Harm B. Quentin Ore [facility], CCC-SLP [phone]  Shonna Chock 05/05/2014, 1:25 PM

## 2014-05-05 NOTE — Progress Notes (Signed)
I met with pt, his wife, and daughter in law at bedside. We discussed an inpt rehab admission. They are all in agreement. I can plan admit Friday if medical workup is complete. [phone]

## 2014-05-05 NOTE — Progress Notes (Signed)
Patient has refused the CPAP machine for tonight. Patient says he is resting fine on the 2 lpm [place]. Patient is in no apparent distress. Patient is aware to call at any time if he does change his mind about wearing the machine tonight. RT will continue to assist as needed.

## 2014-05-05 NOTE — Progress Notes (Signed)
Physical Therapy Treatment Patient Details Name: Erik Ramirez MRN: [id number] DOB: 10/23/1925 Today's Date: 05/05/2014    History of Present Illness Adm 05/02/14 with Rt sided weakness. Pt with multiple falls since d/c home from hospital early January (last fell 1/10 in bathroom, per son) Xrays Rt shoulder, pelvis/hips negative; MRI brain negative. MRI cervical spine +mild spinal stenosis C5-6 PMHx- FTT post chemotherapy treatments for lymphoma, DM, afib, HTN, BPPV    PT Comments    Noted overnight events. Pt wanting to get on [facility] and did well with use of steady to prevent knee buckling (see below for details). Remains very motivated and wants to regain strength and maximize his independence.   Follow Up Recommendations  CIR;Supervision/Assistance - 24 hour     Equipment Recommendations  None recommended by PT    Recommendations for Other Services OT consult     Precautions / Restrictions Precautions Precautions: Fall    Mobility  Bed Mobility Overal bed mobility: Needs Assistance Bed Mobility: Rolling;Sidelying to Sit Rolling: Supervision Sidelying to sit: Min assist       General bed mobility comments: with heavy reliance on rail; vc for technique; assist due to weakness  Transfers Overall transfer level: Needs assistance Equipment used:  (Steady) Transfers: Sit to/from Stand Sit to Stand: Mod assist;+2 safety/equipment;From elevated surface         General transfer comment: x4 with steady (from bed, BSC, and steady seat); +2 for safety/lines and pericare  Ambulation/Gait                 Stairs            Wheelchair Mobility    Modified Rankin (Stroke Patients Only) Modified Rankin (Stroke Patients Only) Pre-Morbid Rankin Score: Moderately severe disability Modified Rankin: Severe disability     Balance Overall balance assessment: Needs assistance   Sitting balance-Leahy Scale: Poor Sitting balance - Comments: able to support himself  with LUE only x 5 seconds; sat EOB with minguard to min assist x 7 minutes   Standing balance support: Bilateral upper extremity supported Standing balance-Leahy Scale: Poor Standing balance comment: stood up to 50 seconds in steady, bil knees blocked with shin pad, heavy reliance on bil UE support on cross bar; pt pitching forward during pericare with assist to bring him back over his BOS                    Cognition Arousal/Alertness: Awake/alert Behavior During Therapy: WFL for tasks assessed/performed Overall Cognitive Status: Within Functional Limits for tasks assessed                      Exercises      General Comments General comments (skin integrity, edema, etc.): Nurse tech assisted with transfer to [facility] and pericare      Pertinent Vitals/Pain Pain Assessment: No/denies pain    Home Living                      Prior Function            PT Goals (current goals can now be found in the care plan section) Acute Rehab PT Goals Patient Stated Goal: get more strength Progress towards PT goals: Progressing toward goals    Frequency  Min 3X/week    PT Plan Current plan remains appropriate    Co-evaluation             End of Session Equipment Utilized During Treatment: Other (  comment) (steady) Activity Tolerance: Patient tolerated treatment well Patient left: with nursing/sitter in room;in chair;with chair alarm set     Time: [id number] PT Time Calculation (min) (ACUTE ONLY): 20 min  Charges:  $Therapeutic Activity: 8-22 mins                    G Codes:      Roxie Gueye 2014-05-20, 9:52 AM Pager [phone]

## 2014-05-05 NOTE — Evaluation (Signed)
Occupational Therapy Evaluation Patient Details Name: Erik Ramirez MRN: [id number] DOB: [birth year] Today's Date: 05/05/2014    History of Present Illness Adm 05/02/14 with Rt sided weakness. Pt with multiple falls since d/c home from hospital early January (last fell 1/10 in bathroom, per son) Xrays Rt shoulder, pelvis/hips negative; MRI brain negative. MRI cervical spine +mild spinal stenosis C5-6 PMHx- FTT post chemotherapy treatments for lymphoma, DM, afib, HTN, BPPV   Clinical Impression   Patient required assistance immediately PTA, but prior to 2 weeks ago, patient was independent. Patient currently requires up to max assist +2 for ADLs and functional mobility/transfers. Patient will benefit from acute OT to increase overall independence in the areas of ADLs, functional mobility in order to safely discharge to CIR.     Follow Up Recommendations  CIR;Supervision/Assistance - 24 hour    Equipment Recommendations   (tbd)    Recommendations for Other Services  None at this time     Precautions / Restrictions Precautions Precautions: Fall Restrictions Weight Bearing Restrictions: No      Mobility Bed Mobility Overal bed mobility: Needs Assistance Bed Mobility: Rolling;Supine to Sit Rolling: Supervision   Supine to sit: Min assist     General bed mobility comments: using bed rails  Transfers General transfer comment: No transfer OOB during OT eval/treat    Balance Overall balance assessment: Needs assistance Sitting-balance support: Single extremity supported Sitting balance-Leahy Scale: Poor Sitting balance - Comments: patient required steady assist during LB dressing seated EOB, pt unable to support himself independently     ADL Overall ADL's : Needs assistance/impaired         Upper Body Bathing: Sitting;Min guard   Lower Body Bathing: Sitting/lateral leans;Maximal assistance   Upper Body Dressing : Min guard;Sitting   Lower Body Dressing: Maximal  assistance;Sitting/lateral leans                 General ADL Comments: Patient did not complete transfers during OT eval/treat. Patient donned underwear with max assist from therapist. Educated on threading LLE first secondary to increased weakness throughout LLE. Educated pt and family on importance of a HEP > RUE secondary to decreased strength/endurance throughout.               Pertinent Vitals/Pain Pain Assessment: No/denies pain     Hand Dominance Right   Extremity/Trunk Assessment Upper Extremity Assessment Upper Extremity Assessment: RUE deficits/detail;LUE deficits/detail RUE Deficits / Details: shoulder flexion=2+, elbow flexion=2-, elbow extension=2, grip=3+ LUE Deficits / Details: Grossly 3+-4/5 for LUE   Lower Extremity Assessment Lower Extremity Assessment: Defer to PT evaluation   Cervical / Trunk Assessment Cervical / Trunk Assessment: Kyphotic   Communication Communication Communication: Expressive difficulties (low volume)   Cognition Arousal/Alertness: Lethargic;Suspect due to medications Behavior During Therapy: [facility] for tasks assessed/performed Overall Cognitive Status: Within Functional Limits for tasks assessed              Home Living Family/patient expects to be discharged to:: Private residence Living Arrangements: Spouse/significant other Available Help at Discharge: Family;Available 24 hours/day (son lives nearby and is available prn) Type of Home: House Home Access: Stairs to enter [employer] of Steps: 1-2 steps   Home Layout: Two level Alternate Level Stairs-Number of Steps: 2 Alternate Level Stairs-Rails: None Bathroom Shower/Tub: Tub/shower unit;Curtain   Bathroom Toilet: Standard     Home Equipment: Environmental consultant - 2 wheels;Wheelchair - manual;Bedside commode          Prior Functioning/Environment Level of Independence: Needs assistance  Gait /  Transfers Assistance Needed: only walking with HHPT or his sons due  to frequent Lt knee buckling and near falls (52 yo wife cannot safely assist him--she calls her sons to help pt to transfer each time); 1 son carries pt up/down 2 interior steps ADL's / Homemaking Assistance Needed: Prior to 2 weeks ago, patient was independent PTA; since admission 2 weeks ago, patient required more assistance        OT Diagnosis: Generalized weakness;Hemiplegia dominant side   OT Problem List: Decreased strength;Decreased range of motion;Decreased activity tolerance;Impaired balance (sitting and/or standing);Decreased coordination;Decreased safety awareness;Decreased knowledge of use of DME or AE   OT Treatment/Interventions: Self-care/ADL training;Patient/family education;Therapeutic activities;DME and/or AE instruction;Therapeutic exercise;Energy conservation;Balance training    OT Goals(Current goals can be found in the care plan section) Acute Rehab OT Goals Patient Stated Goal: get more strength OT Goal Formulation: With patient Time For Goal Achievement: 05/19/14 Potential to Achieve Goals: Good ADL Goals Pt Will Transfer to Toilet: with min assist;bedside commode;grab bars Pt/caregiver will Perform Home Exercise Program: Both right and left upper extremity;Increased strength;Increased ROM;With written HEP provided Additional ADL Goal #1: Patient will maintain dynamic sitting balance EOB for at least 15 minutes with supervision in order to increase independence with completing ADLs  OT Frequency: Min 2X/week   Barriers to D/C:    None known at this time          End of Session    Activity Tolerance: Patient tolerated treatment well Patient left: in bed;with call bell/phone within reach;with family/visitor present   Time: 4196-2229 OT Time Calculation (min): 25 min Charges:  OT General Charges $OT Visit: 1 Procedure OT Evaluation $Initial OT Evaluation Tier I: 1 Procedure OT Treatments $Self Care/Home Management : 8-22 mins  Huyen Perazzo , MS,  OTR/L, CLT Pager: [phone]  05/05/2014, 2:36 PM

## 2014-05-05 NOTE — Progress Notes (Signed)
Patient noted to be back in Sinus Rhythm at 90's and sys BP in 100's-110's.

## 2014-05-06 ENCOUNTER — Inpatient Hospital Stay (HOSPITAL_COMMUNITY): Payer: Medicare Other

## 2014-05-06 DIAGNOSIS — R1312 Dysphagia, oropharyngeal phase: Secondary | ICD-10-CM | POA: Insufficient documentation

## 2014-05-06 DIAGNOSIS — I480 Paroxysmal atrial fibrillation: Secondary | ICD-10-CM

## 2014-05-06 DIAGNOSIS — R633 Feeding difficulties, unspecified: Secondary | ICD-10-CM | POA: Insufficient documentation

## 2014-05-06 LAB — GLUCOSE, CAPILLARY
GLUCOSE-CAPILLARY: 126 mg/dL — AB (ref 70–99)
Glucose-Capillary: 168 mg/dL — ABNORMAL HIGH (ref 70–99)

## 2014-05-06 MED ORDER — DOCUSATE SODIUM 50 MG/5ML PO LIQD
100.0000 mg | Freq: Two times a day (BID) | ORAL | Status: DC
  Administered 2014-05-07 (×2): 100 mg via ORAL
  Filled 2014-05-06 (×2): qty 10

## 2014-05-06 MED ORDER — SODIUM CHLORIDE 0.9 % IV BOLUS (SEPSIS)
500.0000 mL | Freq: Once | INTRAVENOUS | Status: AC
  Administered 2014-05-06: 500 mL via INTRAVENOUS

## 2014-05-06 MED ORDER — FLECAINIDE ACETATE 100 MG PO TABS
300.0000 mg | ORAL_TABLET | Freq: Once | ORAL | Status: AC
  Administered 2014-05-06: 300 mg via ORAL
  Filled 2014-05-06 (×2): qty 3

## 2014-05-06 MED ORDER — JEVITY 1.2 CAL PO LIQD
1000.0000 mL | ORAL | Status: DC
Start: 2014-05-06 — End: 2014-05-07
  Administered 2014-05-07: 1000 mL
  Filled 2014-05-06: qty 1000

## 2014-05-06 NOTE — Progress Notes (Addendum)
Patient became unstable with sustained A. Fib. andd HR above 150 BPM contacted T. Challahan he ordered a 12 lead EKG this test indicated Atrial Fibrillation with HR and a HR 150 he ordered a Cardizem drip which I was told by the Charge Nurse could not be performed on 4 N. Consequently an order was given by T. Challahan to move Patient to another floorcto receive treatment. I called report to the nurse taking over his care on 3 W and the patients belongings were collected by the tech. We then transported the patient to 3 W room 21. I contacted Pilar Plate a family member to inform him of the patients move with room number he was ok with this event and was coming in in the morning to see him.

## 2014-05-06 NOTE — Consult Note (Signed)
CARDIOLOGY CONSULT NOTE       Patient ID: Erik Ramirez MRN: [id number] DOB/AGE: [id number] 79 y.o.  Admit date: 05/02/2014 Referring Physician:  Elgergawy Primary Physician: Pcp Not In System Primary Cardiologist:  Brackbill/ Nahser Reason for Consultation:  Rapid Afib  Principal Problem:   CVA (cerebral vascular accident) Active Problems:   Diabetes mellitus without complication   DLBCL (diffuse large B cell lymphoma)   CKD (chronic kidney disease), stage III   Mucositis due to antineoplastic therapy   Anemia in neoplastic disease   Protein-calorie malnutrition, severe   Fall at home   Weakness generalized   Right hemiparesis   HPI:   This 81 year old gentleman  patient of Dr. Griffith Citron, was admitted on 04/12/14 because of shortness of breath sore throat myalgias and fever. He had failed to improve on outpatient ciprofloxacin. Chest x-ray did not show any consolidation. He had an absolute neutrophil count of 506. His hemoglobin on admission was 6.5 and he was transfused with 2 units of packed cells. He has noted some dyspnea on exertion with slight left-sided chest discomfort.  Seen by Dr Mare Ferrari in consult 12/29 for elevated troponin The patient does not have any history of known ischemic heart disease. He states that ever since he has been taking chemotherapy he has had some problems with left-sided chest discomfort which is relieved by belching. Patient has a past history of paroxysmal atrial fibrillation. He is not a candidate for anticoagulation because of his thrombocytopenia. He is presently in normal sinus rhythm. Troponins on this admission have been slightly elevated but stable at 0.12, 0.12, and 0.11. The patient denies any chest discomfort at the present time. His electrocardiogram on this  Echo showed normal LV function and given lack of chest pain and comorbidity no further w/u done  Readmitted 1/18 with right sided weakness  CT/MRI negative for acute stoke   In NSR on admission  Went into afib early this morning  Asymptomatic  Getting ready to go for swallowing study   ROS All other systems reviewed and negative except as noted above  Past Medical History  Diagnosis Date  . Hypertension   . Hyperlipemia   . Anxiety   . Lymphadenopathy, abdominal   . Urethral stricture   . Anemia   . BPPV (benign paroxysmal positional vertigo)   . Lymphoma   . Diabetes mellitus without complication   . Obstructive sleep apnea     uses cpap at home  . Sleep apnea     No family history on file.  History   Social History  . Marital Status: Married    Spouse Name: N/A    Number of Children: N/A  . Years of Education: N/A   Occupational History  . Not on file.   Social History Main Topics  . Smoking status: Former Smoker -- 30 years    Quit date: 04/12/1968  . Smokeless tobacco: Never Used  . Alcohol Use: No  . Drug Use: No  . Sexual Activity: No   Other Topics Concern  . Not on file   Social History Narrative    Past Surgical History  Procedure Laterality Date  . Back surgery    . Prostate surgery    . Tonsillectomy       . allopurinol  300 mg Oral Daily  . antiseptic oral rinse  7 mL Mouth Rinse q12n4p  . aspirin  325 mg Oral Daily  . atorvastatin  10 mg Oral q1800  . chlorhexidine  15  mL Mouth Rinse BID  . diltiazem  120 mg Oral Daily  . docusate sodium  100 mg Oral BID  . enoxaparin (LOVENOX) injection  40 mg Subcutaneous Q24H  . feeding supplement (ENSURE COMPLETE)  237 mL Oral BID BM  . fluconazole  100 mg Oral Daily  . Influenza vac split quadrivalent PF  0.5 mL Intramuscular Tomorrow-1000  . insulin aspart  0-15 Units Subcutaneous TID WC  . loratadine  10 mg Oral BH-q7a  . metoprolol  25 mg Oral BID  . miconazole  1 application Topical BID  . nystatin  5 mL Oral QID  . ondansetron  8 mg Oral Q12H  . polyethylene glycol  17 g Oral Daily  . predniSONE  10 mg Oral Q breakfast  . sodium chloride  3 mL Intravenous Q12H   . sucralfate  1 g Oral TID WC & HS   . sodium chloride 75 mL/hr at 05/05/14 2338  . diltiazem (CARDIZEM) infusion 5 mg/hr (05/06/14 0300)    Physical Exam: Blood pressure 136/71, pulse 80, temperature 98 F (36.7 C), temperature source Oral, resp. rate 20, height 5\' 9"  (1.753 m), weight 68.493 kg (151 lb), SpO2 95 %.   Affect appropriate Chronically ill pale frail male  HEENT: voice weak and hoarse  Neck supple with no adenopathy JVP normal no bruits no thyromegaly Lungs clear with no wheezing and good diaphragmatic motion Heart:  S1/S2 SEM  murmur, no rub, gallop or click PMI normal Abdomen: benighn, BS positve, no tenderness, no AAA no bruit.  No HSM or HJR Distal pulses intact with no bruits No edema Neuro non-focal Skin warm and dry No muscular weakness   Labs:   Lab Results  Component Value Date   WBC 16.0* 05/05/2014   HGB 12.1* 05/05/2014   HCT 36.8* 05/05/2014   MCV 85.0 05/05/2014   PLT 280 05/05/2014    Recent Labs Lab 05/02/14 1316  05/05/14 0530  NA 135  < > 135  K 4.8  < > 4.2  CL 94*  < > 96  CO2 30  < > 31  BUN 24*  < > 20  CREATININE 0.80  < > 0.90  CALCIUM 9.1  < > 9.0  PROT 6.2  --   --   BILITOT 0.5  --   --   ALKPHOS 72  --   --   ALT 14  --   --   AST 25  --   --   GLUCOSE 188*  < > 141*  < > = values in this interval not displayed. Lab Results  Component Value Date   TROPONINI 0.11* 04/13/2014    Lab Results  Component Value Date   CHOL 111 05/03/2014   Lab Results  Component Value Date   HDL 38* 05/03/2014   Lab Results  Component Value Date   LDLCALC 51 05/03/2014   Lab Results  Component Value Date   TRIG 111 05/03/2014   Lab Results  Component Value Date   CHOLHDL 2.9 05/03/2014   No results found for: LDLDIRECT    Radiology: Dg Shoulder Right  05/02/2014   CLINICAL DATA:  Pain all over  EXAM: RIGHT SHOULDER - 2+ VIEW  COMPARISON:  None.  FINDINGS: There is no fracture or dislocation. There are mild  degenerative changes of the acromioclavicular joint.  IMPRESSION: No acute osseous injury of the right shoulder.   Electronically Signed   By: Kathreen Devoid   On: 05/02/2014 20:18  Dg Tibia/fibula Left  04/18/2014   CLINICAL DATA:  Left leg pain and weakness follow multiple falls at home recently.  EXAM: LEFT TIBIA AND FIBULA - 2 VIEW  COMPARISON:  None.  FINDINGS: Anterior tibial tubercle spur. Mild patellar spur formation. No fracture or dislocation. Medium and lateral meniscus calcification.  IMPRESSION: 1. No fracture or dislocation. 2. Chondrocalcinosis and mild degenerative changes.   Electronically Signed   By: Enrique Sack M.D.   On: 04/18/2014 15:16   Ct Head Wo Contrast  05/05/2014   CLINICAL DATA:  Dysarthria, worsening RIGHT-sided weakness, RIGHT arm pain. Hypertension.  EXAM: CT HEAD WITHOUT CONTRAST  TECHNIQUE: Contiguous axial images were obtained from the base of the skull through the vertex without intravenous contrast.  COMPARISON:  MRI of the head May 02, 2014  FINDINGS: The ventricles and sulci are normal for age. No intraparenchymal hemorrhage, mass effect nor midline shift. Patchy supratentorial white matter hypodensities are within normal range for patient's age and though non-specific suggest sequelae of chronic small vessel ischemic disease. No acute large vascular territory infarcts.  No abnormal extra-axial fluid collections. Basal cisterns are patent. Moderate calcific atherosclerosis of the carotid siphons.  No skull fracture. The included ocular globes and orbital contents are non-suspicious. The mastoid aircells and included paranasal sinuses are well-aerated.  IMPRESSION: No acute intracranial process.  Involutional changes. Similar mild to moderate white matter changes can be seen with chronic small vessel ischemic disease.   Electronically Signed   By: [name]   On: 05/05/2014 00:41   Ct Head Wo Contrast  05/02/2014   CLINICAL DATA:  Right upper extremity  weakness. Recent diagnosis of lymphoma.  EXAM: CT HEAD WITHOUT CONTRAST  TECHNIQUE: Contiguous axial images were obtained from the base of the skull through the vertex without intravenous contrast.  COMPARISON:  CT scan dated 06/29/2007  FINDINGS: No mass lesion. No midline shift. No acute hemorrhage or hematoma. No extra-axial fluid collections. No evidence of acute infarction. There is a small old white matter infarct high in the right frontal lobe. There is diffuse slight cerebral cortical atrophy, stable. No ventricular dilatation.  Osseous structures are normal.  IMPRESSION: No acute abnormality. 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   Electronically Signed   By: Rozetta Nunnery M.D.   On: 05/02/2014 14:08   Mr Jodene Nam Head Wo Contrast  05/02/2014   CLINICAL DATA:  Leg weakness. Difficulty walking. History of lymphoma and diabetes and atrial fibrillation.  EXAM: MRI HEAD WITHOUT AND WITH CONTRAST  MRA HEAD WITHOUT CONTRAST  TECHNIQUE: Multiplanar, multiecho pulse sequences of the brain and surrounding structures were obtained without and with intravenous contrast. Angiographic images of the head were obtained using MRA technique without contrast.  CONTRAST:  67mL MULTIHANCE GADOBENATE DIMEGLUMINE 529 MG/ML IV SOLN  COMPARISON:  CT head 05/02/2014  FINDINGS: MRI HEAD FINDINGS  Negative for acute infarct.  Moderate atrophy. Mild chronic microvascular ischemic changes in the cerebral white matter. Basal ganglia and brainstem and cerebellum normal.  Negative for mass or edema.  Normal enhancement following contrast administration. No enhancing mass lesion identified.  MRA HEAD FINDINGS  Both vertebral arteries are patent to the basilar without significant stenosis. Left vertebral artery dominant. PICA, superior cerebellar, posterior cerebral artery is patent bilaterally without stenosis. Basilar widely patent.  Internal carotid artery widely patent bilaterally. Anterior and middle cerebral arteries  widely patent without stenosis  Negative for cerebral aneurysm.  IMPRESSION: Atrophy and mild chronic microvascular ischemia.  No acute infarct  Negative MRA head.   Electronically Signed   By: Franchot Gallo M.D.   On: 05/02/2014 20:14   Mr Jeri Cos ZO Contrast  05/02/2014   CLINICAL DATA:  Leg weakness. Difficulty walking. History of lymphoma and diabetes and atrial fibrillation.  EXAM: MRI HEAD WITHOUT AND WITH CONTRAST  MRA HEAD WITHOUT CONTRAST  TECHNIQUE: Multiplanar, multiecho pulse sequences of the brain and surrounding structures were obtained without and with intravenous contrast. Angiographic images of the head were obtained using MRA technique without contrast.  CONTRAST:  68mL MULTIHANCE GADOBENATE DIMEGLUMINE 529 MG/ML IV SOLN  COMPARISON:  CT head 05/02/2014  FINDINGS: MRI HEAD FINDINGS  Negative for acute infarct.  Moderate atrophy. Mild chronic microvascular ischemic changes in the cerebral white matter. Basal ganglia and brainstem and cerebellum normal.  Negative for mass or edema.  Normal enhancement following contrast administration. No enhancing mass lesion identified.  MRA HEAD FINDINGS  Both vertebral arteries are patent to the basilar without significant stenosis. Left vertebral artery dominant. PICA, superior cerebellar, posterior cerebral artery is patent bilaterally without stenosis. Basilar widely patent.  Internal carotid artery widely patent bilaterally. Anterior and middle cerebral arteries widely patent without stenosis  Negative for cerebral aneurysm.  IMPRESSION: Atrophy and mild chronic microvascular ischemia.  No acute infarct  Negative MRA head.   Electronically Signed   By: Franchot Gallo M.D.   On: 05/02/2014 20:14   Mr Cervical Spine W Wo Contrast  05/04/2014   CLINICAL DATA:  Right hemiparesis. Diffuse large B-cell lymphoma, beginning chemotherapy 12/2013.  EXAM: MRI CERVICAL SPINE WITHOUT AND WITH CONTRAST  TECHNIQUE: Multiplanar and multiecho pulse sequences of the  cervical spine, to include the craniocervical junction and cervicothoracic junction, were obtained according to standard protocol without and with intravenous contrast.  CONTRAST:  72mL MULTIHANCE GADOBENATE DIMEGLUMINE 529 MG/ML IV SOLN  COMPARISON:  PET-CT 03/09/2014.  Cervical spine CT 06/29/2007.  FINDINGS: Images are mildly to moderately degraded by motion artifact.  Vertebral alignment is unchanged from prior cervical spine CT, with straightening of the normal cervical lordosis, trace anterolisthesis of C4 on C5, and trace retrolisthesis of C5 on C6. Severe disc space narrowing is again seen at C5-6. Vertebral bone marrow signal is within normal limits aside from mild degenerative marrow changes in the mid to lower cervical spine. No enhancing lesions are identified. Craniocervical junction is unremarkable. Cervical spinal cord is normal in caliber without definite signal abnormality identified.  C2-3:  Small central disc protrusion without stenosis.  C3-4:  Negative.  C4-5:  Negative.  C5-6: Listhesis and endplate osteophytosis result in mild spinal stenosis and mild bilateral neural foraminal stenosis.  C6-7: Minimal disc bulging and uncovertebral spurring without significant stenosis.  C7-T1: Right greater than left facet arthrosis results in minimal right neural foraminal narrowing. No spinal stenosis.  IMPRESSION: Multilevel cervical spondylosis, worst at C5-6 where there is mild spinal stenosis.   Electronically Signed   By: Logan Bores   On: 05/04/2014 14:00   Dg Op Swallowing Func-medicare/speech Path  05/02/2014   Procedures by Earley Brooke, CCC-SLP at 05/02/2014 1:24 PM    Author: Earley Brooke, CCC-SLP Service: (none) Author Type: Speech  and Language Pathologist   Filed: 05/02/2014 1:25 PM Note Time: 05/02/2014 1:24 PM Status: Signed   Editor: Earle Gell McCoy, CCC-SLP (Speech and Language Pathologist)      Procedures   1. SLP MODIFIED BARIUM SWALLOW [[id number] (Custom)]        Expand All Collapse  All    Objective Swallowing Evaluation: Modified Barium Swallowing Study  Patient Details  Name: Erik Ramirez MRN: [id number] Date of Birth: 06/12/1925  Today's Date: 05/02/2014 Time: SLP Start Time (ACUTE ONLY): 1135-SLP Stop Time (ACUTE ONLY): 1240 SLP Time Calculation (min) (ACUTE ONLY): 65 min  Past Medical History:  Past Medical History  Diagnosis Date  . Hypertension   . Hyperlipemia   . Anxiety   . Lymphadenopathy, abdominal   . Urethral stricture   . Anemia   . BPPV (benign paroxysmal positional vertigo)   . Lymphoma   . Diabetes mellitus without complication   . Obstructive sleep apnea     uses cpap at home  . Sleep apnea    Past Surgical History:  Past Surgical History  Procedure Laterality Date  . Back surgery    . Prostate surgery    . Tonsillectomy     HPI:  HPI: 81 year old male with h/o HTN, anxiety, lymphoma, sleep apnea, DM,  anemia seen for OP MBS due to c/o dysphagia x 1 week. Patient and son  providing history which includes also within the week, new onset right arm  weakness and numbness, right leg numbness, significant generalized  weakness, severe vocal quality changes, falls. SLP confirms severe  dysphonia (almost aphonic) as well as right sided facial droop.   No Data Recorded  Assessment / Plan / Recommendation CHL IP CLINICAL IMPRESSIONS 05/02/2014  Dysphagia Diagnosis Moderate pharyngeal phase dysphagia;Severe pharyngeal  phase dysphagia  Clinical impression Patient presents with a moderate-severe primarily  pharyngeal dysphagia characterized by base of tongue, laryngeal, and  pharyngeal weakness resulting in decreased pharyngeal clearance of bolus  and decreased airway protection. SEvere pharyngeal residuals remained post  swallow which patient senses, responding with multiple swallows which are  moderately effective to clear. Penetrates noted with nectar thick liquids  clear with either multiple swallows  or clinician cueing for throat clear.  Various head postures assessed (chin tuck, right head turn) and  unsuccessful to reduce pharyngeal residuals or facilitate improved airway  protection. Overall, risk of aspiration high at this time given severity  of deficits, reducing to moderate with strict use of aspiration  precautions which were reviewed in full with patient and son. Given  severity of neuro symptoms suggestive of CVA, recommended rapid MD f/u.  Attempted to contact referring physician, Dr. Jana Hakim, via phone without  response. Recommended patient go to ED given stroke symptoms. Patient and  family in agreement.       CHL IP TREATMENT RECOMMENDATION 05/02/2014  Treatment Plan Recommendations Defer treatment plan to SLP at (Comment)     CHL IP DIET RECOMMENDATION 05/02/2014  Diet Recommendations Dysphagia 2 (Fine chop);Nectar-thick liquid  Liquid Administration via Cup;No straw  Medication Administration Crushed with puree  Compensations Slow rate;Small sips/bites;Multiple dry swallows after each  bite/sip;Clear throat after each swallow  Postural Changes and/or Swallow Maneuvers Seated upright 90 degrees     CHL IP OTHER RECOMMENDATIONS 05/02/2014  Recommended Consults (None)  Oral Care Recommendations Oral care BID  Other Recommendations Prohibited food (jello, ice cream, thin soups)     CHL IP FOLLOW UP RECOMMENDATIONS 05/02/2014  Follow up Recommendations Home health SLP     No flowsheet data found.       SLP Swallow Goals No flowsheet data found.  No flowsheet data found.    CHL IP REASON FOR REFERRAL 05/02/2014  Reason for Referral Objectively evaluate swallowing function     CHL IP ORAL PHASE 05/02/2014  Lips (None)  Tongue (None)  Mucous membranes (None)  Nutritional status (None)  Other (None)  Oxygen therapy (None)  Oral Phase WFL  Oral - Pudding Teaspoon (None)  Oral - Pudding Cup (None)  Oral - Honey Teaspoon (None)  Oral -  Honey Cup (None)  Oral - Honey Syringe (None)  Oral - Nectar Teaspoon (None)  Oral - Nectar Cup (None)  Oral - Nectar Straw (None)  Oral - Nectar Syringe (None)  Oral - Ice Chips (None)  Oral - Thin Teaspoon (None)  Oral - Thin Cup (None)  Oral - Thin Straw (None)  Oral - Thin Syringe (None)  Oral - Puree (None)  Oral - Mechanical Soft (None)  Oral - Regular (None)  Oral - Multi-consistency (None)  Oral - Pill (None)  Oral Phase - Comment (None)      CHL IP PHARYNGEAL PHASE 05/02/2014  Pharyngeal Phase Impaired  Pharyngeal - Pudding Teaspoon (None)  Penetration/Aspiration details (pudding teaspoon) (None)  Pharyngeal - Pudding Cup (None)  Penetration/Aspiration details (pudding cup) (None)  Pharyngeal - Honey Teaspoon (None)  Penetration/Aspiration details (honey teaspoon) (None)  Pharyngeal - Honey Cup (None)  Penetration/Aspiration details (honey cup) (None)  Pharyngeal - Honey Syringe (None)  Penetration/Aspiration details (honey syringe) (None)  Pharyngeal - Nectar Teaspoon Delayed swallow initiation;Premature  spillage to pyriform sinuses;Reduced pharyngeal peristalsis;Reduced  epiglottic inversion;Reduced anterior laryngeal mobility;Reduced laryngeal  elevation;Reduced airway/laryngeal closure;Reduced tongue base  retraction;Penetration/Aspiration before swallow;Penetration/Aspiration  during swallow;Pharyngeal residue - valleculae;Pharyngeal residue -  pyriform sinuses;Lateral channel residue;Compensatory strategies attempted  (Comment)  Penetration/Aspiration details (nectar teaspoon) Material enters airway,  CONTACTS cords and not ejected out  Pharyngeal - Nectar Cup Delayed swallow initiation;Premature spillage to  pyriform sinuses;Reduced pharyngeal peristalsis;Reduced epiglottic  inversion;Reduced anterior laryngeal mobility;Reduced laryngeal  elevation;Reduced airway/laryngeal closure;Reduced tongue base  retraction;Penetration/Aspiration  before swallow;Penetration/Aspiration  during swallow;Pharyngeal residue - valleculae;Pharyngeal residue -  pyriform sinuses;Lateral channel residue;Compensatory strategies attempted  (Comment)  Penetration/Aspiration details (nectar cup) Material enters airway,  CONTACTS cords and not ejected out  Pharyngeal - Nectar Straw (None)  Penetration/Aspiration details (nectar straw) (None)  Pharyngeal - Nectar Syringe (None)  Penetration/Aspiration details (nectar syringe) (None)  Pharyngeal - Ice Chips (None)  Penetration/Aspiration details (ice chips) (None)  Pharyngeal - Thin Teaspoon (None)  Penetration/Aspiration details (thin teaspoon) (None)  Pharyngeal - Thin Cup Delayed swallow initiation;Premature spillage to  pyriform sinuses;Reduced pharyngeal peristalsis;Reduced epiglottic  inversion;Reduced anterior laryngeal mobility;Reduced laryngeal  elevation;Reduced airway/laryngeal closure;Reduced tongue base  retraction;Penetration/Aspiration before swallow;Penetration/Aspiration  during swallow;Pharyngeal residue - valleculae;Pharyngeal residue -  pyriform sinuses;Lateral channel residue;Compensatory strategies attempted  (Comment);Moderate aspiration  Penetration/Aspiration details (thin cup) Material enters airway, passes  BELOW cords and not ejected out despite cough attempt by patient  Pharyngeal - Thin Straw (None)  Penetration/Aspiration details (thin straw) (None)  Pharyngeal - Thin Syringe (None)  Penetration/Aspiration details (thin syringe') (None)  Pharyngeal - Puree Delayed swallow initiation;Premature spillage to  pyriform sinuses;Reduced pharyngeal peristalsis;Reduced epiglottic  inversion;Reduced anterior laryngeal mobility;Reduced laryngeal  elevation;Reduced airway/laryngeal closure;Reduced tongue base  retraction;Pharyngeal residue - valleculae;Pharyngeal residue - pyriform  sinuses;Lateral channel residue;Compensatory strategies attempted  (Comment)   Penetration/Aspiration details (puree) Material does not enter airway  Pharyngeal - Mechanical Soft Delayed swallow initiation;Premature  spillage to pyriform sinuses;Reduced pharyngeal peristalsis;Reduced  epiglottic inversion;Reduced anterior laryngeal mobility;Reduced laryngeal  elevation;Reduced airway/laryngeal closure;Reduced tongue base  retraction;Pharyngeal residue - valleculae;Pharyngeal residue - pyriform  sinuses;Lateral channel residue;Compensatory strategies attempted  (Comment)  Penetration/Aspiration details (mechanical soft) Material does not enter  airway  Pharyngeal - Regular (None)  Penetration/Aspiration details (regular) (None)  Pharyngeal - Multi-consistency (None)  Penetration/Aspiration details (multi-consistency) (None)  Pharyngeal - Pill (None)  Penetration/Aspiration details (pill) (None)  Pharyngeal Comment (None)     CHL IP CERVICAL ESOPHAGEAL PHASE 05/02/2014  Cervical Esophageal Phase Impaired  Pudding Teaspoon (None)  Pudding Cup (None)  Honey Teaspoon (None)  Honey Cup (None)  Honey Syringe (None)  Nectar Teaspoon (None)  Nectar Cup (None)  Nectar Straw (None)  Nectar Syringe (None)  Thin Teaspoon (None)  Thin Cup (None)  Thin Straw (None)  Thin Syringe (None)  Cervical Esophageal Comment decreased UES relaxation    CHL IP GO 05/02/2014  Functional Assessment Tool Used skilled clinical judgement  Functional Limitations Swallowing  Swallow Current Status (O1607) CL  Swallow Goal Status (P7106) CL  Swallow Discharge Status (Y6948) CL  Motor Speech Current Status (N4627) (None)  Motor Speech Goal Status (O3500) (None)  Motor Speech Goal Status (X3818) (None)  Spoken Language Comprehension Current Status (E9937) (None)  Spoken Language Comprehension Goal Status (J6967) (None)  Spoken Language Comprehension Discharge Status (E9381) (None)  Spoken Language Expression Current Status (O1751) (None)  Spoken  Language Expression Goal Status (W2585) (None)  Spoken Language Expression Discharge Status (I7782) (None)  Attention Current Status (U2353) (None)  Attention Goal Status (I1443) (None)  Attention Discharge Status (X5400) (None)  Memory Current Status (Q6761) (None)  Memory Goal Status (P5093) (None)  Memory Discharge Status (O6712) (None)  Voice Current Status (W5809) (None)  Voice Goal Status (X8338) (None)  Voice Discharge Status (S5053) (None)  Other Speech-Language Pathology Functional Limitation [phone] (None)  Other Speech-Language Pathology Functional Limitation Goal Status (A1937)  (None)  Other Speech-Language Pathology Functional Limitation Discharge Status  [phone]) (None)   Gabriel Rainwater MA, CCC-SLP [phone]         McCoy Leah Meryl 05/02/2014, 1:24 PM            05/02/2014   CLINICAL DATA: swallowing difficulties   FLUOROSCOPY FOR SWALLOWING FUNCTION STUDY:  Fluoroscopy was provided for swallowing function study, which was  administered by a speech pathologist.  Final results and recommendations  from this study are contained within the speech pathology report.    Dg Chest Port 1 View  05/04/2014   CLINICAL DATA:  Aspiration.  Cough, shortness of Breath  EXAM: PORTABLE CHEST - 1 VIEW  COMPARISON:  04/12/2014  FINDINGS: Right Port-A-Cath is in place. The tip is in the SVC. Heart and mediastinal contours are within normal limits. No focal opacities or effusions. No acute bony abnormality.  IMPRESSION: No active disease.   Electronically Signed   By: Rolm Baptise M.D.   On: 05/04/2014 12:18   Dg Chest Port 1 View  04/12/2014   CLINICAL DATA:  Fever, sore throat.  Cough.  History of lymphoma.  EXAM: PORTABLE CHEST - 1 VIEW  COMPARISON:  PET-CT 03/09/2014.  Radiograph 01/17/2014  FINDINGS: Right chest port remains in place, tip in the mid SVC. Diminished left pleural effusion with minimal residual blunting of left costophrenic angle. No airspace consolidation to  suggest pneumonia. The cardiomediastinal contours are unchanged. There is no pneumothorax.  IMPRESSION: Diminished left pleural effusion with minimal residual blunting of the costophrenic angle. No acute pulmonary process.   Electronically Signed   By: Jeb Levering M.D.   On: 04/12/2014 19:15   Dg Hips Bilat With Pelvis 2v  05/02/2014   CLINICAL DATA:  Fall.  Pain.  EXAM: DG HIP W/ PELVIS 2V BILAT  COMPARISON:  None.  FINDINGS: No  acute fracture. No dislocation. Mild osteopenia. Minimal degenerative change of the right hip joint.  IMPRESSION: No acute bony pathology.   Electronically Signed   By: Maryclare Bean M.D.   On: 05/02/2014 20:14   Dg Hip Unilat With Pelvis 2-3 Views Left  04/18/2014   CLINICAL DATA:  Several falls outcome recently. LEFT leg weakness. LEFT hip pain radiating down the LEFT leg. Initial encounter.  EXAM: RADIOLOGY EXAMINATION  COMPARISON:  None.  FINDINGS: AP view of the pelvis and AP and lateral views of the LEFT hip are submitted for interpretation. LEFT hip is located. Negative for fracture. Pelvic rings appear intact. Sacral arcades appear normal.  IMPRESSION: No acute osseous abnormality.   Electronically Signed   By: Dereck Ligas M.D.   On: 04/18/2014 15:23   Dg Femur Min 2 Views Left  04/18/2014   CLINICAL DATA:  Several falls at home recently.  LEFT leg weakness.  EXAM: RADIOLOGY EXAMINATION  COMPARISON:  None.  FINDINGS: Atherosclerosis is present. There is no fracture or acute osseous abnormality. AP and lateral views of the femur submitted for interpretation. The visualized portions of the knee demonstrate chondrocalcinosis. No aggressive osseous lesions.  IMPRESSION: No acute injury to the LEFT femur. Chondrocalcinosis of the knee, probably senile degenerative in this age group.   Electronically Signed   By: Dereck Ligas M.D.   On: 04/18/2014 15:14    EKG:  NSR rate 88 normal 05/06/14 QT 360    ASSESSMENT AND PLAN:  PAF:  History of will increase lopressor and  continue cardizem.  Not an anticoagulation candidate due to chemo, CA and low blood counts as well as age.  EF normal by recent echo and no CAD I think it is reasonable to try pill in pocket flecainide x 1 dose 300 mg to convert in first 48 hours   Diffuse large B-cell lymphoma:  Counts have improved not clear how this relates to his swallowing trouble  Barrium swallow this am followed by Dr Jana Hakim  Elevated Tropoin:  No evidence active CAD/chest pain.  Echo normal EF  ECG no ST changes    Weakness:  Right sided  CT/MRI no acute changes but at risk with PAF and no anticoagulation.  Would not feel comfortable starting him on anticoagulation despite risk of stroke with PAF, age and HTN DM  This patients CHA2DS2-VASc Score and unadjusted Ischemic Stroke Rate (% per year) is equal to 7.2 % stroke rate/year from a score of 5  Above score calculated as 1 point each if present [CHF, HTN, DM, Vascular=MI/PAD/Aortic Plaque, Age if 65-74, or Male] Above score calculated as 2 points each if present [Age > 75, or Stroke/TIA/TE]    Signed: Jenkins Rouge 05/06/2014, 10:20 AM

## 2014-05-06 NOTE — Progress Notes (Signed)
Had a family meeting about goals of care, HPOA son [name] , confirms DNR code status ( code changed to DNR), and request Tube feed trial for few days.,will consult palliative care in am. Phillips Climes MD

## 2014-05-06 NOTE — Progress Notes (Signed)
PROGRESS NOTE  Erik Ramirez SWN:[id number] DOB: 1925/04/24 DOA: 05/02/2014 PCP: Pcp Not In System  HPI: 81 year old male with history of diffuse large B-cell lymphoma on chemotherapy, diabetes mellitus, paroxysmal A. fib, hypertension, obstructive sleep apnea was recently hospitalized for neutropenic fever and anemia, elevated troponin due to demand ischemia, and was discharged home with home health. Patient reports that he has been feeling weak since his discharge and not having enough strength in his legs. He fell in his bathroom about 10 days back and says that his legs gave away. He thinks he may have hit his right scapula, hip and right knee and leg. Since then he has been progressively feeling weak and having pain in his right shoulder, hips and the right leg. For past 2-3 days he reports that his right leg feels like "lead ". He denies any syncope, headache or dizziness. He denies any tingling or numbness of his extremities. Denies any bowel or urinary symptoms. Denies any blurred vision. He reports poor appetite and difficulty swallowing due to oral thrush. Patient denies fever, chills, nausea , vomiting, chest pain, palpitations, SOB, abdominal pain, bowel or urinary symptoms. As reported that his right hand has been week for past 1 week. Pt had presented for outpatient swallow eval today and was referred to ED given complains of right-sided weakness.  Hospital course  Vision admitted for further workup, MRI brain, MRI cervical spine, did not show any acute findings, no evidence of acute CVA, or metastatic disease, uroflow patient's symptoms most likely to generalized deconditioning, patient did fail swallow evaluation initially, but on repeat evaluation he was advanced to dysphagia 2 diet, unfortunately to be downgraded again to nothing by mouth after he failed another modified barium swallow on 1/22. - Patient has known history of A. fib, went into A. fib with RVR on 1/22, where he was  started on Cardizem drip.  Subjective / 24 H Interval events Pain better this morning, patient developed A. fib with RVR overnight where he was transferred to telemetry unit on Cardizem drip.  Assessment/Plan: Principal Problem:   CVA (cerebral vascular accident) Active Problems:   Diabetes mellitus without complication   DLBCL (diffuse large B cell lymphoma)   CKD (chronic kidney disease), stage III   Mucositis due to antineoplastic therapy   Anemia in neoplastic disease   Protein-calorie malnutrition, severe   Fall at home   Weakness generalized   Right hemiparesis   PAF (paroxysmal atrial fibrillation)  Rt sided weakness - Neurology consulted, appreciate input. Symptoms was felt more likely secondary to generalized weakness in the absence of significant finding on the imaging, as no metastatic disease or acute CVA was on imaging - MRI brain is negative, - MRI C spine showing Multilevel cervical spondylosis, worst at C5-6 where there is mild spinal stenosis, discussed with neurosurgery on call Dr. Ronnald Ramp,  these findings wouldn't cause the patient symptoms. - subjectively better - Discussed with oncology on call, CNS involvement is unlikely with B-cell lymphoma, no evidence of metastatic is on imaging. - Carotid Doppler no significant stenosis - 2-D echo showing EF of 55% with grade 1 diastolic dysfunction. - PT/OT eval., improving, for CIR placement.  A. fib with RVR - Patient on metoprolol and Cardizem - Currently on Cardizem drip - Cardiology consult appreciated, will try flecainide 1 dose - Not and the coagulation candidate given his lymphoma, age, severe deconditioning, and chemotherapy, and history of thrombocytopenia.  Dysphagia - SLP evaluation appreciated, patient failed another in MBS evaluation today  -  Again next nothing by mouth except few small on of nectar thick. - Will request gastroenterology input.  Right shoulder and hip pain - Secondary to fall. No  fractures on XR  Leukocytosis - Most likely related to steroids, around baseline, x-ray showing no opacity or infiltrate, urinalysis is  Negative 1/18  DLBCL (diffuse large B cell lymphoma)  - Following with Dr. Jana Hakim as outpatient and on chemotherapy.  Diabetes mellitus type 2  continue SSI  CKD (chronic kidney disease), stage III  Renal function stable.   Mucositis due to antineoplastic therapy  As diffuse oral thrush. Continue fluconazole and nystatin.  Anemia in neoplastic disease  H&H stable  Protein-calorie malnutrition, severe  conitnue supplements  HTN  continue home medications, and hydralazine PRN  Diet: DIET - DYS 1 Fluids: none DVT Prophylaxis: Lovenox  Code Status: Full Code Family Communication: none  adisposition plan: For CIR placement when stable.   Consults  Neurology   Cardiology  Procedures:  None    Antibiotics  Anti-infectives    Start     Dose/Rate Route Frequency Ordered Stop   05/02/14 1830  fluconazole (DIFLUCAN) tablet 100 mg     100 mg Oral Daily 05/02/14 1821         Studies  Ct Head Wo Contrast  05/05/2014   CLINICAL DATA:  Dysarthria, worsening RIGHT-sided weakness, RIGHT arm pain. Hypertension.  EXAM: CT HEAD WITHOUT CONTRAST  TECHNIQUE: Contiguous axial images were obtained from the base of the skull through the vertex without intravenous contrast.  COMPARISON:  MRI of the head May 02, 2014  FINDINGS: The ventricles and sulci are normal for age. No intraparenchymal hemorrhage, mass effect nor midline shift. Patchy supratentorial white matter hypodensities are within normal range for patient's age and though non-specific suggest sequelae of chronic small vessel ischemic disease. No acute large vascular territory infarcts.  No abnormal extra-axial fluid collections. Basal cisterns are patent. Moderate calcific atherosclerosis of the carotid siphons.  No skull fracture. The included ocular globes and orbital contents  are non-suspicious. The mastoid aircells and included paranasal sinuses are well-aerated.  IMPRESSION: No acute intracranial process.  Involutional changes. Similar mild to moderate white matter changes can be seen with chronic small vessel ischemic disease.   Electronically Signed   By: [name]   On: 05/05/2014 00:41   Mr Cervical Spine W Wo Contrast  05/04/2014   CLINICAL DATA:  Right hemiparesis. Diffuse large B-cell lymphoma, beginning chemotherapy 12/2013.  EXAM: MRI CERVICAL SPINE WITHOUT AND WITH CONTRAST  TECHNIQUE: Multiplanar and multiecho pulse sequences of the cervical spine, to include the craniocervical junction and cervicothoracic junction, were obtained according to standard protocol without and with intravenous contrast.  CONTRAST:  30mL MULTIHANCE GADOBENATE DIMEGLUMINE 529 MG/ML IV SOLN  COMPARISON:  PET-CT 03/09/2014.  Cervical spine CT 06/29/2007.  FINDINGS: Images are mildly to moderately degraded by motion artifact.  Vertebral alignment is unchanged from prior cervical spine CT, with straightening of the normal cervical lordosis, trace anterolisthesis of C4 on C5, and trace retrolisthesis of C5 on C6. Severe disc space narrowing is again seen at C5-6. Vertebral bone marrow signal is within normal limits aside from mild degenerative marrow changes in the mid to lower cervical spine. No enhancing lesions are identified. Craniocervical junction is unremarkable. Cervical spinal cord is normal in caliber without definite signal abnormality identified.  C2-3:  Small central disc protrusion without stenosis.  C3-4:  Negative.  C4-5:  Negative.  C5-6: Listhesis and endplate osteophytosis result in  mild spinal stenosis and mild bilateral neural foraminal stenosis.  C6-7: Minimal disc bulging and uncovertebral spurring without significant stenosis.  C7-T1: Right greater than left facet arthrosis results in minimal right neural foraminal narrowing. No spinal stenosis.  IMPRESSION: Multilevel  cervical spondylosis, worst at C5-6 where there is mild spinal stenosis.   Electronically Signed   By: Logan Bores   On: 05/04/2014 14:00   Dg Chest Port 1 View  05/04/2014   CLINICAL DATA:  Aspiration.  Cough, shortness of Breath  EXAM: PORTABLE CHEST - 1 VIEW  COMPARISON:  04/12/2014  FINDINGS: Right Port-A-Cath is in place. The tip is in the SVC. Heart and mediastinal contours are within normal limits. No focal opacities or effusions. No acute bony abnormality.  IMPRESSION: No active disease.   Electronically Signed   By: Rolm Baptise M.D.   On: 05/04/2014 12:18   Dg Swallowing Func-speech Pathology  05/06/2014   Lorre Nick, CCC-SLP     05/06/2014 11:27 AM  Objective Swallowing Evaluation: Modified Barium Swallowing Study   Patient Details  Name: Zakye Baby MRN: [id number] Date of Birth: 10/26/1925  Today's Date: 05/06/2014 Time: SLP Start Time (ACUTE ONLY): 1045-SLP Stop Time (ACUTE  ONLY): 1105 SLP Time Calculation (min) (ACUTE ONLY): 20 min  Past Medical History:  Past Medical History  Diagnosis Date  . Hypertension   . Hyperlipemia   . Anxiety   . Lymphadenopathy, abdominal   . Urethral stricture   . Anemia   . BPPV (benign paroxysmal positional vertigo)   . Lymphoma   . Diabetes mellitus without complication   . Obstructive sleep apnea     uses cpap at home  . Sleep apnea    Past Surgical History:  Past Surgical History  Procedure Laterality Date  . Back surgery    . Prostate surgery    . Tonsillectomy     HPI:  HPI: 81 year old male with h/o HTN, anxiety, lymphoma, sleep  apnea, DM, anemia seen for OP MBS due to c/o dysphagia x 1 week.  Patient and son providing history which includes also within the  week, new onset right arm weakness and numbness, right leg  numbness, significant generalized weakness, severe vocal quality  changes, falls. SLP confirms severe dysphonia (almost aphonic) as  well as right sided facial droop.   No Data Recorded  Assessment / Plan / Recommendation CHL IP CLINICAL  IMPRESSIONS 05/06/2014  Dysphagia Diagnosis Moderate pharyngeal phase dysphagia;Severe  pharyngeal phase dysphagia  Clinical impression Patient continues to present with a  moderate-severe primarily pharyngeal dysphagia characterized by  base of tongue, laryngeal, and pharyngeal weakness, as well as  reduced UES relaxation. This results in poor pharyngeal clearance  of all tested consistencies, as well as decreased airway  protection of thin liquids (via tsp) and nectar thick liquids  (via cup sip). No penetration was noted on puree consistency.  Significant post-swallow residual noted pharyngeally across  consistencies, which patient continues to sense, responding with  multiple dry swallows or bringing residual back to oral cavity to  be suctioned. Various head postures assessed and were again  unsuccessful to reduce pharyngeal residuals. Overall, risk of  aspiration continues to be high, given severity of deficits, and  risk of poor nutrition is also an issue, given the minimal amount  of material which successfully passes into the esophagus.   Recommend further esophageal work up to assess appropriateness  for management of UES issue.  Pt appears safe for small  individual  boluses of nectar thick liquid via teaspoon to  mitigate disuse atrophy, however, primary nutrition, hydration,  and medication is recommended to be NPO at this time.      CHL IP TREATMENT RECOMMENDATION 05/06/2014  Treatment Plan Recommendations Therapy as outlined in treatment  plan below     CHL IP DIET RECOMMENDATION 05/06/2014  Diet Recommendations NPO  Liquid Administration via Spoon  Medication Administration Via alternative means  Compensations (None)  Postural Changes and/or Swallow Maneuvers (None)     CHL IP OTHER RECOMMENDATIONS 05/06/2014  Recommended Consults Consider GI evaluation;Consider ENT  evaluation  Oral Care Recommendations Oral care Q4 per protocol  Other Recommendations Have oral suction available     CHL IP FOLLOW UP  RECOMMENDATIONS 05/06/2014  Follow up Recommendations Home health SLP;Outpatient SLP     CHL IP FREQUENCY AND DURATION 05/06/2014  Speech Therapy Frequency (ACUTE ONLY) min 2x/week  Treatment Duration 2 weeks     Pertinent Vitals/Pain None reported    SLP Swallow Goals No flowsheet data found.  No flowsheet data found.    CHL IP REASON FOR REFERRAL 05/06/2014  Reason for Referral Objectively evaluate swallowing function     CHL IP ORAL PHASE 05/06/2014  Lips (None)  Tongue (None)  Mucous membranes (None)  Nutritional status (None)  Other (None)  Oxygen therapy (None)  Oral Phase WFL  Oral - Pudding Teaspoon (None)  Oral - Pudding Cup (None)  Oral - Honey Teaspoon (None)  Oral - Honey Cup (None)  Oral - Honey Syringe (None)  Oral - Nectar Teaspoon (None)  Oral - Nectar Cup (None)  Oral - Nectar Straw (None)  Oral - Nectar Syringe (None)  Oral - Ice Chips (None)  Oral - Thin Teaspoon (None)  Oral - Thin Cup (None)  Oral - Thin Straw (None)  Oral - Thin Syringe (None)  Oral - Puree (None)  Oral - Mechanical Soft (None)  Oral - Regular (None)  Oral - Multi-consistency (None)  Oral - Pill (None)  Oral Phase - Comment (None)      CHL IP PHARYNGEAL PHASE 05/06/2014  Pharyngeal Phase Impaired  Pharyngeal - Pudding Teaspoon (None)  Penetration/Aspiration details (pudding teaspoon) (None)  Pharyngeal - Pudding Cup (None)  Penetration/Aspiration details (pudding cup) (None)  Pharyngeal - Honey Teaspoon (None)  Penetration/Aspiration details (honey teaspoon) (None)  Pharyngeal - Honey Cup (None)  Penetration/Aspiration details (honey cup) (None)  Pharyngeal - Honey Syringe (None)  Penetration/Aspiration details (honey syringe) (None)  Pharyngeal - Nectar Teaspoon Delayed swallow initiation;Reduced  pharyngeal peristalsis;Reduced epiglottic inversion;Reduced  anterior laryngeal mobility;Reduced laryngeal elevation;Reduced  airway/laryngeal closure;Reduced tongue base  retraction;Pharyngeal residue - valleculae;Pharyngeal residue -   pyriform sinuses;Lateral channel residue;Compensatory strategies  attempted (Comment);Premature spillage to valleculae  Penetration/Aspiration details (nectar teaspoon) (None)  Pharyngeal - Nectar Cup Delayed swallow initiation;Premature  spillage to pyriform sinuses;Reduced pharyngeal  peristalsis;Reduced epiglottic inversion;Reduced anterior  laryngeal mobility;Reduced laryngeal elevation;Reduced  airway/laryngeal closure;Reduced tongue base  retraction;Penetration/Aspiration during swallow;Pharyngeal  residue - valleculae;Pharyngeal residue - pyriform  sinuses;Lateral channel residue;Compensatory strategies attempted  (Comment);Premature spillage to valleculae  Penetration/Aspiration details (nectar cup) Material enters  airway, remains ABOVE vocal cords and not ejected out  Pharyngeal - Nectar Straw (None)  Penetration/Aspiration details (nectar straw) (None)  Pharyngeal - Nectar Syringe (None)  Penetration/Aspiration details (nectar syringe) (None)  Pharyngeal - Ice Chips (None)  Penetration/Aspiration details (ice chips) (None)  Pharyngeal - Thin Teaspoon Delayed swallow initiation;Premature  spillage to valleculae;Premature spillage to pyriform  sinuses;Reduced pharyngeal peristalsis;Reduced epiglottic  inversion;Reduced tongue  base retraction;Reduced anterior  laryngeal mobility;Penetration/Aspiration during swallow;Reduced  laryngeal elevation;Reduced airway/laryngeal closure;Trace  aspiration;Pharyngeal residue - valleculae;Pharyngeal residue -  pyriform sinuses;Pharyngeal residue - posterior pharnyx;Lateral  channel residue;Compensatory strategies attempted (Comment)  Penetration/Aspiration details (thin teaspoon) Material enters  airway, passes BELOW cords without attempt by patient to eject  out (silent aspiration)  Pharyngeal - Thin Cup NT  Penetration/Aspiration details (thin cup) (None)  Pharyngeal - Thin Straw (None)  Penetration/Aspiration details (thin straw) (None)  Pharyngeal - Thin Syringe  (None)  Penetration/Aspiration details (thin syringe') (None)  Pharyngeal - Puree Delayed swallow initiation;Premature spillage  to pyriform sinuses;Reduced pharyngeal peristalsis;Reduced  epiglottic inversion;Reduced anterior laryngeal mobility;Reduced  laryngeal elevation;Reduced airway/laryngeal closure;Reduced  tongue base retraction;Pharyngeal residue - valleculae;Pharyngeal  residue - pyriform sinuses;Lateral channel residue;Compensatory  strategies attempted (Comment);Premature spillage to  valleculae;Pharyngeal residue - posterior pharnyx  Penetration/Aspiration details (puree) Material does not enter  airway  Pharyngeal - Mechanical Soft NT  Penetration/Aspiration details (mechanical soft) (None)  Pharyngeal - Regular (None)  Penetration/Aspiration details (regular) (None)  Pharyngeal - Multi-consistency (None)  Penetration/Aspiration details (multi-consistency) (None)  Pharyngeal - Pill (None)  Penetration/Aspiration details (pill) (None)  Pharyngeal Comment (None)     CHL IP CERVICAL ESOPHAGEAL PHASE 05/06/2014  Cervical Esophageal Phase Impaired  Pudding Teaspoon (None)  Pudding Cup (None)  Honey Teaspoon (None)  Honey Cup (None)  Honey Syringe (None)  Nectar Teaspoon (None)  Nectar Cup (None)  Nectar Straw (None)  Nectar Syringe (None)  Thin Teaspoon (None)  Thin Cup (None)  Thin Straw (None)  Thin Syringe (None)  Cervical Esophageal Comment (No Data)    CHL IP GO 05/02/2014  Functional Assessment Tool Used skilled clinical judgement  Functional Limitations Swallowing  Swallow Current Status (U1324) CL  Swallow Goal Status (M0102) CL  Swallow Discharge Status (V2536) CL  Motor Speech Current Status (U4403) (None)  Motor Speech Goal Status (K7425) (None)  Motor Speech Goal Status (Z5638) (None)  Spoken Language Comprehension Current Status (V5643) (None)  Spoken Language Comprehension Goal Status (P2951) (None)  Spoken Language Comprehension Discharge Status (O8416) (None)  Spoken Language Expression  Current Status (S0630) (None)  Spoken Language Expression Goal Status (Z6010) (None)  Spoken Language Expression Discharge Status (X3235) (None)  Attention Current Status (T7322) (None)  Attention Goal Status (G2542) (None)  Attention Discharge Status (H0623) (None)  Memory Current Status (J6283) (None)  Memory Goal Status (T5176) (None)  Memory Discharge Status (H6073) (None)  Voice Current Status (X1062) (None)  Voice Goal Status (I9485) (None)  Voice Discharge Status (I6270) (None)  Other Speech-Language Pathology Functional Limitation (J5009)  (None)  Other Speech-Language Pathology Functional Limitation Goal Status  (F8182) (None)  Other Speech-Language Pathology Functional Limitation Discharge  Status [phone]) (None)          Celia B. Quentin Ore [facility], CCC-SLP [phone]  Shonna Chock 05/06/2014, 11:26 AM     Objective  Filed Vitals:   05/06/14 0251 05/06/14 0351 05/06/14 0451 05/06/14 0756  BP: 114/65 133/54 141/57 136/71  Pulse:      Temp:  98 F (36.7 C)  98 F (36.7 C)  TempSrc:    Oral  Resp:      Height:      Weight:  68.493 kg (151 lb)    SpO2:  95%     Exam:  General:  NAD  HEENT: no scleral icterus  Cardiovascular: RRR without MRG  Respiratory: CTA biL, no wheezing  Abdomen: soft, non tender  MSK/Extremities: no clubbing or cyanosis   Skin: no  rashes  Neuro: minimal decrease in strength right arm  Data Reviewed: Basic Metabolic Panel:  Recent Labs Lab 05/02/14 1316 05/02/14 1329 05/03/14 0559 05/04/14 0615 05/05/14 0530  NA 135 134* 136 135 135  K 4.8 4.6 3.8 3.9 4.2  CL 94* 93* 96 95* 96  CO2 30  --  31 31 31   GLUCOSE 188* 188* 143* 152* 141*  BUN 24* 29* 18 15 20   CREATININE 0.80 0.80 0.76 0.69 0.90  CALCIUM 9.1  --  9.0 9.0 9.0   Liver Function Tests:  Recent Labs Lab 05/02/14 1316  AST 25  ALT 14  ALKPHOS 72  BILITOT 0.5  PROT 6.2  ALBUMIN 2.6*   CBC:  Recent Labs Lab 05/02/14 1316 05/02/14 1329 05/03/14 0559  05/04/14 0615 05/05/14 0530  WBC 14.5*  --  13.1* 16.8* 16.0*  NEUTROABS 12.1*  --   --   --   --   HGB 12.5* 13.9 11.5* 12.3* 12.1*  HCT 37.1* 41.0 34.3* 36.4* 36.8*  MCV 82.8  --  81.9 82.2 85.0  PLT 303  --  306 313 280   BNP (last 3 results)  Recent Labs  01/05/14 2142  PROBNP 343.7   CBG:  Recent Labs Lab 05/05/14 0642 05/05/14 1124 05/05/14 1640 05/05/14 2122 05/06/14 0753  GLUCAP 152* 131* 129* 140* 126*   Scheduled Meds: . allopurinol  300 mg Oral Daily  . antiseptic oral rinse  7 mL Mouth Rinse q12n4p  . aspirin  325 mg Oral Daily  . atorvastatin  10 mg Oral q1800  . chlorhexidine  15 mL Mouth Rinse BID  . diltiazem  120 mg Oral Daily  . docusate sodium  100 mg Oral BID  . enoxaparin (LOVENOX) injection  40 mg Subcutaneous Q24H  . feeding supplement (ENSURE COMPLETE)  237 mL Oral BID BM  . flecainide  300 mg Oral Once  . fluconazole  100 mg Oral Daily  . Influenza vac split quadrivalent PF  0.5 mL Intramuscular Tomorrow-1000  . insulin aspart  0-15 Units Subcutaneous TID WC  . loratadine  10 mg Oral BH-q7a  . metoprolol  25 mg Oral BID  . miconazole  1 application Topical BID  . nystatin  5 mL Oral QID  . ondansetron  8 mg Oral Q12H  . polyethylene glycol  17 g Oral Daily  . predniSONE  10 mg Oral Q breakfast  . sodium chloride  3 mL Intravenous Q12H  . sucralfate  1 g Oral TID WC & HS   Continuous Infusions: . sodium chloride 75 mL/hr at 05/05/14 2338  . diltiazem (CARDIZEM) infusion 5 mg/hr (05/06/14 0300)    Phillips Climes , MD [facility] Pager [phone] If 7 PM - 7 AM, please contact night-coverage at [email or web address], password [facility] 05/06/2014, 11:38 AM  LOS: 4 days

## 2014-05-06 NOTE — Care Management Note (Unsigned)
    Page 1 of 1   05/06/2014     1:51:04 PM CARE MANAGEMENT NOTE 05/06/2014  Patient:  Erik Ramirez,Erik Ramirez   Account Number:  [id number]  Date Initiated:  05/03/2014  Documentation initiated by:  Olga Coaster  Subjective/Objective Assessment:   ADMITTED WITH RIGHT SIDED WEAKNESS, NEW ONSET HODGKINS LYMPHOMA     Action/Plan:   CM FOLLOWING FOR DCP   Anticipated DC Date:  05/07/2014   Anticipated DC Plan:  SKILLED NURSING FACILITY  In-house referral  Clinical Social Worker      DC Planning Services  CM consult      Choice offered to / List presented to:             Status of service:  In process, will continue to follow Medicare Important Message given?  YES (If response is "NO", the following Medicare IM given date fields will be blank) Date Medicare IM given:  05/05/2014 Medicare IM given by:  Olga Coaster Date Additional Medicare IM given:  05/06/2014 Additional Medicare IM given by:  Ilian Wessell [facility]IGEL[facility]  Discharge Disposition:    Per UR Regulation:  Reviewed for med. necessity/level of care/duration of stay  If discussed at [place] of Stay Meetings, dates discussed:    Comments:  1/19/2016Mindi Slicker RN,BSN,MHA [phone]

## 2014-05-06 NOTE — Progress Notes (Signed)
Noted pt moved to [place] and pt on cardizem IV I will follow up on Monday. I met with pt, his son, and daughter in law at bedside. [phone]

## 2014-05-06 NOTE — Procedures (Signed)
Objective Swallowing Evaluation: Modified Barium Swallowing Study  Patient Details  Name: Erik Ramirez MRN: [id number] Date of Birth: [birth year]  Today's Date: 05/06/2014 Time: SLP Start Time (ACUTE ONLY): 1045-SLP Stop Time (ACUTE ONLY): 1105 SLP Time Calculation (min) (ACUTE ONLY): 20 min  Past Medical History:  Past Medical History  Diagnosis Date  . Hypertension   . Hyperlipemia   . Anxiety   . Lymphadenopathy, abdominal   . Urethral stricture   . Anemia   . BPPV (benign paroxysmal positional vertigo)   . Lymphoma   . Diabetes mellitus without complication   . Obstructive sleep apnea     uses cpap at home  . Sleep apnea    Past Surgical History:  Past Surgical History  Procedure Laterality Date  . Back surgery    . Prostate surgery    . Tonsillectomy     HPI:  HPI: 81 year old male with h/o HTN, anxiety, lymphoma, sleep apnea, DM, anemia seen for OP MBS due to c/o dysphagia x 1 week. Patient and son providing history which includes also within the week, new onset right arm weakness and numbness, right leg numbness, significant generalized weakness, severe vocal quality changes, falls. SLP confirms severe dysphonia (almost aphonic) as well as right sided facial droop.   No Data Recorded  Assessment / Plan / Recommendation CHL IP CLINICAL IMPRESSIONS 05/06/2014  Dysphagia Diagnosis Moderate pharyngeal phase dysphagia;Severe pharyngeal phase dysphagia  Clinical impression Patient continues to present with a moderate-severe primarily pharyngeal dysphagia characterized by base of tongue, laryngeal, and pharyngeal weakness, as well as reduced UES relaxation. This results in poor pharyngeal clearance of all tested consistencies, as well as decreased airway protection of thin liquids (via tsp) and nectar thick liquids (via cup sip). No penetration was noted on puree consistency. Significant post-swallow residual noted pharyngeally across consistencies, which patient continues to  sense, responding with multiple dry swallows or bringing residual back to oral cavity to be suctioned. Various head postures assessed and were again unsuccessful to reduce pharyngeal residuals. Overall, risk of aspiration continues to be high, given severity of deficits, and risk of poor nutrition is also an issue, given the minimal amount of material which successfully passes into the esophagus.  Recommend further esophageal work up to assess appropriateness for management of UES issue.  Pt appears safe for small individual boluses of nectar thick liquid via teaspoon to mitigate disuse atrophy, however, primary nutrition, hydration, and medication is recommended to be NPO at this time.      CHL IP TREATMENT RECOMMENDATION 05/06/2014  Treatment Plan Recommendations Therapy as outlined in treatment plan below     CHL IP DIET RECOMMENDATION 05/06/2014  Diet Recommendations NPO  Liquid Administration via Spoon  Medication Administration Via alternative means  Compensations (None)  Postural Changes and/or Swallow Maneuvers (None)     CHL IP OTHER RECOMMENDATIONS 05/06/2014  Recommended Consults Consider GI evaluation;Consider ENT evaluation  Oral Care Recommendations Oral care Q4 per protocol  Other Recommendations Have oral suction available     CHL IP FOLLOW UP RECOMMENDATIONS 05/06/2014  Follow up Recommendations Home health SLP;Outpatient SLP     CHL IP FREQUENCY AND DURATION 05/06/2014  Speech Therapy Frequency (ACUTE ONLY) min 2x/week  Treatment Duration 2 weeks     Pertinent Vitals/Pain None reported    SLP Swallow Goals No flowsheet data found.  No flowsheet data found.    CHL IP REASON FOR REFERRAL 05/06/2014  Reason for Referral Objectively evaluate swallowing function  CHL IP ORAL PHASE 05/06/2014  Lips (None)  Tongue (None)  Mucous membranes (None)  Nutritional status (None)  Other (None)  Oxygen therapy (None)  Oral Phase WFL  Oral - Pudding Teaspoon (None)  Oral -  Pudding Cup (None)  Oral - Honey Teaspoon (None)  Oral - Honey Cup (None)  Oral - Honey Syringe (None)  Oral - Nectar Teaspoon (None)  Oral - Nectar Cup (None)  Oral - Nectar Straw (None)  Oral - Nectar Syringe (None)  Oral - Ice Chips (None)  Oral - Thin Teaspoon (None)  Oral - Thin Cup (None)  Oral - Thin Straw (None)  Oral - Thin Syringe (None)  Oral - Puree (None)  Oral - Mechanical Soft (None)  Oral - Regular (None)  Oral - Multi-consistency (None)  Oral - Pill (None)  Oral Phase - Comment (None)      CHL IP PHARYNGEAL PHASE 05/06/2014  Pharyngeal Phase Impaired  Pharyngeal - Pudding Teaspoon (None)  Penetration/Aspiration details (pudding teaspoon) (None)  Pharyngeal - Pudding Cup (None)  Penetration/Aspiration details (pudding cup) (None)  Pharyngeal - Honey Teaspoon (None)  Penetration/Aspiration details (honey teaspoon) (None)  Pharyngeal - Honey Cup (None)  Penetration/Aspiration details (honey cup) (None)  Pharyngeal - Honey Syringe (None)  Penetration/Aspiration details (honey syringe) (None)  Pharyngeal - Nectar Teaspoon Delayed swallow initiation;Reduced pharyngeal peristalsis;Reduced epiglottic inversion;Reduced anterior laryngeal mobility;Reduced laryngeal elevation;Reduced airway/laryngeal closure;Reduced tongue base retraction;Pharyngeal residue - valleculae;Pharyngeal residue - pyriform sinuses;Lateral channel residue;Compensatory strategies attempted (Comment);Premature spillage to valleculae  Penetration/Aspiration details (nectar teaspoon) (None)  Pharyngeal - Nectar Cup Delayed swallow initiation;Premature spillage to pyriform sinuses;Reduced pharyngeal peristalsis;Reduced epiglottic inversion;Reduced anterior laryngeal mobility;Reduced laryngeal elevation;Reduced airway/laryngeal closure;Reduced tongue base retraction;Penetration/Aspiration during swallow;Pharyngeal residue - valleculae;Pharyngeal residue - pyriform sinuses;Lateral channel  residue;Compensatory strategies attempted (Comment);Premature spillage to valleculae  Penetration/Aspiration details (nectar cup) Material enters airway, remains ABOVE vocal cords and not ejected out  Pharyngeal - Nectar Straw (None)  Penetration/Aspiration details (nectar straw) (None)  Pharyngeal - Nectar Syringe (None)  Penetration/Aspiration details (nectar syringe) (None)  Pharyngeal - Ice Chips (None)  Penetration/Aspiration details (ice chips) (None)  Pharyngeal - Thin Teaspoon Delayed swallow initiation;Premature spillage to valleculae;Premature spillage to pyriform sinuses;Reduced pharyngeal peristalsis;Reduced epiglottic inversion;Reduced tongue base retraction;Reduced anterior laryngeal mobility;Penetration/Aspiration during swallow;Reduced laryngeal elevation;Reduced airway/laryngeal closure;Trace aspiration;Pharyngeal residue - valleculae;Pharyngeal residue - pyriform sinuses;Pharyngeal residue - posterior pharnyx;Lateral channel residue;Compensatory strategies attempted (Comment)  Penetration/Aspiration details (thin teaspoon) Material enters airway, passes BELOW cords without attempt by patient to eject out (silent aspiration)  Pharyngeal - Thin Cup NT  Penetration/Aspiration details (thin cup) (None)  Pharyngeal - Thin Straw (None)  Penetration/Aspiration details (thin straw) (None)  Pharyngeal - Thin Syringe (None)  Penetration/Aspiration details (thin syringe') (None)  Pharyngeal - Puree Delayed swallow initiation;Premature spillage to pyriform sinuses;Reduced pharyngeal peristalsis;Reduced epiglottic inversion;Reduced anterior laryngeal mobility;Reduced laryngeal elevation;Reduced airway/laryngeal closure;Reduced tongue base retraction;Pharyngeal residue - valleculae;Pharyngeal residue - pyriform sinuses;Lateral channel residue;Compensatory strategies attempted (Comment);Premature spillage to valleculae;Pharyngeal residue - posterior pharnyx  Penetration/Aspiration details (puree)  Material does not enter airway  Pharyngeal - Mechanical Soft NT  Penetration/Aspiration details (mechanical soft) (None)  Pharyngeal - Regular (None)  Penetration/Aspiration details (regular) (None)  Pharyngeal - Multi-consistency (None)  Penetration/Aspiration details (multi-consistency) (None)  Pharyngeal - Pill (None)  Penetration/Aspiration details (pill) (None)  Pharyngeal Comment (None)     CHL IP CERVICAL ESOPHAGEAL PHASE 05/06/2014  Cervical Esophageal Phase Impaired  Pudding Teaspoon (None)  Pudding Cup (None)  Honey Teaspoon (None)  Honey Cup (None)  Honey Syringe (None)  Nectar Teaspoon (None)  Nectar Cup (None)  Nectar Straw (None)  Nectar Syringe (None)  Thin Teaspoon (None)  Thin Cup (None)  Thin Straw (None)  Thin Syringe (None)  Cervical Esophageal Comment (No Data)    CHL IP GO 05/02/2014  Functional Assessment Tool Used skilled clinical judgement  Functional Limitations Swallowing  Swallow Current Status (B2620) CL  Swallow Goal Status (B5597) CL  Swallow Discharge Status (C1638) CL  Motor Speech Current Status (G5364) (None)  Motor Speech Goal Status (W8032) (None)  Motor Speech Goal Status (Z2248) (None)  Spoken Language Comprehension Current Status (G5003) (None)  Spoken Language Comprehension Goal Status (B0488) (None)  Spoken Language Comprehension Discharge Status (Q9169) (None)  Spoken Language Expression Current Status (I5038) (None)  Spoken Language Expression Goal Status (U8280) (None)  Spoken Language Expression Discharge Status [phone]) (None)  Attention Current Status (P9150) (None)  Attention Goal Status (V6979) (None)  Attention Discharge Status (Y8016) (None)  Memory Current Status (P5374) (None)  Memory Goal Status (M2707) (None)  Memory Discharge Status (E6754) (None)  Voice Current Status (G9201) (None)  Voice Goal Status (E0712) (None)  Voice Discharge Status (R9758) (None)  Other Speech-Language Pathology Functional Limitation  [phone]) (None)  Other Speech-Language Pathology Functional Limitation Goal Status (D8264) (None)  Other Speech-Language Pathology Functional Limitation Discharge Status [phone]) (None)          Josilyn Shippee B. Quentin Ore [facility], [place]  Shonna Chock 05/06/2014, 11:26 AM

## 2014-05-06 NOTE — Consult Note (Signed)
Referring Provider: No ref. provider found Primary Care Physician:  Pcp Not In System Primary Gastroenterologist:  Dr. Cindee Lame (now deceased)  Reason for Consultation:  Dysphagia   HPI: Erik Ramirez is a 81 y.o. male with a history of B-cell non-Hodgkins lymphoma diagnosed in September 2015, currently undergoing chemotherapy under direction of Dr. Jana Hakim.  Also has DM, paroxysmal atrial fibrillation, HTN, and OSA.    He was recently seen by Dr. Carlean Purl as inpatient, unassigned on 04/13/2014 for heme positive anemia.  At this time he was admitted with neutropenic fever, anemia, and elevated troponin due to demand ischemia.  He apparently is up-to-date with his colonoscopy with.  It was decided that it was very likely that his anemia was due to chemotherapy toxicity and that although he was heme positive, he was not overtly bleeding so endoscopic evaluation/EGD was not performed.  GI is now being consulted for dysphagia.  He was readmitted to [facility] hospital on 1/18 with difficulty walking, multiple falls, difficulty swallowing, progressive weakness, and FTT.  MBS done showing moderate to severe pharyngeal dysphagia with residues and penetration. Patient was placed on dysphagia 2 diet with nectar liquids, but report also stated "Recommend further esophageal work up to assess appropriateness  for management of UES issue."  "Consider GI consult."  There is apparently concern of something obstructing the UES because reportedly nothing was going down or passing through that area.  Patient family disagrees and says that he ate his entire breakfast without it coming back up.  They are somewhat frustrated and confused about the situation.  Patient's wife thinks that the dysphagia was caused by the chemo.  She says that the swallowing issue was present at the time of his previous hospitalization in December, but has worsened significantly since then.  There is no definite neurologic diagnosis.  MRI's and CT  scans show no acute issues to account for his symptoms.    He does have thrush, for which he has been on fluconazole.  No complaints of painful swallowing.   Past Medical History  Diagnosis Date  . Hypertension   . Hyperlipemia   . Anxiety   . Lymphadenopathy, abdominal   . Urethral stricture   . Anemia   . BPPV (benign paroxysmal positional vertigo)   . Lymphoma   . Diabetes mellitus without complication   . Obstructive sleep apnea     uses cpap at home  . Sleep apnea     Past Surgical History  Procedure Laterality Date  . Back surgery    . Prostate surgery    . Tonsillectomy      Prior to Admission medications   Medication Sig Start Date End Date Taking? Authorizing Provider  allopurinol (ZYLOPRIM) 300 MG tablet Take 1 tablet (300 mg total) by mouth daily. 01/31/14  Yes Minette Headland, NP  aspirin 325 MG tablet Take 1 tablet (325 mg total) by mouth daily. 01/12/14  Yes Kelvin Cellar, MD  clorazepate (TRANXENE) 7.5 MG tablet Take 1 tablet (7.5 mg total) by mouth 2 (two) times daily as needed for anxiety or sleep. Patient taking differently: Take 7.5 mg by mouth every morning.  04/05/14  Yes Chauncey Cruel, MD  clotrimazole-betamethasone (LOTRISONE) cream Apply 1 application topically 2 (two) times daily as needed (for itching).    Yes Historical Provider, MD  diltiazem (CARDIZEM CD) 120 MG 24 hr capsule Take 1 capsule (120 mg total) by mouth daily. 01/31/14  Yes Minette Headland, NP  docusate sodium 100 MG  CAPS Take 100 mg by mouth 2 (two) times daily. 04/17/14  Yes Eugenie Filler, MD  feeding supplement, ENSURE COMPLETE, (ENSURE COMPLETE) LIQD Take 237 mLs by mouth daily. 01/12/14  Yes Kelvin Cellar, MD  fluconazole (DIFLUCAN) 100 MG tablet Take 1 tablet (100 mg total) by mouth daily. 04/17/14  Yes Eugenie Filler, MD  insulin aspart (NOVOLOG) 100 UNIT/ML injection Inject 0-20 Units into the skin 3 (three) times daily with meals. 04/05/14  Yes Chauncey Cruel,  MD  lidocaine-prilocaine (EMLA) cream Apply 1 application topically as needed. APPLY TO PORT SITE 1 HOUR PRIOR TO THERAPY. DO NOT RUB IN. COVER WITH SARAN WRAP. 01/27/14  Yes Chauncey Cruel, MD  loratadine (CLARITIN) 10 MG tablet Take 10 mg by mouth every morning.   Yes Historical Provider, MD  metoprolol (LOPRESSOR) 50 MG tablet Take 25 mg by mouth 2 (two) times daily.     Yes Historical Provider, MD  naproxen (NAPROSYN) 375 MG tablet Take 1 tablet (375 mg total) by mouth 3 (three) times daily with meals. Patient taking differently: Take 375 mg by mouth 3 (three) times daily as needed for moderate pain.  01/17/14  Yes Garald Balding, NP  nystatin (MYCOSTATIN) 100000 UNIT/ML suspension SWISH AND SWALLOW 5 MLS BY MOUTH FOUR TIMES DAILY Patient taking differently: Take 5 mLs by mouth 4 (four) times daily. SWISH AND SWALLOW 03/01/14  Yes Marcelino Duster, NP  ondansetron (ZOFRAN) 8 MG tablet Take 1 tablet (8 mg total) by mouth every 12 (twelve) hours. Patient taking differently: Take 8 mg by mouth 2 (two) times daily as needed for nausea.  01/12/14  Yes Kelvin Cellar, MD  polyethylene glycol (MIRALAX / GLYCOLAX) packet Take 17 g by mouth daily.   Yes Historical Provider, MD  predniSONE (DELTASONE) 10 MG tablet Take 1 tablet (10 mg total) by mouth daily with breakfast. 04/25/14  Yes Marcelino Duster, NP  simvastatin (ZOCOR) 20 MG tablet Take 1 tablet (20 mg total) by mouth daily. 03/21/14  Yes Marcelino Duster, NP  sucralfate (CARAFATE) 1 GM/10ML suspension Take 10 mLs (1 g total) by mouth 4 (four) times daily -  with meals and at bedtime. 04/22/14  Yes Chauncey Cruel, MD  traMADol (ULTRAM) 50 MG tablet Take 50 mg by mouth every 6 (six) hours as needed for moderate pain.   Yes Historical Provider, MD  glucose blood test strip ONE TOUCH ULTRA .CHECK BLOOD SUGAR AC AND HS DAILY 04/05/14   Chauncey Cruel, MD  miconazole (MICATIN) 2 % cream Apply 1 application topically 2 (two) times daily. Apply to  groin area twice daily until clear Patient not taking: Reported on 04/25/2014 02/08/14   Marcelino Duster, NP  predniSONE (DELTASONE) 20 MG tablet Take 3 tablets (60 mg total) by mouth daily with breakfast. Patient taking differently: Take 60 mg by mouth daily with breakfast. For 5 days after treatment 02/22/14   Chauncey Cruel, MD    Current Facility-Administered Medications  Medication Dose Route Frequency Provider Last Rate Last Dose  . 0.9 %  sodium chloride infusion   Intravenous Continuous Nishant Dhungel, MD 75 mL/hr at 05/05/14 2338    . acetaminophen (TYLENOL) tablet 650 mg  650 mg Oral Q6H PRN Nishant Dhungel, MD   650 mg at 05/04/14 2351   Or  . acetaminophen (TYLENOL) suppository 650 mg  650 mg Rectal Q6H PRN Nishant Dhungel, MD      . allopurinol (ZYLOPRIM) tablet 300 mg  300  mg Oral Daily Nishant Dhungel, MD   300 mg at 05/06/14 1136  . antiseptic oral rinse (CPC / CETYLPYRIDINIUM CHLORIDE 0.05%) solution 7 mL  7 mL Mouth Rinse q12n4p Nishant Dhungel, MD   7 mL at 05/05/14 1600  . aspirin tablet 325 mg  325 mg Oral Daily Nishant Dhungel, MD   325 mg at 05/06/14 1137  . atorvastatin (LIPITOR) tablet 10 mg  10 mg Oral q1800 Nishant Dhungel, MD   10 mg at 05/05/14 1832  . chlorhexidine (PERIDEX) 0.12 % solution 15 mL  15 mL Mouth Rinse BID Nishant Dhungel, MD   15 mL at 05/06/14 0820  . clorazepate (TRANXENE) tablet 7.5 mg  7.5 mg Oral BID PRN Nishant Dhungel, MD   7.5 mg at 05/03/14 1204  . diltiazem (CARDIZEM CD) 24 hr capsule 120 mg  120 mg Oral Daily Nishant Dhungel, MD   120 mg at 05/06/14 0820  . diltiazem (CARDIZEM) 100 mg in dextrose 5 % 100 mL (1 mg/mL) infusion  5-15 mg/hr Intravenous Titrated Dianne Dun, NP 5 mL/hr at 05/06/14 0300 5 mg/hr at 05/06/14 0300  . docusate sodium (COLACE) capsule 100 mg  100 mg Oral BID Nishant Dhungel, MD   100 mg at 05/06/14 1136  . enoxaparin (LOVENOX) injection 40 mg  40 mg Subcutaneous Q24H Nishant Dhungel, MD   40 mg at  05/05/14 1829  . feeding supplement (ENSURE COMPLETE) (ENSURE COMPLETE) liquid 237 mL  237 mL Oral BID BM Baird Lyons, RD   237 mL at 05/06/14 1149  . flecainide (TAMBOCOR) tablet 300 mg  300 mg Oral Once Josue Hector, MD      . fluconazole (DIFLUCAN) tablet 100 mg  100 mg Oral Daily Nishant Dhungel, MD   100 mg at 05/04/14 1404  . hydrALAZINE (APRESOLINE) injection 10 mg  10 mg Intravenous Q4H PRN Caren Griffins, MD   10 mg at 05/05/14 2112  . Influenza vac split quadrivalent PF (FLUARIX) injection 0.5 mL  0.5 mL Intramuscular Tomorrow-1000 Nishant Dhungel, MD   0.5 mL at 05/04/14 1411  . insulin aspart (novoLOG) injection 0-15 Units  0-15 Units Subcutaneous TID WC Nishant Dhungel, MD   2 Units at 05/06/14 3976  . loratadine (CLARITIN) tablet 10 mg  10 mg Oral BH-q7a Nishant Dhungel, MD   10 mg at 05/06/14 0820  . metoprolol tartrate (LOPRESSOR) tablet 25 mg  25 mg Oral BID Nishant Dhungel, MD   25 mg at 05/06/14 1137  . miconazole (MICOTIN) 2 % cream 1 application  1 application Topical BID Nishant Dhungel, MD   1 application at 73/41/93 2157  . nystatin (MYCOSTATIN) 100000 UNIT/ML suspension 500,000 Units  5 mL Oral QID Nishant Dhungel, MD   500,000 Units at 05/06/14 1136  . ondansetron (ZOFRAN) tablet 4 mg  4 mg Oral Q6H PRN Nishant Dhungel, MD       Or  . ondansetron (ZOFRAN) injection 4 mg  4 mg Intravenous Q6H PRN Nishant Dhungel, MD      . ondansetron (ZOFRAN) tablet 8 mg  8 mg Oral Q12H Nishant Dhungel, MD   8 mg at 05/06/14 1137  . polyethylene glycol (MIRALAX / GLYCOLAX) packet 17 g  17 g Oral Daily Nishant Dhungel, MD   17 g at 05/06/14 1138  . predniSONE (DELTASONE) tablet 10 mg  10 mg Oral Q breakfast Nishant Dhungel, MD   10 mg at 05/06/14 0820  . [place]   Oral PRN Baird Lyons,  RD      . sodium chloride 0.9 % injection 3 mL  3 mL Intravenous Q12H Nishant Dhungel, MD   3 mL at 05/06/14 1138  . sucralfate (CARAFATE) 1 GM/10ML suspension 1 g  1 g Oral  TID WC & HS Nishant Dhungel, MD   1 g at 05/06/14 0821  . traMADol (ULTRAM) tablet 100 mg  100 mg Oral Q6H PRN Dionne Milo, NP   100 mg at 05/05/14 1639    Allergies as of 05/02/2014 - Review Complete 05/02/2014  Allergen Reaction Noted  . Rituximab Other (See Comments) 01/08/2014  . Caffeine Other (See Comments) 02/15/2011  . Morphine and related Other (See Comments) 02/15/2011  . Penicillins Rash 02/15/2011    No family history on file.  History   Social History  . Marital Status: Married    Spouse Name: N/A    Number of Children: N/A  . Years of Education: N/A   Occupational History  . Not on file.   Social History Main Topics  . Smoking status: Former Smoker -- 30 years    Quit date: 04/12/1968  . Smokeless tobacco: Never Used  . Alcohol Use: No  . Drug Use: No  . Sexual Activity: No   Other Topics Concern  . Not on file   Social History Narrative    Review of Systems: Ten point ROS is O/W negative except as mentioned in HPI.  Physical Exam: Vital signs in last 24 hours: Temp:  [97.9 F (36.6 C)-99.1 F (37.3 C)] 98 F (36.7 C) (01/22 0756) Pulse Rate:  [54-158] 80 (01/21 2336) Resp:  [16-20] 20 (01/21 2336) BP: (95-173)/(54-84) 136/71 mmHg (01/22 0756) SpO2:  [94 %-97 %] 95 % (01/22 0351) Weight:  [150 lb 12.8 oz (68.402 kg)-151 lb (68.493 kg)] 151 lb (68.493 kg) (01/22 0351) Last BM Date: 05/04/14 General:  Alert, chronically ill-appearing, pleasant and cooperative in NAD Head:  Normocephalic and atraumatic.  Some temporal wasting noted. Eyes:  Sclera clear, no icterus.  Conjunctiva pink. Ears:  Normal auditory acuity. Mouth:  No deformity.  He does have thrush on his tongue.   Lungs:  Clear throughout to auscultation.  No wheezes, crackles, or rhonchi.  Heart:  Regular rate and rhythm; no murmurs, clicks, rubs, or gallops. Abdomen:  Soft, non-distended.  BS present.  Non-tender.   Rectal:  Deferred  Msk:  Symmetrical without gross  deformities.  Extremity weakness noted. Pulses:  Normal pulses noted. Extremities:  Without clubbing or edema. Neurologic:  Alert and oriented x 4. Skin:  Intact without significant lesions or rashes.  Intake/Output from previous day: 01/21 0701 - 01/22 0700 In: 43 [I.V.:43] Out: 2 [Urine:1; Stool:1] Intake/Output this shift: Total I/O In: -  Out: 300 [Urine:300]  Lab Results:  Recent Labs  05/04/14 0615 05/05/14 0530  WBC 16.8* 16.0*  HGB 12.3* 12.1*  HCT 36.4* 36.8*  PLT 313 280   BMET  Recent Labs  05/04/14 0615 05/05/14 0530  NA 135 135  K 3.9 4.2  CL 95* 96  CO2 31 31  GLUCOSE 152* 141*  BUN 15 20  CREATININE 0.69 0.90  CALCIUM 9.0 9.0   Studies/Results: Ct Head Wo Contrast  05/05/2014   CLINICAL DATA:  Dysarthria, worsening RIGHT-sided weakness, RIGHT arm pain. Hypertension.  EXAM: CT HEAD WITHOUT CONTRAST  TECHNIQUE: Contiguous axial images were obtained from the base of the skull through the vertex without intravenous contrast.  COMPARISON:  MRI of the head May 02, 2014  FINDINGS:  The ventricles and sulci are normal for age. No intraparenchymal hemorrhage, mass effect nor midline shift. Patchy supratentorial white matter hypodensities are within normal range for patient's age and though non-specific suggest sequelae of chronic small vessel ischemic disease. No acute large vascular territory infarcts.  No abnormal extra-axial fluid collections. Basal cisterns are patent. Moderate calcific atherosclerosis of the carotid siphons.  No skull fracture. The included ocular globes and orbital contents are non-suspicious. The mastoid aircells and included paranasal sinuses are well-aerated.  IMPRESSION: No acute intracranial process.  Involutional changes. Similar mild to moderate white matter changes can be seen with chronic small vessel ischemic disease.   Electronically Signed   By: [name]   On: 05/05/2014 00:41   Mr Cervical Spine W Wo  Contrast  05/04/2014   CLINICAL DATA:  Right hemiparesis. Diffuse large B-cell lymphoma, beginning chemotherapy 12/2013.  EXAM: MRI CERVICAL SPINE WITHOUT AND WITH CONTRAST  TECHNIQUE: Multiplanar and multiecho pulse sequences of the cervical spine, to include the craniocervical junction and cervicothoracic junction, were obtained according to standard protocol without and with intravenous contrast.  CONTRAST:  48mL MULTIHANCE GADOBENATE DIMEGLUMINE 529 MG/ML IV SOLN  COMPARISON:  PET-CT 03/09/2014.  Cervical spine CT 06/29/2007.  FINDINGS: Images are mildly to moderately degraded by motion artifact.  Vertebral alignment is unchanged from prior cervical spine CT, with straightening of the normal cervical lordosis, trace anterolisthesis of C4 on C5, and trace retrolisthesis of C5 on C6. Severe disc space narrowing is again seen at C5-6. Vertebral bone marrow signal is within normal limits aside from mild degenerative marrow changes in the mid to lower cervical spine. No enhancing lesions are identified. Craniocervical junction is unremarkable. Cervical spinal cord is normal in caliber without definite signal abnormality identified.  C2-3:  Small central disc protrusion without stenosis.  C3-4:  Negative.  C4-5:  Negative.  C5-6: Listhesis and endplate osteophytosis result in mild spinal stenosis and mild bilateral neural foraminal stenosis.  C6-7: Minimal disc bulging and uncovertebral spurring without significant stenosis.  C7-T1: Right greater than left facet arthrosis results in minimal right neural foraminal narrowing. No spinal stenosis.  IMPRESSION: Multilevel cervical spondylosis, worst at C5-6 where there is mild spinal stenosis.   Electronically Signed   By: Logan Bores   On: 05/04/2014 14:00   Dg Swallowing Func-speech Pathology  05/06/2014   Lorre Nick, CCC-SLP     05/06/2014 11:27 AM  Objective Swallowing Evaluation: Modified Barium Swallowing Study   Patient Details  Name: Erik Ramirez MRN:  [id number] Date of Birth: 10/29/1925  Today's Date: 05/06/2014 Time: SLP Start Time (ACUTE ONLY): 1045-SLP Stop Time (ACUTE  ONLY): 1105 SLP Time Calculation (min) (ACUTE ONLY): 20 min  Past Medical History:  Past Medical History  Diagnosis Date  . Hypertension   . Hyperlipemia   . Anxiety   . Lymphadenopathy, abdominal   . Urethral stricture   . Anemia   . BPPV (benign paroxysmal positional vertigo)   . Lymphoma   . Diabetes mellitus without complication   . Obstructive sleep apnea     uses cpap at home  . Sleep apnea    Past Surgical History:  Past Surgical History  Procedure Laterality Date  . Back surgery    . Prostate surgery    . Tonsillectomy     HPI:  HPI: 81 year old male with h/o HTN, anxiety, lymphoma, sleep  apnea, DM, anemia seen for OP MBS due to c/o dysphagia x 1 week.  Patient and son  providing history which includes also within the  week, new onset right arm weakness and numbness, right leg  numbness, significant generalized weakness, severe vocal quality  changes, falls. SLP confirms severe dysphonia (almost aphonic) as  well as right sided facial droop.   No Data Recorded  Assessment / Plan / Recommendation CHL IP CLINICAL IMPRESSIONS 05/06/2014  Dysphagia Diagnosis Moderate pharyngeal phase dysphagia;Severe  pharyngeal phase dysphagia  Clinical impression Patient continues to present with a  moderate-severe primarily pharyngeal dysphagia characterized by  base of tongue, laryngeal, and pharyngeal weakness, as well as  reduced UES relaxation. This results in poor pharyngeal clearance  of all tested consistencies, as well as decreased airway  protection of thin liquids (via tsp) and nectar thick liquids  (via cup sip). No penetration was noted on puree consistency.  Significant post-swallow residual noted pharyngeally across  consistencies, which patient continues to sense, responding with  multiple dry swallows or bringing residual back to oral cavity to  be suctioned. Various head postures assessed  and were again  unsuccessful to reduce pharyngeal residuals. Overall, risk of  aspiration continues to be high, given severity of deficits, and  risk of poor nutrition is also an issue, given the minimal amount  of material which successfully passes into the esophagus.   Recommend further esophageal work up to assess appropriateness  for management of UES issue.  Pt appears safe for small  individual boluses of nectar thick liquid via teaspoon to  mitigate disuse atrophy, however, primary nutrition, hydration,  and medication is recommended to be NPO at this time.      CHL IP TREATMENT RECOMMENDATION 05/06/2014  Treatment Plan Recommendations Therapy as outlined in treatment  plan below     CHL IP DIET RECOMMENDATION 05/06/2014  Diet Recommendations NPO  Liquid Administration via Spoon  Medication Administration Via alternative means  Compensations (None)  Postural Changes and/or Swallow Maneuvers (None)     CHL IP OTHER RECOMMENDATIONS 05/06/2014  Recommended Consults Consider GI evaluation;Consider ENT  evaluation  Oral Care Recommendations Oral care Q4 per protocol  Other Recommendations Have oral suction available     CHL IP FOLLOW UP RECOMMENDATIONS 05/06/2014  Follow up Recommendations Home health SLP;Outpatient SLP     CHL IP FREQUENCY AND DURATION 05/06/2014  Speech Therapy Frequency (ACUTE ONLY) min 2x/week  Treatment Duration 2 weeks     Pertinent Vitals/Pain None reported    SLP Swallow Goals No flowsheet data found.  No flowsheet data found.    CHL IP REASON FOR REFERRAL 05/06/2014  Reason for Referral Objectively evaluate swallowing function     CHL IP ORAL PHASE 05/06/2014  Lips (None)  Tongue (None)  Mucous membranes (None)  Nutritional status (None)  Other (None)  Oxygen therapy (None)  Oral Phase WFL  Oral - Pudding Teaspoon (None)  Oral - Pudding Cup (None)  Oral - Honey Teaspoon (None)  Oral - Honey Cup (None)  Oral - Honey Syringe (None)  Oral - Nectar Teaspoon (None)  Oral - Nectar Cup (None)  Oral -  Nectar Straw (None)  Oral - Nectar Syringe (None)  Oral - Ice Chips (None)  Oral - Thin Teaspoon (None)  Oral - Thin Cup (None)  Oral - Thin Straw (None)  Oral - Thin Syringe (None)  Oral - Puree (None)  Oral - Mechanical Soft (None)  Oral - Regular (None)  Oral - Multi-consistency (None)  Oral - Pill (None)  Oral Phase - Comment (None)      CHL IP  PHARYNGEAL PHASE 05/06/2014  Pharyngeal Phase Impaired  Pharyngeal - Pudding Teaspoon (None)  Penetration/Aspiration details (pudding teaspoon) (None)  Pharyngeal - Pudding Cup (None)  Penetration/Aspiration details (pudding cup) (None)  Pharyngeal - Honey Teaspoon (None)  Penetration/Aspiration details (honey teaspoon) (None)  Pharyngeal - Honey Cup (None)  Penetration/Aspiration details (honey cup) (None)  Pharyngeal - Honey Syringe (None)  Penetration/Aspiration details (honey syringe) (None)  Pharyngeal - Nectar Teaspoon Delayed swallow initiation;Reduced  pharyngeal peristalsis;Reduced epiglottic inversion;Reduced  anterior laryngeal mobility;Reduced laryngeal elevation;Reduced  airway/laryngeal closure;Reduced tongue base  retraction;Pharyngeal residue - valleculae;Pharyngeal residue -  pyriform sinuses;Lateral channel residue;Compensatory strategies  attempted (Comment);Premature spillage to valleculae  Penetration/Aspiration details (nectar teaspoon) (None)  Pharyngeal - Nectar Cup Delayed swallow initiation;Premature  spillage to pyriform sinuses;Reduced pharyngeal  peristalsis;Reduced epiglottic inversion;Reduced anterior  laryngeal mobility;Reduced laryngeal elevation;Reduced  airway/laryngeal closure;Reduced tongue base  retraction;Penetration/Aspiration during swallow;Pharyngeal  residue - valleculae;Pharyngeal residue - pyriform  sinuses;Lateral channel residue;Compensatory strategies attempted  (Comment);Premature spillage to valleculae  Penetration/Aspiration details (nectar cup) Material enters  airway, remains ABOVE vocal cords and not ejected out   Pharyngeal - Nectar Straw (None)  Penetration/Aspiration details (nectar straw) (None)  Pharyngeal - Nectar Syringe (None)  Penetration/Aspiration details (nectar syringe) (None)  Pharyngeal - Ice Chips (None)  Penetration/Aspiration details (ice chips) (None)  Pharyngeal - Thin Teaspoon Delayed swallow initiation;Premature  spillage to valleculae;Premature spillage to pyriform  sinuses;Reduced pharyngeal peristalsis;Reduced epiglottic  inversion;Reduced tongue base retraction;Reduced anterior  laryngeal mobility;Penetration/Aspiration during swallow;Reduced  laryngeal elevation;Reduced airway/laryngeal closure;Trace  aspiration;Pharyngeal residue - valleculae;Pharyngeal residue -  pyriform sinuses;Pharyngeal residue - posterior pharnyx;Lateral  channel residue;Compensatory strategies attempted (Comment)  Penetration/Aspiration details (thin teaspoon) Material enters  airway, passes BELOW cords without attempt by patient to eject  out (silent aspiration)  Pharyngeal - Thin Cup NT  Penetration/Aspiration details (thin cup) (None)  Pharyngeal - Thin Straw (None)  Penetration/Aspiration details (thin straw) (None)  Pharyngeal - Thin Syringe (None)  Penetration/Aspiration details (thin syringe') (None)  Pharyngeal - Puree Delayed swallow initiation;Premature spillage  to pyriform sinuses;Reduced pharyngeal peristalsis;Reduced  epiglottic inversion;Reduced anterior laryngeal mobility;Reduced  laryngeal elevation;Reduced airway/laryngeal closure;Reduced  tongue base retraction;Pharyngeal residue - valleculae;Pharyngeal  residue - pyriform sinuses;Lateral channel residue;Compensatory  strategies attempted (Comment);Premature spillage to  valleculae;Pharyngeal residue - posterior pharnyx  Penetration/Aspiration details (puree) Material does not enter  airway  Pharyngeal - Mechanical Soft NT  Penetration/Aspiration details (mechanical soft) (None)  Pharyngeal - Regular (None)  Penetration/Aspiration details (regular) (None)   Pharyngeal - Multi-consistency (None)  Penetration/Aspiration details (multi-consistency) (None)  Pharyngeal - Pill (None)  Penetration/Aspiration details (pill) (None)  Pharyngeal Comment (None)     CHL IP CERVICAL ESOPHAGEAL PHASE 05/06/2014  Cervical Esophageal Phase Impaired  Pudding Teaspoon (None)  Pudding Cup (None)  Honey Teaspoon (None)  Honey Cup (None)  Honey Syringe (None)  Nectar Teaspoon (None)  Nectar Cup (None)  Nectar Straw (None)  Nectar Syringe (None)  Thin Teaspoon (None)  Thin Cup (None)  Thin Straw (None)  Thin Syringe (None)  Cervical Esophageal Comment (No Data)    CHL IP GO 05/02/2014  Functional Assessment Tool Used skilled clinical judgement  Functional Limitations Swallowing  Swallow Current Status (F8182) CL  Swallow Goal Status (X9371) CL  Swallow Discharge Status (I9678) CL  Motor Speech Current Status (L3810) (None)  Motor Speech Goal Status (F7510) (None)  Motor Speech Goal Status (C5852) (None)  Spoken Language Comprehension Current Status (D7824) (None)  Spoken Language Comprehension Goal Status (M3536) (None)  Spoken Language Comprehension Discharge Status (R4431) (None)  Spoken  Language Expression Current Status [phone]) (None)  Spoken Language Expression Goal Status [phone]) (None)  Spoken Language Expression Discharge Status [phone]) (None)  Attention Current Status [phone]) (None)  Attention Goal Status (J4492) (None)  Attention Discharge Status [phone]) (None)  Memory Current Status (H2197) (None)  Memory Goal Status (J8832) (None)  Memory Discharge Status (P4982) (None)  Voice Current Status (M4158) (None)  Voice Goal Status (X0940) (None)  Voice Discharge Status (H6808) (None)  Other Speech-Language Pathology Functional Limitation [phone])  (None)  Other Speech-Language Pathology Functional Limitation Goal Status  (P5945) (None)  Other Speech-Language Pathology Functional Limitation Discharge  Status [phone]) (None)          Celia B. Quentin Ore [facility], CCC-SLP [phone]  Shonna Chock 05/06/2014, 11:26 AM     IMPRESSION:  -Dysphagia:  Moderate to severe pharyngeal dysphagia on MBSS.  Usually this is a muscular or neurologic issues and not something addressed by GI.  There was concern of something obstructing the UES because reportedly nothing was going down or passing through that area.  Patient family disagrees and says that he ate his entire breakfast without it coming back up.  This dysphagia may be induced by the chemo and related to muscle fatigue from his weakness.   -Thrush:  Is on Nystatin for this issue, which should be continued.  PLAN: -Will discuss with Dr. Henrene Pastor, but if he is in fact able to swallow liquids then we could possibly proceed with esophagram to definitively rule out mass, stricture, etc. -Suspect that the dysphagia will improve if his overall condition improves.   ZEHR, JESSICA D.  05/06/2014, 12:05 PM  Pager number [phone]  GI ATTENDING  History, laboratories, x-rays, swallowing studies reviewed. Patient personally seen and examined. Wife, sons, and daughter-in-law present. Agree with H&P as outlined above. I also spoke with neurology, Dr. Doy Mince. The patient has experienced marked decline in his overall condition in the past several weeks. Did not tolerate treatment for lymphoma. Has had progressive difficulty swallowing with episodic choking spells as well as progressive dysphonia. Overall generalized weakness and is currently bedbound. Difficult to arouse this afternoon. This is NOT a primary esophageal process. It appears that profoundly weak musculature explains both dysphagia and dysphonia. Neurology workup to date has been negative but they are looking into possible paraneoplastic syndrome as well. From GI standpoint, endoscopic evaluation is not appropriate. I discussed (long conference with family members with discussion recorded) with the family that this patient's prognosis appears quite poor and that the end is likely near. We  discussed his swallowing evaluation in the high risk for aspiration. We also discussed nutritional support and feeding tubes, which they're not interested in and I agree. To this end, recommendations for supportive and comfort care. They're planning to discuss this with the internal medicine attending physician this afternoon. Will sign off  Benjamin Casanas N. Geri Seminole., M.D. [facility] Division of Gastroenterology

## 2014-05-06 NOTE — Progress Notes (Signed)
Placed pt. On cpap. Pt. Tolerating well at this time. 

## 2014-05-06 NOTE — Progress Notes (Signed)
Physical Therapy Treatment Patient Details Name: Erik Ramirez MRN: [id number] DOB: [birth year] Today's Date: 05/06/2014    History of Present Illness Adm 05/02/14 with Rt sided weakness. Pt with multiple falls since d/c home from hospital early January (last fell 1/10 in bathroom, per son) Xrays Rt shoulder, pelvis/hips negative; MRI brain negative. MRI cervical spine +mild spinal stenosis C5-6 PMHx- FTT post chemotherapy treatments for lymphoma, DM, afib, HTN, BPPV    PT Comments    Pt admitted with above diagnosis. Pt currently with functional limitations due to balance and endurance deficits.  Pt progressing.  Very motivated. HR somewhat high today up to 156 bpm.    Pt will benefit from skilled PT to increase their independence and safety with mobility to allow discharge to the venue listed below.    Follow Up Recommendations  CIR;Supervision/Assistance - 24 hour     Equipment Recommendations  None recommended by PT    Recommendations for Other Services OT consult     Precautions / Restrictions Precautions Precautions: Fall Restrictions Weight Bearing Restrictions: No    Mobility  Bed Mobility Overal bed mobility: Needs Assistance Bed Mobility: Rolling;Sidelying to Sit Rolling: Supervision Sidelying to sit: Min assist     Sit to sidelying: Min guard General bed mobility comments: using bed rails  Transfers Overall transfer level: Needs assistance Equipment used:  (stedy) Transfers: Sit to/from Stand Sit to Stand: Mod assist;+2 safety/equipment;From elevated surface         General transfer comment: Stood x4 with Stedy from the bed.  Pt needed assist for sit to stand but able to stand once up with min guard assist.  STood for up to 1 minute at a time.    Ambulation/Gait                 Stairs            Wheelchair Mobility    Modified Rankin (Stroke Patients Only) Modified Rankin (Stroke Patients Only) Pre-Morbid Rankin Score: Moderately  severe disability Modified Rankin: Severe disability     Balance Overall balance assessment: Needs assistance;History of Falls Sitting-balance support: Single extremity supported Sitting balance-Leahy Scale: Poor Sitting balance - Comments: Pt requires min assist as he fatigues and even mod assist at times if he leans too far posteriorly.   Postural control: Posterior lean Standing balance support: Bilateral upper extremity supported;During functional activity Standing balance-Leahy Scale: Poor Standing balance comment: Stood up to 1 minute in stedy.  Bil knees blocked with shin pad.  Heavy reliance on bil UE support and knees again against pad. Pitches forward when fatigued.                      Cognition Arousal/Alertness: Awake/alert Behavior During Therapy: WFL for tasks assessed/performed Overall Cognitive Status: Within Functional Limits for tasks assessed                      Exercises      General Comments        Pertinent Vitals/Pain Pain Assessment: No/denies pain  HR 120-156 bpm with pt in afib    Home Living                      Prior Function            PT Goals (current goals can now be found in the care plan section) Progress towards PT goals: Progressing toward goals    Frequency  Min 3X/week  PT Plan Current plan remains appropriate    Co-evaluation             End of Session Equipment Utilized During Treatment: Other (comment) Charlaine Dalton) Activity Tolerance: Patient limited by fatigue Patient left: in bed;with call bell/phone within reach     Time: 0842-0910 PT Time Calculation (min) (ACUTE ONLY): 28 min  Charges:  $Therapeutic Activity: 23-37 mins                    G CodesIrwin Brakeman Ramirez 06/04/2014, 9:24 AM Erik Ramirez [facility] [phone] (pager)

## 2014-05-06 NOTE — Progress Notes (Signed)
   05/06/14 1747  Clinical Encounter Type  Visited With Family  Visit Type Spiritual support  Referral From Nurse;Family  Chaplain responded to request for family support as pt declining. Pt's sons, spouse, and daughters-in-law at bedside. Pt spouse tearful and experiencing grief. Some of pt family hopeful for treatment that may bring pt through. Pt spouse conflicted; does not want pt to suffer but does not want to lose him. Chaplain provided emotional/grief support, normalized grief reactions, and provided prayer. Will continue to follow. Page if needed.  Vanetta Mulders  05/06/2014 5:50 PM

## 2014-05-07 LAB — BASIC METABOLIC PANEL
ANION GAP: 4 — AB (ref 5–15)
BUN: 21 mg/dL (ref 6–23)
CO2: 29 mmol/L (ref 19–32)
Calcium: 8.4 mg/dL (ref 8.4–10.5)
Chloride: 103 mmol/L (ref 96–112)
Creatinine, Ser: 0.67 mg/dL (ref 0.50–1.35)
GFR calc Af Amer: >90 mL/min (ref >90)
GFR calc non Af Amer: 85 mL/min — ABNORMAL LOW (ref >90)
GLUCOSE: 139 mg/dL — AB (ref 70–99)
Potassium: 3.7 mmol/L (ref 3.5–5.1)
SODIUM: 136 mmol/L (ref 135–145)

## 2014-05-07 LAB — CBC
HEMATOCRIT: 31 % — AB (ref 39.0–52.0)
HEMOGLOBIN: 10.1 g/dL — AB (ref 13.0–17.0)
MCH: 28.2 pg (ref 26.0–34.0)
MCHC: 32.6 g/dL (ref 30.0–36.0)
MCV: 86.6 fL (ref 78.0–100.0)
Platelets: 221 10*3/uL (ref 150–400)
RBC: 3.58 MIL/uL — AB (ref 4.22–5.81)
RDW: 17.6 % — AB (ref 11.5–15.5)
WBC: 17.2 10*3/uL — ABNORMAL HIGH (ref 4.0–10.5)

## 2014-05-07 LAB — GLUCOSE, CAPILLARY
GLUCOSE-CAPILLARY: 98 mg/dL (ref 70–99)
GLUCOSE-CAPILLARY: 95 mg/dL (ref 70–99)
Glucose-Capillary: 132 mg/dL — ABNORMAL HIGH (ref 70–99)
Glucose-Capillary: 116 mg/dL — ABNORMAL HIGH (ref 70–99)
Glucose-Capillary: 153 mg/dL — ABNORMAL HIGH (ref 70–99)

## 2014-05-07 MED ORDER — DILTIAZEM 12 MG/ML ORAL SUSPENSION: 30.0000 mg | Freq: Four times a day (QID) | ORAL | Status: DC

## 2014-05-07 MED ORDER — IMMUNE GLOBULIN (HUMAN) 10 GM/100ML IV SOLN
400.0000 mg/kg | INTRAVENOUS | Status: DC
  Administered 2014-05-07 – 2014-05-08 (×2): 30 g via INTRAVENOUS
  Filled 2014-05-07 (×4): qty 300

## 2014-05-07 MED ORDER — DOCUSATE SODIUM 50 MG/5ML PO LIQD
100.0000 mg | Freq: Two times a day (BID) | ORAL | Status: DC
  Administered 2014-05-07 – 2014-05-09 (×4): 100 mg
  Filled 2014-05-07 (×4): qty 10

## 2014-05-07 MED ORDER — ALLOPURINOL 300 MG PO TABS
300.0000 mg | ORAL_TABLET | Freq: Every day | ORAL | Status: DC
  Administered 2014-05-08 – 2014-05-09 (×2): 300 mg
  Filled 2014-05-07 (×2): qty 1

## 2014-05-07 MED ORDER — ATORVASTATIN CALCIUM 10 MG PO TABS
10.0000 mg | ORAL_TABLET | Freq: Every day | ORAL | Status: DC
  Administered 2014-05-07 – 2014-05-08 (×2): 10 mg
  Filled 2014-05-07 (×2): qty 1

## 2014-05-07 MED ORDER — ASPIRIN 325 MG PO TABS
325.0000 mg | ORAL_TABLET | Freq: Every day | ORAL | Status: DC
  Administered 2014-05-08 – 2014-05-09 (×2): 325 mg
  Filled 2014-05-07 (×2): qty 1

## 2014-05-07 MED ORDER — LORATADINE 10 MG PO TABS
10.0000 mg | ORAL_TABLET | ORAL | Status: DC
  Administered 2014-05-08 – 2014-05-09 (×2): 10 mg
  Filled 2014-05-07 (×2): qty 1

## 2014-05-07 MED ORDER — IMMUNE GLOBULIN (HUMAN) 10 GM/200ML IV SOLN
400.0000 mg/kg | INTRAVENOUS | Status: DC
  Filled 2014-05-07: qty 600

## 2014-05-07 MED ORDER — JEVITY 1.2 CAL PO LIQD
1000.0000 mL | ORAL | Status: DC
  Administered 2014-05-07 – 2014-05-08 (×3): 1000 mL
  Filled 2014-05-07 (×6): qty 1000

## 2014-05-07 MED ORDER — CLORAZEPATE DIPOTASSIUM 3.75 MG PO TABS
7.5000 mg | ORAL_TABLET | Freq: Two times a day (BID) | ORAL | Status: DC | PRN
  Administered 2014-05-07 – 2014-05-09 (×2): 7.5 mg
  Filled 2014-05-07 (×2): qty 2

## 2014-05-07 MED ORDER — ENSURE COMPLETE PO LIQD: 237.0000 mL | Freq: Two times a day (BID) | ORAL | Status: DC

## 2014-05-07 MED ORDER — POLYETHYLENE GLYCOL 3350 17 G PO PACK
17.0000 g | PACK | Freq: Every day | ORAL | Status: DC
  Administered 2014-05-08 – 2014-05-09 (×2): 17 g
  Filled 2014-05-07 (×2): qty 1

## 2014-05-07 MED ORDER — TRAMADOL HCL 50 MG PO TABS: 100.0000 mg | ORAL_TABLET | Freq: Four times a day (QID) | ORAL | Status: DC | PRN

## 2014-05-07 MED ORDER — SODIUM CHLORIDE 0.9 % IJ SOLN
10.0000 mL | INTRAMUSCULAR | Status: DC | PRN
  Administered 2014-05-08 – 2014-05-10 (×2): 10 mL
  Filled 2014-05-07 (×2): qty 40

## 2014-05-07 MED ORDER — PREDNISONE 10 MG PO TABS
10.0000 mg | ORAL_TABLET | Freq: Every day | ORAL | Status: DC
  Administered 2014-05-08 – 2014-05-09 (×2): 10 mg
  Filled 2014-05-07 (×2): qty 1

## 2014-05-07 MED ORDER — JEVITY 1.2 CAL PO LIQD: 1000.0000 mL | ORAL | Status: DC

## 2014-05-07 MED ORDER — METOPROLOL TARTRATE 50 MG PO TABS
50.0000 mg | ORAL_TABLET | Freq: Two times a day (BID) | ORAL | Status: DC
  Administered 2014-05-07 – 2014-05-09 (×5): 50 mg
  Filled 2014-05-07 (×5): qty 1

## 2014-05-07 MED ORDER — DILTIAZEM 12 MG/ML ORAL SUSPENSION
30.0000 mg | Freq: Four times a day (QID) | ORAL | Status: DC
  Administered 2014-05-07 – 2014-05-08 (×7): 30 mg
  Filled 2014-05-07 (×12): qty 3

## 2014-05-07 MED ORDER — INSULIN ASPART 100 UNIT/ML ~~LOC~~ SOLN
0.0000 [IU] | Freq: Four times a day (QID) | SUBCUTANEOUS | Status: DC
  Administered 2014-05-07: 3 [IU] via SUBCUTANEOUS
  Administered 2014-05-08: 2 [IU] via SUBCUTANEOUS
  Administered 2014-05-08 – 2014-05-09 (×4): 3 [IU] via SUBCUTANEOUS

## 2014-05-07 NOTE — Progress Notes (Signed)
NUTRITION FOLLOW UP  DOCUMENTATION CODES Per approved criteria  -Severe malnutrition in the context of chronic illness    Intervention:   D/C Ensure Complete  TF recommendations: Continue Jevity 1.2 @ 20 ml/hr via NGT and increase by 10 ml every 4 hours to goal rate of 75 ml/hr.  Tube feeding regimen provides 2160 kcal (100% of needs), 100 grams of protein, and 1458 ml of H2O.   When IV infusions are d/c's, add 100 ml free water flushes q 4 hours to provide a total of 2058 ml of water daily.   Nutrition Dx:   Inadequate oral intake related to thrush/dysphagia as evidenced by <75% PO intake in the past month; ongoing  Goal:   Pt to meet >/= 90% of their estimated nutrition needs    Monitor:   TF rate/tolerance, weight, labs  Assessment:   Pt presents with neutropenic fever and anemia. Hx of lymphoma, undergoing chemotherapy, DM, HTN, Stage III CKD, and sleep apnea. Chemotherapy is currently paused. Pt has been feeling weak and the past 2-3 days his right leg feels like "lead". He reports right sided weakness and appears to have a right side facial droop. Pt reports poor appetite and difficulty swallowing due to thrush. SLP performed Modified Barium Swallow Study today, he is now on a Dysphagia I diet and nectar thick liquids.   RD re-consulted for nutrition assessment. Pt has lost another 7 lbs in the past month. Pt downgraded again to nothing by mouth after he failed another modified barium swallow on 1/22. Pt is currently receiving Jevity 1.2 via NGT at 20 ml/hr. RD discussed enteral nutrition with patient. He denies any questions or concerns at this time.  Labs: low hemoglobin  Height: Ht Readings from Last 1 Encounters:  05/05/14 5\' 9"  (1.753 m)    Weight Status:   Wt Readings from Last 1 Encounters:  05/07/14 152 lb 12.8 oz (69.31 kg)    Re-estimated needs:  Kcal: 2000-2200 Protein: 85-100 grams Fluid: 2-2.2 L/day  Skin: intact  Diet Order: Diet NPO time  specified Except for: Other (See Comments)   Intake/Output Summary (Last 24 hours) at 05/07/14 0940 Last data filed at 05/07/14 0700  Gross per 24 hour  Intake   1045 ml  Output    925 ml  Net    120 ml    Last BM: 1/20   Labs:   Recent Labs Lab 05/04/14 0615 05/05/14 0530 05/07/14 0306  NA 135 135 136  K 3.9 4.2 3.7  CL 95* 96 103  CO2 31 31 29   BUN 15 20 21   CREATININE 0.69 0.90 0.67  CALCIUM 9.0 9.0 8.4  GLUCOSE 152* 141* 139*    CBG (last 3)   Recent Labs  05/06/14 1216 05/06/14 2052 05/07/14 0738  GLUCAP 168* 98 132*    Scheduled Meds: . allopurinol  300 mg Oral Daily  . antiseptic oral rinse  7 mL Mouth Rinse q12n4p  . aspirin  325 mg Oral Daily  . atorvastatin  10 mg Oral q1800  . chlorhexidine  15 mL Mouth Rinse BID  . diltiazem  120 mg Oral Daily  . docusate  100 mg Oral BID  . enoxaparin (LOVENOX) injection  40 mg Subcutaneous Q24H  . feeding supplement (ENSURE COMPLETE)  237 mL Oral BID BM  . Immune Globulin 5%  400 mg/kg Intravenous Q24 Hr x 5  . Influenza vac split quadrivalent PF  0.5 mL Intramuscular Tomorrow-1000  . insulin aspart  0-15 Units  Subcutaneous TID WC  . loratadine  10 mg Oral BH-q7a  . metoprolol tartrate  50 mg Per Tube BID  . miconazole  1 application Topical BID  . nystatin  5 mL Oral QID  . ondansetron  8 mg Oral Q12H  . polyethylene glycol  17 g Oral Daily  . predniSONE  10 mg Oral Q breakfast  . sodium chloride  3 mL Intravenous Q12H  . sucralfate  1 g Oral TID WC & HS    Continuous Infusions: . sodium chloride 75 mL/hr at 05/07/14 0455  . diltiazem (CARDIZEM) infusion Stopped (05/06/14 1953)  . feeding supplement (JEVITY 1.2 CAL) 1,000 mL (05/07/14 0057)    Pryor Ochoa RD, LDN Inpatient Clinical Dietitian Pager: [phone] After Hours Pager: [phone]

## 2014-05-07 NOTE — Progress Notes (Signed)
PROGRESS NOTE  Erik Ramirez:[id number] DOB: Apr 01, 1926 DOA: 05/02/2014 PCP: Pcp Not In System  HPI: 81 year old male with history of diffuse large B-cell lymphoma on chemotherapy, diabetes mellitus, paroxysmal A. fib, hypertension, obstructive sleep apnea was recently hospitalized for neutropenic fever and anemia, elevated troponin due to demand ischemia, and was discharged home with home health. Patient reports that he has been feeling weak since his discharge and not having enough strength in his legs. He fell in his bathroom about 10 days back and says that his legs gave away. He thinks he may have hit his right scapula, hip and right knee and leg. Since then he has been progressively feeling weak and having pain in his right shoulder, hips and the right leg. For past 2-3 days he reports that his right leg feels like "lead ". He denies any syncope, headache or dizziness. He denies any tingling or numbness of his extremities. Denies any bowel or urinary symptoms. Denies any blurred vision. He reports poor appetite and difficulty swallowing due to oral thrush. Patient denies fever, chills, nausea , vomiting, chest pain, palpitations, SOB, abdominal pain, bowel or urinary symptoms. As reported that his right hand has been week for past 1 week. Pt had presented for outpatient swallow eval today and was referred to ED given complains of right-sided weakness.  Hospital course  Patient admitted for further workup, MRI brain, MRI cervical spine, did not show any acute findings, no evidence of acute CVA, or metastatic disease,  patient's symptoms most likely to generalized deconditioning, patient did fail swallow evaluation initially, but on repeat evaluation he was advanced to dysphagia 2 diet, unfortunately to be downgraded again to nothing by mouth after he failed another modified barium swallow on 1/22, NG tube was inserted, and started on tube feeds 1/23, patient was started on trial of IVIG for  possible paraneoplastic syndrome giving his continued weakness. - Patient has known history of A. fib, went into A. fib with RVR on 1/22, where he was started on Cardizem drip. Cardiology were consulted, where they adjusted his Cardizem dosing and metoprolol dosing, and give him one dose of flecainide. Subjective / 24 H Interval events Pain better this morning,significant events overnight, no fever, no cough, no chest pain, no shortness of breath.  Assessment/Plan: Active Problems:   Diabetes mellitus without complication   DLBCL (diffuse large B cell lymphoma)   CKD (chronic kidney disease), stage III   Mucositis due to antineoplastic therapy   Anemia in neoplastic disease   Protein-calorie malnutrition, severe   Fall at home   Weakness generalized   Right hemiparesis   PAF (paroxysmal atrial fibrillation)   Oropharyngeal dysphagia   Feeding difficulties   weakness - Neurology consulted, appreciate input. Symptoms was felt more likely secondary to generalized weakness , cachectic neuropathy ,in the absence of significant finding on the imaging, as no metastatic disease or acute CVA was on imaging, patient has progressive weakness, so paraneoplastic syndrome is being considered, myasthenia  gravis workup has been sent, discussed with neurology, patient's family, will start patient on IVIG empirically, - MRI brain is negative, - MRI C spine showing Multilevel cervical spondylosis, worst at C5-6 where there is mild spinal stenosis, discussed with neurosurgery on call Dr. Ronnald Ramp,  these findings wouldn't cause the patient symptoms. - subjectively better - Discussed with oncology on call, CNS involvement is unlikely with B-cell lymphoma, no evidence of metastatic is on imaging. - Carotid Doppler no significant stenosis - 2-D echo showing EF of  55% with grade 1 diastolic dysfunction. - PT/OT .  A. fib with RVR - Patient on metoprolol and Cardizem - Currently on Cardizem drip - Cardiology  consult appreciated, flecainide 1 dose - Not and the coagulation candidate given his lymphoma, age, severe deconditioning, and chemotherapy, and history of thrombocytopenia.  Dysphagia - SLP evaluation appreciated, patient failed another in MBS evaluation . - Gastroenterology input appreciated, dysphagia felt not a primary esophageal process, and most likely secondary to generalized weakness and deconditioning. - Started on tube feeds via NG tube.  Right shoulder and hip pain - Secondary to fall. No fractures on XR  Leukocytosis - Most likely related to steroids, around baseline, x-ray showing no opacity or infiltrate, urinalysis is  Negative 1/18 - Continue to monitor  DLBCL (diffuse large B cell lymphoma)  - Following with Dr. Jana Hakim as outpatient and on chemotherapy.  Diabetes mellitus type 2  continue SSI  CKD (chronic kidney disease), stage III  Renal function stable.   Mucositis due to antineoplastic therapy  - Treated with Diflucan. - Continue with nystatin.  Anemia in neoplastic disease  H&H stable  Protein-calorie malnutrition, severe  conitnue supplements, on tube feeds, consulted dietitian.  HTN  continue home medications, and hydralazine PRN  Diet: Diet NPO time specified Except for: Other (See Comments) Fluids: none DVT Prophylaxis: Lovenox  Code Status: DNR Family Communication: Had a family meeting yesterday with multiple family members, discussed today with son at bedside. adisposition plan: For CIR placement when stable.   Consults Neurology  Cardiology Gastroenterology  Procedures:  None    Antibiotics  Anti-infectives    Start     Dose/Rate Route Frequency Ordered Stop   05/02/14 1830  fluconazole (DIFLUCAN) tablet 100 mg  Status:  Discontinued     100 mg Oral Daily 05/02/14 1821 05/07/14 0701       Studies  Dg Abd Portable 1v  05/06/2014   CLINICAL DATA:  Nasogastric tube placement  EXAM: PORTABLE ABDOMEN - 1 VIEW   COMPARISON:  None.  FINDINGS: Nasogastric tube is identified distal tip in the proximal stomach. The visualized bowel gas pattern is normal. There is minimal left pleural effusion.  IMPRESSION: Nasogastric tube noted with distal tip in the proximal stomach.   Electronically Signed   By: Abelardo Diesel M.D.   On: 05/06/2014 19:09   Dg Swallowing Func-speech Pathology  05/06/2014   Lorre Nick, CCC-SLP     05/06/2014 11:27 AM  Objective Swallowing Evaluation: Modified Barium Swallowing Study   Patient Details  Name: Erik Ramirez MRN: [id number] Date of Birth: 1925-05-09  Today's Date: 05/06/2014 Time: SLP Start Time (ACUTE ONLY): 1045-SLP Stop Time (ACUTE  ONLY): 1105 SLP Time Calculation (min) (ACUTE ONLY): 20 min  Past Medical History:  Past Medical History  Diagnosis Date  . Hypertension   . Hyperlipemia   . Anxiety   . Lymphadenopathy, abdominal   . Urethral stricture   . Anemia   . BPPV (benign paroxysmal positional vertigo)   . Lymphoma   . Diabetes mellitus without complication   . Obstructive sleep apnea     uses cpap at home  . Sleep apnea    Past Surgical History:  Past Surgical History  Procedure Laterality Date  . Back surgery    . Prostate surgery    . Tonsillectomy     HPI:  HPI: 81 year old male with h/o HTN, anxiety, lymphoma, sleep  apnea, DM, anemia seen for OP MBS due to c/o dysphagia x  1 week.  Patient and son providing history which includes also within the  week, new onset right arm weakness and numbness, right leg  numbness, significant generalized weakness, severe vocal quality  changes, falls. SLP confirms severe dysphonia (almost aphonic) as  well as right sided facial droop.   No Data Recorded  Assessment / Plan / Recommendation CHL IP CLINICAL IMPRESSIONS 05/06/2014  Dysphagia Diagnosis Moderate pharyngeal phase dysphagia;Severe  pharyngeal phase dysphagia  Clinical impression Patient continues to present with a  moderate-severe primarily pharyngeal dysphagia characterized by  base of  tongue, laryngeal, and pharyngeal weakness, as well as  reduced UES relaxation. This results in poor pharyngeal clearance  of all tested consistencies, as well as decreased airway  protection of thin liquids (via tsp) and nectar thick liquids  (via cup sip). No penetration was noted on puree consistency.  Significant post-swallow residual noted pharyngeally across  consistencies, which patient continues to sense, responding with  multiple dry swallows or bringing residual back to oral cavity to  be suctioned. Various head postures assessed and were again  unsuccessful to reduce pharyngeal residuals. Overall, risk of  aspiration continues to be high, given severity of deficits, and  risk of poor nutrition is also an issue, given the minimal amount  of material which successfully passes into the esophagus.   Recommend further esophageal work up to assess appropriateness  for management of UES issue.  Pt appears safe for small  individual boluses of nectar thick liquid via teaspoon to  mitigate disuse atrophy, however, primary nutrition, hydration,  and medication is recommended to be NPO at this time.      CHL IP TREATMENT RECOMMENDATION 05/06/2014  Treatment Plan Recommendations Therapy as outlined in treatment  plan below     CHL IP DIET RECOMMENDATION 05/06/2014  Diet Recommendations NPO  Liquid Administration via Spoon  Medication Administration Via alternative means  Compensations (None)  Postural Changes and/or Swallow Maneuvers (None)     CHL IP OTHER RECOMMENDATIONS 05/06/2014  Recommended Consults Consider GI evaluation;Consider ENT  evaluation  Oral Care Recommendations Oral care Q4 per protocol  Other Recommendations Have oral suction available     CHL IP FOLLOW UP RECOMMENDATIONS 05/06/2014  Follow up Recommendations Home health SLP;Outpatient SLP     CHL IP FREQUENCY AND DURATION 05/06/2014  Speech Therapy Frequency (ACUTE ONLY) min 2x/week  Treatment Duration 2 weeks     Pertinent Vitals/Pain None reported     SLP Swallow Goals No flowsheet data found.  No flowsheet data found.    CHL IP REASON FOR REFERRAL 05/06/2014  Reason for Referral Objectively evaluate swallowing function     CHL IP ORAL PHASE 05/06/2014  Lips (None)  Tongue (None)  Mucous membranes (None)  Nutritional status (None)  Other (None)  Oxygen therapy (None)  Oral Phase WFL  Oral - Pudding Teaspoon (None)  Oral - Pudding Cup (None)  Oral - Honey Teaspoon (None)  Oral - Honey Cup (None)  Oral - Honey Syringe (None)  Oral - Nectar Teaspoon (None)  Oral - Nectar Cup (None)  Oral - Nectar Straw (None)  Oral - Nectar Syringe (None)  Oral - Ice Chips (None)  Oral - Thin Teaspoon (None)  Oral - Thin Cup (None)  Oral - Thin Straw (None)  Oral - Thin Syringe (None)  Oral - Puree (None)  Oral - Mechanical Soft (None)  Oral - Regular (None)  Oral - Multi-consistency (None)  Oral - Pill (None)  Oral Phase - Comment (None)  CHL IP PHARYNGEAL PHASE 05/06/2014  Pharyngeal Phase Impaired  Pharyngeal - Pudding Teaspoon (None)  Penetration/Aspiration details (pudding teaspoon) (None)  Pharyngeal - Pudding Cup (None)  Penetration/Aspiration details (pudding cup) (None)  Pharyngeal - Honey Teaspoon (None)  Penetration/Aspiration details (honey teaspoon) (None)  Pharyngeal - Honey Cup (None)  Penetration/Aspiration details (honey cup) (None)  Pharyngeal - Honey Syringe (None)  Penetration/Aspiration details (honey syringe) (None)  Pharyngeal - Nectar Teaspoon Delayed swallow initiation;Reduced  pharyngeal peristalsis;Reduced epiglottic inversion;Reduced  anterior laryngeal mobility;Reduced laryngeal elevation;Reduced  airway/laryngeal closure;Reduced tongue base  retraction;Pharyngeal residue - valleculae;Pharyngeal residue -  pyriform sinuses;Lateral channel residue;Compensatory strategies  attempted (Comment);Premature spillage to valleculae  Penetration/Aspiration details (nectar teaspoon) (None)  Pharyngeal - Nectar Cup Delayed swallow initiation;Premature  spillage to  pyriform sinuses;Reduced pharyngeal  peristalsis;Reduced epiglottic inversion;Reduced anterior  laryngeal mobility;Reduced laryngeal elevation;Reduced  airway/laryngeal closure;Reduced tongue base  retraction;Penetration/Aspiration during swallow;Pharyngeal  residue - valleculae;Pharyngeal residue - pyriform  sinuses;Lateral channel residue;Compensatory strategies attempted  (Comment);Premature spillage to valleculae  Penetration/Aspiration details (nectar cup) Material enters  airway, remains ABOVE vocal cords and not ejected out  Pharyngeal - Nectar Straw (None)  Penetration/Aspiration details (nectar straw) (None)  Pharyngeal - Nectar Syringe (None)  Penetration/Aspiration details (nectar syringe) (None)  Pharyngeal - Ice Chips (None)  Penetration/Aspiration details (ice chips) (None)  Pharyngeal - Thin Teaspoon Delayed swallow initiation;Premature  spillage to valleculae;Premature spillage to pyriform  sinuses;Reduced pharyngeal peristalsis;Reduced epiglottic  inversion;Reduced tongue base retraction;Reduced anterior  laryngeal mobility;Penetration/Aspiration during swallow;Reduced  laryngeal elevation;Reduced airway/laryngeal closure;Trace  aspiration;Pharyngeal residue - valleculae;Pharyngeal residue -  pyriform sinuses;Pharyngeal residue - posterior pharnyx;Lateral  channel residue;Compensatory strategies attempted (Comment)  Penetration/Aspiration details (thin teaspoon) Material enters  airway, passes BELOW cords without attempt by patient to eject  out (silent aspiration)  Pharyngeal - Thin Cup NT  Penetration/Aspiration details (thin cup) (None)  Pharyngeal - Thin Straw (None)  Penetration/Aspiration details (thin straw) (None)  Pharyngeal - Thin Syringe (None)  Penetration/Aspiration details (thin syringe') (None)  Pharyngeal - Puree Delayed swallow initiation;Premature spillage  to pyriform sinuses;Reduced pharyngeal peristalsis;Reduced  epiglottic inversion;Reduced anterior laryngeal mobility;Reduced   laryngeal elevation;Reduced airway/laryngeal closure;Reduced  tongue base retraction;Pharyngeal residue - valleculae;Pharyngeal  residue - pyriform sinuses;Lateral channel residue;Compensatory  strategies attempted (Comment);Premature spillage to  valleculae;Pharyngeal residue - posterior pharnyx  Penetration/Aspiration details (puree) Material does not enter  airway  Pharyngeal - Mechanical Soft NT  Penetration/Aspiration details (mechanical soft) (None)  Pharyngeal - Regular (None)  Penetration/Aspiration details (regular) (None)  Pharyngeal - Multi-consistency (None)  Penetration/Aspiration details (multi-consistency) (None)  Pharyngeal - Pill (None)  Penetration/Aspiration details (pill) (None)  Pharyngeal Comment (None)     CHL IP CERVICAL ESOPHAGEAL PHASE 05/06/2014  Cervical Esophageal Phase Impaired  Pudding Teaspoon (None)  Pudding Cup (None)  Honey Teaspoon (None)  Honey Cup (None)  Honey Syringe (None)  Nectar Teaspoon (None)  Nectar Cup (None)  Nectar Straw (None)  Nectar Syringe (None)  Thin Teaspoon (None)  Thin Cup (None)  Thin Straw (None)  Thin Syringe (None)  Cervical Esophageal Comment (No Data)    CHL IP GO 05/02/2014  Functional Assessment Tool Used skilled clinical judgement  Functional Limitations Swallowing  Swallow Current Status (Q4696) CL  Swallow Goal Status (E9528) CL  Swallow Discharge Status (U1324) CL  Motor Speech Current Status (M0102) (None)  Motor Speech Goal Status (V2536) (None)  Motor Speech Goal Status (U4403) (None)  Spoken Language Comprehension Current Status (K7425) (None)  Spoken Language Comprehension Goal Status (Z5638) (None)  Spoken Language Comprehension Discharge Status (V5643) (None)  Spoken Language Expression Current Status [phone]) (None)  Spoken Language Expression Goal Status [phone]) (None)  Spoken Language Expression Discharge Status [phone]) (None)  Attention Current Status [phone]) (None)  Attention Goal Status (Y0459) (None)  Attention Discharge Status [phone])  (None)  Memory Current Status (S2395) (None)  Memory Goal Status (V2023) (None)  Memory Discharge Status (X4356) (None)  Voice Current Status (Y6168) (None)  Voice Goal Status (H7290) (None)  Voice Discharge Status (S1115) (None)  Other Speech-Language Pathology Functional Limitation [phone])  (None)  Other Speech-Language Pathology Functional Limitation Goal Status  (E2336) (None)  Other Speech-Language Pathology Functional Limitation Discharge  Status [phone]) (None)          Celia B. Quentin Ore [facility], CCC-SLP [phone]  Shonna Chock 05/06/2014, 11:26 AM     Objective  Filed Vitals:   05/07/14 0052 05/07/14 0336 05/07/14 0739 05/07/14 0858  BP: 145/82 143/77 155/93 170/79  Pulse: 59 76 118 86  Temp: 98.1 F (36.7 C) 98.5 F (36.9 C) 98.2 F (36.8 C)   TempSrc: Oral Oral Oral   Resp: 17 19 17    Height:      Weight:  69.31 kg (152 lb 12.8 oz)    SpO2: 97% 98% 100%    Exam:  General:  NAD, awake alert  HEENT: no scleral icterus  Cardiovascular: RRR without MRG  Respiratory: CTA biL, no wheezing  Abdomen: soft, non tender  MSK/Extremities: no clubbing or cyanosis   Skin: no rashes  Neuro: Generalized weakness, weakness in proximal muscles in lower extremity.  Data Reviewed: Basic Metabolic Panel:  Recent Labs Lab 05/02/14 1316 05/02/14 1329 05/03/14 0559 05/04/14 0615 05/05/14 0530 05/07/14 0306  NA 135 134* 136 135 135 136  K 4.8 4.6 3.8 3.9 4.2 3.7  CL 94* 93* 96 95* 96 103  CO2 30  --  31 31 31 29   GLUCOSE 188* 188* 143* 152* 141* 139*  BUN 24* 29* 18 15 20 21   CREATININE 0.80 0.80 0.76 0.69 0.90 0.67  CALCIUM 9.1  --  9.0 9.0 9.0 8.4   Liver Function Tests:  Recent Labs Lab 05/02/14 1316  AST 25  ALT 14  ALKPHOS 72  BILITOT 0.5  PROT 6.2  ALBUMIN 2.6*   CBC:  Recent Labs Lab 05/02/14 1316 05/02/14 1329 05/03/14 0559 05/04/14 0615 05/05/14 0530 05/07/14 0306  WBC 14.5*  --  13.1* 16.8* 16.0* 17.2*  NEUTROABS 12.1*  --   --   --    --   --   HGB 12.5* 13.9 11.5* 12.3* 12.1* 10.1*  HCT 37.1* 41.0 34.3* 36.4* 36.8* 31.0*  MCV 82.8  --  81.9 82.2 85.0 86.6  PLT 303  --  306 313 280 221   BNP (last 3 results)  Recent Labs  01/05/14 2142  PROBNP 343.7   CBG:  Recent Labs Lab 05/05/14 2122 05/06/14 0753 05/06/14 1216 05/06/14 2052 05/07/14 0738  GLUCAP 140* 126* 168* 98 132*   Scheduled Meds: . [START ON 05/08/2014] allopurinol  300 mg Per Tube Daily  . antiseptic oral rinse  7 mL Mouth Rinse q12n4p  . [START ON 05/08/2014] aspirin  325 mg Per Tube Daily  . atorvastatin  10 mg Per Tube q1800  . chlorhexidine  15 mL Mouth Rinse BID  . diltiazem  30 mg Per Tube 4 times per day  . docusate  100 mg Per Tube BID  . enoxaparin (LOVENOX) injection  40 mg Subcutaneous Q24H  . feeding supplement (ENSURE COMPLETE)  237 mL Per Tube BID BM  . IMMUNE GLOBULIN 10% (HUMAN) IV - For Fluid Restriction Only  400 mg/kg Intravenous Q24H  . Influenza vac split quadrivalent PF  0.5 mL Intramuscular Tomorrow-1000  . insulin aspart  0-15 Units Subcutaneous Q6H  . [START ON 05/08/2014] loratadine  10 mg Per Tube BH-q7a  . metoprolol tartrate  50 mg Per Tube BID  . miconazole  1 application Topical BID  . nystatin  5 mL Oral QID  . ondansetron  8 mg Oral Q12H  . [START ON 05/08/2014] polyethylene glycol  17 g Per Tube Daily  . [START ON 05/08/2014] predniSONE  10 mg Per Tube Q breakfast  . sodium chloride  3 mL Intravenous Q12H   Continuous Infusions: . sodium chloride 75 mL/hr at 05/07/14 0455  . feeding supplement (JEVITY 1.2 CAL) 1,000 mL (05/07/14 0057)   Time spent: 35 minutes  Phillips Climes , MD [facility] Pager [phone] If 7 PM - 7 AM, please contact night-coverage at [email or web address], password [facility] 05/07/2014, 10:41 AM  LOS: 5 days

## 2014-05-07 NOTE — Progress Notes (Signed)
Subjective: Patient continues to be weak.  Feedings have been initiated.  Myasthenia panel pending.    Objective: Current vital signs: BP 158/74 mmHg  Pulse 72  Temp(Src) 97.8 F (36.6 C) (Oral)  Resp 18  Ht 5' 9"  (1.753 m)  Wt 69.31 kg (152 lb 12.8 oz)  BMI 22.55 kg/m2  SpO2 97% Vital signs in last 24 hours: Temp:  [97.8 F (36.6 C)-98.5 F (36.9 C)] 97.8 F (36.6 C) (01/23 1124) Pulse Rate:  [59-118] 72 (01/23 1124) Resp:  [17-20] 18 (01/23 1124) BP: (102-170)/(56-93) 158/74 mmHg (01/23 1124) SpO2:  [97 %-100 %] 97 % (01/23 1124) Weight:  [69.31 kg (152 lb 12.8 oz)] 69.31 kg (152 lb 12.8 oz) (01/23 0336)  Intake/Output from previous day: 01/22 0701 - 01/23 0700 In: 1045 [I.V.:975; NG/GT:70] Out: 1225 [Urine:1225] Intake/Output this shift:   Nutritional status: Diet NPO time specified Except for: Other (See Comments)  Neurologic Exam: Mental Status: Alert, oriented, thought content appropriate. Speech soft but fluent. Able to follow 3 step commands without difficulty. Cranial Nerves: II: Discs flat bilaterally; Visual fields grossly normal, pupils equal, round, reactive to light and accommodation III,IV, VI: ptosis not present, extra-ocular motions intact bilaterally V,VII: facial bone structure asymmetry with smile symmetric, facial light touch sensation normal bilaterally VIII: hearing normal bilaterally IX,X: gag reflex present XI: bilateral shoulder shrug XII: midline tongue extension Motor: Patient exhibits generalized weakness but when arms lifted together has a drift of the RUE without pronation. Lower extremity testing reveals a positive Hoovers.Proximal LE strength worse with patient having difficulty lifting the leg off the bed.  Sensory: Pinprick and light touch intact throughout, bilaterally Deep Tendon Reflexes: 1+ and symmetric throughout Plantars: Right: downgoingLeft: downgoing  Lab Results: Basic Metabolic  Panel:  Recent Labs Lab 05/02/14 1316 05/02/14 1329 05/03/14 0559 05/04/14 0615 05/05/14 0530 05/07/14 0306  NA 135 134* 136 135 135 136  K 4.8 4.6 3.8 3.9 4.2 3.7  CL 94* 93* 96 95* 96 103  CO2 30  --  31 31 31 29   GLUCOSE 188* 188* 143* 152* 141* 139*  BUN 24* 29* 18 15 20 21   CREATININE 0.80 0.80 0.76 0.69 0.90 0.67  CALCIUM 9.1  --  9.0 9.0 9.0 8.4    Liver Function Tests:  Recent Labs Lab 05/02/14 1316  AST 25  ALT 14  ALKPHOS 72  BILITOT 0.5  PROT 6.2  ALBUMIN 2.6*   No results for input(s): LIPASE, AMYLASE in the last 168 hours. No results for input(s): AMMONIA in the last 168 hours.  CBC:  Recent Labs Lab 05/02/14 1316 05/02/14 1329 05/03/14 0559 05/04/14 0615 05/05/14 0530 05/07/14 0306  WBC 14.5*  --  13.1* 16.8* 16.0* 17.2*  NEUTROABS 12.1*  --   --   --   --   --   HGB 12.5* 13.9 11.5* 12.3* 12.1* 10.1*  HCT 37.1* 41.0 34.3* 36.4* 36.8* 31.0*  MCV 82.8  --  81.9 82.2 85.0 86.6  PLT 303  --  306 313 280 221    Cardiac Enzymes: No results for input(s): CKTOTAL, CKMB, CKMBINDEX, TROPONINI in the last 168 hours.  Lipid Panel:  Recent Labs Lab 05/03/14 0559  CHOL 111  TRIG 111  HDL 38*  CHOLHDL 2.9  VLDL 22  LDLCALC 51    CBG:  Recent Labs Lab 05/06/14 0753 05/06/14 1216 05/06/14 2052 05/07/14 0738 05/07/14 1126  GLUCAP 126* 168* 98 132* 116*    Microbiology: Results for orders placed or performed  during the hospital encounter of 04/12/14  Blood Culture (routine x 2)     Status: None   Collection Time: 04/12/14  6:34 PM  Result Value Ref Range Status   Specimen Description BLOOD PAC  Final   Special Requests BOTTLES DRAWN AEROBIC AND ANAEROBIC 5CC  Final   Culture   Final    NO GROWTH 5 DAYS Performed at [facility]    Report Status 04/19/2014 FINAL  Final  Blood Culture (routine x 2)     Status: None   Collection Time: 04/12/14  7:24 PM  Result Value Ref Range Status   Specimen Description BLOOD RIGHT  ANTECUBITAL  Final   Special Requests BOTTLES DRAWN AEROBIC AND ANAEROBIC 5CC  Final   Culture   Final    NO GROWTH 5 DAYS Performed at [facility]    Report Status 04/19/2014 FINAL  Final  Urine culture     Status: None   Collection Time: 04/12/14  9:17 PM  Result Value Ref Range Status   Specimen Description URINE, CLEAN CATCH  Final   Special Requests NONE  Final   Colony Count NO GROWTH Performed at [facility]   Final   Culture NO GROWTH Performed at [facility]   Final   Report Status 04/13/2014 FINAL  Final  Rapid strep screen     Status: None   Collection Time: 04/12/14  9:23 PM  Result Value Ref Range Status   Streptococcus, Group A Screen (Direct) NEGATIVE NEGATIVE Final    Comment: (NOTE) A Rapid Antigen test may result negative if the antigen level in the sample is below the detection level of this test. The FDA has not cleared this test as a stand-alone test therefore the rapid antigen negative result has reflexed to a Group A Strep culture.   Culture, Group A Strep     Status: None   Collection Time: 04/12/14  9:23 PM  Result Value Ref Range Status   Specimen Description THROAT  Final   Special Requests NONE  Final   Culture   Final    No Beta Hemolytic Streptococci Isolated Performed at [facility]    Report Status 04/14/2014 FINAL  Final  Respiratory virus panel (routine influenza)     Status: None   Collection Time: 04/13/14  2:14 AM  Result Value Ref Range Status   Source - [facility] NASAL WASHINGS  Corrected    Comment: CORRECTED ON 12/30 AT 1834: PREVIOUSLY REPORTED AS NASAL WASHINGS   Respiratory Syncytial Virus A NOT DETECTED  Final   Respiratory Syncytial Virus B NOT DETECTED  Final   Influenza A NOT DETECTED  Final   Influenza B NOT DETECTED  Final   Parainfluenza 1 NOT DETECTED  Final   Parainfluenza 2 NOT DETECTED  Final   Parainfluenza 3 NOT DETECTED  Final   Metapneumovirus NOT DETECTED  Final   Rhinovirus  NOT DETECTED  Final   Adenovirus NOT DETECTED  Final   Influenza A H1 NOT DETECTED  Final   Influenza A H3 NOT DETECTED  Final    Comment: (NOTE)       Normal Reference Range for each Analyte: NOT DETECTED Testing performed using the Luminex xTAG Respiratory Viral Panel test kit. The analytical performance characteristics of this assay have been determined by [facility].  The modifications have not been cleared or approved by the FDA. This assay has been validated pursuant to the CLIA regulations and is used  for clinical purposes. Performed at [facility]     Coagulation Studies: No results for input(s): LABPROT, INR in the last 72 hours.  Imaging: Dg Abd Portable 1v  05/06/2014   CLINICAL DATA:  Nasogastric tube placement  EXAM: PORTABLE ABDOMEN - 1 VIEW  COMPARISON:  None.  FINDINGS: Nasogastric tube is identified distal tip in the proximal stomach. The visualized bowel gas pattern is normal. There is minimal left pleural effusion.  IMPRESSION: Nasogastric tube noted with distal tip in the proximal stomach.   Electronically Signed   By: Abelardo Diesel M.D.   On: 05/06/2014 19:09   Dg Swallowing Func-speech Pathology  05/06/2014   Lorre Nick, CCC-SLP     05/06/2014 11:27 AM  Objective Swallowing Evaluation: Modified Barium Swallowing Study   Patient Details  Name: Erik Ramirez MRN: [id number] Date of Birth: August 09, 1925  Today's Date: 05/06/2014 Time: SLP Start Time (ACUTE ONLY): 1045-SLP Stop Time (ACUTE  ONLY): 1105 SLP Time Calculation (min) (ACUTE ONLY): 20 min  Past Medical History:  Past Medical History  Diagnosis Date  . Hypertension   . Hyperlipemia   . Anxiety   . Lymphadenopathy, abdominal   . Urethral stricture   . Anemia   . BPPV (benign paroxysmal positional vertigo)   . Lymphoma   . Diabetes mellitus without complication   . Obstructive sleep apnea     uses cpap at home  . Sleep apnea    Past Surgical History:  Past Surgical History  Procedure Laterality Date   . Back surgery    . Prostate surgery    . Tonsillectomy     HPI:  HPI: 81 year old male with h/o HTN, anxiety, lymphoma, sleep  apnea, DM, anemia seen for OP MBS due to c/o dysphagia x 1 week.  Patient and son providing history which includes also within the  week, new onset right arm weakness and numbness, right leg  numbness, significant generalized weakness, severe vocal quality  changes, falls. SLP confirms severe dysphonia (almost aphonic) as  well as right sided facial droop.   No Data Recorded  Assessment / Plan / Recommendation CHL IP CLINICAL IMPRESSIONS 05/06/2014  Dysphagia Diagnosis Moderate pharyngeal phase dysphagia;Severe  pharyngeal phase dysphagia  Clinical impression Patient continues to present with a  moderate-severe primarily pharyngeal dysphagia characterized by  base of tongue, laryngeal, and pharyngeal weakness, as well as  reduced UES relaxation. This results in poor pharyngeal clearance  of all tested consistencies, as well as decreased airway  protection of thin liquids (via tsp) and nectar thick liquids  (via cup sip). No penetration was noted on puree consistency.  Significant post-swallow residual noted pharyngeally across  consistencies, which patient continues to sense, responding with  multiple dry swallows or bringing residual back to oral cavity to  be suctioned. Various head postures assessed and were again  unsuccessful to reduce pharyngeal residuals. Overall, risk of  aspiration continues to be high, given severity of deficits, and  risk of poor nutrition is also an issue, given the minimal amount  of material which successfully passes into the esophagus.   Recommend further esophageal work up to assess appropriateness  for management of UES issue.  Pt appears safe for small  individual boluses of nectar thick liquid via teaspoon to  mitigate disuse atrophy, however, primary nutrition, hydration,  and medication is recommended to be NPO at this time.      CHL IP TREATMENT  RECOMMENDATION 05/06/2014  Treatment Plan Recommendations Therapy as outlined  in treatment  plan below     CHL IP DIET RECOMMENDATION 05/06/2014  Diet Recommendations NPO  Liquid Administration via Spoon  Medication Administration Via alternative means  Compensations (None)  Postural Changes and/or Swallow Maneuvers (None)     CHL IP OTHER RECOMMENDATIONS 05/06/2014  Recommended Consults Consider GI evaluation;Consider ENT  evaluation  Oral Care Recommendations Oral care Q4 per protocol  Other Recommendations Have oral suction available     CHL IP FOLLOW UP RECOMMENDATIONS 05/06/2014  Follow up Recommendations Home health SLP;Outpatient SLP     CHL IP FREQUENCY AND DURATION 05/06/2014  Speech Therapy Frequency (ACUTE ONLY) min 2x/week  Treatment Duration 2 weeks     Pertinent Vitals/Pain None reported    SLP Swallow Goals No flowsheet data found.  No flowsheet data found.    CHL IP REASON FOR REFERRAL 05/06/2014  Reason for Referral Objectively evaluate swallowing function     CHL IP ORAL PHASE 05/06/2014  Lips (None)  Tongue (None)  Mucous membranes (None)  Nutritional status (None)  Other (None)  Oxygen therapy (None)  Oral Phase WFL  Oral - Pudding Teaspoon (None)  Oral - Pudding Cup (None)  Oral - Honey Teaspoon (None)  Oral - Honey Cup (None)  Oral - Honey Syringe (None)  Oral - Nectar Teaspoon (None)  Oral - Nectar Cup (None)  Oral - Nectar Straw (None)  Oral - Nectar Syringe (None)  Oral - Ice Chips (None)  Oral - Thin Teaspoon (None)  Oral - Thin Cup (None)  Oral - Thin Straw (None)  Oral - Thin Syringe (None)  Oral - Puree (None)  Oral - Mechanical Soft (None)  Oral - Regular (None)  Oral - Multi-consistency (None)  Oral - Pill (None)  Oral Phase - Comment (None)      CHL IP PHARYNGEAL PHASE 05/06/2014  Pharyngeal Phase Impaired  Pharyngeal - Pudding Teaspoon (None)  Penetration/Aspiration details (pudding teaspoon) (None)  Pharyngeal - Pudding Cup (None)  Penetration/Aspiration details (pudding cup) (None)   Pharyngeal - Honey Teaspoon (None)  Penetration/Aspiration details (honey teaspoon) (None)  Pharyngeal - Honey Cup (None)  Penetration/Aspiration details (honey cup) (None)  Pharyngeal - Honey Syringe (None)  Penetration/Aspiration details (honey syringe) (None)  Pharyngeal - Nectar Teaspoon Delayed swallow initiation;Reduced  pharyngeal peristalsis;Reduced epiglottic inversion;Reduced  anterior laryngeal mobility;Reduced laryngeal elevation;Reduced  airway/laryngeal closure;Reduced tongue base  retraction;Pharyngeal residue - valleculae;Pharyngeal residue -  pyriform sinuses;Lateral channel residue;Compensatory strategies  attempted (Comment);Premature spillage to valleculae  Penetration/Aspiration details (nectar teaspoon) (None)  Pharyngeal - Nectar Cup Delayed swallow initiation;Premature  spillage to pyriform sinuses;Reduced pharyngeal  peristalsis;Reduced epiglottic inversion;Reduced anterior  laryngeal mobility;Reduced laryngeal elevation;Reduced  airway/laryngeal closure;Reduced tongue base  retraction;Penetration/Aspiration during swallow;Pharyngeal  residue - valleculae;Pharyngeal residue - pyriform  sinuses;Lateral channel residue;Compensatory strategies attempted  (Comment);Premature spillage to valleculae  Penetration/Aspiration details (nectar cup) Material enters  airway, remains ABOVE vocal cords and not ejected out  Pharyngeal - Nectar Straw (None)  Penetration/Aspiration details (nectar straw) (None)  Pharyngeal - Nectar Syringe (None)  Penetration/Aspiration details (nectar syringe) (None)  Pharyngeal - Ice Chips (None)  Penetration/Aspiration details (ice chips) (None)  Pharyngeal - Thin Teaspoon Delayed swallow initiation;Premature  spillage to valleculae;Premature spillage to pyriform  sinuses;Reduced pharyngeal peristalsis;Reduced epiglottic  inversion;Reduced tongue base retraction;Reduced anterior  laryngeal mobility;Penetration/Aspiration during swallow;Reduced  laryngeal elevation;Reduced  airway/laryngeal closure;Trace  aspiration;Pharyngeal residue - valleculae;Pharyngeal residue -  pyriform sinuses;Pharyngeal residue - posterior pharnyx;Lateral  channel residue;Compensatory strategies attempted (Comment)  Penetration/Aspiration details (thin teaspoon) Material enters  airway, passes BELOW  cords without attempt by patient to eject  out (silent aspiration)  Pharyngeal - Thin Cup NT  Penetration/Aspiration details (thin cup) (None)  Pharyngeal - Thin Straw (None)  Penetration/Aspiration details (thin straw) (None)  Pharyngeal - Thin Syringe (None)  Penetration/Aspiration details (thin syringe') (None)  Pharyngeal - Puree Delayed swallow initiation;Premature spillage  to pyriform sinuses;Reduced pharyngeal peristalsis;Reduced  epiglottic inversion;Reduced anterior laryngeal mobility;Reduced  laryngeal elevation;Reduced airway/laryngeal closure;Reduced  tongue base retraction;Pharyngeal residue - valleculae;Pharyngeal  residue - pyriform sinuses;Lateral channel residue;Compensatory  strategies attempted (Comment);Premature spillage to  valleculae;Pharyngeal residue - posterior pharnyx  Penetration/Aspiration details (puree) Material does not enter  airway  Pharyngeal - Mechanical Soft NT  Penetration/Aspiration details (mechanical soft) (None)  Pharyngeal - Regular (None)  Penetration/Aspiration details (regular) (None)  Pharyngeal - Multi-consistency (None)  Penetration/Aspiration details (multi-consistency) (None)  Pharyngeal - Pill (None)  Penetration/Aspiration details (pill) (None)  Pharyngeal Comment (None)     CHL IP CERVICAL ESOPHAGEAL PHASE 05/06/2014  Cervical Esophageal Phase Impaired  Pudding Teaspoon (None)  Pudding Cup (None)  Honey Teaspoon (None)  Honey Cup (None)  Honey Syringe (None)  Nectar Teaspoon (None)  Nectar Cup (None)  Nectar Straw (None)  Nectar Syringe (None)  Thin Teaspoon (None)  Thin Cup (None)  Thin Straw (None)  Thin Syringe (None)  Cervical Esophageal Comment (No Data)     CHL IP GO 05/02/2014  Functional Assessment Tool Used skilled clinical judgement  Functional Limitations Swallowing  Swallow Current Status (H2197) CL  Swallow Goal Status (J8832) CL  Swallow Discharge Status (P4982) CL  Motor Speech Current Status (M4158) (None)  Motor Speech Goal Status (X0940) (None)  Motor Speech Goal Status (H6808) (None)  Spoken Language Comprehension Current Status (U1103) (None)  Spoken Language Comprehension Goal Status (P5945) (None)  Spoken Language Comprehension Discharge Status (O5929) (None)  Spoken Language Expression Current Status (W4462) (None)  Spoken Language Expression Goal Status (M6381) (None)  Spoken Language Expression Discharge Status (R7116) (None)  Attention Current Status (F7903) (None)  Attention Goal Status (Y3338) (None)  Attention Discharge Status (V2919) (None)  Memory Current Status (T6606) (None)  Memory Goal Status (Y0459) (None)  Memory Discharge Status (X7741) (None)  Voice Current Status (S2395) (None)  Voice Goal Status (V2023) (None)  Voice Discharge Status (X4356) (None)  Other Speech-Language Pathology Functional Limitation (Y6168)  (None)  Other Speech-Language Pathology Functional Limitation Goal Status  (H7290) (None)  Other Speech-Language Pathology Functional Limitation Discharge  Status [phone]) (None)          Celia B. Quentin Ore [facility], CCC-SLP [phone]  Shonna Chock 05/06/2014, 11:26 AM     Medications:  I have reviewed the patient's current medications. Scheduled: . [START ON 05/08/2014] allopurinol  300 mg Per Tube Daily  . antiseptic oral rinse  7 mL Mouth Rinse q12n4p  . [START ON 05/08/2014] aspirin  325 mg Per Tube Daily  . atorvastatin  10 mg Per Tube q1800  . chlorhexidine  15 mL Mouth Rinse BID  . diltiazem  30 mg Per Tube 4 times per day  . docusate  100 mg Per Tube BID  . enoxaparin (LOVENOX) injection  40 mg Subcutaneous Q24H  . IMMUNE GLOBULIN 10% (HUMAN) IV - For Fluid Restriction Only  400 mg/kg Intravenous Q24H   . Influenza vac split quadrivalent PF  0.5 mL Intramuscular Tomorrow-1000  . insulin aspart  0-15 Units Subcutaneous Q6H  . [START ON 05/08/2014] loratadine  10 mg Per Tube BH-q7a  . metoprolol tartrate  50 mg Per Tube BID  .  miconazole  1 application Topical BID  . nystatin  5 mL Oral QID  . ondansetron  8 mg Oral Q12H  . [START ON 05/08/2014] polyethylene glycol  17 g Per Tube Daily  . [START ON 05/08/2014] predniSONE  10 mg Per Tube Q breakfast  . sodium chloride  3 mL Intravenous Q12H    Assessment/Plan: Patient continues to be weak and per neurological examination appears to be becoming weaker.  There does not appear to be an intracerebral or cervical process at this time but can not rule out a paraneoplastic syndrome whether this be a myasthenic state or a lymphoma related neuropathy, etc.  Labs pending but with worsening strength would initiate treatment which would be the same for all of these options.   Recommendations; 1.  IVIg for 5 days-455m/kg 2.  Will follow up myasthenia panel 3.  Will continue to follow with you  Case discussed with Dr. EWaldron Labs  LOS: 5 days   LAlexis Goodell MD [facility] [phone]/23/2016  11:53 AM

## 2014-05-07 NOTE — Progress Notes (Signed)
DAILY PROGRESS NOTE  Subjective:  Sleeping - appears comfortable. Converted to sinus overnight and maintaining in the 70's.  On tube feeds. IM discussion noted with plans for short trial of feeds and probable palliative care consult today.  Objective:  Temp:  [98 F (36.7 C)-98.5 F (36.9 C)] 98.2 F (36.8 C) (01/23 0739) Pulse Rate:  [59-118] 86 (01/23 0858) Resp:  [17-20] 17 (01/23 0739) BP: (102-170)/(56-93) 170/79 mmHg (01/23 0858) SpO2:  [97 %-100 %] 100 % (01/23 0739) Weight:  [152 lb 12.8 oz (69.31 kg)] 152 lb 12.8 oz (69.31 kg) (01/23 0336) Weight change: 2 lb (0.907 kg)  Intake/Output from previous day: 01/22 0701 - 01/23 0700 In: 1045 [I.V.:975; NG/GT:70] Out: 1225 [Urine:1225]  Intake/Output from this shift:    Medications: Current Facility-Administered Medications  Medication Dose Route Frequency Provider Last Rate Last Dose  . 0.9 %  sodium chloride infusion   Intravenous Continuous Nishant Dhungel, MD 75 mL/hr at 05/07/14 0455    . acetaminophen (TYLENOL) tablet 650 mg  650 mg Oral Q6H PRN Nishant Dhungel, MD   650 mg at 05/04/14 2351   Or  . acetaminophen (TYLENOL) suppository 650 mg  650 mg Rectal Q6H PRN Nishant Dhungel, MD      . allopurinol (ZYLOPRIM) tablet 300 mg  300 mg Oral Daily Nishant Dhungel, MD   300 mg at 05/07/14 0855  . antiseptic oral rinse (CPC / CETYLPYRIDINIUM CHLORIDE 0.05%) solution 7 mL  7 mL Mouth Rinse q12n4p Nishant Dhungel, MD   7 mL at 05/05/14 1600  . aspirin tablet 325 mg  325 mg Oral Daily Nishant Dhungel, MD   325 mg at 05/07/14 0855  . atorvastatin (LIPITOR) tablet 10 mg  10 mg Oral q1800 Nishant Dhungel, MD   10 mg at 05/05/14 1832  . chlorhexidine (PERIDEX) 0.12 % solution 15 mL  15 mL Mouth Rinse BID Nishant Dhungel, MD   15 mL at 05/07/14 0854  . clorazepate (TRANXENE) tablet 7.5 mg  7.5 mg Oral BID PRN Nishant Dhungel, MD   7.5 mg at 05/03/14 1204  . diltiazem (CARDIZEM CD) 24 hr capsule 120 mg  120 mg Oral Daily  Nishant Dhungel, MD   120 mg at 05/06/14 0820  . diltiazem (CARDIZEM) 100 mg in dextrose 5 % 100 mL (1 mg/mL) infusion  5-15 mg/hr Intravenous Titrated Dianne Dun, NP   Stopped at 05/06/14 1953  . docusate (COLACE) 50 MG/5ML liquid 100 mg  100 mg Oral BID Phillips Climes, MD   100 mg at 05/07/14 0908  . enoxaparin (LOVENOX) injection 40 mg  40 mg Subcutaneous Q24H Nishant Dhungel, MD   40 mg at 05/06/14 1930  . feeding supplement (ENSURE COMPLETE) (ENSURE COMPLETE) liquid 237 mL  237 mL Oral BID BM Baird Lyons, RD   237 mL at 05/06/14 1149  . feeding supplement (JEVITY 1.2 CAL) liquid 1,000 mL  1,000 mL Per Tube Continuous Ritta Slot, NP 10 mL/hr at 05/07/14 0057 1,000 mL at 05/07/14 0057  . hydrALAZINE (APRESOLINE) injection 10 mg  10 mg Intravenous Q4H PRN Caren Griffins, MD   10 mg at 05/05/14 2112  . Immune Globulin 5% SOLN 30 g  400 mg/kg Intravenous Q24 Hr x 5 Alexis Goodell, MD      . Influenza vac split quadrivalent PF (FLUARIX) injection 0.5 mL  0.5 mL Intramuscular Tomorrow-1000 Nishant Dhungel, MD   0.5 mL at 05/04/14 1411  . insulin aspart (novoLOG) injection 0-15 Units  0-15 Units Subcutaneous TID WC Nishant Dhungel, MD   2 Units at 05/07/14 0909  . loratadine (CLARITIN) tablet 10 mg  10 mg Oral BH-q7a Nishant Dhungel, MD   10 mg at 05/07/14 0856  . metoprolol (LOPRESSOR) tablet 50 mg  50 mg Per Tube BID Phillips Climes, MD   50 mg at 05/07/14 0858  . miconazole (MICOTIN) 2 % cream 1 application  1 application Topical BID Nishant Dhungel, MD   1 application at 50/27/74 0855  . nystatin (MYCOSTATIN) 100000 UNIT/ML suspension 500,000 Units  5 mL Oral QID Nishant Dhungel, MD   500,000 Units at 05/07/14 0053  . ondansetron (ZOFRAN) tablet 4 mg  4 mg Oral Q6H PRN Nishant Dhungel, MD       Or  . ondansetron (ZOFRAN) injection 4 mg  4 mg Intravenous Q6H PRN Nishant Dhungel, MD      . ondansetron (ZOFRAN) tablet 8 mg  8 mg Oral Q12H Nishant Dhungel, MD   8 mg at  05/07/14 0856  . polyethylene glycol (MIRALAX / GLYCOLAX) packet 17 g  17 g Oral Daily Nishant Dhungel, MD   17 g at 05/07/14 0908  . predniSONE (DELTASONE) tablet 10 mg  10 mg Oral Q breakfast Nishant Dhungel, MD   10 mg at 05/07/14 0855  . RESOURCE THICKENUP CLEAR   Oral PRN Baird Lyons, RD      . sodium chloride 0.9 % injection 3 mL  3 mL Intravenous Q12H Nishant Dhungel, MD   3 mL at 05/07/14 0912  . sucralfate (CARAFATE) 1 GM/10ML suspension 1 g  1 g Oral TID WC & HS Nishant Dhungel, MD   1 g at 05/07/14 0912  . traMADol (ULTRAM) tablet 100 mg  100 mg Oral Q6H PRN Dionne Milo, NP   100 mg at 05/05/14 1639    Physical Exam: General appearance: asleep, lightly arousable on exam, NG in place Heart: regular rate and rhythm Extremities: extremities normal, atraumatic, no cyanosis or edema  Lab Results: Results for orders placed or performed during the hospital encounter of 05/02/14 (from the past 48 hour(s))  Glucose, capillary     Status: Abnormal   Collection Time: 05/05/14 11:24 AM  Result Value Ref Range   Glucose-Capillary 131 (H) 70 - 99 mg/dL   Comment 1 Notify RN    Comment 2 Documented in Chart   Glucose, capillary     Status: Abnormal   Collection Time: 05/05/14  4:40 PM  Result Value Ref Range   Glucose-Capillary 129 (H) 70 - 99 mg/dL   Comment 1 Notify RN    Comment 2 Documented in Chart   Glucose, capillary     Status: Abnormal   Collection Time: 05/05/14  9:22 PM  Result Value Ref Range   Glucose-Capillary 140 (H) 70 - 99 mg/dL   Comment 1 Documented in Chart    Comment 2 Notify RN   Glucose, capillary     Status: Abnormal   Collection Time: 05/06/14  7:53 AM  Result Value Ref Range   Glucose-Capillary 126 (H) 70 - 99 mg/dL  Glucose, capillary     Status: Abnormal   Collection Time: 05/06/14 12:16 PM  Result Value Ref Range   Glucose-Capillary 168 (H) 70 - 99 mg/dL  Glucose, capillary     Status: None   Collection Time: 05/06/14  8:52 PM  Result  Value Ref Range   Glucose-Capillary 98 70 - 99 mg/dL   Comment 1 Notify RN   CBC  Status: Abnormal   Collection Time: 05/07/14  3:06 AM  Result Value Ref Range   WBC 17.2 (H) 4.0 - 10.5 K/uL   RBC 3.58 (L) 4.22 - 5.81 MIL/uL   Hemoglobin 10.1 (L) 13.0 - 17.0 g/dL   HCT 31.0 (L) 39.0 - 52.0 %   MCV 86.6 78.0 - 100.0 fL   MCH 28.2 26.0 - 34.0 pg   MCHC 32.6 30.0 - 36.0 g/dL   RDW 17.6 (H) 11.5 - 15.5 %   Platelets 221 150 - 400 K/uL  Basic metabolic panel     Status: Abnormal   Collection Time: 05/07/14  3:06 AM  Result Value Ref Range   Sodium 136 135 - 145 mmol/L   Potassium 3.7 3.5 - 5.1 mmol/L   Chloride 103 96 - 112 mmol/L   CO2 29 19 - 32 mmol/L   Glucose, Bld 139 (H) 70 - 99 mg/dL   BUN 21 6 - 23 mg/dL   Creatinine, Ser 0.67 0.50 - 1.35 mg/dL   Calcium 8.4 8.4 - 10.5 mg/dL   GFR calc non Af Amer 85 (L) >90 mL/min   GFR calc Af Amer >90 >90 mL/min    Comment: (NOTE) The eGFR has been calculated using the CKD EPI equation. This calculation has not been validated in all clinical situations. eGFR's persistently <90 mL/min signify possible Chronic Kidney Disease.    Anion gap 4 (L) 5 - 15  Glucose, capillary     Status: Abnormal   Collection Time: 05/07/14  7:38 AM  Result Value Ref Range   Glucose-Capillary 132 (H) 70 - 99 mg/dL    Imaging: Dg Abd Portable 1v  05/06/2014   CLINICAL DATA:  Nasogastric tube placement  EXAM: PORTABLE ABDOMEN - 1 VIEW  COMPARISON:  None.  FINDINGS: Nasogastric tube is identified distal tip in the proximal stomach. The visualized bowel gas pattern is normal. There is minimal left pleural effusion.  IMPRESSION: Nasogastric tube noted with distal tip in the proximal stomach.   Electronically Signed   By: Abelardo Diesel M.D.   On: 05/06/2014 19:09   Dg Swallowing Func-speech Pathology  05/06/2014   Lorre Nick, CCC-SLP     05/06/2014 11:27 AM  Objective Swallowing Evaluation: Modified Barium Swallowing Study   Patient Details  Name: Erik Ramirez MRN: [id number] Date of Birth: 10/23/1925  Today's Date: 05/06/2014 Time: SLP Start Time (ACUTE ONLY): 1045-SLP Stop Time (ACUTE  ONLY): 1105 SLP Time Calculation (min) (ACUTE ONLY): 20 min  Past Medical History:  Past Medical History  Diagnosis Date  . Hypertension   . Hyperlipemia   . Anxiety   . Lymphadenopathy, abdominal   . Urethral stricture   . Anemia   . BPPV (benign paroxysmal positional vertigo)   . Lymphoma   . Diabetes mellitus without complication   . Obstructive sleep apnea     uses cpap at home  . Sleep apnea    Past Surgical History:  Past Surgical History  Procedure Laterality Date  . Back surgery    . Prostate surgery    . Tonsillectomy     HPI:  HPI: 81 year old male with h/o HTN, anxiety, lymphoma, sleep  apnea, DM, anemia seen for OP MBS due to c/o dysphagia x 1 week.  Patient and son providing history which includes also within the  week, new onset right arm weakness and numbness, right leg  numbness, significant generalized weakness, severe vocal quality  changes, falls. SLP confirms severe dysphonia (almost  aphonic) as  well as right sided facial droop.   No Data Recorded  Assessment / Plan / Recommendation CHL IP CLINICAL IMPRESSIONS 05/06/2014  Dysphagia Diagnosis Moderate pharyngeal phase dysphagia;Severe  pharyngeal phase dysphagia  Clinical impression Patient continues to present with a  moderate-severe primarily pharyngeal dysphagia characterized by  base of tongue, laryngeal, and pharyngeal weakness, as well as  reduced UES relaxation. This results in poor pharyngeal clearance  of all tested consistencies, as well as decreased airway  protection of thin liquids (via tsp) and nectar thick liquids  (via cup sip). No penetration was noted on puree consistency.  Significant post-swallow residual noted pharyngeally across  consistencies, which patient continues to sense, responding with  multiple dry swallows or bringing residual back to oral cavity to  be suctioned. Various head  postures assessed and were again  unsuccessful to reduce pharyngeal residuals. Overall, risk of  aspiration continues to be high, given severity of deficits, and  risk of poor nutrition is also an issue, given the minimal amount  of material which successfully passes into the esophagus.   Recommend further esophageal work up to assess appropriateness  for management of UES issue.  Pt appears safe for small  individual boluses of nectar thick liquid via teaspoon to  mitigate disuse atrophy, however, primary nutrition, hydration,  and medication is recommended to be NPO at this time.      CHL IP TREATMENT RECOMMENDATION 05/06/2014  Treatment Plan Recommendations Therapy as outlined in treatment  plan below     CHL IP DIET RECOMMENDATION 05/06/2014  Diet Recommendations NPO  Liquid Administration via Spoon  Medication Administration Via alternative means  Compensations (None)  Postural Changes and/or Swallow Maneuvers (None)     CHL IP OTHER RECOMMENDATIONS 05/06/2014  Recommended Consults Consider GI evaluation;Consider ENT  evaluation  Oral Care Recommendations Oral care Q4 per protocol  Other Recommendations Have oral suction available     CHL IP FOLLOW UP RECOMMENDATIONS 05/06/2014  Follow up Recommendations Home health SLP;Outpatient SLP     CHL IP FREQUENCY AND DURATION 05/06/2014  Speech Therapy Frequency (ACUTE ONLY) min 2x/week  Treatment Duration 2 weeks     Pertinent Vitals/Pain None reported    SLP Swallow Goals No flowsheet data found.  No flowsheet data found.    CHL IP REASON FOR REFERRAL 05/06/2014  Reason for Referral Objectively evaluate swallowing function     CHL IP ORAL PHASE 05/06/2014  Lips (None)  Tongue (None)  Mucous membranes (None)  Nutritional status (None)  Other (None)  Oxygen therapy (None)  Oral Phase WFL  Oral - Pudding Teaspoon (None)  Oral - Pudding Cup (None)  Oral - Honey Teaspoon (None)  Oral - Honey Cup (None)  Oral - Honey Syringe (None)  Oral - Nectar Teaspoon (None)  Oral - Nectar  Cup (None)  Oral - Nectar Straw (None)  Oral - Nectar Syringe (None)  Oral - Ice Chips (None)  Oral - Thin Teaspoon (None)  Oral - Thin Cup (None)  Oral - Thin Straw (None)  Oral - Thin Syringe (None)  Oral - Puree (None)  Oral - Mechanical Soft (None)  Oral - Regular (None)  Oral - Multi-consistency (None)  Oral - Pill (None)  Oral Phase - Comment (None)      CHL IP PHARYNGEAL PHASE 05/06/2014  Pharyngeal Phase Impaired  Pharyngeal - Pudding Teaspoon (None)  Penetration/Aspiration details (pudding teaspoon) (None)  Pharyngeal - Pudding Cup (None)  Penetration/Aspiration details (pudding cup) (None)  Pharyngeal -  Honey Teaspoon (None)  Penetration/Aspiration details (honey teaspoon) (None)  Pharyngeal - Honey Cup (None)  Penetration/Aspiration details (honey cup) (None)  Pharyngeal - Honey Syringe (None)  Penetration/Aspiration details (honey syringe) (None)  Pharyngeal - Nectar Teaspoon Delayed swallow initiation;Reduced  pharyngeal peristalsis;Reduced epiglottic inversion;Reduced  anterior laryngeal mobility;Reduced laryngeal elevation;Reduced  airway/laryngeal closure;Reduced tongue base  retraction;Pharyngeal residue - valleculae;Pharyngeal residue -  pyriform sinuses;Lateral channel residue;Compensatory strategies  attempted (Comment);Premature spillage to valleculae  Penetration/Aspiration details (nectar teaspoon) (None)  Pharyngeal - Nectar Cup Delayed swallow initiation;Premature  spillage to pyriform sinuses;Reduced pharyngeal  peristalsis;Reduced epiglottic inversion;Reduced anterior  laryngeal mobility;Reduced laryngeal elevation;Reduced  airway/laryngeal closure;Reduced tongue base  retraction;Penetration/Aspiration during swallow;Pharyngeal  residue - valleculae;Pharyngeal residue - pyriform  sinuses;Lateral channel residue;Compensatory strategies attempted  (Comment);Premature spillage to valleculae  Penetration/Aspiration details (nectar cup) Material enters  airway, remains ABOVE vocal cords and not  ejected out  Pharyngeal - Nectar Straw (None)  Penetration/Aspiration details (nectar straw) (None)  Pharyngeal - Nectar Syringe (None)  Penetration/Aspiration details (nectar syringe) (None)  Pharyngeal - Ice Chips (None)  Penetration/Aspiration details (ice chips) (None)  Pharyngeal - Thin Teaspoon Delayed swallow initiation;Premature  spillage to valleculae;Premature spillage to pyriform  sinuses;Reduced pharyngeal peristalsis;Reduced epiglottic  inversion;Reduced tongue base retraction;Reduced anterior  laryngeal mobility;Penetration/Aspiration during swallow;Reduced  laryngeal elevation;Reduced airway/laryngeal closure;Trace  aspiration;Pharyngeal residue - valleculae;Pharyngeal residue -  pyriform sinuses;Pharyngeal residue - posterior pharnyx;Lateral  channel residue;Compensatory strategies attempted (Comment)  Penetration/Aspiration details (thin teaspoon) Material enters  airway, passes BELOW cords without attempt by patient to eject  out (silent aspiration)  Pharyngeal - Thin Cup NT  Penetration/Aspiration details (thin cup) (None)  Pharyngeal - Thin Straw (None)  Penetration/Aspiration details (thin straw) (None)  Pharyngeal - Thin Syringe (None)  Penetration/Aspiration details (thin syringe') (None)  Pharyngeal - Puree Delayed swallow initiation;Premature spillage  to pyriform sinuses;Reduced pharyngeal peristalsis;Reduced  epiglottic inversion;Reduced anterior laryngeal mobility;Reduced  laryngeal elevation;Reduced airway/laryngeal closure;Reduced  tongue base retraction;Pharyngeal residue - valleculae;Pharyngeal  residue - pyriform sinuses;Lateral channel residue;Compensatory  strategies attempted (Comment);Premature spillage to  valleculae;Pharyngeal residue - posterior pharnyx  Penetration/Aspiration details (puree) Material does not enter  airway  Pharyngeal - Mechanical Soft NT  Penetration/Aspiration details (mechanical soft) (None)  Pharyngeal - Regular (None)  Penetration/Aspiration details  (regular) (None)  Pharyngeal - Multi-consistency (None)  Penetration/Aspiration details (multi-consistency) (None)  Pharyngeal - Pill (None)  Penetration/Aspiration details (pill) (None)  Pharyngeal Comment (None)     CHL IP CERVICAL ESOPHAGEAL PHASE 05/06/2014  Cervical Esophageal Phase Impaired  Pudding Teaspoon (None)  Pudding Cup (None)  Honey Teaspoon (None)  Honey Cup (None)  Honey Syringe (None)  Nectar Teaspoon (None)  Nectar Cup (None)  Nectar Straw (None)  Nectar Syringe (None)  Thin Teaspoon (None)  Thin Cup (None)  Thin Straw (None)  Thin Syringe (None)  Cervical Esophageal Comment (No Data)    CHL IP GO 05/02/2014  Functional Assessment Tool Used skilled clinical judgement  Functional Limitations Swallowing  Swallow Current Status (Y3016) CL  Swallow Goal Status (W1093) CL  Swallow Discharge Status (A3557) CL  Motor Speech Current Status (D2202) (None)  Motor Speech Goal Status (R4270) (None)  Motor Speech Goal Status (W2376) (None)  Spoken Language Comprehension Current Status (E8315) (None)  Spoken Language Comprehension Goal Status (V7616) (None)  Spoken Language Comprehension Discharge Status (W7371) (None)  Spoken Language Expression Current Status (G6269) (None)  Spoken Language Expression Goal Status (S8546) (None)  Spoken Language Expression Discharge Status (E7035) (None)  Attention Current Status (K0938) (None)  Attention Goal Status (H8299) (None)  Attention Discharge Status [phone]) (None)  Memory Current Status [phone]) (None)  Memory Goal Status (P5361) (None)  Memory Discharge Status (W4315) (None)  Voice Current Status (Q0086) (None)  Voice Goal Status (P6195) (None)  Voice Discharge Status [id number]) (None)  Other Speech-Language Pathology Functional Limitation [phone])  (None)  Other Speech-Language Pathology Functional Limitation Goal Status  (Y0998) (None)  Other Speech-Language Pathology Functional Limitation Discharge  Status [phone]) (None)          Celia B. Quentin Ore [facility], CCC-SLP [phone]  [phone]  Shonna Chock 05/06/2014, 11:26 AM     Assessment:  1. Principal Problem: 2.   CVA (cerebral vascular accident) 3. Active Problems: 4.   Diabetes mellitus without complication 5.   DLBCL (diffuse large B cell lymphoma) 6.   CKD (chronic kidney disease), stage III 7.   Mucositis due to antineoplastic therapy 8.   Anemia in neoplastic disease 9.   Protein-calorie malnutrition, severe 10.   Fall at home 11.   Weakness generalized 12.   Right hemiparesis 13.   PAF (paroxysmal atrial fibrillation) 14.   Oropharyngeal dysphagia 15.   Feeding difficulties 16.   Plan:  1. Now back in sinus rhythm. Will hold po cardizem CD and d/c cardizem gtts - change to cardizem suspension per tube for better absorption - please overlap with gtts for 1 hour after administration of per tube cardizem. Agree that he is a poor anticoagulation candidate. Cardiology will be available as needed. Call with questions.  Time Spent Directly with Patient:  15 minutes  Length of Stay:  LOS: 5 days   Pixie Casino, MD, [facility] Attending Cardiologist [facility]  Satina Jerrell C 05/07/2014, 9:51 AM

## 2014-05-07 NOTE — Consult Note (Signed)
Attempted to meet with patient today, but son requested that palliative care not see his dad at this time. Son states that he feels dad is "not ready for palliative care".  I tried to re-assure him what the role of palliative care can be and that I am not just here for end of life or hospice care.  Son spoke for a while with me outside room (appx 40 minutes). Detailed what he though were inconsistencies in care, and disappointment with this hospital stay. He still feels upset by family meeting yesterday as he believes his dad will get stronger and he "knows him better than anyone".  Son also stated that he has experience with Dr's and nurses for past 14 years and knows how the health system works and that inpatient care can be less than ideal.  He is frustrated that IVIG was started and testing for paraneoplastic syndrome was not started earlier.  His goal is that his father goes to rehab and get stronger. I again explained role of palliative care and that we would be glad to be involved in his father's care without agenda of comfort care (son stated that palliative care is just to give people morphine until they die).  I left him our contact information and encouraged him to call if questions or concerns were to arise, or if he needed to spend more time with a physician to ask questions about medical care.   No charge  Doran Clay D.O. Palliative Medicine Team at [facility]  Pager: [phone] Team Phone: [phone]

## 2014-05-08 ENCOUNTER — Inpatient Hospital Stay (HOSPITAL_COMMUNITY): Payer: Medicare Other

## 2014-05-08 LAB — BASIC METABOLIC PANEL
ANION GAP: 5 (ref 5–15)
BUN: 12 mg/dL (ref 6–23)
CHLORIDE: 95 mmol/L — AB (ref 96–112)
CO2: 32 mmol/L (ref 19–32)
CREATININE: 0.53 mg/dL (ref 0.50–1.35)
Calcium: 7.8 mg/dL — ABNORMAL LOW (ref 8.4–10.5)
Glucose, Bld: 149 mg/dL — ABNORMAL HIGH (ref 70–99)
Potassium: 3.3 mmol/L — ABNORMAL LOW (ref 3.5–5.1)
Sodium: 132 mmol/L — ABNORMAL LOW (ref 135–145)

## 2014-05-08 LAB — CBC
HCT: 29 % — ABNORMAL LOW (ref 39.0–52.0)
HEMOGLOBIN: 9.4 g/dL — AB (ref 13.0–17.0)
MCH: 26.9 pg (ref 26.0–34.0)
MCHC: 32.4 g/dL (ref 30.0–36.0)
MCV: 82.9 fL (ref 78.0–100.0)
PLATELETS: 175 10*3/uL (ref 150–400)
RBC: 3.5 MIL/uL — ABNORMAL LOW (ref 4.22–5.81)
RDW: 16.8 % — ABNORMAL HIGH (ref 11.5–15.5)
WBC: 12.5 10*3/uL — ABNORMAL HIGH (ref 4.0–10.5)

## 2014-05-08 LAB — GLUCOSE, CAPILLARY
GLUCOSE-CAPILLARY: 164 mg/dL — AB (ref 70–99)
Glucose-Capillary: 163 mg/dL — ABNORMAL HIGH (ref 70–99)
Glucose-Capillary: 136 mg/dL — ABNORMAL HIGH (ref 70–99)
Glucose-Capillary: 125 mg/dL — ABNORMAL HIGH (ref 70–99)

## 2014-05-08 LAB — PHOSPHORUS: PHOSPHORUS: 2.9 mg/dL (ref 2.3–4.6)

## 2014-05-08 MED ORDER — METOPROLOL TARTRATE 1 MG/ML IV SOLN
2.5000 mg | Freq: Once | INTRAVENOUS | Status: AC
  Administered 2014-05-08: 2.5 mg via INTRAVENOUS
  Filled 2014-05-08: qty 5

## 2014-05-08 MED ORDER — POTASSIUM CHLORIDE 20 MEQ/15ML (10%) PO SOLN
40.0000 meq | ORAL | Status: AC
  Administered 2014-05-08 (×2): 40 meq
  Filled 2014-05-08 (×2): qty 30

## 2014-05-08 MED ORDER — POTASSIUM CHLORIDE CRYS ER 20 MEQ PO TBCR: 40.0000 meq | EXTENDED_RELEASE_TABLET | ORAL | Status: DC

## 2014-05-08 NOTE — Progress Notes (Signed)
Subjective: Patient continues to be weak. Myasthenia panel pending.   Objective: Current vital signs: BP 165/72 mmHg  Pulse 78  Temp(Src) 98.1 F (36.7 C) (Oral)  Resp 19  Ht _0  (1.753 m)  Wt 69.627 kg (153 lb 8 oz)  BMI 22.66 kg/m2  SpO2 98% Vital signs in last 24 hours: Temp:  [97.4 F (36.3 C)-98.4 F (36.9 C)] 98.1 F (36.7 C) (01/24 1121) Pulse Rate:  [43-91] 78 (01/24 1121) Resp:  [15-21] 19 (01/24 1121) BP: (112-186)/(69-83) 165/72 mmHg (01/24 1121) SpO2:  [91 %-98 %] 98 % (01/24 1121) Weight:  [69.627 kg (153 lb 8 oz)] 69.627 kg (153 lb 8 oz) (01/24 0427)  Intake/Output from previous day: 01/23 0701 - 01/24 0700 In: -  Out: 1001 [Urine:1000; Stool:1] Intake/Output this shift: Total I/O In: -  Out: 575 [Urine:575] Nutritional status: Diet NPO time specified Except for: Other (See Comments)  Neurologic Exam: Mental Status: Alert, oriented, thought content appropriate. Speech soft but fluent. Able to follow 3 step commands without difficulty. Cranial Nerves: II: Discs flat bilaterally; Visual fields grossly normal, pupils equal, round, reactive to light and accommodation III,IV, VI: ptosis not present, extra-ocular motions intact bilaterally V,VII: facial bone structure asymmetry with smile symmetric, facial light touch sensation normal bilaterally VIII: hearing normal bilaterally IX,X: gag reflex present XI: bilateral shoulder shrug XII: midline tongue extension Motor: Patient exhibits generalized weakness but when arms lifted together has a drift of the RUE without pronation. Lower extremity testing reveals a positive Hoovers.Unable to lift RLE off bed.  Lifts LLE about 30 degrees Sensory: Pinprick and light touch intact throughout, bilaterally Deep Tendon Reflexes: 1+ and symmetric throughout Plantars: Right: downgoingLeft: downgoing  Lab Results: Basic Metabolic Panel:  Recent Labs Lab 05/03/14 0559  05/04/14 0615 05/05/14 0530 05/07/14 0306 05/08/14 0500 05/08/14 0730  NA 136 135 135 136 132*  --   K 3.8 3.9 4.2 3.7 3.3*  --   CL 96 95* 96 103 95*  --   CO2 _1 32  --   GLUCOSE 143* 152* 141* 139* 149*  --   BUN _2 --   CREATININE 0.76 0.69 0.90 0.67 0.53  --   CALCIUM 9.0 9.0 9.0 8.4 7.8*  --   PHOS  --   --   --   --   --  2.9    Liver Function Tests:  Recent Labs Lab 05/02/14 1316  AST 25  ALT 14  ALKPHOS 72  BILITOT 0.5  PROT 6.2  ALBUMIN 2.6*   No results for input(s): LIPASE, AMYLASE in the last 168 hours. No results for input(s): AMMONIA in the last 168 hours.  CBC:  Recent Labs Lab 05/02/14 1316  05/03/14 0559 05/04/14 0615 05/05/14 0530 05/07/14 0306 05/08/14 0500  WBC 14.5*  --  13.1* 16.8* 16.0* 17.2* 12.5*  NEUTROABS 12.1*  --   --   --   --   --   --   HGB 12.5*  < > 11.5* 12.3* 12.1* 10.1* 9.4*  HCT 37.1*  < > 34.3* 36.4* 36.8* 31.0* 29.0*  MCV 82.8  --  81.9 82.2 85.0 86.6 82.9  PLT 303  --  306 313 280 221 175  < > = values in this interval not displayed.  Cardiac Enzymes: No results for input(s): CKTOTAL, CKMB, CKMBINDEX, TROPONINI in the last 168 hours.  Lipid Panel:  Recent Labs Lab 05/03/14 0559  CHOL 111  TRIG 111  HDL 38*  CHOLHDL 2.9  VLDL 22  LDLCALC 51    CBG:  Recent Labs Lab 05/07/14 1126 05/07/14 1622 05/07/14 2101 05/08/14 0254 05/08/14 0900  GLUCAP 116* 153* 95 163* 136*    Microbiology: Results for orders placed or performed during the hospital encounter of 04/12/14  Blood Culture (routine x 2)     Status: None   Collection Time: 04/12/14  6:34 PM  Result Value Ref Range Status   Specimen Description BLOOD PAC  Final   Special Requests BOTTLES DRAWN AEROBIC AND ANAEROBIC 5CC  Final   Culture   Final    NO GROWTH 5 DAYS Performed at [facility]    Report Status 04/19/2014 FINAL  Final  Blood Culture (routine x 2)     Status: None   Collection Time: 04/12/14  7:24  PM  Result Value Ref Range Status   Specimen Description BLOOD RIGHT ANTECUBITAL  Final   Special Requests BOTTLES DRAWN AEROBIC AND ANAEROBIC 5CC  Final   Culture   Final    NO GROWTH 5 DAYS Performed at [facility]    Report Status 04/19/2014 FINAL  Final  Urine culture     Status: None   Collection Time: 04/12/14  9:17 PM  Result Value Ref Range Status   Specimen Description URINE, CLEAN CATCH  Final   Special Requests NONE  Final   Colony Count NO GROWTH Performed at [facility]   Final   Culture NO GROWTH Performed at [facility]   Final   Report Status 04/13/2014 FINAL  Final  Rapid strep screen     Status: None   Collection Time: 04/12/14  9:23 PM  Result Value Ref Range Status   Streptococcus, Group A Screen (Direct) NEGATIVE NEGATIVE Final    Comment: (NOTE) A Rapid Antigen test may result negative if the antigen level in the sample is below the detection level of this test. The FDA has not cleared this test as a stand-alone test therefore the rapid antigen negative result has reflexed to a Group A Strep culture.   Culture, Group A Strep     Status: None   Collection Time: 04/12/14  9:23 PM  Result Value Ref Range Status   Specimen Description THROAT  Final   Special Requests NONE  Final   Culture   Final    No Beta Hemolytic Streptococci Isolated Performed at [facility]    Report Status 04/14/2014 FINAL  Final  Respiratory virus panel (routine influenza)     Status: None   Collection Time: 04/13/14  2:14 AM  Result Value Ref Range Status   Source - [facility] NASAL WASHINGS  Corrected    Comment: CORRECTED ON 12/30 AT 1834: PREVIOUSLY REPORTED AS NASAL WASHINGS   Respiratory Syncytial Virus A NOT DETECTED  Final   Respiratory Syncytial Virus B NOT DETECTED  Final   Influenza A NOT DETECTED  Final   Influenza B NOT DETECTED  Final   Parainfluenza 1 NOT DETECTED  Final   Parainfluenza 2 NOT DETECTED  Final   Parainfluenza 3  NOT DETECTED  Final   Metapneumovirus NOT DETECTED  Final   Rhinovirus NOT DETECTED  Final   Adenovirus NOT DETECTED  Final   Influenza A H1 NOT DETECTED  Final   Influenza A H3 NOT DETECTED  Final    Comment: (NOTE)       Normal Reference Range for each Analyte: NOT DETECTED Testing performed using the  Lumine[employer] xTAG Respiratory Viral Panel test kit. The analytical performance characteristics of this assay have been determined by [facility].  The modifications have not been cleared or approved by the FDA. This assay has been validated pursuant to the CLIA regulations and is used for clinical purposes. Performed at [facility]     Coagulation Studies: No results for input(s): LABPROT, INR in the last 72 hours.  Imaging: Dg Chest Port 1 View  05/08/2014   CLINICAL DATA:  Hypoxia  EXAM: PORTABLE CHEST - 1 VIEW  COMPARISON:  05/04/2014  FINDINGS: Cardiac shadow is stable. The lungs are well aerated bilaterally. Bibasilar infiltrative changes seen slightly worse on the right than the left. A right-sided chest wall port is again noted.  IMPRESSION: Bibasilar infiltrates new from the prior study.   Electronically Signed   By: Inez Catalina M.D.   On: 05/08/2014 08:09   Dg Abd Portable 1v  05/06/2014   CLINICAL DATA:  Nasogastric tube placement  EXAM: PORTABLE ABDOMEN - 1 VIEW  COMPARISON:  None.  FINDINGS: Nasogastric tube is identified distal tip in the proximal stomach. The visualized bowel gas pattern is normal. There is minimal left pleural effusion.  IMPRESSION: Nasogastric tube noted with distal tip in the proximal stomach.   Electronically Signed   By: Abelardo Diesel M.D.   On: 05/06/2014 19:09    Medications:  I have reviewed the patient's current medications. Scheduled: . allopurinol  300 mg Per Tube Daily  . antiseptic oral rinse  7 mL Mouth Rinse q12n4p  . aspirin  325 mg Per Tube Daily  . atorvastatin  10 mg Per Tube q1800  . chlorhexidine  15 mL Mouth Rinse  BID  . diltiazem  30 mg Per Tube 4 times per day  . docusate  100 mg Per Tube BID  . enoxaparin (LOVENOX) injection  40 mg Subcutaneous Q24H  . IMMUNE GLOBULIN 10% (HUMAN) IV - For Fluid Restriction Only  400 mg/kg Intravenous Q24H  . Influenza vac split quadrivalent PF  0.5 mL Intramuscular Tomorrow-1000  . insulin aspart  0-15 Units Subcutaneous Q6H  . loratadine  10 mg Per Tube [id number]  . metoprolol tartrate  50 mg Per Tube BID  . miconazole  1 application Topical BID  . nystatin  5 mL Oral QID  . ondansetron  8 mg Oral Q12H  . polyethylene glycol  17 g Per Tube Daily  . predniSONE  10 mg Per Tube Q breakfast  . 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  Started on IVIg-s/p 2 of 5 doses.  Myasthenia panel remains pending.    Recommendations: 1.  Continue IVIg 2.  Will f/u MG panel.       LOS: 6 days   Alexis Goodell, MD [facility] [phone] 05/08/2014  1:20 PM

## 2014-05-08 NOTE — Progress Notes (Signed)
A little after 2100 pt converted to Afib RVR with Hr 140-150's. BP also elevated at 170/120's. Pt denied symptoms. Lamar Blinks, NP notified and orders received. Pt medicated with IV metoprolol and via tube with metoprolol and cardizem. Pt's current HR in the 80's afib. Cont to monitor closely.

## 2014-05-08 NOTE — Progress Notes (Signed)
PROGRESS NOTE  Erik Ramirez JSH:[id number] DOB: [birth year] DOA: 05/02/2014 PCP: Pcp Not In System  HPI: 81 year old male with history of diffuse large B-cell lymphoma on chemotherapy, diabetes mellitus, paroxysmal A. fib, hypertension, obstructive sleep apnea was recently hospitalized for neutropenic fever and anemia, elevated troponin due to demand ischemia, and was discharged home with home health. Patient reports that he has been feeling weak since his discharge and not having enough strength in his legs. He fell in his bathroom about 10 days back and says that his legs gave away. He thinks he may have hit his right scapula, hip and right knee and leg. Since then he has been progressively feeling weak and having pain in his right shoulder, hips and the right leg. For past 2-3 days he reports that his right leg feels like "lead ". He denies any syncope, headache or dizziness. He denies any tingling or numbness of his extremities. Denies any bowel or urinary symptoms. Denies any blurred vision. He reports poor appetite and difficulty swallowing due to oral thrush. Patient denies fever, chills, nausea , vomiting, chest pain, palpitations, SOB, abdominal pain, bowel or urinary symptoms. As reported that his right hand has been week for past 1 week. Pt had presented for outpatient swallow eval today and was referred to ED given complains of right-sided weakness.  Hospital course  Patient admitted for further workup, MRI brain, MRI cervical spine, did not show any acute findings, no evidence of acute CVA, or metastatic disease,  patient's symptoms most likely to generalized deconditioning, patient did fail swallow evaluation initially, but on repeat evaluation he was advanced to dysphagia 2 diet, unfortunately to be downgraded again to nothing by mouth after he failed another modified barium swallow on 1/22, NG tube was inserted, and started on tube feeds 1/23, patient continues to have weakness, and  deconditioning, paraneoplastic syndrome was being entertained, so patient was started on IVIG trial, pending Mrs. ischemia gravis workup.   - Patient has known history of A. fib, went into A. fib with RVR on 1/22, where he was started on Cardizem drip. Cardiology were consulted, where they adjusted his Cardizem dosing and metoprolol dosing, and give him one dose of flecainide. Subjective / 24 H Interval events No  significant events overnight, no fever, no cough, no chest pain, no shortness of breath.  Assessment/Plan: Active Problems:   Diabetes mellitus without complication   DLBCL (diffuse large B cell lymphoma)   CKD (chronic kidney disease), stage III   Mucositis due to antineoplastic therapy   Anemia in neoplastic disease   Protein-calorie malnutrition, severe   Fall at home   Weakness generalized   Right hemiparesis   PAF (paroxysmal atrial fibrillation)   Oropharyngeal dysphagia   Feeding difficulties   weakness - Neurology consulted, appreciate input. Symptoms felt more likely secondary to generalized weakness , cachectic neuropathy ,in the absence of significant finding on the imaging, as no metastatic disease or acute CVA was on imaging, patient has progressive weakness, so paraneoplastic syndrome is being considered, myasthenia  gravis workup still pending, discussed with neurology, patient's family, started patient on IVIG empirically 1/23. - MRI brain is negative, - MRI C spine showing Multilevel cervical spondylosis, worst at C5-6 where there is mild spinal stenosis, discussed with neurosurgery on call Dr. Ronnald Ramp,  these findings wouldn't cause the patient symptoms. - subjectively better - Discussed with oncology on call, CNS involvement is unlikely with B-cell lymphoma, no evidence of metastatic is on imaging. - Carotid Doppler no  significant stenosis - 2-D echo showing EF of 55% with grade 1 diastolic dysfunction. - PT/OT .  A. fib with RVR - Patient on metoprolol and  Cardizem - Cardiology consult appreciated, given flecainide 1 dose on 1/22 - Not and the coagulation candidate given his lymphoma, age, severe deconditioning, and chemotherapy, and history of thrombocytopenia.  Dysphagia - SLP evaluation appreciated, patient failed another in MBS evaluation . - Gastroenterology input appreciated, dysphagia felt not a primary esophageal process, and most likely secondary to generalized weakness and deconditioning. - Started on tube feeds via NG tube.  Right shoulder and hip pain - Secondary to fall. No fractures on XR  Leukocytosis - Most likely related to steroids, around baseline, x-ray showing no opacity or infiltrate, urinalysis is  Negative 1/18 - Continue to monitor  DLBCL (diffuse large B cell lymphoma)  - Following with Dr. Jana Hakim as outpatient and on chemotherapy as an outpatient, chemotherapy currently on hold given patient poor functional status.  Diabetes mellitus type 2  continue SSI  CKD (chronic kidney disease), stage III  Renal function stable.   Mucositis due to antineoplastic therapy  - Treated with Diflucan. - Continue with nystatin.  Anemia in neoplastic disease  Monitor H&H  Protein-calorie malnutrition, severe  conitnue supplements, on tube feeds, consulted dietitian.  HTN - Labile, but generally acceptable  -continue home medications, and hydralazine PRN  Diet: Diet NPO time specified Except for: Other (See Comments) Fluids: none DVT Prophylaxis: Lovenox  Code Status: DNR Family Communication: Discussed with son 05/07/14. adisposition plan: Remains inpatient.   Consults Neurology  Cardiology Gastroenterology Palliative care consult requested but HBO a declined palliative care consult.  Procedures:  None    Antibiotics  Anti-infectives    Start     Dose/Rate Route Frequency Ordered Stop   05/02/14 1830  fluconazole (DIFLUCAN) tablet 100 mg  Status:  Discontinued     100 mg Oral Daily 05/02/14  1821 05/07/14 0701       Studies  Dg Chest Port 1 View  05/08/2014   CLINICAL DATA:  Hypoxia  EXAM: PORTABLE CHEST - 1 VIEW  COMPARISON:  05/04/2014  FINDINGS: Cardiac shadow is stable. The lungs are well aerated bilaterally. Bibasilar infiltrative changes seen slightly worse on the right than the left. A right-sided chest wall port is again noted.  IMPRESSION: Bibasilar infiltrates new from the prior study.   Electronically Signed   By: Inez Catalina M.D.   On: 05/08/2014 08:09   Dg Abd Portable 1v  05/06/2014   CLINICAL DATA:  Nasogastric tube placement  EXAM: PORTABLE ABDOMEN - 1 VIEW  COMPARISON:  None.  FINDINGS: Nasogastric tube is identified distal tip in the proximal stomach. The visualized bowel gas pattern is normal. There is minimal left pleural effusion.  IMPRESSION: Nasogastric tube noted with distal tip in the proximal stomach.   Electronically Signed   By: Abelardo Diesel M.D.   On: 05/06/2014 19:09   Dg Swallowing Func-speech Pathology  05/06/2014   Lorre Nick, CCC-SLP     05/06/2014 11:27 AM  Objective Swallowing Evaluation: Modified Barium Swallowing Study   Patient Details  Name: Olvin Rohr MRN: [id number] Date of Birth: 06/28/1925  Today's Date: 05/06/2014 Time: SLP Start Time (ACUTE ONLY): 1045-SLP Stop Time (ACUTE  ONLY): 1105 SLP Time Calculation (min) (ACUTE ONLY): 20 min  Past Medical History:  Past Medical History  Diagnosis Date  . Hypertension   . Hyperlipemia   . Anxiety   . Lymphadenopathy, abdominal   .  Urethral stricture   . Anemia   . BPPV (benign paroxysmal positional vertigo)   . Lymphoma   . Diabetes mellitus without complication   . Obstructive sleep apnea     uses cpap at home  . Sleep apnea    Past Surgical History:  Past Surgical History  Procedure Laterality Date  . Back surgery    . Prostate surgery    . Tonsillectomy     HPI:  HPI: 81 year old male with h/o HTN, anxiety, lymphoma, sleep  apnea, DM, anemia seen for OP MBS due to c/o dysphagia x 1 week.   Patient and son providing history which includes also within the  week, new onset right arm weakness and numbness, right leg  numbness, significant generalized weakness, severe vocal quality  changes, falls. SLP confirms severe dysphonia (almost aphonic) as  well as right sided facial droop.   No Data Recorded  Assessment / Plan / Recommendation CHL IP CLINICAL IMPRESSIONS 05/06/2014  Dysphagia Diagnosis Moderate pharyngeal phase dysphagia;Severe  pharyngeal phase dysphagia  Clinical impression Patient continues to present with a  moderate-severe primarily pharyngeal dysphagia characterized by  base of tongue, laryngeal, and pharyngeal weakness, as well as  reduced UES relaxation. This results in poor pharyngeal clearance  of all tested consistencies, as well as decreased airway  protection of thin liquids (via tsp) and nectar thick liquids  (via cup sip). No penetration was noted on puree consistency.  Significant post-swallow residual noted pharyngeally across  consistencies, which patient continues to sense, responding with  multiple dry swallows or bringing residual back to oral cavity to  be suctioned. Various head postures assessed and were again  unsuccessful to reduce pharyngeal residuals. Overall, risk of  aspiration continues to be high, given severity of deficits, and  risk of poor nutrition is also an issue, given the minimal amount  of material which successfully passes into the esophagus.   Recommend further esophageal work up to assess appropriateness  for management of UES issue.  Pt appears safe for small  individual boluses of nectar thick liquid via teaspoon to  mitigate disuse atrophy, however, primary nutrition, hydration,  and medication is recommended to be NPO at this time.      CHL IP TREATMENT RECOMMENDATION 05/06/2014  Treatment Plan Recommendations Therapy as outlined in treatment  plan below     CHL IP DIET RECOMMENDATION 05/06/2014  Diet Recommendations NPO  Liquid Administration via Spoon   Medication Administration Via alternative means  Compensations (None)  Postural Changes and/or Swallow Maneuvers (None)     CHL IP OTHER RECOMMENDATIONS 05/06/2014  Recommended Consults Consider GI evaluation;Consider ENT  evaluation  Oral Care Recommendations Oral care Q4 per protocol  Other Recommendations Have oral suction available     CHL IP FOLLOW UP RECOMMENDATIONS 05/06/2014  Follow up Recommendations Home health SLP;Outpatient SLP     CHL IP FREQUENCY AND DURATION 05/06/2014  Speech Therapy Frequency (ACUTE ONLY) min 2x/week  Treatment Duration 2 weeks     Pertinent Vitals/Pain None reported    SLP Swallow Goals No flowsheet data found.  No flowsheet data found.    CHL IP REASON FOR REFERRAL 05/06/2014  Reason for Referral Objectively evaluate swallowing function     CHL IP ORAL PHASE 05/06/2014  Lips (None)  Tongue (None)  Mucous membranes (None)  Nutritional status (None)  Other (None)  Oxygen therapy (None)  Oral Phase WFL  Oral - Pudding Teaspoon (None)  Oral - Pudding Cup (None)  Oral -  Honey Teaspoon (None)  Oral - Honey Cup (None)  Oral - Honey Syringe (None)  Oral - Nectar Teaspoon (None)  Oral - Nectar Cup (None)  Oral - Nectar Straw (None)  Oral - Nectar Syringe (None)  Oral - Ice Chips (None)  Oral - Thin Teaspoon (None)  Oral - Thin Cup (None)  Oral - Thin Straw (None)  Oral - Thin Syringe (None)  Oral - Puree (None)  Oral - Mechanical Soft (None)  Oral - Regular (None)  Oral - Multi-consistency (None)  Oral - Pill (None)  Oral Phase - Comment (None)      CHL IP PHARYNGEAL PHASE 05/06/2014  Pharyngeal Phase Impaired  Pharyngeal - Pudding Teaspoon (None)  Penetration/Aspiration details (pudding teaspoon) (None)  Pharyngeal - Pudding Cup (None)  Penetration/Aspiration details (pudding cup) (None)  Pharyngeal - Honey Teaspoon (None)  Penetration/Aspiration details (honey teaspoon) (None)  Pharyngeal - Honey Cup (None)  Penetration/Aspiration details (honey cup) (None)  Pharyngeal - Honey Syringe (None)   Penetration/Aspiration details (honey syringe) (None)  Pharyngeal - Nectar Teaspoon Delayed swallow initiation;Reduced  pharyngeal peristalsis;Reduced epiglottic inversion;Reduced  anterior laryngeal mobility;Reduced laryngeal elevation;Reduced  airway/laryngeal closure;Reduced tongue base  retraction;Pharyngeal residue - valleculae;Pharyngeal residue -  pyriform sinuses;Lateral channel residue;Compensatory strategies  attempted (Comment);Premature spillage to valleculae  Penetration/Aspiration details (nectar teaspoon) (None)  Pharyngeal - Nectar Cup Delayed swallow initiation;Premature  spillage to pyriform sinuses;Reduced pharyngeal  peristalsis;Reduced epiglottic inversion;Reduced anterior  laryngeal mobility;Reduced laryngeal elevation;Reduced  airway/laryngeal closure;Reduced tongue base  retraction;Penetration/Aspiration during swallow;Pharyngeal  residue - valleculae;Pharyngeal residue - pyriform  sinuses;Lateral channel residue;Compensatory strategies attempted  (Comment);Premature spillage to valleculae  Penetration/Aspiration details (nectar cup) Material enters  airway, remains ABOVE vocal cords and not ejected out  Pharyngeal - Nectar Straw (None)  Penetration/Aspiration details (nectar straw) (None)  Pharyngeal - Nectar Syringe (None)  Penetration/Aspiration details (nectar syringe) (None)  Pharyngeal - Ice Chips (None)  Penetration/Aspiration details (ice chips) (None)  Pharyngeal - Thin Teaspoon Delayed swallow initiation;Premature  spillage to valleculae;Premature spillage to pyriform  sinuses;Reduced pharyngeal peristalsis;Reduced epiglottic  inversion;Reduced tongue base retraction;Reduced anterior  laryngeal mobility;Penetration/Aspiration during swallow;Reduced  laryngeal elevation;Reduced airway/laryngeal closure;Trace  aspiration;Pharyngeal residue - valleculae;Pharyngeal residue -  pyriform sinuses;Pharyngeal residue - posterior pharnyx;Lateral  channel residue;Compensatory strategies  attempted (Comment)  Penetration/Aspiration details (thin teaspoon) Material enters  airway, passes BELOW cords without attempt by patient to eject  out (silent aspiration)  Pharyngeal - Thin Cup NT  Penetration/Aspiration details (thin cup) (None)  Pharyngeal - Thin Straw (None)  Penetration/Aspiration details (thin straw) (None)  Pharyngeal - Thin Syringe (None)  Penetration/Aspiration details (thin syringe') (None)  Pharyngeal - Puree Delayed swallow initiation;Premature spillage  to pyriform sinuses;Reduced pharyngeal peristalsis;Reduced  epiglottic inversion;Reduced anterior laryngeal mobility;Reduced  laryngeal elevation;Reduced airway/laryngeal closure;Reduced  tongue base retraction;Pharyngeal residue - valleculae;Pharyngeal  residue - pyriform sinuses;Lateral channel residue;Compensatory  strategies attempted (Comment);Premature spillage to  valleculae;Pharyngeal residue - posterior pharnyx  Penetration/Aspiration details (puree) Material does not enter  airway  Pharyngeal - Mechanical Soft NT  Penetration/Aspiration details (mechanical soft) (None)  Pharyngeal - Regular (None)  Penetration/Aspiration details (regular) (None)  Pharyngeal - Multi-consistency (None)  Penetration/Aspiration details (multi-consistency) (None)  Pharyngeal - Pill (None)  Penetration/Aspiration details (pill) (None)  Pharyngeal Comment (None)     CHL IP CERVICAL ESOPHAGEAL PHASE 05/06/2014  Cervical Esophageal Phase Impaired  Pudding Teaspoon (None)  Pudding Cup (None)  Honey Teaspoon (None)  Honey Cup (None)  Honey Syringe (None)  Nectar Teaspoon (None)  Nectar Cup (None)  Nectar Straw (None)  Nectar  Syringe (None)  Thin Teaspoon (None)  Thin Cup (None)  Thin Straw (None)  Thin Syringe (None)  Cervical Esophageal Comment (No Data)    CHL IP GO 05/02/2014  Functional Assessment Tool Used skilled clinical judgement  Functional Limitations Swallowing  Swallow Current Status (X5284) CL  Swallow Goal Status (X3244) CL  Swallow Discharge  Status (W1027) CL  Motor Speech Current Status (O5366) (None)  Motor Speech Goal Status (Y4034) (None)  Motor Speech Goal Status (V4259) (None)  Spoken Language Comprehension Current Status (D6387) (None)  Spoken Language Comprehension Goal Status (F6433) (None)  Spoken Language Comprehension Discharge Status (I9518) (None)  Spoken Language Expression Current Status (A4166) (None)  Spoken Language Expression Goal Status (A6301) (None)  Spoken Language Expression Discharge Status [phone]) (None)  Attention Current Status (N2355) (None)  Attention Goal Status (D3220) (None)  Attention Discharge Status (U5427) (None)  Memory Current Status (C6237) (None)  Memory Goal Status (S2831) (None)  Memory Discharge Status (D1761) (None)  Voice Current Status (Y0737) (None)  Voice Goal Status (T0626) (None)  Voice Discharge Status (R4854) (None)  Other Speech-Language Pathology Functional Limitation [phone])  (None)  Other Speech-Language Pathology Functional Limitation Goal Status  (J0093) (None)  Other Speech-Language Pathology Functional Limitation Discharge  Status [phone]) (None)          [facility], CCC-SLP [phone]  Shonna Chock 05/06/2014, 11:26 AM     Objective  Filed Vitals:   05/08/14 0015 05/08/14 0415 05/08/14 0427 05/08/14 0735  BP: 186/82 167/75  112/79  Pulse:    43  Temp:  98.1 F (36.7 C)  97.9 F (36.6 C)  TempSrc:  Oral  Oral  Resp: 21 20  15   Height:      Weight:   69.627 kg (153 lb 8 oz)   SpO2: 92% 95%  91%   Exam:  General:  NAD, awake alert  HEENT: no scleral icterus  Cardiovascular: RRR without MRG  Respiratory: CTA biL, no wheezing  Abdomen: soft, non tender  MSK/Extremities: no clubbing or cyanosis   Skin: no rashes  Neuro: Generalized weakness, weakness in proximal muscles in lower extremity remains the same.  Data Reviewed: Basic Metabolic Panel:  Recent Labs Lab 05/03/14 0559 05/04/14 0615 05/05/14 0530 05/07/14 0306 05/08/14 0500  05/08/14 0730  NA 136 135 135 136 132*  --   K 3.8 3.9 4.2 3.7 3.3*  --   CL 96 95* 96 103 95*  --   CO2 31 31 31 29  32  --   GLUCOSE 143* 152* 141* 139* 149*  --   BUN 18 15 20 21 12   --   CREATININE 0.76 0.69 0.90 0.67 0.53  --   CALCIUM 9.0 9.0 9.0 8.4 7.8*  --   PHOS  --   --   --   --   --  2.9   Liver Function Tests:  Recent Labs Lab 05/02/14 1316  AST 25  ALT 14  ALKPHOS 72  BILITOT 0.5  PROT 6.2  ALBUMIN 2.6*   CBC:  Recent Labs Lab 05/02/14 1316  05/03/14 0559 05/04/14 0615 05/05/14 0530 05/07/14 0306 05/08/14 0500  WBC 14.5*  --  13.1* 16.8* 16.0* 17.2* 12.5*  NEUTROABS 12.1*  --   --   --   --   --   --   HGB 12.5*  < > 11.5* 12.3* 12.1* 10.1* 9.4*  HCT 37.1*  < > 34.3* 36.4* 36.8* 31.0* 29.0*  MCV 82.8  --  81.9 82.2 85.0 86.6 82.9  PLT 303  --  306 313 280 221 175  < > = values in this interval not displayed. BNP (last 3 results)  Recent Labs  01/05/14 2142  PROBNP 343.7   CBG:  Recent Labs Lab 05/07/14 1126 05/07/14 1622 05/07/14 2101 05/08/14 0254 05/08/14 0900  GLUCAP 116* 153* 95 163* 136*   Scheduled Meds: . allopurinol  300 mg Per Tube Daily  . antiseptic oral rinse  7 mL Mouth Rinse q12n4p  . aspirin  325 mg Per Tube Daily  . atorvastatin  10 mg Per Tube q1800  . chlorhexidine  15 mL Mouth Rinse BID  . diltiazem  30 mg Per Tube 4 times per day  . docusate  100 mg Per Tube BID  . enoxaparin (LOVENOX) injection  40 mg Subcutaneous Q24H  . IMMUNE GLOBULIN 10% (HUMAN) IV - For Fluid Restriction Only  400 mg/kg Intravenous Q24H  . Influenza vac split quadrivalent PF  0.5 mL Intramuscular Tomorrow-1000  . insulin aspart  0-15 Units Subcutaneous Q6H  . loratadine  10 mg Per Tube BH-q7a  . metoprolol tartrate  50 mg Per Tube BID  . miconazole  1 application Topical BID  . nystatin  5 mL Oral QID  . ondansetron  8 mg Oral Q12H  . polyethylene glycol  17 g Per Tube Daily  . potassium chloride  40 mEq Per Tube Q4H  . predniSONE   10 mg Per Tube Q breakfast  . sodium chloride  3 mL Intravenous Q12H   Continuous Infusions: . sodium chloride 75 mL/hr at 05/08/14 0448  . feeding supplement (JEVITY 1.2 CAL) 1,000 mL (05/08/14 0806)   Time spent: 30 minutes  Phillips Climes , MD [facility] Pager [phone] If 7 PM - 7 AM, please contact night-coverage at [email or web address], password [facility] 05/08/2014, 10:18 AM  LOS: 6 days

## 2014-05-08 NOTE — Progress Notes (Signed)
Pt is unable to wear CPAP tonight. Pt has NG tube and says that last night he tried to wear it and it was very aggravating. Pt does have a continuous pulse ox to be monitored. RN aware to call if there are concerns

## 2014-05-09 ENCOUNTER — Inpatient Hospital Stay (HOSPITAL_COMMUNITY): Payer: Medicare Other

## 2014-05-09 DIAGNOSIS — E785 Hyperlipidemia, unspecified: Secondary | ICD-10-CM

## 2014-05-09 DIAGNOSIS — I10 Essential (primary) hypertension: Secondary | ICD-10-CM

## 2014-05-09 DIAGNOSIS — I639 Cerebral infarction, unspecified: Secondary | ICD-10-CM | POA: Insufficient documentation

## 2014-05-09 DIAGNOSIS — T17908A Unspecified foreign body in respiratory tract, part unspecified causing other injury, initial encounter: Secondary | ICD-10-CM | POA: Insufficient documentation

## 2014-05-09 LAB — BASIC METABOLIC PANEL
Anion gap: 7 (ref 5–15)
BUN: 12 mg/dL (ref 6–23)
CALCIUM: 7.8 mg/dL — AB (ref 8.4–10.5)
CHLORIDE: 91 mmol/L — AB (ref 96–112)
CO2: 31 mmol/L (ref 19–32)
Creatinine, Ser: 0.53 mg/dL (ref 0.50–1.35)
GFR calc Af Amer: >90 mL/min (ref >90)
Glucose, Bld: 183 mg/dL — ABNORMAL HIGH (ref 70–99)
Potassium: 3.6 mmol/L (ref 3.5–5.1)
SODIUM: 129 mmol/L — AB (ref 135–145)

## 2014-05-09 LAB — GLUCOSE, CAPILLARY
GLUCOSE-CAPILLARY: 139 mg/dL — AB (ref 70–99)
Glucose-Capillary: 170 mg/dL — ABNORMAL HIGH (ref 70–99)
Glucose-Capillary: 179 mg/dL — ABNORMAL HIGH (ref 70–99)
Glucose-Capillary: 138 mg/dL — ABNORMAL HIGH (ref 70–99)

## 2014-05-09 LAB — CBC
HEMATOCRIT: 29.8 % — AB (ref 39.0–52.0)
HEMOGLOBIN: 10 g/dL — AB (ref 13.0–17.0)
MCH: 27.9 pg (ref 26.0–34.0)
MCHC: 33.6 g/dL (ref 30.0–36.0)
MCV: 83 fL (ref 78.0–100.0)
Platelets: 182 10*3/uL (ref 150–400)
RBC: 3.59 MIL/uL — ABNORMAL LOW (ref 4.22–5.81)
RDW: 16.5 % — ABNORMAL HIGH (ref 11.5–15.5)
WBC: 14.2 10*3/uL — ABNORMAL HIGH (ref 4.0–10.5)

## 2014-05-09 MED ORDER — VANCOMYCIN HCL 10 G IV SOLR
1250.0000 mg | Freq: Once | INTRAVENOUS | Status: AC
  Administered 2014-05-09: 1250 mg via INTRAVENOUS
  Filled 2014-05-09 (×3): qty 1250

## 2014-05-09 MED ORDER — VANCOMYCIN HCL IN DEXTROSE 750-5 MG/150ML-% IV SOLN
750.0000 mg | Freq: Two times a day (BID) | INTRAVENOUS | Status: DC
  Filled 2014-05-09: qty 150

## 2014-05-09 MED ORDER — PIPERACILLIN-TAZOBACTAM 3.375 G IVPB
3.3750 g | Freq: Three times a day (TID) | INTRAVENOUS | Status: DC
  Administered 2014-05-09: 3.375 g via INTRAVENOUS
  Filled 2014-05-09 (×3): qty 50

## 2014-05-09 MED ORDER — DILTIAZEM HCL 100 MG IV SOLR
5.0000 mg/h | INTRAVENOUS | Status: DC
  Administered 2014-05-09: 5 mg/h via INTRAVENOUS
  Administered 2014-05-09: 15 mg/h via INTRAVENOUS
  Filled 2014-05-09: qty 100

## 2014-05-09 MED ORDER — CLORAZEPATE DIPOTASSIUM 3.75 MG PO TABS: 7.5000 mg | ORAL_TABLET | Freq: Two times a day (BID) | ORAL | Status: DC | PRN

## 2014-05-09 MED ORDER — DOCUSATE SODIUM 100 MG PO CAPS: 100.0000 mg | ORAL_CAPSULE | Freq: Two times a day (BID) | ORAL | Status: DC

## 2014-05-09 MED ORDER — MORPHINE SULFATE 2 MG/ML IJ SOLN: 1.0000 mg | INTRAMUSCULAR | Status: DC | PRN

## 2014-05-09 MED ORDER — BACID PO TABS
2.0000 | ORAL_TABLET | Freq: Four times a day (QID) | ORAL | Status: DC
  Filled 2014-05-09 (×2): qty 2

## 2014-05-09 MED ORDER — METOPROLOL TARTRATE 1 MG/ML IV SOLN
2.5000 mg | Freq: Once | INTRAVENOUS | Status: AC
  Administered 2014-05-09: 2.5 mg via INTRAVENOUS
  Filled 2014-05-09: qty 5

## 2014-05-09 MED ORDER — MORPHINE SULFATE 2 MG/ML IJ SOLN
1.0000 mg | INTRAMUSCULAR | Status: DC | PRN
  Administered 2014-05-09: 1 mg via INTRAVENOUS
  Filled 2014-05-09: qty 1

## 2014-05-09 NOTE — Progress Notes (Signed)
Oncology discussed with family, family decided to go with hospice care, reconsulted palliative care. Phillips Climes MD

## 2014-05-09 NOTE — Progress Notes (Signed)
Speech Language Pathology Treatment: Dysphagia  Patient Details Name: Erik Ramirez MRN: [id number] DOB: 1926-01-10 Today's Date: 05/09/2014 Time: 7322-0254 SLP Time Calculation (min) (ACUTE ONLY): 16 min  Assessment / Plan / Recommendation Clinical Impression  F/u diagnostic treatment complete with focus on po trials for readiness to resume a po diet. Patient with severe confusion this am, able to follow commands but with poor sustained attention, verbosity, poor orientation, all of which impacting safety of swallow. Suspect an increase in swallow initiation delay and overt indication of decreased airway protection with nectar thick liquids trials via tsp characterized by wet vocal quality and delayed throat clearing. Multiple swallows which were previously utilized independently by patient in attempts to clear known pharyngeal residue are almost absent today. Do not feel this in indicative of improved strength but instead poor awareness of impairments due to current mentation. SLP provided max verbal cueing for use of multiple swallows per bolus as well as consistent throat clear post swallow in attempts to facilitate protection of airway. Aspiration risk remains high. SLP will continue to f/u for diagnostic trials at bedside.     HPI HPI: 81 year old male with history of diffuse large B-cell lymphoma on chemotherapy, diabetes mellitus, paroxysmal A. fib, hypertension, obstructive sleep apnea was recently hospitalized for neutropenic fever and anemia. Discharged home but then reported progressive weakness. Seen for OP MBS 1/18 where SLP noted right sided facial droop. Pateint confirming right sided upper and lower extremity weakness. Patient admitted. MRI brain, MRI c-spine showing no acute infarct. Per neuro, patient with possible paraneoplastic syndrome. Myasthenia gravia panel pending. Noted f/u MBS complete with recommendations for NPO except small tsp boluses of nectar thick liquids and ice chips  after oral care as well as GI f/u for possible esophageal involvement. GI workup negative.    Pertinent Vitals Pain Assessment: No/denies pain  SLP Plan  Continue with current plan of care    Recommendations Diet recommendations: NPO (except ice chips after oral care) Medication Administration: Via alternative means              Oral Care Recommendations:  (QID) Follow up Recommendations:  (TBD) Plan: Continue with current plan of care    [place], CCC-SLP [phone]   Abbegayle Denault Meryl 05/09/2014, 9:57 AM

## 2014-05-09 NOTE — Progress Notes (Signed)
Occupational Therapy Treatment Patient Details Name: Erik Ramirez MRN: [id number] DOB: [birth year] Today's Date: 05/09/2014    History of present illness Adm 05/02/14 with Rt sided weakness. Pt with multiple falls since d/c home from hospital early January (last fell 1/10 in bathroom, per son) Xrays Rt shoulder, pelvis/hips negative; MRI brain negative. MRI cervical spine +mild spinal stenosis C5-6 PMHx- FTT post chemotherapy treatments for lymphoma, DM, afib, HTN, BPPV   OT comments  Pt performed ADLs sitting EOB-pt with decreased balance-cues on how to help maintain balance. Continue to recommend CIR for rehab.  Follow Up Recommendations  CIR;Supervision/Assistance - 24 hour    Equipment Recommendations  Other (comment) (tbd)    Recommendations for Other Services      Precautions / Restrictions Precautions Precautions: Fall Restrictions Weight Bearing Restrictions: No       Mobility Bed Mobility Overal bed mobility: Needs Assistance Bed Mobility: Sit to Supine;Supine to Sit     Supine to sit: Max assist Sit to supine: +2 for physical assistance;Mod assist   General bed mobility comments: cues for technique Assist with hips and trunk to get EOB. Cues and assist to scoot HOB.  Transfers                 General transfer comment: did not perform    Balance    see ADL section                               ADL Overall ADL's : Needs assistance/impaired     Grooming: Wash/dry face;Maximal assistance;Sitting;Applying deodorant (EOB)   Upper Body Bathing: Maximal assistance;Sitting (EOB)   Lower Body Bathing: Maximal assistance;Sitting/lateral leans   Upper Body Dressing : Sitting;Maximal assistance (EOB)                   Functional mobility during ADLs:  (did not perform) General ADL Comments: Pt sat EOB and performed ADL tasks-pt with decreased balance requiring Min guard-Max A for balance.Educated on dressing technique. Pt having  difficulty using Rt arm. Explained how to apply deodorant under left arm using left arm-pt unable to do so. Pt difficulty to understand when he is talking.      Vision                     Perception     Praxis      Cognition  Awake/Alert Behavior During Therapy: WFL for tasks assessed/performed Overall Cognitive Status: Within Functional Limits for tasks assessed                       Extremity/Trunk Assessment               Exercises     Shoulder Instructions       General Comments      Pertinent Vitals/ Pain       Pain Assessment:  (reports pain in back when sitting on EOB-did not rate)  Home Living                                          Prior Functioning/Environment              Frequency Min 2X/week     Progress Toward Goals  OT Goals(current goals can now be found in the care plan section)  Progress towards OT goals: Not progressing toward goals - comment  Acute Rehab OT Goals Patient Stated Goal: not stated OT Goal Formulation: With patient Time For Goal Achievement: 05/19/14 Potential to Achieve Goals: Good  Plan Discharge plan remains appropriate    Co-evaluation                 End of Session Equipment Utilized During Treatment: Gait belt;Other (comment) (O2 used part of session)   Activity Tolerance Patient tolerated treatment well   Patient Left in bed;with nursing/sitter in room (MD also present)   Nurse Communication Other (comment) (assisted OT)        Time: 1537-9432 OT Time Calculation (min): 22 min  Charges: OT General Charges $OT Visit: 1 Procedure OT Treatments $Self Care/Home Management : 8-22 mins  Benito Mccreedy OTR/L [phone] 05/09/2014, 11:45 AM

## 2014-05-09 NOTE — Progress Notes (Signed)
ANTIBIOTIC CONSULT NOTE - INITIAL  Pharmacy Consult for vancomycin and zosyn Indication: pneumonia  Allergies  Allergen Reactions  . Rituximab Other (See Comments)    Developed wheezing & rigors when infusion rate increased to 100 mg/hr.  Tolerates rate of 50 mg/hr.  . Caffeine Other (See Comments)    Makes patient feel like he's going to die  . Morphine And Related Other (See Comments)    excitability  . Penicillins Rash    Patient Measurements: Height: 5' 9"  (175.3 cm) Weight: 153 lb 14.1 oz (69.8 kg) IBW/kg (Calculated) : 70.7   Vital Signs: Temp: 97.8 F (36.6 C) (01/25 0400) Temp Source: Oral (01/25 0400) BP: 147/86 mmHg (01/25 0615) Pulse Rate: 57 (01/25 0615) Intake/Output from previous day: 01/24 0701 - 01/25 0700 In: 4202.5 [I.V.:3452.5; NG/GT:750] Out: 3075 [Urine:3025; Emesis/NG output:50] Intake/Output from this shift: Total I/O In: 0  Out: 100 [Urine:100]  Labs:  Recent Labs  05/07/14 0306 05/08/14 0500 05/09/14 0820  WBC 17.2* 12.5* 14.2*  HGB 10.1* 9.4* 10.0*  PLT 221 175 182  CREATININE 0.67 0.53  --    Estimated Creatinine Clearance: 65.4 mL/min (by C-G formula based on Cr of 0.53). No results for input(s): VANCOTROUGH, VANCOPEAK, VANCORANDOM, GENTTROUGH, GENTPEAK, GENTRANDOM, TOBRATROUGH, TOBRAPEAK, TOBRARND, AMIKACINPEAK, AMIKACINTROU, AMIKACIN in the last 72 hours.   Microbiology: Recent Results (from the past 720 hour(s))  Blood Culture (routine x 2)     Status: None   Collection Time: 04/12/14  6:34 PM  Result Value Ref Range Status   Specimen Description BLOOD PAC  Final   Special Requests BOTTLES DRAWN AEROBIC AND ANAEROBIC 5CC  Final   Culture   Final    NO GROWTH 5 DAYS Performed at [facility]    Report Status 04/19/2014 FINAL  Final  Blood Culture (routine x 2)     Status: None   Collection Time: 04/12/14  7:24 PM  Result Value Ref Range Status   Specimen Description BLOOD RIGHT ANTECUBITAL  Final   Special  Requests BOTTLES DRAWN AEROBIC AND ANAEROBIC 5CC  Final   Culture   Final    NO GROWTH 5 DAYS Performed at [facility]    Report Status 04/19/2014 FINAL  Final  Urine culture     Status: None   Collection Time: 04/12/14  9:17 PM  Result Value Ref Range Status   Specimen Description URINE, CLEAN CATCH  Final   Special Requests NONE  Final   Colony Count NO GROWTH Performed at [facility]   Final   Culture NO GROWTH Performed at [facility]   Final   Report Status 04/13/2014 FINAL  Final  Rapid strep screen     Status: None   Collection Time: 04/12/14  9:23 PM  Result Value Ref Range Status   Streptococcus, Group A Screen (Direct) NEGATIVE NEGATIVE Final    Comment: (NOTE) A Rapid Antigen test may result negative if the antigen level in the sample is below the detection level of this test. The FDA has not cleared this test as a stand-alone test therefore the rapid antigen negative result has reflexed to a Group A Strep culture.   Culture, Group A Strep     Status: None   Collection Time: 04/12/14  9:23 PM  Result Value Ref Range Status   Specimen Description THROAT  Final   Special Requests NONE  Final   Culture   Final    No Beta Hemolytic Streptococci Isolated Performed at [facility]    Report Status 04/14/2014 FINAL  Final  Respiratory virus panel (routine influenza)     Status: None   Collection Time: 04/13/14  2:14 AM  Result Value Ref Range Status   Source - [facility] NASAL WASHINGS  Corrected    Comment: CORRECTED ON 12/30 AT 1834: PREVIOUSLY REPORTED AS NASAL WASHINGS   Respiratory Syncytial Virus A NOT DETECTED  Final   Respiratory Syncytial Virus B NOT DETECTED  Final   Influenza A NOT DETECTED  Final   Influenza B NOT DETECTED  Final   Parainfluenza 1 NOT DETECTED  Final   Parainfluenza 2 NOT DETECTED  Final   Parainfluenza 3 NOT DETECTED  Final   Metapneumovirus NOT DETECTED  Final   Rhinovirus NOT DETECTED  Final    Adenovirus NOT DETECTED  Final   Influenza A H1 NOT DETECTED  Final   Influenza A H3 NOT DETECTED  Final    Comment: (NOTE)       Normal Reference Range for each Analyte: NOT DETECTED Testing performed using the Luminex xTAG Respiratory Viral Panel test kit. The analytical performance characteristics of this assay have been determined by [facility].  The modifications have not been cleared or approved by the FDA. This assay has been validated pursuant to the CLIA regulations and is used for clinical purposes. Performed at [facility] History: Past Medical History  Diagnosis Date  . Hypertension   . Hyperlipemia   . Anxiety   . Lymphadenopathy, abdominal   . Urethral stricture   . Anemia   . BPPV (benign paroxysmal positional vertigo)   . Lymphoma   . Diabetes mellitus without complication   . Obstructive sleep apnea     uses cpap at home  . Sleep apnea     Medications:  Prescriptions prior to admission  Medication Sig Dispense Refill Last Dose  . allopurinol (ZYLOPRIM) 300 MG tablet Take 1 tablet (300 mg total) by mouth daily. 90 tablet 1 05/02/2014 at Unknown time  . aspirin 325 MG tablet Take 1 tablet (325 mg total) by mouth daily. 30 tablet 0 05/02/2014 at Unknown time  . clorazepate (TRANXENE) 7.5 MG tablet Take 1 tablet (7.5 mg total) by mouth 2 (two) times daily as needed for anxiety or sleep. (Patient taking differently: Take 7.5 mg by mouth every morning. ) 180 tablet 0 Past Week at Unknown time  . clotrimazole-betamethasone (LOTRISONE) cream Apply 1 application topically 2 (two) times daily as needed (for itching).    Past Week at Unknown time  . diltiazem (CARDIZEM CD) 120 MG 24 hr capsule Take 1 capsule (120 mg total) by mouth daily. 90 capsule 1 05/02/2014 at Unknown time  . docusate sodium 100 MG CAPS Take 100 mg by mouth 2 (two) times daily. 10 capsule 0 05/02/2014 at Unknown time  . feeding supplement, ENSURE COMPLETE, (ENSURE  COMPLETE) LIQD Take 237 mLs by mouth daily. 20 Bottle 1 05/01/2014 at Unknown time  . fluconazole (DIFLUCAN) 100 MG tablet Take 1 tablet (100 mg total) by mouth daily. 30 tablet 0 05/02/2014 at Unknown time  . insulin aspart (NOVOLOG) 100 UNIT/ML injection Inject 0-20 Units into the skin 3 (three) times daily with meals. 60 mL 3 05/02/2014 at Unknown time  . lidocaine-prilocaine (EMLA) cream Apply 1 application topically as needed. APPLY TO PORT SITE 1 HOUR PRIOR TO THERAPY. DO NOT RUB IN. COVER WITH SARAN WRAP. 30 g 2 Past Week at Unknown time  .  loratadine (CLARITIN) 10 MG tablet Take 10 mg by mouth every morning.   05/02/2014 at Unknown time  . metoprolol (LOPRESSOR) 50 MG tablet Take 25 mg by mouth 2 (two) times daily.     05/02/2014 at 0800  . naproxen (NAPROSYN) 375 MG tablet Take 1 tablet (375 mg total) by mouth 3 (three) times daily with meals. (Patient taking differently: Take 375 mg by mouth 3 (three) times daily as needed for moderate pain. ) 20 tablet 0 Past Month at Unknown time  . nystatin (MYCOSTATIN) 100000 UNIT/ML suspension SWISH AND SWALLOW 5 MLS BY MOUTH FOUR TIMES DAILY (Patient taking differently: Take 5 mLs by mouth 4 (four) times daily. SWISH AND SWALLOW) 240 mL 3 Past Month at Unknown time  . ondansetron (ZOFRAN) 8 MG tablet Take 1 tablet (8 mg total) by mouth every 12 (twelve) hours. (Patient taking differently: Take 8 mg by mouth 2 (two) times daily as needed for nausea. ) 20 tablet 0 unknown  . polyethylene glycol (MIRALAX / GLYCOLAX) packet Take 17 g by mouth daily.   05/01/2014 at Unknown time  . predniSONE (DELTASONE) 10 MG tablet Take 1 tablet (10 mg total) by mouth daily with breakfast. 30 tablet 0 05/02/2014 at Unknown time  . simvastatin (ZOCOR) 20 MG tablet Take 1 tablet (20 mg total) by mouth daily. 90 tablet 3 05/02/2014 at Unknown time  . sucralfate (CARAFATE) 1 GM/10ML suspension Take 10 mLs (1 g total) by mouth 4 (four) times daily -  with meals and at bedtime. 420 mL 1  05/02/2014 at Unknown time  . traMADol (ULTRAM) 50 MG tablet Take 50 mg by mouth every 6 (six) hours as needed for moderate pain.   Past Month at Unknown time  . glucose blood test strip ONE TOUCH ULTRA .CHECK BLOOD SUGAR AC AND HS DAILY 480 each 3 Taking  . miconazole (MICATIN) 2 % cream Apply 1 application topically 2 (two) times daily. Apply to groin area twice daily until clear (Patient not taking: Reported on 04/25/2014) 45 g 0 Not Taking  . predniSONE (DELTASONE) 20 MG tablet Take 3 tablets (60 mg total) by mouth daily with breakfast. (Patient taking differently: Take 60 mg by mouth daily with breakfast. For 5 days after treatment) 15 tablet 5 Taking   Assessment: 81 yo man to start broad spectrum antibiotics for PNA.  CrCl ~ 65  mL/min.  Goal of Therapy:  Vancomycin trough level 15-20 mcg/ml  Plan:  Zosyn 3.375 gm IV q8 hours Vancomycin 1250 mg IV X 1 then 750 mg IV q12 hours F/u renal function, cultures and clinical course F/u vanc trough when appropriate  Thanks for allowing pharmacy to be a part of this patient's care.  Excell Seltzer, PharmD Clinical Pharmacist, [phone] 05/09/2014,10:05 AM

## 2014-05-09 NOTE — Progress Notes (Signed)
Pts Hr down into 110's for few minutes then back up 120-150's. Pt denies symptoms. Lamar Blinks, NP requesting cardiology be called. Dr. Wynonia Lawman called and orders for IV Cardizem given. Pt started on Cardizem gtt at 5cc/hr. Will cont to monitor.

## 2014-05-09 NOTE — Progress Notes (Signed)
Subjective: Some hallucinations overnight.   Exam: Filed Vitals:   05/09/14 1210  BP: 117/86  Pulse: 80  Temp: 98.7 F (37.1 C)  Resp: 20   Gen: In bed, NAD MS: Awake, oriented to hospital , month, year. Very hoarse and dysarthric.  EL:FYBOF, EOMI Motor: 3/5 right arm, 2/5 right leg.  Sensory:endorses sensation on the right.    Impression: 81 yo M with rapidly progressive asymmetric upper and lower extremity weakness without clear cause on MRI brain or C-spine. With this degree of asymmetry, I do think that simple deconditioning is unlikely. An autoimmune cause such as a paraneoplastic syndrome is possible and he has been started on IVIG.   Recommendations: 1) Continue IVIG Day#3 today 2) will continue to follow.   Roland Rack, MD [facility] [phone]  If 7pm- 7am, please page neurology on call as listed in [place].

## 2014-05-09 NOTE — Progress Notes (Signed)
Subjective: He is hard to understand  Objective: Vital signs in last 24 hours: Temp:  [97.4 F (36.3 C)-98.1 F (36.7 C)] 97.8 F (36.6 C) (01/25 0400) Pulse Rate:  [37-154] 57 (01/25 0615) Resp:  [19-21] 21 (01/24 1621) BP: (147-177)/(72-120) 147/86 mmHg (01/25 0615) SpO2:  [96 %-100 %] 98 % (01/25 0615) Weight:  [153 lb 14.1 oz (69.8 kg)] 153 lb 14.1 oz (69.8 kg) (01/25 0500) Last BM Date: 05/04/14  Intake/Output from previous day: 01/24 0701 - 01/25 0700 In: 4202.5 [I.V.:3452.5; NG/GT:750] Out: 3075 [Urine:3025; Emesis/NG output:50] Intake/Output this shift: Total I/O In: 0  Out: 100 [Urine:100]  Medications Current Facility-Administered Medications  Medication Dose Route Frequency Provider Last Rate Last Dose  . 0.9 %  sodium chloride infusion   Intravenous Continuous Nishant Dhungel, MD 75 mL/hr at 05/08/14 2257    . acetaminophen (TYLENOL) tablet 650 mg  650 mg Oral Q6H PRN Nishant Dhungel, MD   650 mg at 05/04/14 2351   Or  . acetaminophen (TYLENOL) suppository 650 mg  650 mg Rectal Q6H PRN Nishant Dhungel, MD      . allopurinol (ZYLOPRIM) tablet 300 mg  300 mg Per Tube Daily Phillips Climes, MD   300 mg at 05/08/14 1016  . antiseptic oral rinse (CPC / CETYLPYRIDINIUM CHLORIDE 0.05%) solution 7 mL  7 mL Mouth Rinse q12n4p Nishant Dhungel, MD   7 mL at 05/08/14 1702  . aspirin tablet 325 mg  325 mg Per Tube Daily Phillips Climes, MD   325 mg at 05/08/14 1016  . atorvastatin (LIPITOR) tablet 10 mg  10 mg Per Tube q1800 Phillips Climes, MD   10 mg at 05/08/14 1701  . chlorhexidine (PERIDEX) 0.12 % solution 15 mL  15 mL Mouth Rinse BID Phillips Climes, MD   15 mL at 05/09/14 2683  . clorazepate (TRANXENE) tablet 7.5 mg  7.5 mg Per Tube BID PRN Phillips Climes, MD   7.5 mg at 05/09/14 0113  . diltiazem (CARDIZEM) 10 mg/ml oral suspension 30 mg  30 mg Per Tube 4 times per day Pixie Casino, MD   30 mg at 05/08/14 2257  . diltiazem (CARDIZEM) 100 mg in dextrose 5  % 100 mL (1 mg/mL) infusion  5-15 mg/hr Intravenous Titrated Jacolyn Reedy, MD 15 mL/hr at 05/09/14 0730 15 mg/hr at 05/09/14 0730  . docusate (COLACE) 50 MG/5ML liquid 100 mg  100 mg Per Tube BID Phillips Climes, MD   100 mg at 05/08/14 2139  . enoxaparin (LOVENOX) injection 40 mg  40 mg Subcutaneous Q24H Nishant Dhungel, MD   40 mg at 05/08/14 2006  . feeding supplement (JEVITY 1.2 CAL) liquid 1,000 mL  1,000 mL Per Tube Continuous Phillips Climes, MD 75 mL/hr at 05/08/14 0806 1,000 mL at 05/08/14 0806  . hydrALAZINE (APRESOLINE) injection 10 mg  10 mg Intravenous Q4H PRN Caren Griffins, MD   10 mg at 05/05/14 2112  . Immune Globulin 10% SOLN 30 g  400 mg/kg Intravenous Q24H Phillips Climes, MD   30 g at 05/08/14 1159  . Influenza vac split quadrivalent PF (FLUARIX) injection 0.5 mL  0.5 mL Intramuscular Tomorrow-1000 Nishant Dhungel, MD   0.5 mL at 05/04/14 1411  . insulin aspart (novoLOG) injection 0-15 Units  0-15 Units Subcutaneous Q6H Phillips Climes, MD   3 Units at 05/09/14 [phone]  . loratadine (CLARITIN) tablet 10 mg  10 mg Per Tube Lavinia Sharps, MD   10 mg at 05/09/14 2229  .  metoprolol (LOPRESSOR) tablet 50 mg  50 mg Per Tube BID Phillips Climes, MD   50 mg at 05/08/14 2144  . miconazole (MICOTIN) 2 % cream 1 application  1 application Topical BID Nishant Dhungel, MD   1 application at 73/53/29 2200  . nystatin (MYCOSTATIN) 100000 UNIT/ML suspension 500,000 Units  5 mL Oral QID Nishant Dhungel, MD   500,000 Units at 05/08/14 2139  . ondansetron (ZOFRAN) tablet 4 mg  4 mg Oral Q6H PRN Nishant Dhungel, MD       Or  . ondansetron (ZOFRAN) injection 4 mg  4 mg Intravenous Q6H PRN Nishant Dhungel, MD      . ondansetron (ZOFRAN) tablet 8 mg  8 mg Oral Q12H Nishant Dhungel, MD   8 mg at 05/08/14 2139  . piperacillin-tazobactam (ZOSYN) IVPB 3.375 g  3.375 g Intravenous Q8H Dawood Elgergawy, MD      . polyethylene glycol (MIRALAX / GLYCOLAX) packet 17 g  17 g Per Tube Daily  Phillips Climes, MD   17 g at 05/08/14 1032  . predniSONE (DELTASONE) tablet 10 mg  10 mg Per Tube Q breakfast Phillips Climes, MD   10 mg at 05/09/14 0834  . RESOURCE THICKENUP CLEAR   Oral PRN Baird Lyons, RD      . sodium chloride 0.9 % injection 10-40 mL  10-40 mL Intracatheter PRN Phillips Climes, MD   10 mL at 05/08/14 0705  . sodium chloride 0.9 % injection 3 mL  3 mL Intravenous Q12H Nishant Dhungel, MD   3 mL at 05/08/14 2200  . traMADol (ULTRAM) tablet 100 mg  100 mg Per Tube Q6H PRN Phillips Climes, MD      . vancomycin (VANCOCIN) 1,250 mg in sodium chloride 0.9 % 250 mL IVPB  1,250 mg Intravenous Once Phillips Climes, MD      . vancomycin (VANCOCIN) IVPB 750 mg/150 ml premix  750 mg Intravenous Q12H Phillips Climes, MD        PE: General appearance: alert, cooperative and no distress Lungs: clear to auscultation bilaterally Heart: irregularly irregular rhythm Extremities: No LEE Pulses: 2+ and symmetric Skin: Warm and dry Neurologic: Grossly normal  Lab Results:   Recent Labs  05/07/14 0306 05/08/14 0500 05/09/14 0820  WBC 17.2* 12.5* 14.2*  HGB 10.1* 9.4* 10.0*  HCT 31.0* 29.0* 29.8*  PLT 221 175 182   BMET  Recent Labs  05/07/14 0306 05/08/14 0500  NA 136 132*  K 3.7 3.3*  CL 103 95*  CO2 29 32  GLUCOSE 139* 149*  BUN 21 12  CREATININE 0.67 0.53  CALCIUM 8.4 7.8*     Assessment/Plan  Active Problems:   Diabetes mellitus without complication   DLBCL (diffuse large B cell lymphoma)   CKD (chronic kidney disease), stage III   Mucositis due to antineoplastic therapy   Anemia in neoplastic disease   Protein-calorie malnutrition, severe   Fall at home   Weakness generalized   Right hemiparesis   PAF (paroxysmal atrial fibrillation)   Oropharyngeal dysphagia   Feeding difficulties   Plan:  Back in Afib RVR around 2100 hrs last night.  He is back on IV cardizem 15mg /hr.  Also on lopressor 50mg .  Looks like he converted to NSR last time  after flecainide 300mg  on 1/22.  He was then put on 30mg  dilt suspension Q6.   Echo: normal EF, mild LVH, G1DD.  His rate appears to be improving.  Continue to monitor on the cardizem.  Hopefully he will  convert and we can put on a higher dose of PO cardizem.   Check potassium as it was a little low yesterday.  He did receive 52meq yesterday.  WBC elevation thought to be from steroids.    LOS: 7 days    HAGER, BRYAN PA-C 05/09/2014 10:44 AM   Agree with note written by Luisa Dago PAC  Back in Afib now with CVR on IV dilt. Not an [place] candidate. 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 Nothing further to add at this time. Would recommend Palliative care consult. Will be available for further questions.   Lorretta Harp 05/09/2014 1:27 PM

## 2014-05-09 NOTE — Progress Notes (Signed)
Patient had brady episode HR=48 per CMT at 1235.  IV cardizem decreased to 5mg /hr

## 2014-05-09 NOTE — Progress Notes (Signed)
PROGRESS NOTE  Erik Ramirez AYT:[id number] DOB: 01-31-1926 DOA: 05/02/2014 PCP: Pcp Not In System  HPI: 81 year old male with history of diffuse large B-cell lymphoma on chemotherapy, diabetes mellitus, paroxysmal A. fib, hypertension, obstructive sleep apnea was recently hospitalized for neutropenic fever and anemia, elevated troponin due to demand ischemia, and was discharged home with home health. Patient reports that he has been feeling weak since his discharge and not having enough strength in his legs. He fell in his bathroom about 10 days back and says that his legs gave away. He thinks he may have hit his right scapula, hip and right knee and leg. Since then he has been progressively feeling weak and having pain in his right shoulder, hips and the right leg. For past 2-3 days he reports that his right leg feels like "lead ". He denies any syncope, headache or dizziness. He denies any tingling or numbness of his extremities. Denies any bowel or urinary symptoms. Denies any blurred vision. He reports poor appetite and difficulty swallowing due to oral thrush. Patient denies fever, chills, nausea , vomiting, chest pain, palpitations, SOB, abdominal pain, bowel or urinary symptoms. As reported that his right hand has been week for past 1 week. Pt had presented for outpatient swallow eval today and was referred to ED given complains of right-sided weakness.  Hospital course  Patient admitted for further workup, MRI brain, MRI cervical spine, did not show any acute findings, no evidence of acute CVA, or metastatic disease,  patient's symptoms most likely to generalized deconditioning, patient did fail swallow evaluation initially, but on repeat evaluation he was advanced to dysphagia 2 diet, unfortunately to be downgraded again to nothing by mouth after he failed another modified barium swallow on 1/22, NG tube was inserted, and started on tube feeds 1/23, patient continues to have weakness, and  deconditioning, paraneoplastic syndrome was being entertained, so patient was started on IVIG trial, pending Myasthenia  gravis workup.   - Patient has known history of A. fib, went into A. fib with RVR on 1/22, where he was started on Cardizem drip. Cardiology were consulted, where they adjusted his Cardizem dosing and metoprolol dosing, and give him one dose of flecainide, which converted back to normal sinus rhythm, but unfortunately patient again into A. fib with RVR on 1/25, where he is back on Cardizem drip. - Patient continues to have gradual increase in his oxygen demand, chest x-ray was obtained, which did show bibasilar infiltrate, for which he was started on IV vancomycin and Zosyn for HCAP / aspiration PNA. Subjective / 24 H Interval events No  significant events overnight, no fever, no cough, no chest pain, no shortness of breath.  Assessment/Plan: Active Problems:   Diabetes mellitus without complication   DLBCL (diffuse large B cell lymphoma)   CKD (chronic kidney disease), stage III   Mucositis due to antineoplastic therapy   Anemia in neoplastic disease   Protein-calorie malnutrition, severe   Fall at home   Weakness generalized   Right hemiparesis   PAF (paroxysmal atrial fibrillation)   Oropharyngeal dysphagia   Feeding difficulties   weakness - Neurology on board. Symptoms felt more likely secondary to generalized weakness , cachectic neuropathy ,in the absence of significant finding on the imaging, as no metastatic disease or acute CVA was on imaging, patient has progressive weakness, so paraneoplastic syndrome is being considered, myasthenia  gravis workup still pending, discussed with neurology, patient's family, started patient on IVIG empirically 1/23. - MRI brain is negative, -  MRI C spine showing Multilevel cervical spondylosis, worst at C5-6 where there is mild spinal stenosis, discussed with neurosurgery on call Dr. Ronnald Ramp,  these findings wouldn't cause the  patient symptoms. - Discussed with oncology on call, CNS involvement is unlikely with B-cell lymphoma, no evidence of metastatic is on imaging, requested  oncology consult. - Carotid Doppler no significant stenosis - 2-D echo showing EF of 55% with grade 1 diastolic dysfunction. - PT/OT .  A. fib with RVR - Patient on metoprolol and Cardizem, back on Cardizem drip 1/25. - Cardiology consult appreciated, given flecainide 1 dose on 1/22 which converted back to normal sinus rhythm. - Not and the coagulation candidate given his lymphoma, age, severe deconditioning, and chemotherapy, and history of thrombocytopenia.  HCAP/ aspiration pneumonia - Chest x-ray showing bibasilar densities, more pronounced in the right. - We'll start on IV vancomycin and Zosyn   Dysphagia - SLP evaluation appreciated, patient failed another in MBS evaluation . - Gastroenterology input appreciated, dysphagia felt not a primary esophageal process, and most likely secondary to generalized weakness and deconditioning. - Started on tube feeds via NG tube.  Right shoulder and hip pain - Secondary to fall. No fractures on XR  Leukocytosis - Most likely related to steroids, around baseline, x-ray showing no opacity or infiltrate, urinalysis is  Negative 1/18 - Continue to monitor  DLBCL (diffuse large B cell lymphoma)  - Following with Dr. Jana Hakim as outpatient and on chemotherapy as an outpatient, chemotherapy currently on hold given patient poor functional status. - REQUESTED oncology consult.  Diabetes mellitus type 2  CBG is acceptable continue SSI  CKD (chronic kidney disease), stage III  Renal function stable.   Mucositis due to antineoplastic therapy  - Treated with Diflucan. - Continue with nystatin.  Anemia in neoplastic disease  Monitor H&H  Protein-calorie malnutrition, severe  conitnue supplements, on tube feeds,   HTN - Labile, but generally acceptable  -continue home medications, and  hydralazine PRN  Diet: Diet NPO time specified Except for: Ice Chips Fluids: none DVT Prophylaxis: Lovenox  Code Status: DNR Family Communication: Discussed with son 05/08/14. adisposition plan: Remains inpatient.   Consults Neurology  Cardiology Gastroenterology Palliative care consult requested but HBO a declined palliative care consult.  Procedures:  None    Antibiotics  Anti-infectives    Start     Dose/Rate Route Frequency Ordered Stop   05/09/14 2200  vancomycin (VANCOCIN) IVPB 750 mg/150 ml premix     750 mg150 mL/hr over 60 Minutes Intravenous Every 12 hours 05/09/14 0942     05/09/14 0945  piperacillin-tazobactam (ZOSYN) IVPB 3.375 g     3.375 g12.5 mL/hr over 240 Minutes Intravenous Every 8 hours 05/09/14 0939     05/09/14 0945  vancomycin (VANCOCIN) 1,250 mg in sodium chloride 0.9 % 250 mL IVPB     1,250 mg166.7 mL/hr over 90 Minutes Intravenous  Once 05/09/14 0942     05/02/14 1830  fluconazole (DIFLUCAN) tablet 100 mg  Status:  Discontinued     100 mg Oral Daily 05/02/14 1821 05/07/14 0701       Studies  Dg Chest Port 1 View  05/09/2014   CLINICAL DATA:  Hypoxia. History of lymphoma and obstructive sleep apnea.  EXAM: PORTABLE CHEST - 1 VIEW  COMPARISON:  05/08/2014  FINDINGS: Port-A-Cath tip near the superior cavoatrial junction. Nasogastric tube extends into the abdomen. Increased hazy densities in the right lower lung. Persistent densities at the left lung base. Upper lungs are clear without  edema. Heart size is normal.  IMPRESSION: Bibasilar densities with increased disease on the right side. Findings could represent a combination of airspace disease and pleural effusions.   Electronically Signed   By: Markus Daft M.D.   On: 05/09/2014 08:26   Dg Chest Port 1 View  05/08/2014   CLINICAL DATA:  Hypoxia  EXAM: PORTABLE CHEST - 1 VIEW  COMPARISON:  05/04/2014  FINDINGS: Cardiac shadow is stable. The lungs are well aerated bilaterally. Bibasilar infiltrative  changes seen slightly worse on the right than the left. A right-sided chest wall port is again noted.  IMPRESSION: Bibasilar infiltrates new from the prior study.   Electronically Signed   By: Inez Catalina M.D.   On: 05/08/2014 08:09    Objective  Filed Vitals:   05/09/14 0415 05/09/14 0500 05/09/14 0615 05/09/14 1119  BP: 157/81  147/86   Pulse: 37  57 107  Temp:      TempSrc:      Resp:      Height:      Weight:  69.8 kg (153 lb 14.1 oz)    SpO2: 100%  98%    Exam:  General:  NAD, awake alert  HEENT: no scleral icterus  Cardiovascular: RRR without MRG  Respiratory: CTA biL, no wheezing  Abdomen: soft, non tender  MSK/Extremities: no clubbing or cyanosis   Skin: no rashes  Neuro: Generalized weakness, weakness in proximal muscles in lower extremity remains the same.  Data Reviewed: Basic Metabolic Panel:  Recent Labs Lab 05/03/14 0559 05/04/14 0615 05/05/14 0530 05/07/14 0306 05/08/14 0500 05/08/14 0730  NA 136 135 135 136 132*  --   K 3.8 3.9 4.2 3.7 3.3*  --   CL 96 95* 96 103 95*  --   CO2 31 31 31 29  32  --   GLUCOSE 143* 152* 141* 139* 149*  --   BUN 18 15 20 21 12   --   CREATININE 0.76 0.69 0.90 0.67 0.53  --   CALCIUM 9.0 9.0 9.0 8.4 7.8*  --   PHOS  --   --   --   --   --  2.9   Liver Function Tests:  Recent Labs Lab 05/02/14 1316  AST 25  ALT 14  ALKPHOS 72  BILITOT 0.5  PROT 6.2  ALBUMIN 2.6*   CBC:  Recent Labs Lab 05/02/14 1316  05/04/14 0615 05/05/14 0530 05/07/14 0306 05/08/14 0500 05/09/14 0820  WBC 14.5*  < > 16.8* 16.0* 17.2* 12.5* 14.2*  NEUTROABS 12.1*  --   --   --   --   --   --   HGB 12.5*  < > 12.3* 12.1* 10.1* 9.4* 10.0*  HCT 37.1*  < > 36.4* 36.8* 31.0* 29.0* 29.8*  MCV 82.8  < > 82.2 85.0 86.6 82.9 83.0  PLT 303  < > 313 280 221 175 182  < > = values in this interval not displayed. BNP (last 3 results)  Recent Labs  01/05/14 2142  PROBNP 343.7   CBG:  Recent Labs Lab 05/08/14 0900 05/08/14 1620  05/08/14 2110 05/09/14 0341 05/09/14 0830  GLUCAP 136* 164* 125* 170* 179*   Scheduled Meds: . allopurinol  300 mg Per Tube Daily  . antiseptic oral rinse  7 mL Mouth Rinse q12n4p  . aspirin  325 mg Per Tube Daily  . atorvastatin  10 mg Per Tube q1800  . chlorhexidine  15 mL Mouth Rinse BID  . diltiazem  30  mg Per Tube 4 times per day  . docusate  100 mg Per Tube BID  . enoxaparin (LOVENOX) injection  40 mg Subcutaneous Q24H  . IMMUNE GLOBULIN 10% (HUMAN) IV - For Fluid Restriction Only  400 mg/kg Intravenous Q24H  . Influenza vac split quadrivalent PF  0.5 mL Intramuscular Tomorrow-1000  . insulin aspart  0-15 Units Subcutaneous Q6H  . loratadine  10 mg Per Tube BH-q7a  . metoprolol tartrate  50 mg Per Tube BID  . miconazole  1 application Topical BID  . nystatin  5 mL Oral QID  . ondansetron  8 mg Oral Q12H  . piperacillin-tazobactam (ZOSYN)  IV  3.375 g Intravenous Q8H  . polyethylene glycol  17 g Per Tube Daily  . predniSONE  10 mg Per Tube Q breakfast  . sodium chloride  3 mL Intravenous Q12H  . vancomycin  1,250 mg Intravenous Once  . vancomycin  750 mg Intravenous Q12H   Continuous Infusions: . sodium chloride 75 mL/hr at 05/08/14 2257  . diltiazem (CARDIZEM) infusion 15 mg/hr (05/09/14 1118)  . feeding supplement (JEVITY 1.2 CAL) 1,000 mL (05/08/14 0806)   Time spent: 30 minutes  Phillips Climes , MD [facility] Pager [phone] If 7 PM - 7 AM, please contact night-coverage at [email or web address], password [facility] 05/09/2014, 12:04 PM  LOS: 7 days

## 2014-05-09 NOTE — Progress Notes (Signed)
Pts Hr creeping back up and sustaining mainly 120-140's. BP 150/100's.Lamar Blinks, NP notified and orders received. Pt medicated with 2.5mg  of IV metoprolol. Will cont to monitor.

## 2014-05-09 NOTE — Consult Note (Signed)
[facility] CONSULT NOTE  Patient Care Team: Pcp Not In System as PCP - General  CHIEF COMPLAINTS/PURPOSE OF CONSULTATION:  Diffuse large B-cell lymphoma, generalized weakness and fatigue, neurological problems  HISTORY OF PRESENTING ILLNESS:  Erik Ramirez 81 y.o. male has a diagnosis of diffuse large B-cell lymphoma that was treated by Dr. Jana Hakim 5 cycles of CHOP. He could not tolerate the sixth cycle and underwent a PET/CT scan in November 2015 which showed improvement in his lymphoma but there was still residual disease in his abdomen area. Dr. Jana Hakim .if he would get better he could treat him with maintenance rituximab. In the interim he has been admitted to the hospital since 05/02/2014 with multiple problems including severe generalized weakness. Multiple tests have been done including a brain MRI which did not show any evidence of stroke. 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 In the interim he has not been getting any better and progressively getting weaker and weaker. He had an NG tube feeding which he pulled out today. His mental status has not improved and he continues to have garbled speech. He does follow commands. But he has not been able to converse. He can move his legs slightly. His wife and his 2 sons are in the room.  MEDICAL HISTORY:  Past Medical History  Diagnosis Date  . Hypertension   . Hyperlipemia   . Anxiety   . Lymphadenopathy, abdominal   . Urethral stricture   . Anemia   . BPPV (benign paroxysmal positional vertigo)   . Lymphoma   . Diabetes mellitus without complication   . Obstructive sleep apnea     uses cpap at home  . Sleep apnea     SURGICAL HISTORY: Past Surgical History  Procedure Laterality Date  . Back surgery    . Prostate surgery    . Tonsillectomy      SOCIAL HISTORY: History   Social History  . Marital Status: Married    Spouse Name: N/A    Number of Children:  N/A  . Years of Education: N/A   Occupational History  . Not on file.   Social History Main Topics  . Smoking status: Former Smoker -- 30 years    Quit date: 04/12/1968  . Smokeless tobacco: Never Used  . Alcohol Use: No  . Drug Use: No  . Sexual Activity: No   Other Topics Concern  . Not on file   Social History Narrative    FAMILY HISTORY: No family history on file.  ALLERGIES:  is allergic to rituximab; caffeine; morphine and related; and penicillins.  MEDICATIONS:  Current Facility-Administered Medications  Medication Dose Route Frequency Provider Last Rate Last Dose  . acetaminophen (TYLENOL) tablet 650 mg  650 mg Oral Q6H PRN Nishant Dhungel, MD   650 mg at 05/04/14 2351   Or  . acetaminophen (TYLENOL) suppository 650 mg  650 mg Rectal Q6H PRN Nishant Dhungel, MD      . allopurinol (ZYLOPRIM) tablet 300 mg  300 mg Per Tube Daily Phillips Climes, MD   300 mg at 05/09/14 1126  . antiseptic oral rinse (CPC / CETYLPYRIDINIUM CHLORIDE 0.05%) solution 7 mL  7 mL Mouth Rinse q12n4p Nishant Dhungel, MD   7 mL at 05/09/14 1202  . aspirin tablet 325 mg  325 mg Per Tube Daily Phillips Climes, MD   325 mg at 05/09/14 1126  . atorvastatin (LIPITOR) tablet 10 mg  10 mg Per Tube q1800  Phillips Climes, MD   10 mg at 05/08/14 1701  . chlorhexidine (PERIDEX) 0.12 % solution 15 mL  15 mL Mouth Rinse BID Phillips Climes, MD   15 mL at 05/09/14 3151  . clorazepate (TRANXENE) tablet 7.5 mg  7.5 mg Per Tube BID PRN Phillips Climes, MD   7.5 mg at 05/09/14 0113  . diltiazem (CARDIZEM) 100 mg in dextrose 5 % 100 mL (1 mg/mL) infusion  5-15 mg/hr Intravenous Titrated Jacolyn Reedy, MD 5 mL/hr at 05/09/14 1244 5 mg/hr at 05/09/14 1244  . docusate (COLACE) 50 MG/5ML liquid 100 mg  100 mg Per Tube BID Phillips Climes, MD   100 mg at 05/09/14 1127  . enoxaparin (LOVENOX) injection 40 mg  40 mg Subcutaneous Q24H Nishant Dhungel, MD   40 mg at 05/08/14 2006  . feeding supplement (JEVITY 1.2  CAL) liquid 1,000 mL  1,000 mL Per Tube Continuous Phillips Climes, MD 75 mL/hr at 05/08/14 0806 1,000 mL at 05/08/14 0806  . hydrALAZINE (APRESOLINE) injection 10 mg  10 mg Intravenous Q4H PRN Caren Griffins, MD   10 mg at 05/05/14 2112  . Immune Globulin 10% SOLN 30 g  400 mg/kg Intravenous Q24H Phillips Climes, MD   30 g at 05/08/14 1159  . Influenza vac split quadrivalent PF (FLUARIX) injection 0.5 mL  0.5 mL Intramuscular Tomorrow-1000 Nishant Dhungel, MD   0.5 mL at 05/04/14 1411  . insulin aspart (novoLOG) injection 0-15 Units  0-15 Units Subcutaneous Q6H Phillips Climes, MD   3 Units at 05/09/14 [phone]  . lactobacillus acidophilus (BACID) tablet 2 tablet  2 tablet Per Tube QID Phillips Climes, MD      . loratadine (CLARITIN) tablet 10 mg  10 mg Per Tube Lavinia Sharps, MD   10 mg at 05/09/14 0737  . metoprolol (LOPRESSOR) tablet 50 mg  50 mg Per Tube BID Phillips Climes, MD   50 mg at 05/09/14 1119  . miconazole (MICOTIN) 2 % cream 1 application  1 application Topical BID Nishant Dhungel, MD   1 application at 10/62/69 1128  . nystatin (MYCOSTATIN) 100000 UNIT/ML suspension 500,000 Units  5 mL Oral QID Nishant Dhungel, MD   500,000 Units at 05/09/14 1529  . ondansetron (ZOFRAN) tablet 4 mg  4 mg Oral Q6H PRN Nishant Dhungel, MD       Or  . ondansetron (ZOFRAN) injection 4 mg  4 mg Intravenous Q6H PRN Nishant Dhungel, MD      . ondansetron (ZOFRAN) tablet 8 mg  8 mg Oral Q12H Nishant Dhungel, MD   8 mg at 05/09/14 1126  . piperacillin-tazobactam (ZOSYN) IVPB 3.375 g  3.375 g Intravenous Q8H Dawood Elgergawy, MD   3.375 g at 05/09/14 1125  . polyethylene glycol (MIRALAX / GLYCOLAX) packet 17 g  17 g Per Tube Daily Phillips Climes, MD   17 g at 05/09/14 1127  . predniSONE (DELTASONE) tablet 10 mg  10 mg Per Tube Q breakfast Phillips Climes, MD   10 mg at 05/09/14 0834  . RESOURCE THICKENUP CLEAR   Oral PRN Baird Lyons, RD      . sodium chloride 0.9 % injection 10-40 mL   10-40 mL Intracatheter PRN Phillips Climes, MD   10 mL at 05/08/14 0705  . sodium chloride 0.9 % injection 3 mL  3 mL Intravenous Q12H Nishant Dhungel, MD   3 mL at 05/09/14 1128  . traMADol (ULTRAM) tablet 100 mg  100 mg Per Tube Q6H PRN Phillips Climes,  MD      . Derrill Memo ON 05/10/2014] vancomycin (VANCOCIN) IVPB 750 mg/150 ml premix  750 mg Intravenous Q12H Phillips Climes, MD        REVIEW OF SYSTEMS:   Generalized fatigue and weakness  Confusion, difficulty with speech Weakness of his arms and legs Wants to pull out pulse oximeter  PHYSICAL EXAMINATION: ECOG PERFORMANCE STATUS: 4 - Bedbound  Filed Vitals:   05/09/14 1210  BP: 117/86  Pulse: 80  Temp: 98.7 F (37.1 C)  Resp: 20   Filed Weights   05/07/14 0336 05/08/14 0427 05/09/14 0500  Weight: 152 lb 12.8 oz (69.31 kg) 153 lb 8 oz (69.627 kg) 153 lb 14.1 oz (69.8 kg)    GENERAL:alert, but confused and disoriented not answering questions LUNGS: Crackles at lung bases HEART: Irregular ABDOMEN:abdomen soft, non-tender and normal bowel sounds NEURO: Upper and lower extremity strength to 2-3/5  LABORATORY DATA:  I have reviewed the data as listed Lab Results  Component Value Date   WBC 14.2* 05/09/2014   HGB 10.0* 05/09/2014   HCT 29.8* 05/09/2014   MCV 83.0 05/09/2014   PLT 182 05/09/2014   Lab Results  Component Value Date   NA 132* 05/08/2014   K 3.3* 05/08/2014   CL 95* 05/08/2014   CO2 32 05/08/2014    ASSESSMENT AND PLAN:  1. Diffuse large B-cell lymphoma: Residual disease after 5 cycles of chemotherapy. I discussed with the patient's family that given his advanced age and is rapidly declining performance status and his inability to improve and his mental functions, underlying risk of infections motor and sensory weakness in addition to multiple chronic medical problems including diabetes, hypertension and hyperlipidemia make it very difficult to hope for recovery from his illnesses.  I believe it is  futile to continue with aggressive measures I recommended hospice care for the patient and we will request palliative care to discuss different options.. Given this change in goals of care, I believe all aggressive measures may need to be discontinued with the whole focus to remain on comfort care. I offered support and assistance to other family in this difficult time. Thank you very much for your excellent care and involving [place] in his care team.   Rulon Eisenmenger, MD 4:43 PM

## 2014-05-09 NOTE — Progress Notes (Signed)
Physical Therapy Treatment Patient Details Name: Erik Ramirez MRN: [id number] DOB: 1925-11-21 Today's Date: 05/09/2014    History of Present Illness Adm 05/02/14 with Rt sided weakness. Pt with multiple falls since d/c home from hospital early January (last fell 1/10 in bathroom, per son) Xrays Rt shoulder, pelvis/hips negative; MRI brain negative. MRI cervical spine +mild spinal stenosis C5-6 PMHx- FTT post chemotherapy treatments for lymphoma, DM, afib, HTN, BPPV    PT Comments    Pt is limited today by increased confusion (per son report).  He has significant weakness in bil legs and needs two person max assist to even attempt standing or sitting EOB.  He has right arm and hand weakness and lack of coordination, but not mirrored right leg lack of coordination (just weakness).  Bil legs are weaker at the knees and ankles than the hips and he is able to initiate standing, but unable to power up over very weak bil legs even with two person assist bil hands on RW and bil knees blocked.  I anticipate he will have a very long recovery and will need extensive therapy.  He was working with [place] just prior to admission this time after being admitted to the hospital for 8 days earlier this year.  He started off weak and now his illness has really gotten him immobilized.  I anticipate that he will nee SNF level rehab due to the extensive amount of time I think his recovery will take.  CIR has been consulted, so we will follow up with their recommendations.   Follow Up Recommendations  SNF     Equipment Recommendations  None recommended by PT    Recommendations for Other Services   NA     Precautions / Restrictions Precautions Precautions: Fall Precaution Comments: pt restless and trying to move his legs out of bed all day per family report.  Also, pulling at lines.     Mobility  Bed Mobility Overal bed mobility: Needs Assistance Bed Mobility: Supine to Sit;Sit to Supine Rolling: +2 for  physical assistance;Max assist   Supine to sit: +2 for physical assistance;Max assist Sit to supine: +2 for physical assistance;Max assist   General bed mobility comments: Two person max assist needed to mobilize EOB and back into bed.  Max assist provided of both lower extremities and pt attempting to pull with both upper extremities to get to sitting, but doesn't have enough arm or trunk strength to successfully get to upright posture.   Transfers Overall transfer level: Needs assistance Equipment used: Rolling walker (2 wheeled) Transfers: Sit to/from Stand           General transfer comment: RN advised to stay in bed due to safety (too restless) and about to start IVIG.  Sit to stand attempted x 2 from bed with bil knees locked and bil upper extremities supported on RW.  Pt able to initiate trunk movement, but has very little if any strength in bil legs. Even with knees blocked he was unable to power up to standing with RW and two person assist. He was much more successful last session with the steady standing frame.       Balance Overall balance assessment: Needs assistance Sitting-balance support: Bilateral upper extremity supported;Feet supported Sitting balance-Leahy Scale: Poor Sitting balance - Comments: min assist in sitting to ensure no posterior or anterior LOB.    Standing balance support: Bilateral upper extremity supported Standing balance-Leahy Scale: Zero Standing balance comment: unable to stand without two person max  assist and even then did not get to full standing.                     Cognition Arousal/Alertness: Awake/alert Behavior During Therapy: Restless Overall Cognitive Status: Impaired/Different from baseline Area of Impairment: Orientation;Attention;Memory;Following commands;Safety/judgement;Awareness;Problem solving Orientation Level: Disoriented to;Person Current Attention Level: Sustained Memory: Decreased short-term memory Following  Commands: Follows one step commands inconsistently Safety/Judgement: Decreased awareness of safety;Decreased awareness of deficits Awareness: Intellectual Problem Solving: Difficulty sequencing;Requires verbal cues;Requires tactile cues General Comments: Per pt's son, his cognition is worst today than previous days. more confused, restless.     Exercises Other Exercises Other Exercises: Attempted seated bil LE exercises, but pt having difficulty following commands for exercises.  He was able to complete seated marches with max verbal and visual cues. Unable to preform LAQs.         Pertinent Vitals/Pain Pain Assessment: No/denies pain           PT Goals (current goals can now be found in the care plan section) Acute Rehab PT Goals Patient Stated Goal: unable to state.  Progress towards PT goals: Not progressing toward goals - comment (increased confusion today)    Frequency  Min 2X/week    PT Plan Discharge plan needs to be updated;Frequency needs to be updated       End of Session Equipment Utilized During Treatment: Gait belt Activity Tolerance: Patient limited by fatigue Patient left: in bed;with call bell/phone within reach     Time: 7017-7939 PT Time Calculation (min) (ACUTE ONLY): 22 min  Charges:  $Therapeutic Activity: 8-22 mins                      Koral Thaden B. [place], [place], DPT [phone]   05/09/2014, 4:57 PM

## 2014-05-10 LAB — GLUCOSE, CAPILLARY: Glucose-Capillary: 113 mg/dL — ABNORMAL HIGH (ref 70–99)

## 2014-05-10 MED ORDER — POLYVINYL ALCOHOL 1.4 % OP SOLN
1.0000 [drp] | Freq: Four times a day (QID) | OPHTHALMIC | Status: DC | PRN
  Filled 2014-05-10: qty 15

## 2014-05-10 MED ORDER — POLYVINYL ALCOHOL 1.4 % OP SOLN: 1.0000 [drp] | Freq: Four times a day (QID) | OPHTHALMIC | Status: AC | PRN

## 2014-05-10 MED ORDER — ATROPINE SULFATE 1 % OP SOLN: 4.0000 [drp] | OPHTHALMIC | Status: AC | PRN

## 2014-05-10 MED ORDER — ACETAMINOPHEN 650 MG RE SUPP: 650.0000 mg | Freq: Four times a day (QID) | RECTAL | Status: DC | PRN

## 2014-05-10 MED ORDER — DIPHENHYDRAMINE HCL 50 MG/ML IJ SOLN: 12.5000 mg | INTRAMUSCULAR | Status: AC | PRN

## 2014-05-10 MED ORDER — FENTANYL CITRATE 0.05 MG/ML IJ SOLN: 25.0000 ug | INTRAMUSCULAR | Status: AC | PRN

## 2014-05-10 MED ORDER — LORAZEPAM 2 MG/ML IJ SOLN
1.0000 mg | Freq: Once | INTRAMUSCULAR | Status: AC
  Administered 2014-05-10: 1 mg via INTRAVENOUS
  Filled 2014-05-10: qty 1

## 2014-05-10 MED ORDER — SCOPOLAMINE 1 MG/3DAYS TD PT72
1.0000 | MEDICATED_PATCH | TRANSDERMAL | Status: DC
  Administered 2014-05-10: 1.5 mg via TRANSDERMAL
  Filled 2014-05-10: qty 1

## 2014-05-10 MED ORDER — ATROPINE SULFATE 1 % OP SOLN
4.0000 [drp] | OPHTHALMIC | Status: DC | PRN
  Filled 2014-05-10: qty 2

## 2014-05-10 MED ORDER — HALOPERIDOL LACTATE 2 MG/ML PO CONC: 1.0000 mg | Freq: Four times a day (QID) | ORAL | Status: AC | PRN

## 2014-05-10 MED ORDER — FENTANYL CITRATE 0.05 MG/ML IJ SOLN: 25.0000 ug | INTRAMUSCULAR | Status: DC | PRN

## 2014-05-10 MED ORDER — ONDANSETRON HCL 4 MG/2ML IJ SOLN: 4.0000 mg | Freq: Four times a day (QID) | INTRAMUSCULAR | Status: AC | PRN

## 2014-05-10 MED ORDER — DIPHENHYDRAMINE HCL 50 MG/ML IJ SOLN: 12.5000 mg | INTRAMUSCULAR | Status: DC | PRN

## 2014-05-10 MED ORDER — LORAZEPAM 2 MG/ML PO CONC: 1.0000 mg | ORAL | Status: DC | PRN

## 2014-05-10 MED ORDER — SCOPOLAMINE 1 MG/3DAYS TD PT72: 1.0000 | MEDICATED_PATCH | TRANSDERMAL | Status: AC

## 2014-05-10 MED ORDER — HALOPERIDOL LACTATE 2 MG/ML PO CONC
1.0000 mg | Freq: Four times a day (QID) | ORAL | Status: DC | PRN
  Administered 2014-05-10: 1 mg via ORAL
  Filled 2014-05-10 (×2): qty 0.5

## 2014-05-10 MED ORDER — CHLORHEXIDINE GLUCONATE 0.12 % MT SOLN: 15.0000 mL | Freq: Two times a day (BID) | OROMUCOSAL | Status: AC

## 2014-05-10 MED ORDER — ONDANSETRON HCL 4 MG PO TABS: 4.0000 mg | ORAL_TABLET | Freq: Four times a day (QID) | ORAL | Status: DC | PRN

## 2014-05-10 MED ORDER — CLORAZEPATE DIPOTASSIUM 7.5 MG PO TABS: 7.5000 mg | ORAL_TABLET | Freq: Two times a day (BID) | ORAL | Status: AC | PRN

## 2014-05-10 MED ORDER — LORAZEPAM 2 MG/ML PO CONC: 1.0000 mg | ORAL | Status: AC | PRN

## 2014-05-10 MED ORDER — ONDANSETRON HCL 4 MG/2ML IJ SOLN: 4.0000 mg | Freq: Four times a day (QID) | INTRAMUSCULAR | Status: DC | PRN

## 2014-05-10 MED ORDER — ACETAMINOPHEN 325 MG PO TABS: 650.0000 mg | ORAL_TABLET | Freq: Four times a day (QID) | ORAL | Status: DC | PRN

## 2014-05-10 MED ORDER — CETYLPYRIDINIUM CHLORIDE 0.05 % MT LIQD: 7.0000 mL | Freq: Two times a day (BID) | OROMUCOSAL | Status: AC

## 2014-05-10 MED ORDER — ACETAMINOPHEN 650 MG RE SUPP: 650.0000 mg | Freq: Four times a day (QID) | RECTAL | Status: AC | PRN

## 2014-05-10 NOTE — Progress Notes (Deleted)
PROGRESS NOTE  Lisa Blakeman HYQ:[id number] DOB: 1925-09-26 DOA: 05/02/2014 PCP: Pcp Not In System  HPI: 81 year old male with history of diffuse large B-cell lymphoma on chemotherapy, diabetes mellitus, paroxysmal A. fib, hypertension, obstructive sleep apnea was recently hospitalized for neutropenic fever and anemia, elevated troponin due to demand ischemia, and was discharged home with home health. Patient reports that he has been feeling weak since his discharge and not having enough strength in his legs. He fell in his bathroom about 10 days back and says that his legs gave away. He thinks he may have hit his right scapula, hip and right knee and leg. Since then he has been progressively feeling weak and having pain in his right shoulder, hips and the right leg. For past 2-3 days he reports that his right leg feels like "lead ". He denies any syncope, headache or dizziness. He denies any tingling or numbness of his extremities. Denies any bowel or urinary symptoms. Denies any blurred vision. He reports poor appetite and difficulty swallowing due to oral thrush. Patient denies fever, chills, nausea , vomiting, chest pain, palpitations, SOB, abdominal pain, bowel or urinary symptoms. As reported that his right hand has been week for past 1 week. Pt had presented for outpatient swallow eval today and was referred to ED given complains of right-sided weakness.  Hospital course  Patient admitted for further workup, MRI brain, MRI cervical spine, did not show any acute findings, no evidence of acute CVA, or metastatic disease,  patient's symptoms most likely to generalized deconditioning, patient did fail swallow evaluation initially, but on repeat evaluation he was advanced to dysphagia 2 diet, unfortunately to be downgraded again to nothing by mouth after he failed another modified barium swallow on 1/22, NG tube was inserted, and started on tube feeds 1/23, patient continues to have weakness, and  deconditioning, paraneoplastic syndrome was being entertained, so patient was started on IVIG trial, pending Myasthenia  gravis workup.   - Patient has known history of A. fib, went into A. fib with RVR on 1/22, where he was started on Cardizem drip. Cardiology were consulted, where they adjusted his Cardizem dosing and metoprolol dosing, and give him one dose of flecainide, which converted back to normal sinus rhythm, but unfortunately patient again into A. fib with RVR on 1/25, where he is back on Cardizem drip. - Patient continues to have gradual increase in his oxygen demand, chest x-ray was obtained, which did show bibasilar infiltrate, for which he was started on IV vancomycin and Zosyn for HCAP / aspiration PNA. - She has been seen by oncology on 1/25, and plan of care and goals were discussed, and family decided to convert to palliative/comfort care. Subjective / 24 H Interval events No  significant events overnight, no fever, no cough, no chest pain, no shortness of breath.  Assessment/Plan: Active Problems:   Diabetes mellitus without complication   DLBCL (diffuse large B cell lymphoma)   CKD (chronic kidney disease), stage III   Mucositis due to antineoplastic therapy   Anemia in neoplastic disease   Protein-calorie malnutrition, severe   Fall at home   Weakness generalized   Right hemiparesis   PAF (paroxysmal atrial fibrillation)   Oropharyngeal dysphagia   Feeding difficulties   Aspiration into airway   Stroke   weakness - Neurology consult appreciated. Symptoms felt more likely secondary to generalized weakness , cachectic neuropathy ,in the absence of significant finding on the imaging, as no metastatic disease or acute CVA was on  imaging, patient has progressive weakness, so paraneoplastic syndrome is being considered, myasthenia  gravis workup still pending, discussed with neurology, patient's family, started patient on IVIG empirically 1/23. - MRI brain is negative, -  MRI C spine showing Multilevel cervical spondylosis, worst at C5-6 where there is mild spinal stenosis, discussed with neurosurgery on call Dr. Ronnald Ramp,  these findings wouldn't cause the patient symptoms. - Discussed with oncology on call, CNS involvement is unlikely with B-cell lymphoma, no evidence of metastatic is on imaging, requested  oncology consult. - Carotid Doppler no significant stenosis - 2-D echo showing EF of 55% with grade 1 diastolic dysfunction. - At this point family choose additive/comfort care, no aggressive measures, IVIG has been stopped.  A. fib with RVR - Patient on metoprolol and Cardizem as an outpatient, on and off Cardizem drip during this hospital stay. - Cardiology consult appreciated, given flecainide 1 dose on 1/22 which converted back to normal sinus rhythm. - Not and the coagulation candidate given his lymphoma, age, severe deconditioning, and chemotherapy, and history of thrombocytopenia. - All meds has been stopped given his comfort care, off medication, off telemetry.  HCAP/ aspiration pneumonia - Chest x-ray showing bibasilar densities, more pronounced in the right. - started on IV vancomycin and Zosyn 1/25. - Currently off antibiotics  Dysphagia - SLP evaluation appreciated, patient failed another in MBS evaluation . - Gastroenterology input appreciated, dysphagia felt not a primary esophageal process, and most likely secondary to generalized weakness and deconditioning. - Started on tube feeds via NG tube, patient pulled his NG tube 1/25, patient is comfort care.  Right shoulder and hip pain - Secondary to fall. No fractures on XR  Leukocytosis - Most likely related to steroids, around baseline, x-ray showing no opacity or infiltrate, urinalysis is  Negative 1/18   DLBCL (diffuse large B cell lymphoma)  - Following with Dr. Jana Hakim as outpatient and on chemotherapy as an outpatient, chemotherapy currently on hold given patient poor functional  status. - Closure consult appreciated   Diabetes mellitus type 2  No further monitoring  CKD (chronic kidney disease), stage III  Renal function stable.   Mucositis due to antineoplastic therapy  - Treated with Diflucan. - Continue with nystatin.  Anemia in neoplastic disease    Protein-calorie malnutrition, severe    HTN   Diet: Diet NPO time specified Except for: Ice Chips Fluids: none DVT Prophylaxis: None  Code Status: DNR , comfort care, palliative care appreciated, awaiting [facility] nursing home evaluation. Family Communication: Son at bedside. adisposition plan: Be evaluated by [facility] hospice home   [facility] Neurology  Cardiology Gastroenterology Palliative care consult   Procedures:  None    Antibiotics  Anti-infectives    Start     Dose/Rate Route Frequency Ordered Stop   05/10/14 0000  vancomycin (VANCOCIN) IVPB 750 mg/150 ml premix  Status:  Discontinued     750 mg150 mL/hr over 60 Minutes Intravenous Every 12 hours 05/09/14 0942 05/09/14 1701   05/09/14 0945  piperacillin-tazobactam (ZOSYN) IVPB 3.375 g  Status:  Discontinued     3.375 g12.5 mL/hr over 240 Minutes Intravenous Every 8 hours 05/09/14 0939 05/09/14 1701   05/09/14 0945  vancomycin (VANCOCIN) 1,250 mg in sodium chloride 0.9 % 250 mL IVPB     1,250 mg166.7 mL/hr over 90 Minutes Intravenous  Once 05/09/14 0942 05/09/14 1403   05/02/14 1830  fluconazole (DIFLUCAN) tablet 100 mg  Status:  Discontinued     100 mg Oral Daily 05/02/14 1821  05/07/14 0701       Studies  Dg Chest Port 1 View  05/09/2014   CLINICAL DATA:  Hypoxia. History of lymphoma and obstructive sleep apnea.  EXAM: PORTABLE CHEST - 1 VIEW  COMPARISON:  05/08/2014  FINDINGS: Port-A-Cath tip near the superior cavoatrial junction. Nasogastric tube extends into the abdomen. Increased hazy densities in the right lower lung. Persistent densities at the left lung base. Upper lungs are clear without edema. Heart size is  normal.  IMPRESSION: Bibasilar densities with increased disease on the right side. Findings could represent a combination of airspace disease and pleural effusions.   Electronically Signed   By: Markus Daft M.D.   On: 05/09/2014 08:26    Objective  Filed Vitals:   05/09/14 1119 05/09/14 1210 05/09/14 2004 05/10/14 0400  BP:  117/86 159/96 155/96  Pulse: 107 80 104 160  Temp:  98.7 F (37.1 C) 98.4 F (36.9 C) 98.7 F (37.1 C)  TempSrc:  Oral Oral Oral  Resp:  20 20 20   Height:      Weight:    69.7 kg (153 lb 10.6 oz)  SpO2:  96% 100% 96%   Exam:  General:  NAD, awake   HEENT: no scleral icterus  Cardiovascular: RRR without MRG  Respiratory: CTA biL, no wheezing  Abdomen: soft, non tender  MSK/Extremities: no clubbing or cyanosis   Skin: no rashes  Neuro: Generalized weakness, weakness in proximal muscles in lower extremity remains the same.  Data Reviewed: Basic Metabolic Panel:  Recent Labs Lab 05/04/14 0615 05/05/14 0530 05/07/14 0306 05/08/14 0500 05/08/14 0730  NA 135 135 136 132*  --   K 3.9 4.2 3.7 3.3*  --   CL 95* 96 103 95*  --   CO2 31 31 29  32  --   GLUCOSE 152* 141* 139* 149*  --   BUN 15 20 21 12   --   CREATININE 0.69 0.90 0.67 0.53  --   CALCIUM 9.0 9.0 8.4 7.8*  --   PHOS  --   --   --   --  2.9   Liver Function Tests: No results for input(s): AST, ALT, ALKPHOS, BILITOT, PROT, ALBUMIN in the last 168 hours. CBC:  Recent Labs Lab 05/04/14 0615 05/05/14 0530 05/07/14 0306 05/08/14 0500 05/09/14 0820  WBC 16.8* 16.0* 17.2* 12.5* 14.2*  HGB 12.3* 12.1* 10.1* 9.4* 10.0*  HCT 36.4* 36.8* 31.0* 29.0* 29.8*  MCV 82.2 85.0 86.6 82.9 83.0  PLT 313 280 221 175 182   BNP (last 3 results)  Recent Labs  01/05/14 2142  PROBNP 343.7   CBG:  Recent Labs Lab 05/09/14 0341 05/09/14 0830 05/09/14 1605 05/09/14 2240 05/10/14 0410  GLUCAP 170* 179* 138* 139* 113*   Scheduled Meds: . antiseptic oral rinse  7 mL Mouth Rinse q12n4p  .  chlorhexidine  15 mL Mouth Rinse BID  . docusate sodium  100 mg Oral BID  . Influenza vac split quadrivalent PF  0.5 mL Intramuscular Tomorrow-1000  . miconazole  1 application Topical BID  . nystatin  5 mL Oral QID  . ondansetron  8 mg Oral Q12H  . polyethylene glycol  17 g Per Tube Daily  . sodium chloride  3 mL Intravenous Q12H   Continuous Infusions:   Time spent: 20 minutes  Phillips Climes , MD [facility] Pager [phone] If 7 PM - 7 AM, please contact night-coverage at [email or web address], password [facility] 05/10/2014, 12:20 PM  LOS: 8 days

## 2014-05-10 NOTE — Progress Notes (Signed)
Pt d/ced to Hospice with [facility] transporting Pt. Pt transported via stretcher with oxygen at 3L. Family at bedside and going to follow Pt. Family had Pt's belongings.

## 2014-05-10 NOTE — Progress Notes (Signed)
CSW (Clinical Education officer, museum) prepared pt [employer] for [employer] and placed with shadow chart. CSW arranged non-emergent ambulance transport. Pt family, pt nurse, and facility informed. CSW signing off.  [place], [place]

## 2014-05-10 NOTE — Clinical Social Work Psychosocial (Signed)
     Clinical Social Work Department BRIEF PSYCHOSOCIAL ASSESSMENT 05/10/2014  Patient:  Mcauliffe,Finnean     Account Number:  [id number]     Admit date:  05/02/2014  Clinical Social Worker:  Adair Laundry  Date/Time:  05/10/2014 11:24 AM  Referred by:  Physician  Date Referred:  05/10/2014 Referred for  SNF Placement   Other Referral:   Interview type:  Family Other interview type:    PSYCHOSOCIAL DATA Living Status:  WIFE Admitted from facility:   Level of care:   Primary support name:  Keian Odriscoll [phone] Primary support relationship to patient:  CHILD, ADULT Degree of support available:   Pt has strong family support    CURRENT CONCERNS Current Concerns  Post-Acute Placement   Other Concerns:    SOCIAL WORK ASSESSMENT / PLAN CSW notified that pt family has decided on comfort care and are interested in residential hospice placement. CSW spoke with pt wife and two sons. They asked to speak with CSW aware from bedside. CSW introduced self and explained CSW role. Pt family confirmed that they would like pt to dc to [facility]. Pt wife concerned about placement outside of [place] area because she is not familiar with [place] or other areas. CSW notified that if needed CSW will make alternate referrals but CSW will attempt to get placement at family first choice facility. Pt family very thankful for CSW information. CSW explained referral process and what would happen after referral is made. Family very understanding. They asked that CSW and facility contact pt son [name] who is pt HCPOA.   Assessment/plan status:  Psychosocial Support/Ongoing Assessment of Needs Other assessment/ plan:   Information/referral to community resources:   Residential Hospice List    PATIENTS/FAMILYS RESPONSE TO PLAN OF CARE: Pt family quiet with limited interaction during assessment. Pt son [name] visibly upset.      [place], [place]

## 2014-05-10 NOTE — Discharge Summary (Signed)
Erik Ramirez, 81 y.o., DOB 17-Feb-1926, MRN [id number]. Admission date: 05/02/2014 Discharge Date 05/10/2014 Primary MD Pcp Not In System Admitting Physician Phillips Climes, MD  Admission Diagnosis  Stroke [I63.9]  Discharge Diagnosis   Active Problems:   Diabetes mellitus without complication   DLBCL (diffuse large B cell lymphoma)   CKD (chronic kidney disease), stage III   Mucositis due to antineoplastic therapy   Anemia in neoplastic disease   Protein-calorie malnutrition, severe   Fall at home   Weakness generalized   Right hemiparesis   PAF (paroxysmal atrial fibrillation)   Oropharyngeal dysphagia   Feeding difficulties   Aspiration into airway    Past Medical History  Diagnosis Date  . Hypertension   . Hyperlipemia   . Anxiety   . Lymphadenopathy, abdominal   . Urethral stricture   . Anemia   . BPPV (benign paroxysmal positional vertigo)   . Lymphoma   . Diabetes mellitus without complication   . Obstructive sleep apnea     uses cpap at home  . Sleep apnea     Past Surgical History  Procedure Laterality Date  . Back surgery    . Prostate surgery    . Tonsillectomy       Hospital Course See H&P, Labs, Consult and Test reports for all details in brief, patient was admitted for **  Active Problems:   Diabetes mellitus without complication   DLBCL (diffuse large B cell lymphoma)   CKD (chronic kidney disease), stage III   Mucositis due to antineoplastic therapy   Anemia in neoplastic disease   Protein-calorie malnutrition, severe   Fall at home   Weakness generalized   Right hemiparesis   PAF (paroxysmal atrial fibrillation)   Oropharyngeal dysphagia   Feeding difficulties   Aspiration into airway  HPI: 81 year old male with history of diffuse large B-cell lymphoma on chemotherapy, diabetes mellitus, paroxysmal A. fib, hypertension, obstructive sleep apnea was recently hospitalized for neutropenic fever and anemia, elevated troponin due to demand  ischemia, and was discharged home with home health. Patient reports that he has been feeling weak since his discharge and not having enough strength in his legs. He fell in his bathroom about 10 days back and says that his legs gave away. He thinks he may have hit his right scapula, hip and right knee and leg. Since then he has been progressively feeling weak and having pain in his right shoulder, hips and the right leg. For past 2-3 days he reports that his right leg feels like "lead ". He denies any syncope, headache or dizziness. He denies any tingling or numbness of his extremities. Denies any bowel or urinary symptoms. Denies any blurred vision. He reports poor appetite and difficulty swallowing due to oral thrush. Patient denies fever, chills, nausea , vomiting, chest pain, palpitations, SOB, abdominal pain, bowel or urinary symptoms. As reported that his right hand has been week for past 1 week. Pt had presented for outpatient swallow eval today and was referred to ED given complains of right-sided weakness.  Hospital course  Patient admitted for further workup, MRI brain, MRI cervical spine, did not show any acute findings, no evidence of acute CVA, or metastatic disease, patient's symptoms most likely to generalized deconditioning, patient did fail swallow evaluation initially, but on repeat evaluation he was advanced to dysphagia 2 diet, unfortunately to be downgraded again to nothing by mouth after he failed another modified barium swallow on 1/22, NG tube was inserted, and started on tube feeds 1/23,  patient continues to have weakness, and deconditioning, paraneoplastic syndrome was being entertained, so patient was started on IVIG trial, pending Myasthenia gravis workup.  - Patient has known history of A. fib, went into A. fib with RVR on 1/22, where he was started on Cardizem drip. Cardiology were consulted, where they adjusted his Cardizem dosing and metoprolol dosing, and give him one dose  of flecainide, which converted back to normal sinus rhythm, but unfortunately patient again into A. fib with RVR on 1/25, where he is back on Cardizem drip. - Patient continues to have gradual increase in his oxygen demand, chest x-ray was obtained, which did show bibasilar infiltrate, for which he was started on IV vancomycin and Zosyn for HCAP / aspiration PNA. - She has been seen by oncology on 1/25, and plan of care and goals were discussed, and family decided to convert to palliative/comfort care.  weakness - Neurology consult appreciated. Symptoms felt more likely secondary to generalized weakness , cachectic neuropathy ,in the absence of significant finding on the imaging, as no metastatic disease or acute CVA was on imaging, patient has progressive weakness, so paraneoplastic syndrome is being considered, myasthenia gravis workup still pending, discussed with neurology, patient's family, started patient on IVIG empirically 1/23. - MRI brain is negative, - MRI C spine showing Multilevel cervical spondylosis, worst at C5-6 where there is mild spinal stenosis, discussed with neurosurgery on call Dr. Ronnald Ramp, these findings wouldn't cause the patient symptoms. - Discussed with oncology on call, CNS involvement is unlikely with B-cell lymphoma, no evidence of metastatic is on imaging, requested oncology consult. - Carotid Doppler no significant stenosis - 2-D echo showing EF of 55% with grade 1 diastolic dysfunction. - At this point family choose additive/comfort care, no aggressive measures, IVIG has been stopped.  A. fib with RVR - Patient on metoprolol and Cardizem as an outpatient, on and off Cardizem drip during this hospital stay. - Cardiology consult appreciated, given flecainide 1 dose on 1/22 which converted back to normal sinus rhythm. - Not and the coagulation candidate given his lymphoma, age, severe deconditioning, and chemotherapy, and history of thrombocytopenia. - All meds has  been stopped given his comfort care, off medication, off telemetry.  HCAP/ aspiration pneumonia - Chest x-ray showing bibasilar densities, more pronounced in the right. - started on IV vancomycin and Zosyn 1/25. - Currently off antibiotics  Dysphagia - SLP evaluation appreciated, patient failed another in MBS evaluation . - Gastroenterology input appreciated, dysphagia felt not a primary esophageal process, and most likely secondary to generalized weakness and deconditioning. - Started on tube feeds via NG tube, patient pulled his NG tube 1/25, patient is comfort care.  Right shoulder and hip pain - Secondary to fall. No fractures on XR  Leukocytosis - Most likely related to steroids, around baseline, x-ray showing no opacity or infiltrate, urinalysis is Negative 1/18   DLBCL (diffuse large B cell lymphoma)  - Following with Dr. Jana Hakim as outpatient and on chemotherapy as an outpatient, chemotherapy currently on hold given patient poor functional status. - Closure consult appreciated   Diabetes mellitus type 2  No further monitoring  CKD (chronic kidney disease), stage III  Renal function stable.   Mucositis due to antineoplastic therapy  - Treated with Diflucan. - Continue with nystatin.  Anemia in neoplastic disease    Protein-calorie malnutrition, severe    HTN     Code Status: DNR , comfort care,  Family Communication: wife at bedside.   [facility]  Cardiology  Gastroenterology Palliative care consult   Procedures:  None   Significant Tests:  See full reports for all details    Dg Shoulder Right  05/02/2014   CLINICAL DATA:  Pain all over  EXAM: RIGHT SHOULDER - 2+ VIEW  COMPARISON:  None.  FINDINGS: There is no fracture or dislocation. There are mild degenerative changes of the acromioclavicular joint.  IMPRESSION: No acute osseous injury of the right shoulder.   Electronically Signed   By: Kathreen Devoid   On: 05/02/2014 20:18   Dg  Tibia/fibula Left  04/18/2014   CLINICAL DATA:  Left leg pain and weakness follow multiple falls at home recently.  EXAM: LEFT TIBIA AND FIBULA - 2 VIEW  COMPARISON:  None.  FINDINGS: Anterior tibial tubercle spur. Mild patellar spur formation. No fracture or dislocation. Medium and lateral meniscus calcification.  IMPRESSION: 1. No fracture or dislocation. 2. Chondrocalcinosis and mild degenerative changes.   Electronically Signed   By: Enrique Sack M.D.   On: 04/18/2014 15:16   Ct Head Wo Contrast  05/05/2014   CLINICAL DATA:  Dysarthria, worsening RIGHT-sided weakness, RIGHT arm pain. Hypertension.  EXAM: CT HEAD WITHOUT CONTRAST  TECHNIQUE: Contiguous axial images were obtained from the base of the skull through the vertex without intravenous contrast.  COMPARISON:  MRI of the head May 02, 2014  FINDINGS: The ventricles and sulci are normal for age. No intraparenchymal hemorrhage, mass effect nor midline shift. Patchy supratentorial white matter hypodensities are within normal range for patient's age and though non-specific suggest sequelae of chronic small vessel ischemic disease. No acute large vascular territory infarcts.  No abnormal extra-axial fluid collections. Basal cisterns are patent. Moderate calcific atherosclerosis of the carotid siphons.  No skull fracture. The included ocular globes and orbital contents are non-suspicious. The mastoid aircells and included paranasal sinuses are well-aerated.  IMPRESSION: No acute intracranial process.  Involutional changes. Similar mild to moderate white matter changes can be seen with chronic small vessel ischemic disease.   Electronically Signed   By: [name]   On: 05/05/2014 00:41   Ct Head Wo Contrast  05/02/2014   CLINICAL DATA:  Right upper extremity weakness. Recent diagnosis of lymphoma.  EXAM: CT HEAD WITHOUT CONTRAST  TECHNIQUE: Contiguous axial images were obtained from the base of the skull through the vertex without intravenous  contrast.  COMPARISON:  CT scan dated 06/29/2007  FINDINGS: No mass lesion. No midline shift. No acute hemorrhage or hematoma. No extra-axial fluid collections. No evidence of acute infarction. There is a small old white matter infarct high in the right frontal lobe. There is diffuse slight cerebral cortical atrophy, stable. No ventricular dilatation.  Osseous structures are normal.  IMPRESSION: No acute abnormality. 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   Electronically Signed   By: Rozetta Nunnery M.D.   On: 05/02/2014 14:08   Mr Jodene Nam Head Wo Contrast  05/02/2014   CLINICAL DATA:  Leg weakness. Difficulty walking. History of lymphoma and diabetes and atrial fibrillation.  EXAM: MRI HEAD WITHOUT AND WITH CONTRAST  MRA HEAD WITHOUT CONTRAST  TECHNIQUE: Multiplanar, multiecho pulse sequences of the brain and surrounding structures were obtained without and with intravenous contrast. Angiographic images of the head were obtained using MRA technique without contrast.  CONTRAST:  37mL MULTIHANCE GADOBENATE DIMEGLUMINE 529 MG/ML IV SOLN  COMPARISON:  CT head 05/02/2014  FINDINGS: MRI HEAD FINDINGS  Negative for acute infarct.  Moderate atrophy. Mild chronic microvascular ischemic changes in the  cerebral white matter. Basal ganglia and brainstem and cerebellum normal.  Negative for mass or edema.  Normal enhancement following contrast administration. No enhancing mass lesion identified.  MRA HEAD FINDINGS  Both vertebral arteries are patent to the basilar without significant stenosis. Left vertebral artery dominant. PICA, superior cerebellar, posterior cerebral artery is patent bilaterally without stenosis. Basilar widely patent.  Internal carotid artery widely patent bilaterally. Anterior and middle cerebral arteries widely patent without stenosis  Negative for cerebral aneurysm.  IMPRESSION: Atrophy and mild chronic microvascular ischemia.  No acute infarct  Negative MRA head.   Electronically Signed    By: Franchot Gallo M.D.   On: 05/02/2014 20:14   Mr Jeri Cos ZO Contrast  05/02/2014   CLINICAL DATA:  Leg weakness. Difficulty walking. History of lymphoma and diabetes and atrial fibrillation.  EXAM: MRI HEAD WITHOUT AND WITH CONTRAST  MRA HEAD WITHOUT CONTRAST  TECHNIQUE: Multiplanar, multiecho pulse sequences of the brain and surrounding structures were obtained without and with intravenous contrast. Angiographic images of the head were obtained using MRA technique without contrast.  CONTRAST:  64mL MULTIHANCE GADOBENATE DIMEGLUMINE 529 MG/ML IV SOLN  COMPARISON:  CT head 05/02/2014  FINDINGS: MRI HEAD FINDINGS  Negative for acute infarct.  Moderate atrophy. Mild chronic microvascular ischemic changes in the cerebral white matter. Basal ganglia and brainstem and cerebellum normal.  Negative for mass or edema.  Normal enhancement following contrast administration. No enhancing mass lesion identified.  MRA HEAD FINDINGS  Both vertebral arteries are patent to the basilar without significant stenosis. Left vertebral artery dominant. PICA, superior cerebellar, posterior cerebral artery is patent bilaterally without stenosis. Basilar widely patent.  Internal carotid artery widely patent bilaterally. Anterior and middle cerebral arteries widely patent without stenosis  Negative for cerebral aneurysm.  IMPRESSION: Atrophy and mild chronic microvascular ischemia.  No acute infarct  Negative MRA head.   Electronically Signed   By: Franchot Gallo M.D.   On: 05/02/2014 20:14   Mr Cervical Spine W Wo Contrast  05/04/2014   CLINICAL DATA:  Right hemiparesis. Diffuse large B-cell lymphoma, beginning chemotherapy 12/2013.  EXAM: MRI CERVICAL SPINE WITHOUT AND WITH CONTRAST  TECHNIQUE: Multiplanar and multiecho pulse sequences of the cervical spine, to include the craniocervical junction and cervicothoracic junction, were obtained according to standard protocol without and with intravenous contrast.  CONTRAST:  43mL  MULTIHANCE GADOBENATE DIMEGLUMINE 529 MG/ML IV SOLN  COMPARISON:  PET-CT 03/09/2014.  Cervical spine CT 06/29/2007.  FINDINGS: Images are mildly to moderately degraded by motion artifact.  Vertebral alignment is unchanged from prior cervical spine CT, with straightening of the normal cervical lordosis, trace anterolisthesis of C4 on C5, and trace retrolisthesis of C5 on C6. Severe disc space narrowing is again seen at C5-6. Vertebral bone marrow signal is within normal limits aside from mild degenerative marrow changes in the mid to lower cervical spine. No enhancing lesions are identified. Craniocervical junction is unremarkable. Cervical spinal cord is normal in caliber without definite signal abnormality identified.  C2-3:  Small central disc protrusion without stenosis.  C3-4:  Negative.  C4-5:  Negative.  C5-6: Listhesis and endplate osteophytosis result in mild spinal stenosis and mild bilateral neural foraminal stenosis.  C6-7: Minimal disc bulging and uncovertebral spurring without significant stenosis.  C7-T1: Right greater than left facet arthrosis results in minimal right neural foraminal narrowing. No spinal stenosis.  IMPRESSION: Multilevel cervical spondylosis, worst at C5-6 where there is mild spinal stenosis.   Electronically Signed   By: Logan Bores  On: 05/04/2014 14:00   Dg Op Swallowing Func-medicare/speech Path  05/02/2014   Procedures by Earley Brooke, CCC-SLP at 05/02/2014 1:24 PM    Author: Earley Brooke, CCC-SLP Service: (none) Author Type: Speech  and Language Pathologist   Filed: 05/02/2014 1:25 PM Note Time: 05/02/2014 1:24 PM Status: Signed   Editor: Earle Gell McCoy, CCC-SLP (Speech and Language Pathologist)      Procedures   1. SLP MODIFIED BARIUM SWALLOW [[id number] (Custom)]       Expand All Collapse All    Objective Swallowing Evaluation: Modified Barium Swallowing Study  Patient Details  Name: Belton Peplinski MRN: [id number] Date of Birth: 12-13-1925  Today's Date:  05/02/2014 Time: SLP Start Time (ACUTE ONLY): 1135-SLP Stop Time (ACUTE ONLY): 1240 SLP Time Calculation (min) (ACUTE ONLY): 65 min  Past Medical History:  Past Medical History  Diagnosis Date  . Hypertension   . Hyperlipemia   . Anxiety   . Lymphadenopathy, abdominal   . Urethral stricture   . Anemia   . BPPV (benign paroxysmal positional vertigo)   . Lymphoma   . Diabetes mellitus without complication   . Obstructive sleep apnea     uses cpap at home  . Sleep apnea    Past Surgical History:  Past Surgical History  Procedure Laterality Date  . Back surgery    . Prostate surgery    . Tonsillectomy     HPI:  HPI: 81 year old male with h/o HTN, anxiety, lymphoma, sleep apnea, DM,  anemia seen for OP MBS due to c/o dysphagia x 1 week. Patient and son  providing history which includes also within the week, new onset right arm  weakness and numbness, right leg numbness, significant generalized  weakness, severe vocal quality changes, falls. SLP confirms severe  dysphonia (almost aphonic) as well as right sided facial droop.   No Data Recorded  Assessment / Plan / Recommendation CHL IP CLINICAL IMPRESSIONS 05/02/2014  Dysphagia Diagnosis Moderate pharyngeal phase dysphagia;Severe pharyngeal  phase dysphagia  Clinical impression Patient presents with a moderate-severe primarily  pharyngeal dysphagia characterized by base of tongue, laryngeal, and  pharyngeal weakness resulting in decreased pharyngeal clearance of bolus  and decreased airway protection. SEvere pharyngeal residuals remained post  swallow which patient senses, responding with multiple swallows which are  moderately effective to clear. Penetrates noted with nectar thick liquids  clear with either multiple swallows or clinician cueing for throat clear.  Various head postures assessed (chin tuck, right head turn) and  unsuccessful to reduce pharyngeal residuals or facilitate improved airway   protection. Overall, risk of aspiration high at this time given severity  of deficits, reducing to moderate with strict use of aspiration  precautions which were reviewed in full with patient and son. Given  severity of neuro symptoms suggestive of CVA, recommended rapid MD f/u.  Attempted to contact referring physician, Dr. Jana Hakim, via phone without  response. Recommended patient go to ED given stroke symptoms. Patient and  family in agreement.       CHL IP TREATMENT RECOMMENDATION 05/02/2014  Treatment Plan Recommendations Defer treatment plan to SLP at (Comment)     CHL IP DIET RECOMMENDATION 05/02/2014  Diet Recommendations Dysphagia 2 (Fine chop);Nectar-thick liquid  Liquid Administration via Cup;No straw  Medication Administration Crushed with puree  Compensations Slow rate;Small sips/bites;Multiple dry swallows after each  bite/sip;Clear throat after each swallow  Postural Changes and/or Swallow Maneuvers Seated upright 90 degrees     CHL IP OTHER RECOMMENDATIONS 05/02/2014  Recommended Consults (None)  Oral Care Recommendations Oral care BID  Other Recommendations Prohibited food (jello, ice cream, thin soups)     CHL IP FOLLOW UP RECOMMENDATIONS 05/02/2014  Follow up Recommendations Home health SLP     No flowsheet data found.       SLP Swallow Goals No flowsheet data found.  No flowsheet data found.    CHL IP REASON FOR REFERRAL 05/02/2014  Reason for Referral Objectively evaluate swallowing function     CHL IP ORAL PHASE 05/02/2014  Lips (None)  Tongue (None)  Mucous membranes (None)  Nutritional status (None)  Other (None)  Oxygen therapy (None)  Oral Phase WFL  Oral - Pudding Teaspoon (None)  Oral - Pudding Cup (None)  Oral - Honey Teaspoon (None)  Oral - Honey Cup (None)  Oral - Honey Syringe (None)  Oral - Nectar Teaspoon (None)  Oral - Nectar Cup (None)  Oral - Nectar Straw (None)  Oral - Nectar Syringe (None)  Oral -  Ice Chips (None)  Oral - Thin Teaspoon (None)  Oral - Thin Cup (None)  Oral - Thin Straw (None)  Oral - Thin Syringe (None)  Oral - Puree (None)  Oral - Mechanical Soft (None)  Oral - Regular (None)  Oral - Multi-consistency (None)  Oral - Pill (None)  Oral Phase - Comment (None)      CHL IP PHARYNGEAL PHASE 05/02/2014  Pharyngeal Phase Impaired  Pharyngeal - Pudding Teaspoon (None)  Penetration/Aspiration details (pudding teaspoon) (None)  Pharyngeal - Pudding Cup (None)  Penetration/Aspiration details (pudding cup) (None)  Pharyngeal - Honey Teaspoon (None)  Penetration/Aspiration details (honey teaspoon) (None)  Pharyngeal - Honey Cup (None)  Penetration/Aspiration details (honey cup) (None)  Pharyngeal - Honey Syringe (None)  Penetration/Aspiration details (honey syringe) (None)  Pharyngeal - Nectar Teaspoon Delayed swallow initiation;Premature  spillage to pyriform sinuses;Reduced pharyngeal peristalsis;Reduced  epiglottic inversion;Reduced anterior laryngeal mobility;Reduced laryngeal  elevation;Reduced airway/laryngeal closure;Reduced tongue base  retraction;Penetration/Aspiration before swallow;Penetration/Aspiration  during swallow;Pharyngeal residue - valleculae;Pharyngeal residue -  pyriform sinuses;Lateral channel residue;Compensatory strategies attempted  (Comment)  Penetration/Aspiration details (nectar teaspoon) Material enters airway,  CONTACTS cords and not ejected out  Pharyngeal - Nectar Cup Delayed swallow initiation;Premature spillage to  pyriform sinuses;Reduced pharyngeal peristalsis;Reduced epiglottic  inversion;Reduced anterior laryngeal mobility;Reduced laryngeal  elevation;Reduced airway/laryngeal closure;Reduced tongue base  retraction;Penetration/Aspiration before swallow;Penetration/Aspiration  during swallow;Pharyngeal residue - valleculae;Pharyngeal residue -  pyriform sinuses;Lateral channel residue;Compensatory strategies attempted   (Comment)  Penetration/Aspiration details (nectar cup) Material enters airway,  CONTACTS cords and not ejected out  Pharyngeal - Nectar Straw (None)  Penetration/Aspiration details (nectar straw) (None)  Pharyngeal - Nectar Syringe (None)  Penetration/Aspiration details (nectar syringe) (None)  Pharyngeal - Ice Chips (None)  Penetration/Aspiration details (ice chips) (None)  Pharyngeal - Thin Teaspoon (None)  Penetration/Aspiration details (thin teaspoon) (None)  Pharyngeal - Thin Cup Delayed swallow initiation;Premature spillage to  pyriform sinuses;Reduced pharyngeal peristalsis;Reduced epiglottic  inversion;Reduced anterior laryngeal mobility;Reduced laryngeal  elevation;Reduced airway/laryngeal closure;Reduced tongue base  retraction;Penetration/Aspiration before swallow;Penetration/Aspiration  during swallow;Pharyngeal residue - valleculae;Pharyngeal residue -  pyriform sinuses;Lateral channel residue;Compensatory strategies attempted  (Comment);Moderate aspiration  Penetration/Aspiration details (thin cup) Material enters airway, passes  BELOW cords and not ejected out despite cough attempt by patient  Pharyngeal - Thin Straw (None)  Penetration/Aspiration details (thin straw) (None)  Pharyngeal - Thin Syringe (None)  Penetration/Aspiration details (thin syringe') (None)  Pharyngeal - Puree Delayed swallow initiation;Premature spillage to  pyriform sinuses;Reduced pharyngeal peristalsis;Reduced epiglottic  inversion;Reduced anterior laryngeal mobility;Reduced laryngeal  elevation;Reduced  airway/laryngeal closure;Reduced tongue base  retraction;Pharyngeal residue - valleculae;Pharyngeal residue - pyriform  sinuses;Lateral channel residue;Compensatory strategies attempted  (Comment)  Penetration/Aspiration details (puree) Material does not enter airway  Pharyngeal - Mechanical Soft Delayed swallow initiation;Premature  spillage to pyriform sinuses;Reduced pharyngeal  peristalsis;Reduced  epiglottic inversion;Reduced anterior laryngeal mobility;Reduced laryngeal  elevation;Reduced airway/laryngeal closure;Reduced tongue base  retraction;Pharyngeal residue - valleculae;Pharyngeal residue - pyriform  sinuses;Lateral channel residue;Compensatory strategies attempted  (Comment)  Penetration/Aspiration details (mechanical soft) Material does not enter  airway  Pharyngeal - Regular (None)  Penetration/Aspiration details (regular) (None)  Pharyngeal - Multi-consistency (None)  Penetration/Aspiration details (multi-consistency) (None)  Pharyngeal - Pill (None)  Penetration/Aspiration details (pill) (None)  Pharyngeal Comment (None)     CHL IP CERVICAL ESOPHAGEAL PHASE 05/02/2014  Cervical Esophageal Phase Impaired  Pudding Teaspoon (None)  Pudding Cup (None)  Honey Teaspoon (None)  Honey Cup (None)  Honey Syringe (None)  Nectar Teaspoon (None)  Nectar Cup (None)  Nectar Straw (None)  Nectar Syringe (None)  Thin Teaspoon (None)  Thin Cup (None)  Thin Straw (None)  Thin Syringe (None)  Cervical Esophageal Comment decreased UES relaxation    CHL IP GO 05/02/2014  Functional Assessment Tool Used skilled clinical judgement  Functional Limitations Swallowing  Swallow Current Status (Z3086) CL  Swallow Goal Status (V7846) CL  Swallow Discharge Status (N6295) CL  Motor Speech Current Status (M8413) (None)  Motor Speech Goal Status (K4401) (None)  Motor Speech Goal Status (U2725) (None)  Spoken Language Comprehension Current Status (D6644) (None)  Spoken Language Comprehension Goal Status (I3474) (None)  Spoken Language Comprehension Discharge Status (Q5956) (None)  Spoken Language Expression Current Status (L8756) (None)  Spoken Language Expression Goal Status (E3329) (None)  Spoken Language Expression Discharge Status (J1884) (None)  Attention Current Status (Z6606) (None)  Attention Goal Status (T0160) (None)   Attention Discharge Status (F0932) (None)  Memory Current Status (T5573) (None)  Memory Goal Status (U2025) (None)  Memory Discharge Status (K2706) (None)  Voice Current Status (C3762) (None)  Voice Goal Status (G3151) (None)  Voice Discharge Status (V6160) (None)  Other Speech-Language Pathology Functional Limitation (V3710) (None)  Other Speech-Language Pathology Functional Limitation Goal Status (G2694)  (None)  Other Speech-Language Pathology Functional Limitation Discharge Status  [id number]) (None)   Gabriel Rainwater MA, CCC-SLP [phone]         McCoy Leah Meryl 05/02/2014, 1:24 PM            05/02/2014   CLINICAL DATA: swallowing difficulties   FLUOROSCOPY FOR SWALLOWING FUNCTION STUDY:  Fluoroscopy was provided for swallowing function study, which was  administered by a speech pathologist.  Final results and recommendations  from this study are contained within the speech pathology report.    Dg Chest Port 1 View  05/09/2014   CLINICAL DATA:  Hypoxia. History of lymphoma and obstructive sleep apnea.  EXAM: PORTABLE CHEST - 1 VIEW  COMPARISON:  05/08/2014  FINDINGS: Port-A-Cath tip near the superior cavoatrial junction. Nasogastric tube extends into the abdomen. Increased hazy densities in the right lower lung. Persistent densities at the left lung base. Upper lungs are clear without edema. Heart size is normal.  IMPRESSION: Bibasilar densities with increased disease on the right side. Findings could represent a combination of airspace disease and pleural effusions.   Electronically Signed   By: Markus Daft M.D.   On: 05/09/2014 08:26   Dg Chest Port 1 View  05/08/2014   CLINICAL DATA:  Hypoxia  EXAM: PORTABLE CHEST - 1 VIEW  COMPARISON:  05/04/2014  FINDINGS: Cardiac shadow is stable. The lungs are well aerated bilaterally. Bibasilar infiltrative changes seen slightly worse on the right than the left. A right-sided chest wall port is again noted.  IMPRESSION: Bibasilar infiltrates  new from the prior study.   Electronically Signed   By: Inez Catalina M.D.   On: 05/08/2014 08:09   Dg Chest Port 1 View  05/04/2014   CLINICAL DATA:  Aspiration.  Cough, shortness of Breath  EXAM: PORTABLE CHEST - 1 VIEW  COMPARISON:  04/12/2014  FINDINGS: Right Port-A-Cath is in place. The tip is in the SVC. Heart and mediastinal contours are within normal limits. No focal opacities or effusions. No acute bony abnormality.  IMPRESSION: No active disease.   Electronically Signed   By: Rolm Baptise M.D.   On: 05/04/2014 12:18   Dg Chest Port 1 View  04/12/2014   CLINICAL DATA:  Fever, sore throat.  Cough.  History of lymphoma.  EXAM: PORTABLE CHEST - 1 VIEW  COMPARISON:  PET-CT 03/09/2014.  Radiograph 01/17/2014  FINDINGS: Right chest port remains in place, tip in the mid SVC. Diminished left pleural effusion with minimal residual blunting of left costophrenic angle. No airspace consolidation to suggest pneumonia. The cardiomediastinal contours are unchanged. There is no pneumothorax.  IMPRESSION: Diminished left pleural effusion with minimal residual blunting of the costophrenic angle. No acute pulmonary process.   Electronically Signed   By: Jeb Levering M.D.   On: 04/12/2014 19:15   Dg Abd Portable 1v  05/06/2014   CLINICAL DATA:  Nasogastric tube placement  EXAM: PORTABLE ABDOMEN - 1 VIEW  COMPARISON:  None.  FINDINGS: Nasogastric tube is identified distal tip in the proximal stomach. The visualized bowel gas pattern is normal. There is minimal left pleural effusion.  IMPRESSION: Nasogastric tube noted with distal tip in the proximal stomach.   Electronically Signed   By: Abelardo Diesel M.D.   On: 05/06/2014 19:09   Dg Swallowing Func-speech Pathology  05/06/2014   Lorre Nick, CCC-SLP     05/06/2014 11:27 AM  Objective Swallowing Evaluation: Modified Barium Swallowing Study   Patient Details  Name: Zayvion Stailey MRN: [id number] Date of Birth: 1925/07/28  Today's Date: 05/06/2014 Time: SLP Start  Time (ACUTE ONLY): 1045-SLP Stop Time (ACUTE  ONLY): 1105 SLP Time Calculation (min) (ACUTE ONLY): 20 min  Past Medical History:  Past Medical History  Diagnosis Date  . Hypertension   . Hyperlipemia   . Anxiety   . Lymphadenopathy, abdominal   . Urethral stricture   . Anemia   . BPPV (benign paroxysmal positional vertigo)   . Lymphoma   . Diabetes mellitus without complication   . Obstructive sleep apnea     uses cpap at home  . Sleep apnea    Past Surgical History:  Past Surgical History  Procedure Laterality Date  . Back surgery    . Prostate surgery    . Tonsillectomy     HPI:  HPI: 81 year old male with h/o HTN, anxiety, lymphoma, sleep  apnea, DM, anemia seen for OP MBS due to c/o dysphagia x 1 week.  Patient and son providing history which includes also within the  week, new onset right arm weakness and numbness, right leg  numbness, significant generalized weakness, severe vocal quality  changes, falls. SLP confirms severe dysphonia (almost aphonic) as  well as right sided facial droop.   No Data Recorded  Assessment / Plan / Recommendation CHL IP CLINICAL IMPRESSIONS 05/06/2014  Dysphagia Diagnosis  Moderate pharyngeal phase dysphagia;Severe  pharyngeal phase dysphagia  Clinical impression Patient continues to present with a  moderate-severe primarily pharyngeal dysphagia characterized by  base of tongue, laryngeal, and pharyngeal weakness, as well as  reduced UES relaxation. This results in poor pharyngeal clearance  of all tested consistencies, as well as decreased airway  protection of thin liquids (via tsp) and nectar thick liquids  (via cup sip). No penetration was noted on puree consistency.  Significant post-swallow residual noted pharyngeally across  consistencies, which patient continues to sense, responding with  multiple dry swallows or bringing residual back to oral cavity to  be suctioned. Various head postures assessed and were again  unsuccessful to reduce pharyngeal residuals. Overall, risk of   aspiration continues to be high, given severity of deficits, and  risk of poor nutrition is also an issue, given the minimal amount  of material which successfully passes into the esophagus.   Recommend further esophageal work up to assess appropriateness  for management of UES issue.  Pt appears safe for small  individual boluses of nectar thick liquid via teaspoon to  mitigate disuse atrophy, however, primary nutrition, hydration,  and medication is recommended to be NPO at this time.      CHL IP TREATMENT RECOMMENDATION 05/06/2014  Treatment Plan Recommendations Therapy as outlined in treatment  plan below     CHL IP DIET RECOMMENDATION 05/06/2014  Diet Recommendations NPO  Liquid Administration via Spoon  Medication Administration Via alternative means  Compensations (None)  Postural Changes and/or Swallow Maneuvers (None)     CHL IP OTHER RECOMMENDATIONS 05/06/2014  Recommended Consults Consider GI evaluation;Consider ENT  evaluation  Oral Care Recommendations Oral care Q4 per protocol  Other Recommendations Have oral suction available     CHL IP FOLLOW UP RECOMMENDATIONS 05/06/2014  Follow up Recommendations Home health SLP;Outpatient SLP     CHL IP FREQUENCY AND DURATION 05/06/2014  Speech Therapy Frequency (ACUTE ONLY) min 2x/week  Treatment Duration 2 weeks     Pertinent Vitals/Pain None reported    SLP Swallow Goals No flowsheet data found.  No flowsheet data found.    CHL IP REASON FOR REFERRAL 05/06/2014  Reason for Referral Objectively evaluate swallowing function     CHL IP ORAL PHASE 05/06/2014  Lips (None)  Tongue (None)  Mucous membranes (None)  Nutritional status (None)  Other (None)  Oxygen therapy (None)  Oral Phase WFL  Oral - Pudding Teaspoon (None)  Oral - Pudding Cup (None)  Oral - Honey Teaspoon (None)  Oral - Honey Cup (None)  Oral - Honey Syringe (None)  Oral - Nectar Teaspoon (None)  Oral - Nectar Cup (None)  Oral - Nectar Straw (None)  Oral - Nectar Syringe (None)  Oral - Ice Chips (None)   Oral - Thin Teaspoon (None)  Oral - Thin Cup (None)  Oral - Thin Straw (None)  Oral - Thin Syringe (None)  Oral - Puree (None)  Oral - Mechanical Soft (None)  Oral - Regular (None)  Oral - Multi-consistency (None)  Oral - Pill (None)  Oral Phase - Comment (None)      CHL IP PHARYNGEAL PHASE 05/06/2014  Pharyngeal Phase Impaired  Pharyngeal - Pudding Teaspoon (None)  Penetration/Aspiration details (pudding teaspoon) (None)  Pharyngeal - Pudding Cup (None)  Penetration/Aspiration details (pudding cup) (None)  Pharyngeal - Honey Teaspoon (None)  Penetration/Aspiration details (honey teaspoon) (None)  Pharyngeal - Honey Cup (None)  Penetration/Aspiration details (honey cup) (None)  Pharyngeal - Honey Syringe (None)  Penetration/Aspiration details (honey syringe) (None)  Pharyngeal - Nectar Teaspoon Delayed swallow initiation;Reduced  pharyngeal peristalsis;Reduced epiglottic inversion;Reduced  anterior laryngeal mobility;Reduced laryngeal elevation;Reduced  airway/laryngeal closure;Reduced tongue base  retraction;Pharyngeal residue - valleculae;Pharyngeal residue -  pyriform sinuses;Lateral channel residue;Compensatory strategies  attempted (Comment);Premature spillage to valleculae  Penetration/Aspiration details (nectar teaspoon) (None)  Pharyngeal - Nectar Cup Delayed swallow initiation;Premature  spillage to pyriform sinuses;Reduced pharyngeal  peristalsis;Reduced epiglottic inversion;Reduced anterior  laryngeal mobility;Reduced laryngeal elevation;Reduced  airway/laryngeal closure;Reduced tongue base  retraction;Penetration/Aspiration during swallow;Pharyngeal  residue - valleculae;Pharyngeal residue - pyriform  sinuses;Lateral channel residue;Compensatory strategies attempted  (Comment);Premature spillage to valleculae  Penetration/Aspiration details (nectar cup) Material enters  airway, remains ABOVE vocal cords and not ejected out  Pharyngeal - Nectar Straw (None)  Penetration/Aspiration details (nectar straw)  (None)  Pharyngeal - Nectar Syringe (None)  Penetration/Aspiration details (nectar syringe) (None)  Pharyngeal - Ice Chips (None)  Penetration/Aspiration details (ice chips) (None)  Pharyngeal - Thin Teaspoon Delayed swallow initiation;Premature  spillage to valleculae;Premature spillage to pyriform  sinuses;Reduced pharyngeal peristalsis;Reduced epiglottic  inversion;Reduced tongue base retraction;Reduced anterior  laryngeal mobility;Penetration/Aspiration during swallow;Reduced  laryngeal elevation;Reduced airway/laryngeal closure;Trace  aspiration;Pharyngeal residue - valleculae;Pharyngeal residue -  pyriform sinuses;Pharyngeal residue - posterior pharnyx;Lateral  channel residue;Compensatory strategies attempted (Comment)  Penetration/Aspiration details (thin teaspoon) Material enters  airway, passes BELOW cords without attempt by patient to eject  out (silent aspiration)  Pharyngeal - Thin Cup NT  Penetration/Aspiration details (thin cup) (None)  Pharyngeal - Thin Straw (None)  Penetration/Aspiration details (thin straw) (None)  Pharyngeal - Thin Syringe (None)  Penetration/Aspiration details (thin syringe') (None)  Pharyngeal - Puree Delayed swallow initiation;Premature spillage  to pyriform sinuses;Reduced pharyngeal peristalsis;Reduced  epiglottic inversion;Reduced anterior laryngeal mobility;Reduced  laryngeal elevation;Reduced airway/laryngeal closure;Reduced  tongue base retraction;Pharyngeal residue - valleculae;Pharyngeal  residue - pyriform sinuses;Lateral channel residue;Compensatory  strategies attempted (Comment);Premature spillage to  valleculae;Pharyngeal residue - posterior pharnyx  Penetration/Aspiration details (puree) Material does not enter  airway  Pharyngeal - Mechanical Soft NT  Penetration/Aspiration details (mechanical soft) (None)  Pharyngeal - Regular (None)  Penetration/Aspiration details (regular) (None)  Pharyngeal - Multi-consistency (None)  Penetration/Aspiration details  (multi-consistency) (None)  Pharyngeal - Pill (None)  Penetration/Aspiration details (pill) (None)  Pharyngeal Comment (None)     CHL IP CERVICAL ESOPHAGEAL PHASE 05/06/2014  Cervical Esophageal Phase Impaired  Pudding Teaspoon (None)  Pudding Cup (None)  Honey Teaspoon (None)  Honey Cup (None)  Honey Syringe (None)  Nectar Teaspoon (None)  Nectar Cup (None)  Nectar Straw (None)  Nectar Syringe (None)  Thin Teaspoon (None)  Thin Cup (None)  Thin Straw (None)  Thin Syringe (None)  Cervical Esophageal Comment (No Data)    CHL IP GO 05/02/2014  Functional Assessment Tool Used skilled clinical judgement  Functional Limitations Swallowing  Swallow Current Status (G8185) CL  Swallow Goal Status (U3149) CL  Swallow Discharge Status (F0263) CL  Motor Speech Current Status (Z8588) (None)  Motor Speech Goal Status (F0277) (None)  Motor Speech Goal Status (A1287) (None)  Spoken Language Comprehension Current Status (O6767) (None)  Spoken Language Comprehension Goal Status (M0947) (None)  Spoken Language Comprehension Discharge Status (S9628) (None)  Spoken Language Expression Current Status (Z6629) (None)  Spoken Language Expression Goal Status (U7654) (None)  Spoken Language Expression Discharge Status (Y5035) (None)  Attention Current Status (W6568) (None)  Attention Goal Status (L2751) (None)  Attention Discharge Status (Z0017) (None)  Memory Current Status (C9449) (None)  Memory Goal Status (Q7591) (None)  Memory Discharge Status (M3846) (None)  Voice Current Status (  G9171) (None)  Voice Goal Status [phone]) (None)  Voice Discharge Status [phone]) (None)  Other Speech-Language Pathology Functional Limitation [phone])  (None)  Other Speech-Language Pathology Functional Limitation Goal Status  (N8295) (None)  Other Speech-Language Pathology Functional Limitation Discharge  Status [phone]) (None)          Celia B. Quentin Ore [facility], CCC-SLP [phone]  Shonna Chock 05/06/2014, 11:26 AM    Dg Hips Bilat With Pelvis  2v  05/02/2014   CLINICAL DATA:  Fall.  Pain.  EXAM: DG HIP W/ PELVIS 2V BILAT  COMPARISON:  None.  FINDINGS: No acute fracture. No dislocation. Mild osteopenia. Minimal degenerative change of the right hip joint.  IMPRESSION: No acute bony pathology.   Electronically Signed   By: Maryclare Bean M.D.   On: 05/02/2014 20:14   Dg Hip Unilat With Pelvis 2-3 Views Left  04/18/2014   CLINICAL DATA:  Several falls outcome recently. LEFT leg weakness. LEFT hip pain radiating down the LEFT leg. Initial encounter.  EXAM: RADIOLOGY EXAMINATION  COMPARISON:  None.  FINDINGS: AP view of the pelvis and AP and lateral views of the LEFT hip are submitted for interpretation. LEFT hip is located. Negative for fracture. Pelvic rings appear intact. Sacral arcades appear normal.  IMPRESSION: No acute osseous abnormality.   Electronically Signed   By: Dereck Ligas M.D.   On: 04/18/2014 15:23   Dg Femur Min 2 Views Left  04/18/2014   CLINICAL DATA:  Several falls at home recently.  LEFT leg weakness.  EXAM: RADIOLOGY EXAMINATION  COMPARISON:  None.  FINDINGS: Atherosclerosis is present. There is no fracture or acute osseous abnormality. AP and lateral views of the femur submitted for interpretation. The visualized portions of the knee demonstrate chondrocalcinosis. No aggressive osseous lesions.  IMPRESSION: No acute injury to the LEFT femur. Chondrocalcinosis of the knee, probably senile degenerative in this age group.   Electronically Signed   By: Dereck Ligas M.D.   On: 04/18/2014 15:14     Today   Subjective:   Erik Ramirez  No significant events overnight, no fever, no cough, no chest pain, no shortness of breath. Objective:   Blood pressure 140/86, pulse 96, temperature 98 F (36.7 C), temperature source Oral, resp. rate 20, height 5\' 9"  (1.753 m), weight 69.7 kg (153 lb 10.6 oz), SpO2 96 %.  Intake/Output Summary (Last 24 hours) at 05/10/14 1511 Last data filed at 05/10/14 0546  Gross per 24 hour  Intake       0 ml  Output    825 ml  Net   -825 ml    Exam   General: NAD, awake   HEENT: no scleral icterus  Cardiovascular: RRR without MRG  Respiratory: CTA biL, no wheezing  Abdomen: soft, non tender  MSK/Extremities: no clubbing or cyanosis   Skin: no rashes  Neuro: Generalized weakness, weakness in proximal muscles in lower extremity remains the same. Data Review     CBC w Diff:  Lab Results  Component Value Date   WBC 14.2* 05/09/2014   WBC 15.8* 04/25/2014   HGB 10.0* 05/09/2014   HGB 12.7* 04/25/2014   HCT 29.8* 05/09/2014   HCT 39.7 04/25/2014   PLT 182 05/09/2014   PLT 318 04/25/2014   LYMPHOPCT 10* 05/02/2014   LYMPHOPCT 11.9* 04/25/2014   MONOPCT 6 05/02/2014   MONOPCT 10.0 04/25/2014   EOSPCT 0 05/02/2014   EOSPCT 0.1 04/25/2014   BASOPCT 0 05/02/2014   BASOPCT 1.3 04/25/2014  CMP:  Lab Results  Component Value Date   NA 132* 05/08/2014   NA 133* 04/25/2014   K 3.3* 05/08/2014   K 3.7 04/25/2014   CL 95* 05/08/2014   CO2 32 05/08/2014   CO2 29 04/25/2014   BUN 12 05/08/2014   BUN 22.8 04/25/2014   CREATININE 0.53 05/08/2014   CREATININE 0.8 04/25/2014   PROT 6.2 05/02/2014   PROT 6.5 04/25/2014   ALBUMIN 2.6* 05/02/2014   ALBUMIN 2.9* 04/25/2014   BILITOT 0.5 05/02/2014   BILITOT 0.50 04/25/2014   ALKPHOS 72 05/02/2014   ALKPHOS 86 04/25/2014   AST 25 05/02/2014   AST 24 04/25/2014   ALT 14 05/02/2014   ALT 15 04/25/2014  .  Micro Results No results found for this or any previous visit (from the past 240 hour(s)).   Discharge Instructions     Patient is comfort care, nutrition, activity, as per hospice home protocol.   Discharge Medications     Medication List    STOP taking these medications        allopurinol 300 MG tablet  Commonly known as:  ZYLOPRIM     aspirin 325 MG tablet     clotrimazole-betamethasone cream  Commonly known as:  LOTRISONE     diltiazem 120 MG 24 hr capsule  Commonly known as:   CARDIZEM CD     DSS 100 MG Caps     feeding supplement (ENSURE COMPLETE) Liqd     fluconazole 100 MG tablet  Commonly known as:  DIFLUCAN     glucose blood test strip     insulin aspart 100 UNIT/ML injection  Commonly known as:  novoLOG     lidocaine-prilocaine cream  Commonly known as:  EMLA     loratadine 10 MG tablet  Commonly known as:  CLARITIN     metoprolol 50 MG tablet  Commonly known as:  LOPRESSOR     miconazole 2 % cream  Commonly known as:  MICATIN     naproxen 375 MG tablet  Commonly known as:  NAPROSYN     nystatin 100000 UNIT/ML suspension  Commonly known as:  MYCOSTATIN     ondansetron 8 MG tablet  Commonly known as:  ZOFRAN  Replaced by:  ondansetron 4 MG/2ML Soln injection     polyethylene glycol packet  Commonly known as:  MIRALAX / GLYCOLAX     predniSONE 10 MG tablet  Commonly known as:  DELTASONE     predniSONE 20 MG tablet  Commonly known as:  DELTASONE     simvastatin 20 MG tablet  Commonly known as:  ZOCOR     sucralfate 1 GM/10ML suspension  Commonly known as:  CARAFATE     traMADol 50 MG tablet  Commonly known as:  ULTRAM      TAKE these medications        acetaminophen 650 MG suppository  Commonly known as:  TYLENOL  Place 1 suppository (650 mg total) rectally every 6 (six) hours as needed for mild pain (or Fever >/= 101).     antiseptic oral rinse 0.05 % Liqd solution  Commonly known as:  CPC / CETYLPYRIDINIUM CHLORIDE 0.05%  7 mLs by Mouth Rinse route 2 times daily at 12 noon and 4 pm.     atropine 1 % ophthalmic solution  Place 4 drops under the tongue every 4 (four) hours as needed (excessive secretions).     chlorhexidine 0.12 % solution  Commonly known as:  PERIDEX  15  mLs by Mouth Rinse route 2 (two) times daily.     clorazepate 7.5 MG tablet  Commonly known as:  TRANXENE  Take 1 tablet (7.5 mg total) by mouth 2 (two) times daily as needed (anxiety).     diphenhydrAMINE 50 MG/ML injection  Commonly known  as:  BENADRYL  Inject 0.25 mLs (12.5 mg total) into the muscle every 4 (four) hours as needed for itching.     fentaNYL 0.05 MG/ML injection  Commonly known as:  SUBLIMAZE  Inject 0.5 mLs (25 mcg total) into the muscle every hour as needed for severe pain (or dyspnea).     haloperidol 2 MG/ML solution  Commonly known as:  HALDOL  Take 0.5 mLs (1 mg total) by mouth every 6 (six) hours as needed for agitation (or delirium).     LORazepam 2 MG/ML concentrated solution  Commonly known as:  ATIVAN  Place 0.5 mLs (1 mg total) under the tongue every 2 (two) hours as needed for anxiety.     ondansetron 4 MG/2ML Soln injection  Commonly known as:  ZOFRAN  Inject 2 mLs (4 mg total) into the muscle every 6 (six) hours as needed for nausea.     polyvinyl alcohol 1.4 % ophthalmic solution  Commonly known as:  LIQUIFILM TEARS  Place 1 drop into both eyes 4 (four) times daily as needed for dry eyes.     scopolamine 1 MG/3DAYS  Commonly known as:  TRANSDERM-SCOP  Place 1 patch (1.5 mg total) onto the skin every 3 (three) days.         Total Time in preparing paper work, data evaluation and todays exam - 35 minutes  Annalisia Ingber M.D on 05/10/2014 at 3:11 PM  [place]  [phone]

## 2014-05-10 NOTE — Progress Notes (Signed)
Pt experiencing hallucinations and attempting to get out of bed. Consoled pt and family, repositioned patient for comfort. Paged on call NP for change of route for anti-anxiety medicine from PO to IV since patient is NPO and no longer has feeding tube.

## 2014-05-10 NOTE — Progress Notes (Signed)
Noted comfort care as outlines. We will sign off at this time.[phone]

## 2014-05-10 NOTE — Progress Notes (Signed)
Asked to re-engage family per their request. When they spoke with Oncology, they decided to focus on comfort care.  He has not been eating, continued decline, and they worry about being able to care for him at home. With his DLBCL and decline here as above, it seems he would be candidate for residential hospice placement.  They would need to evaluate his eligibility and I have talked to SW about arranging this per family request. They specifically request hospice in [place].  If there are more specific questions about his care or symptom management, I would be happy to assist.   No Charge  Doran Clay D.O. Palliative Medicine Team at [facility]  Pager: [phone] Team Phone: [phone]

## 2014-05-13 LAB — STRIATED MUSCLE ANTIBODY: Striated Muscle Ab: <1:40 {titer}

## 2014-05-14 LAB — MYASTHENIA GRAVIS PANEL 2
ACETYLCHOLINE BINDING AB: 6.98 nmol/L — AB
ACETYLCHOLINE MODULAT AB: 72 % — AB
Acetylchol Block Ab: <15 % of inhibition (ref <15)

## 2014-05-16 ENCOUNTER — Ambulatory Visit: Payer: Medicare Other

## 2014-05-16 DEATH — deceased

## 2014-05-17 ENCOUNTER — Telehealth: Payer: Self-pay | Admitting: Nurse Practitioner

## 2014-05-17 NOTE — Telephone Encounter (Signed)
per HF to move pt appt-cld & spoke to son and he adv pt was deceased-CX ALL appts

## 2014-05-18 ENCOUNTER — Ambulatory Visit (HOSPITAL_COMMUNITY): Payer: Medicare Other

## 2014-05-19 ENCOUNTER — Other Ambulatory Visit: Payer: Self-pay | Admitting: Oncology

## 2014-05-25 ENCOUNTER — Ambulatory Visit: Payer: Medicare Other | Admitting: Nurse Practitioner

## 2014-05-25 ENCOUNTER — Other Ambulatory Visit: Payer: Medicare Other

## 2014-05-29 ENCOUNTER — Other Ambulatory Visit: Payer: Self-pay | Admitting: Oncology

## 2014-06-06 ENCOUNTER — Ambulatory Visit: Payer: Medicare Other

## 2014-07-06 ENCOUNTER — Other Ambulatory Visit: Payer: Self-pay | Admitting: Nurse Practitioner

## 2015-04-02 NOTE — Progress Notes (Signed)
This encounter was created in error - please disregard.

## 2016-08-12 IMAGING — CT NM PET TUM IMG RESTAG (PS) SKULL BASE T - THIGH
8 series · 25 of 25 positions shown · non-contrast
Comparison: CT chest dated 01/05/2014. CT abdomen pelvis dated
12/13/2013.

CLINICAL DATA: Subsequent treatment strategy for diffuse large
B-cell lymphoma.

EXAM:
NUCLEAR MEDICINE PET SKULL BASE TO THIGH
TECHNIQUE: 8.1 mCi F-18 FDG was injected intravenously. Full-ring PET imaging
was performed from the skull base to thigh after the radiotracer. CT
data was obtained and used for attenuation correction and anatomic
localization.
FASTING BLOOD GLUCOSE:  Value: 127 mg/dl

[Series 3: pet sk_thigh ac · axial · 5.0mm · 4.07mm/px · z∈[-562,+350]mm · 4 of 229 slices shown]
[im 1/229]
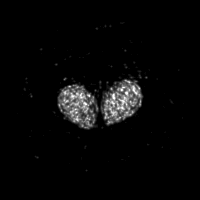
[im 77/229]
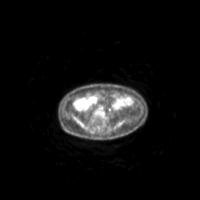
[im 153/229]
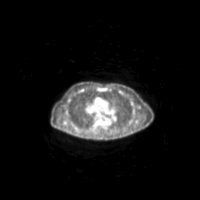
[im 229/229]
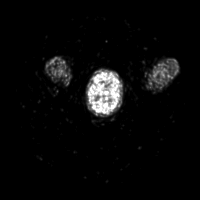

[Series 4: ct sk_thigh 5.0 hd_fov · axial · 5.0mm · 1.07mm/px · z∈[-562,+350]mm · 5 of 229 slices shown]
[im 1/229]
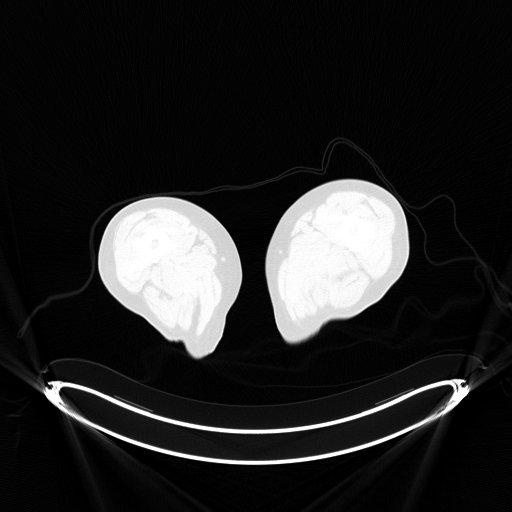
[im 58/229]
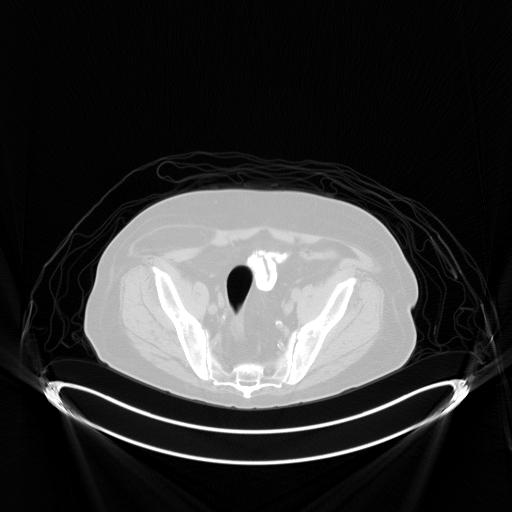
[im 115/229]
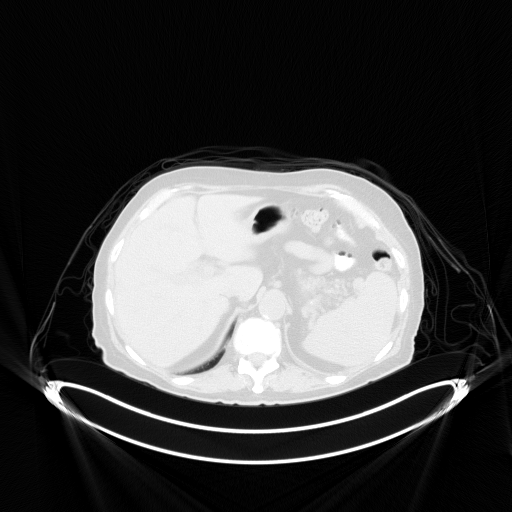
[im 172/229]
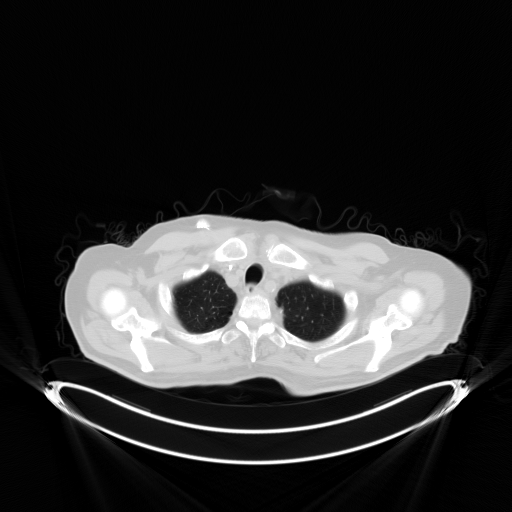
[im 229/229  brain]
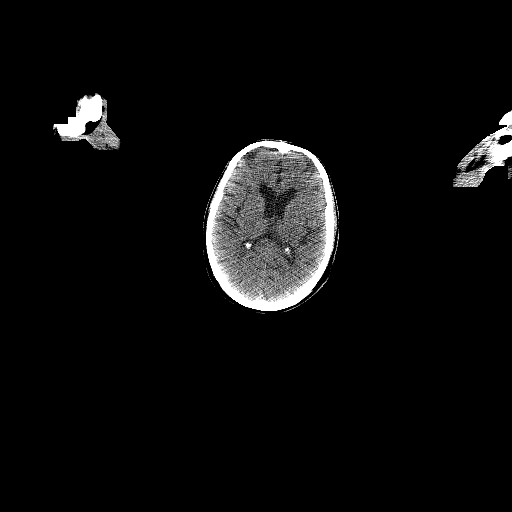

[Series 6: ct sk_thigh 5.0 b70f (id)_bone · axial · 5.0mm · 0.73mm/px · z∈[-124,+176]mm · 2 of 76 slices shown]
[im 1/76  bone]
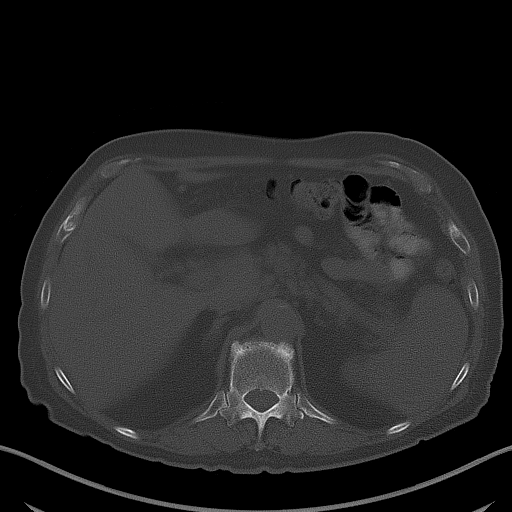
[im 76/76  bone]
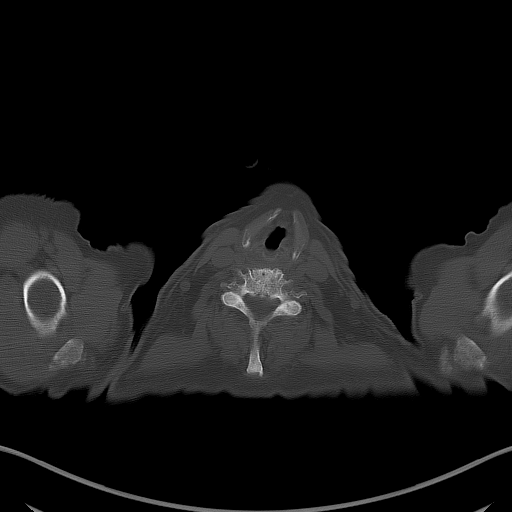

[Series 8: pet sk_thigh nac · axial · 5.0mm · 4.07mm/px · z∈[-562,+350]mm · 5 of 229 slices shown]
[im 1/229]
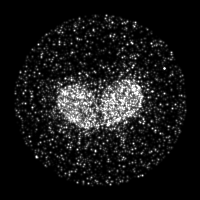
[im 58/229]
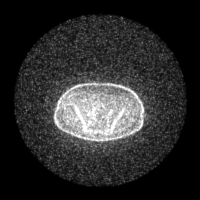
[im 115/229]
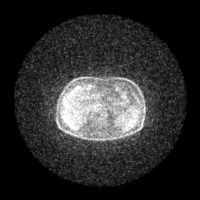
[im 172/229]
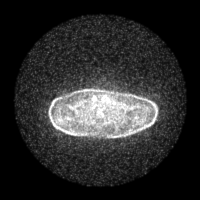
[im 229/229]
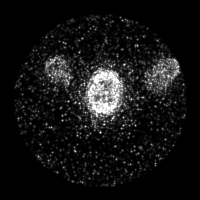

[Series 604: mip collection<mip range> · coronal · 1.89mm/px · 1 of 32 slices shown]
[im 1/32]
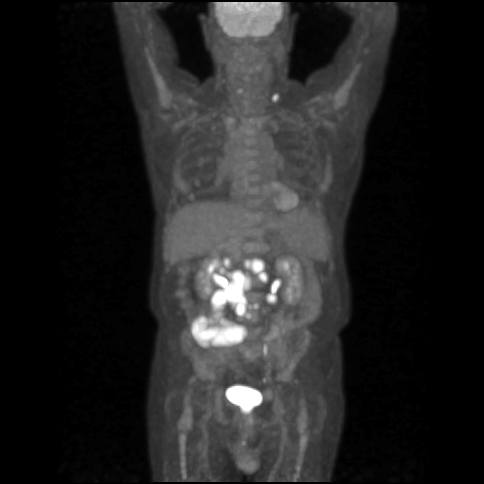

[Series 605: range-ct sk_thigh 5.0 hd_fov-cor-<alpha range> · 2 of 80 slices shown]
[im 1/80]
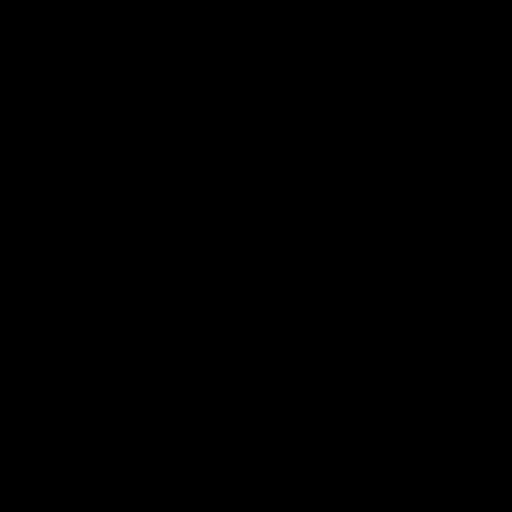
[im 80/80]
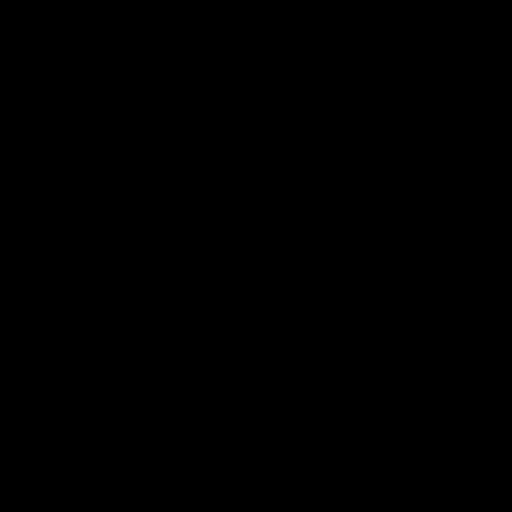

[Series 606: range-ct sk_thigh 5.0 hd_fov-tra-<alpha range> · 5 of 224 slices shown]
[im 1/224]
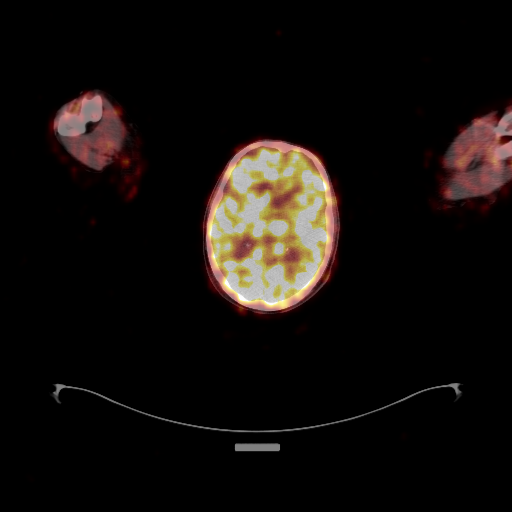
[im 56/224]
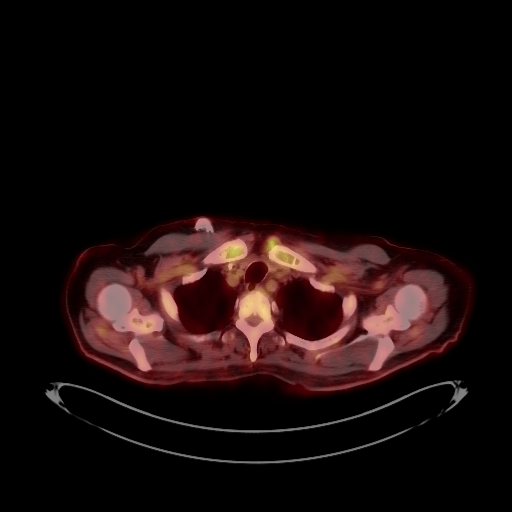
[im 112/224]
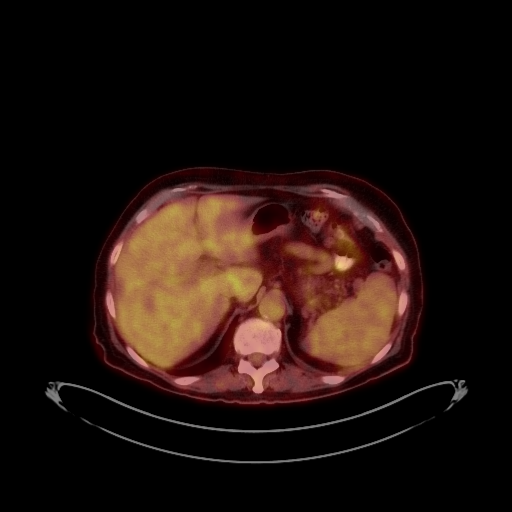
[im 168/224]
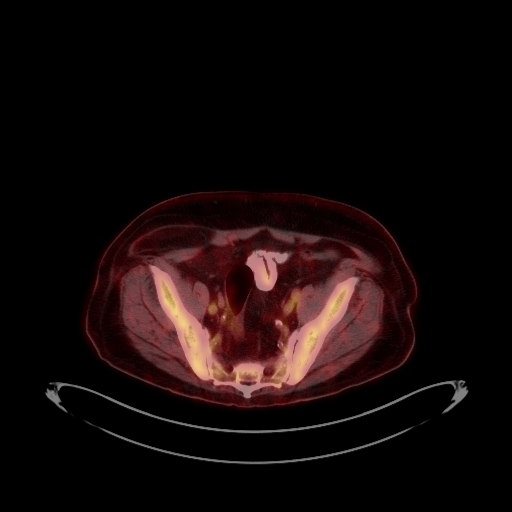
[im 224/224]
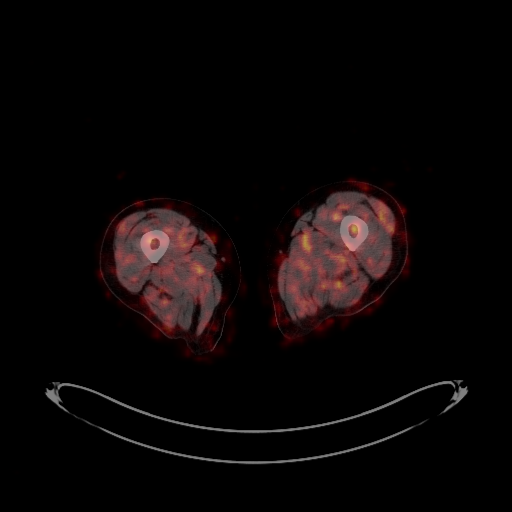

[Series 1032: results mm oncology reading · 1.10mm/px · 1 of 8 slices shown]
[im 1/8]
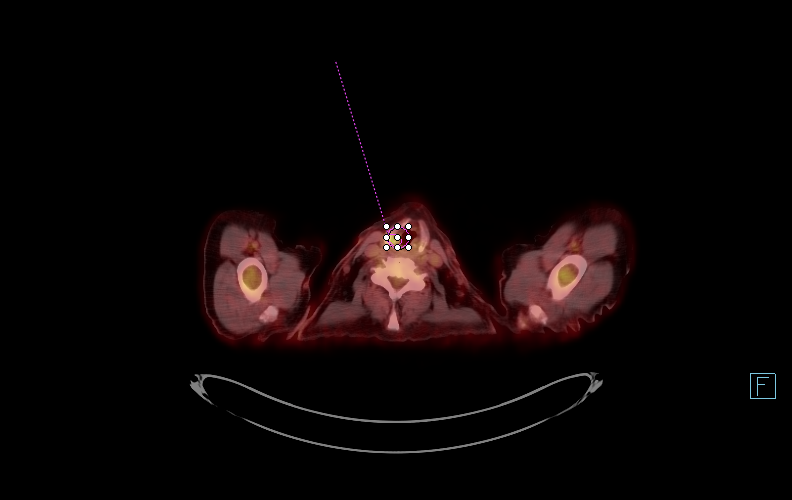

[25 of 25 positions shown; findings below may reference images not displayed]

FINDINGS: NECK

Mild focal hypermetabolism above the level of the right vocal cord,
max SUV 4.5 (PET image 46), indeterminate. No corresponding mass on
CT.

11 mm short axis left supraclavicular node (series 4/image 47),
previously 15 mm, max SUV 10.9.

CHEST

No suspicious pulmonary nodules.

Residual 12 mm subcarinal node (series 4/image 80), non FDG avid.
Prior mediastinal lymphadenopathy is otherwise resolved.

No hypermetabolic mediastinal or hilar lymphadenopathy.

Coronary atherosclerosis.  Right chest port.

ABDOMEN/PELVIS

No abnormal hypermetabolic activity within the liver, pancreas,
adrenal glands, or spleen.

Wall thickening/aneurysmal dilatation of the terminal ileum (series
4/ image 160), with associated hypermetabolism (max SUV 21.8),
compatible with lymphomatous involvement.

Extensive hypermetabolic lymphadenopathy in the
abdomen/retroperitoneum, improved, including:

--1.5 cm short axis portacaval node (series 4/ image 126), max SUV
12.6, previously 3.1 cm

--10 mm short axis retrocaval node (series 4/ image 133), max SUV
9.3, unchanged

--3.1 cm jejunal mesenteric node (series 4/image 440), max SUV 19.8,
previously 3.2 cm

Vascular calcifications. Moderate fat containing right inguinal
hernia. Bladder is mildly thick-walled but underdistended.

Status post prostatectomy

SKELETON

Mild wall thickening involving the sternum and medial
clavicles/sternoclavicular junctions, max SUV 4.2. This appearance
may be degenerative, but lymphomatous involvement is not excluded.

Otherwise, heterogeneous marrow uptake without findings suspicious
for osseous metastases.
IMPRESSION: Lymphomatous involvement of the terminal ileum.

Upper abdominal/ retroperitoneal nodal metastases, mildly improved.

Left supraclavicular node. Thoracic lymphadenopathy has otherwise
resolved.

Mild hypermetabolism involving the sternum, possibly degenerative,
lymphomatous involvement not entirely excluded.

## 2017-06-23 NOTE — Progress Notes (Signed)
This encounter was created in error - please disregard.
